# Patient Record
Sex: Female | Born: 1935
Health system: Southern US, Community
[De-identification: ages and names within clinical notes are randomized; demographics above are authoritative.]

## PROBLEM LIST (undated history)

## (undated) DIAGNOSIS — F419 Anxiety disorder, unspecified: Secondary | ICD-10-CM

## (undated) DIAGNOSIS — R112 Nausea with vomiting, unspecified: Secondary | ICD-10-CM

## (undated) DIAGNOSIS — F32A Depression, unspecified: Secondary | ICD-10-CM

## (undated) DIAGNOSIS — C50412 Malignant neoplasm of upper-outer quadrant of left female breast: Secondary | ICD-10-CM

## (undated) DIAGNOSIS — R269 Unspecified abnormalities of gait and mobility: Secondary | ICD-10-CM

## (undated) DIAGNOSIS — E785 Hyperlipidemia, unspecified: Secondary | ICD-10-CM

## (undated) DIAGNOSIS — T4145XA Adverse effect of unspecified anesthetic, initial encounter: Secondary | ICD-10-CM

## (undated) DIAGNOSIS — T8859XA Other complications of anesthesia, initial encounter: Secondary | ICD-10-CM

## (undated) DIAGNOSIS — M1990 Unspecified osteoarthritis, unspecified site: Secondary | ICD-10-CM

## (undated) DIAGNOSIS — T7840XA Allergy, unspecified, initial encounter: Secondary | ICD-10-CM

## (undated) DIAGNOSIS — R911 Solitary pulmonary nodule: Secondary | ICD-10-CM

## (undated) DIAGNOSIS — Z9889 Other specified postprocedural states: Secondary | ICD-10-CM

## (undated) DIAGNOSIS — K219 Gastro-esophageal reflux disease without esophagitis: Secondary | ICD-10-CM

## (undated) DIAGNOSIS — I480 Paroxysmal atrial fibrillation: Secondary | ICD-10-CM

## (undated) DIAGNOSIS — H269 Unspecified cataract: Secondary | ICD-10-CM

## (undated) DIAGNOSIS — Z87442 Personal history of urinary calculi: Secondary | ICD-10-CM

## (undated) DIAGNOSIS — K5790 Diverticulosis of intestine, part unspecified, without perforation or abscess without bleeding: Secondary | ICD-10-CM

## (undated) DIAGNOSIS — F329 Major depressive disorder, single episode, unspecified: Secondary | ICD-10-CM

## (undated) HISTORY — PX: ABDOMINAL HYSTERECTOMY: SHX81

## (undated) HISTORY — DX: Hyperlipidemia, unspecified: E78.5

## (undated) HISTORY — PX: OTHER SURGICAL HISTORY: SHX169

## (undated) HISTORY — DX: Paroxysmal atrial fibrillation: I48.0

## (undated) HISTORY — DX: Unspecified cataract: H26.9

## (undated) HISTORY — DX: Major depressive disorder, single episode, unspecified: F32.9

## (undated) HISTORY — DX: Depression, unspecified: F32.A

## (undated) HISTORY — PX: HERNIA REPAIR: SHX51

## (undated) HISTORY — PX: APPENDECTOMY: SHX54

## (undated) HISTORY — DX: Malignant neoplasm of upper-outer quadrant of left female breast: C50.412

## (undated) HISTORY — DX: Anxiety disorder, unspecified: F41.9

## (undated) HISTORY — DX: Diverticulosis of intestine, part unspecified, without perforation or abscess without bleeding: K57.90

## (undated) HISTORY — DX: Gastro-esophageal reflux disease without esophagitis: K21.9

## (undated) HISTORY — DX: Solitary pulmonary nodule: R91.1

## (undated) HISTORY — DX: Allergy, unspecified, initial encounter: T78.40XA

## (undated) HISTORY — PX: ESOPHAGOGASTRODUODENOSCOPY: SHX1529

## (undated) HISTORY — PX: TONSILLECTOMY: SUR1361

## (undated) HISTORY — PX: COLONOSCOPY W/ POLYPECTOMY: SHX1380

## (undated) HISTORY — DX: Unspecified abnormalities of gait and mobility: R26.9

---

## 2001-06-01 ENCOUNTER — Encounter (INDEPENDENT_AMBULATORY_CARE_PROVIDER_SITE_OTHER): Payer: Self-pay | Admitting: Specialist

## 2001-06-01 ENCOUNTER — Other Ambulatory Visit: Admission: RE | Admit: 2001-06-01 | Discharge: 2001-06-01 | Payer: Self-pay | Admitting: Gastroenterology

## 2001-06-02 ENCOUNTER — Other Ambulatory Visit: Admission: RE | Admit: 2001-06-02 | Discharge: 2001-06-02 | Payer: Self-pay | Admitting: Obstetrics and Gynecology

## 2001-06-08 ENCOUNTER — Ambulatory Visit (HOSPITAL_COMMUNITY): Admission: RE | Admit: 2001-06-08 | Discharge: 2001-06-08 | Payer: Self-pay | Admitting: Pulmonary Disease

## 2001-07-09 ENCOUNTER — Inpatient Hospital Stay (HOSPITAL_COMMUNITY): Admission: AD | Admit: 2001-07-09 | Discharge: 2001-07-11 | Payer: Self-pay | Admitting: Obstetrics and Gynecology

## 2001-12-23 HISTORY — PX: ORIF ANKLE FRACTURE: SUR919

## 2001-12-23 HISTORY — PX: ANKLE SURGERY: SHX546

## 2002-03-09 ENCOUNTER — Ambulatory Visit (HOSPITAL_COMMUNITY): Admission: RE | Admit: 2002-03-09 | Discharge: 2002-03-09 | Payer: Self-pay | Admitting: Pulmonary Disease

## 2002-08-03 ENCOUNTER — Ambulatory Visit (HOSPITAL_COMMUNITY): Admission: RE | Admit: 2002-08-03 | Discharge: 2002-08-03 | Payer: Self-pay | Admitting: Cardiology

## 2002-08-03 ENCOUNTER — Encounter: Payer: Self-pay | Admitting: Cardiology

## 2002-09-20 ENCOUNTER — Emergency Department (HOSPITAL_COMMUNITY): Admission: EM | Admit: 2002-09-20 | Discharge: 2002-09-20 | Payer: Self-pay

## 2002-09-21 ENCOUNTER — Inpatient Hospital Stay (HOSPITAL_COMMUNITY): Admission: AD | Admit: 2002-09-21 | Discharge: 2002-10-01 | Payer: Self-pay | Admitting: Orthopedic Surgery

## 2002-09-25 ENCOUNTER — Encounter: Payer: Self-pay | Admitting: Orthopedic Surgery

## 2002-09-29 ENCOUNTER — Encounter: Payer: Self-pay | Admitting: Orthopedic Surgery

## 2002-11-08 ENCOUNTER — Encounter (HOSPITAL_COMMUNITY): Admission: RE | Admit: 2002-11-08 | Discharge: 2002-12-08 | Payer: Self-pay | Admitting: Orthopedic Surgery

## 2004-10-23 ENCOUNTER — Ambulatory Visit (HOSPITAL_COMMUNITY): Admission: RE | Admit: 2004-10-23 | Discharge: 2004-10-23 | Payer: Self-pay | Admitting: Pulmonary Disease

## 2005-04-03 ENCOUNTER — Ambulatory Visit: Payer: Self-pay | Admitting: Internal Medicine

## 2005-04-15 ENCOUNTER — Ambulatory Visit (HOSPITAL_COMMUNITY): Admission: RE | Admit: 2005-04-15 | Discharge: 2005-04-15 | Payer: Self-pay | Admitting: Pulmonary Disease

## 2005-05-02 ENCOUNTER — Ambulatory Visit (HOSPITAL_COMMUNITY): Admission: RE | Admit: 2005-05-02 | Discharge: 2005-05-02 | Payer: Self-pay | Admitting: Family Medicine

## 2005-05-16 ENCOUNTER — Ambulatory Visit: Payer: Self-pay | Admitting: Internal Medicine

## 2005-06-03 ENCOUNTER — Ambulatory Visit: Payer: Self-pay | Admitting: *Deleted

## 2005-06-05 ENCOUNTER — Ambulatory Visit: Payer: Self-pay | Admitting: *Deleted

## 2005-06-05 ENCOUNTER — Ambulatory Visit (HOSPITAL_COMMUNITY): Admission: RE | Admit: 2005-06-05 | Discharge: 2005-06-05 | Payer: Self-pay | Admitting: *Deleted

## 2005-06-07 ENCOUNTER — Ambulatory Visit: Payer: Self-pay | Admitting: *Deleted

## 2005-06-17 ENCOUNTER — Ambulatory Visit: Payer: Self-pay | Admitting: *Deleted

## 2005-06-17 ENCOUNTER — Observation Stay (HOSPITAL_COMMUNITY): Admission: AD | Admit: 2005-06-17 | Discharge: 2005-06-18 | Payer: Self-pay | Admitting: Pulmonary Disease

## 2005-06-17 ENCOUNTER — Encounter (HOSPITAL_COMMUNITY): Admission: RE | Admit: 2005-06-17 | Discharge: 2005-06-19 | Payer: Self-pay | Admitting: *Deleted

## 2005-06-21 ENCOUNTER — Ambulatory Visit: Payer: Self-pay | Admitting: *Deleted

## 2005-06-26 ENCOUNTER — Ambulatory Visit: Payer: Self-pay | Admitting: Cardiology

## 2005-07-03 ENCOUNTER — Ambulatory Visit: Payer: Self-pay | Admitting: *Deleted

## 2005-07-12 ENCOUNTER — Ambulatory Visit: Payer: Self-pay | Admitting: Cardiology

## 2005-07-24 ENCOUNTER — Ambulatory Visit: Payer: Self-pay | Admitting: *Deleted

## 2005-08-07 ENCOUNTER — Ambulatory Visit: Payer: Self-pay | Admitting: *Deleted

## 2005-08-23 ENCOUNTER — Ambulatory Visit: Payer: Self-pay | Admitting: *Deleted

## 2005-08-27 ENCOUNTER — Ambulatory Visit (HOSPITAL_COMMUNITY): Admission: RE | Admit: 2005-08-27 | Discharge: 2005-08-27 | Payer: Self-pay | Admitting: *Deleted

## 2005-08-29 ENCOUNTER — Ambulatory Visit (HOSPITAL_COMMUNITY): Admission: RE | Admit: 2005-08-29 | Discharge: 2005-08-29 | Payer: Self-pay | Admitting: Cardiovascular Disease

## 2005-08-30 ENCOUNTER — Ambulatory Visit: Payer: Self-pay | Admitting: Cardiology

## 2005-09-11 ENCOUNTER — Ambulatory Visit: Payer: Self-pay | Admitting: *Deleted

## 2005-10-04 ENCOUNTER — Ambulatory Visit: Payer: Self-pay | Admitting: Cardiovascular Disease

## 2005-11-01 ENCOUNTER — Ambulatory Visit: Payer: Self-pay | Admitting: Cardiology

## 2005-11-18 ENCOUNTER — Ambulatory Visit: Payer: Self-pay | Admitting: *Deleted

## 2005-11-29 ENCOUNTER — Ambulatory Visit: Payer: Self-pay | Admitting: Cardiology

## 2005-12-31 ENCOUNTER — Ambulatory Visit: Payer: Self-pay | Admitting: Cardiology

## 2006-02-04 ENCOUNTER — Ambulatory Visit: Payer: Self-pay | Admitting: *Deleted

## 2006-03-03 ENCOUNTER — Ambulatory Visit: Payer: Self-pay | Admitting: Cardiology

## 2006-03-10 ENCOUNTER — Ambulatory Visit: Payer: Self-pay | Admitting: Internal Medicine

## 2006-04-04 ENCOUNTER — Ambulatory Visit: Payer: Self-pay | Admitting: Cardiology

## 2006-04-17 ENCOUNTER — Ambulatory Visit: Payer: Self-pay | Admitting: Cardiology

## 2006-05-14 ENCOUNTER — Ambulatory Visit: Payer: Self-pay | Admitting: *Deleted

## 2006-06-16 ENCOUNTER — Ambulatory Visit: Payer: Self-pay | Admitting: *Deleted

## 2006-06-28 IMAGING — CR DG TIBIA/FIBULA 2V*R*
3 series · 3 of 3 positions shown · non-contrast
Comparison: none

CLINICAL DATA: 76-year-old with bilateral leg pain.
 TWO VIEW LEFT TIBIA/FIBULA:
 There is no evidence of fracture or dislocation. No other significant bone or soft tissue abnormalities are identified.

[view not recorded (1 of 3)]
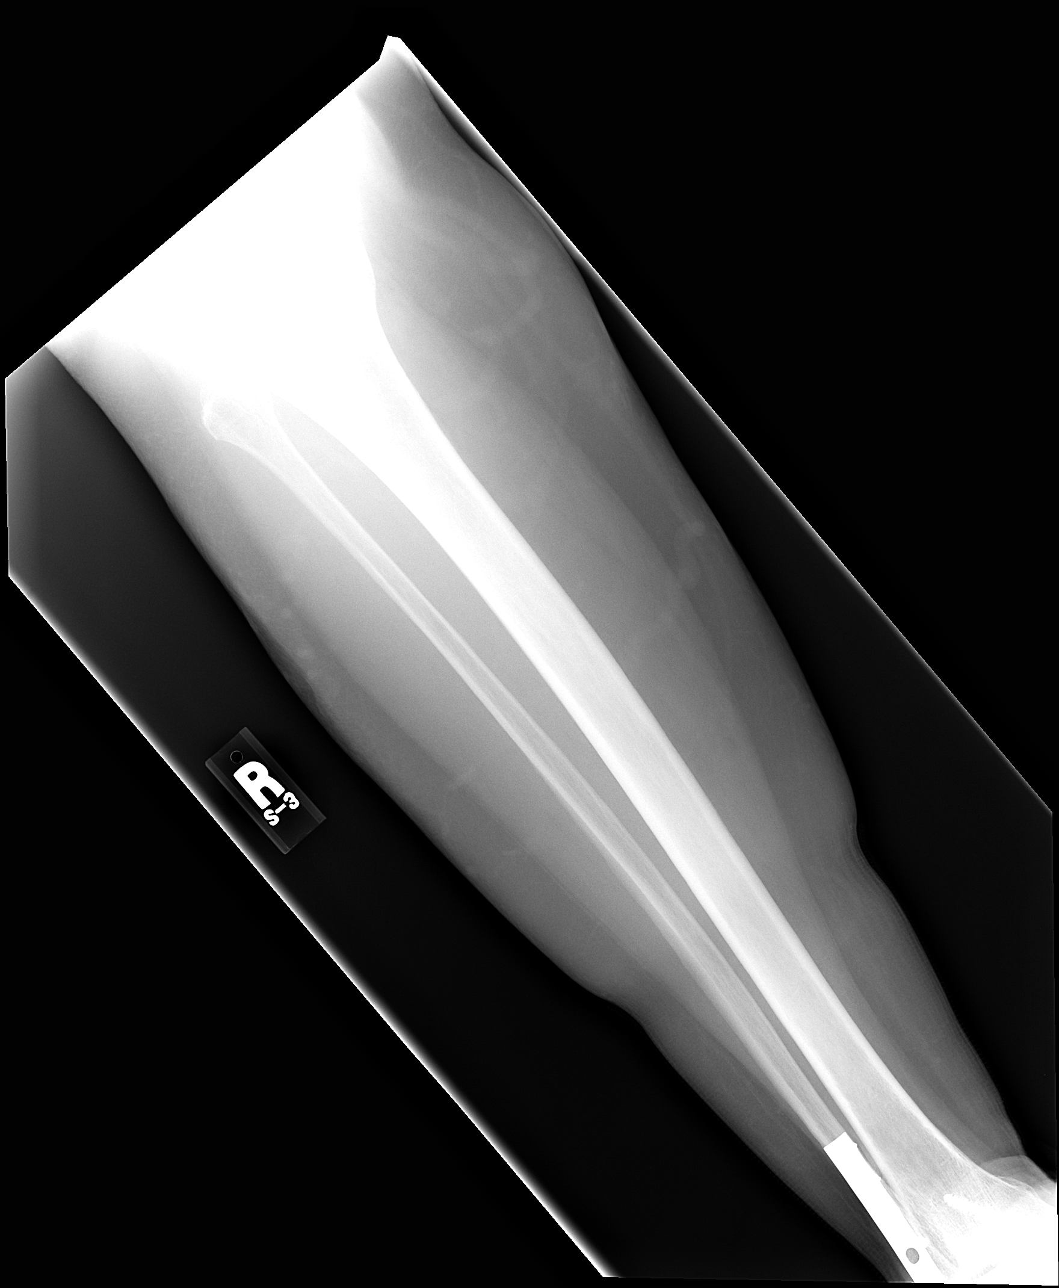

[view not recorded (2 of 3)]
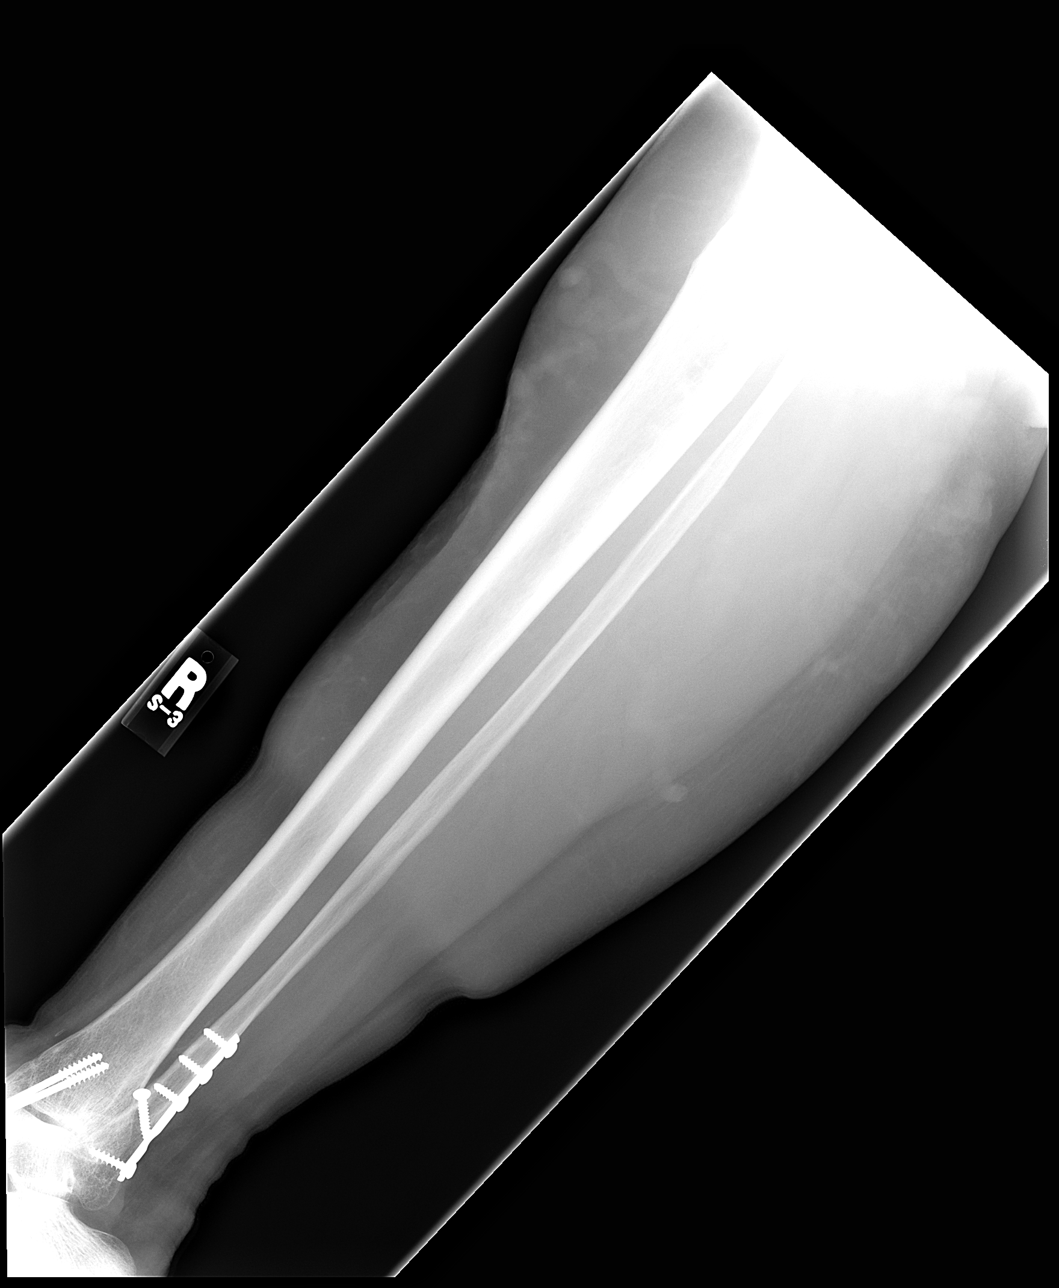

[view not recorded (3 of 3)]
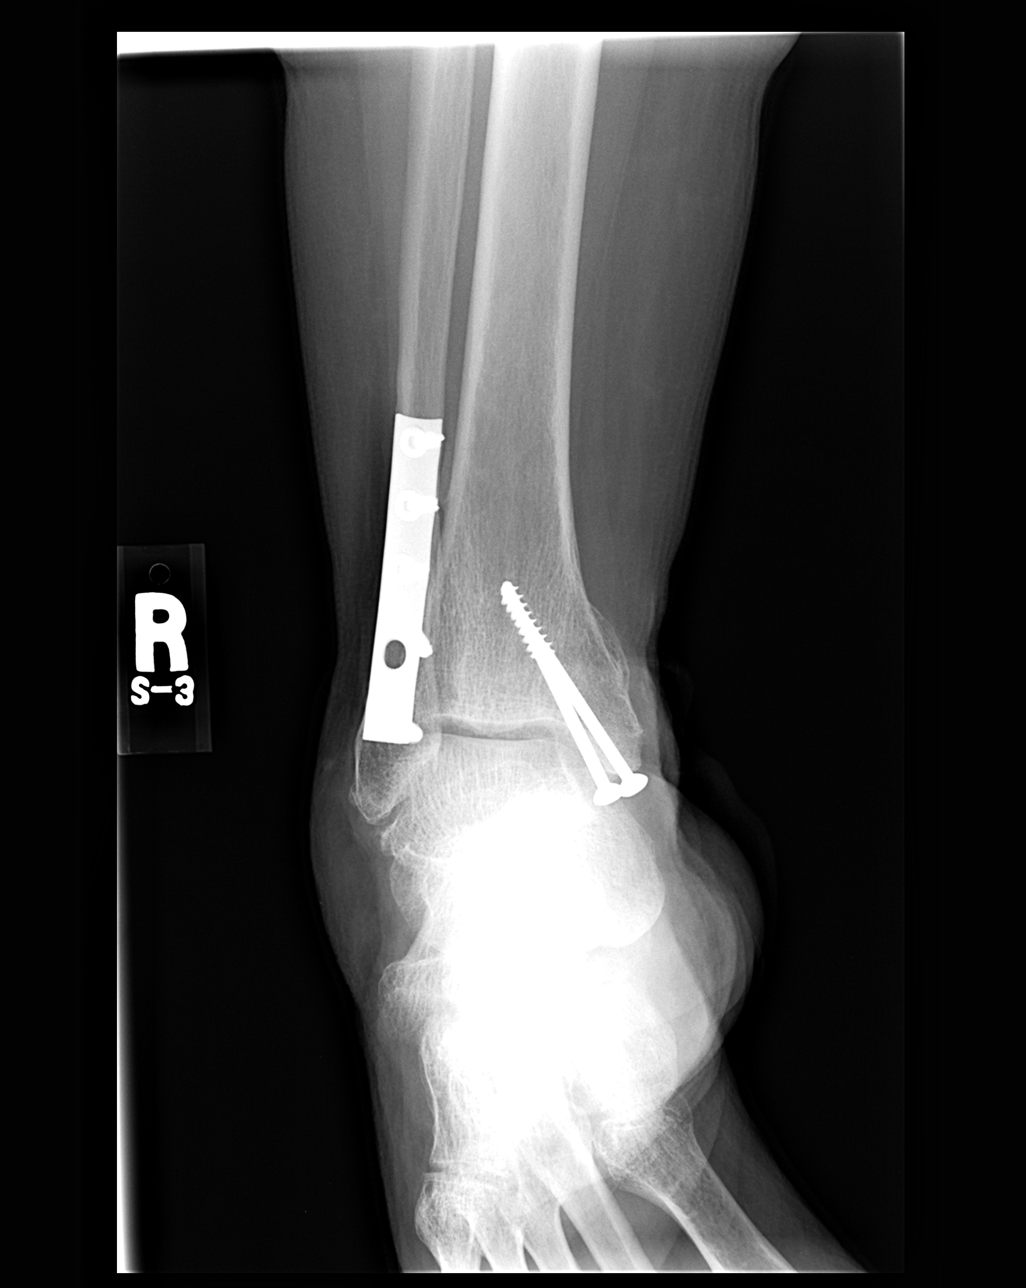

[3 of 3 positions shown; findings below may reference images not displayed]

IMPRESSION: Normal study.

 FOUR VIEW LEFT KNEE:
 Mild osteopenic changes.  There are also mild degenerative changes.  No acute bony findings or destructive bony lesions.  No joint effusion.
IMPRESSION: Mild osteopenia and mild degenerative changes but no acute bony findings.  
 TWO VIEW RIGHT TIBIA/FIBULA:
 No acute bony findings.  There is surgical hardware transfixing remote ankle fractures.
IMPRESSION: No acute bony findings.  
 FOUR VIEW RIGHT KNEE:
 Osteopenic changes.  Mild degenerative changes with medial joint space narrowing.  There is also patellofemoral joint degenerative changes.  No joint effusion and no acute bony findings.
IMPRESSION: Osteopenic changes and mild degenerative changes but no acute bony findings.

## 2006-07-21 ENCOUNTER — Ambulatory Visit: Payer: Self-pay | Admitting: *Deleted

## 2006-08-22 ENCOUNTER — Ambulatory Visit: Payer: Self-pay | Admitting: *Deleted

## 2006-09-05 ENCOUNTER — Ambulatory Visit: Payer: Self-pay | Admitting: Cardiology

## 2006-09-11 ENCOUNTER — Ambulatory Visit: Payer: Self-pay | Admitting: Cardiology

## 2006-09-11 ENCOUNTER — Ambulatory Visit (HOSPITAL_COMMUNITY): Admission: RE | Admit: 2006-09-11 | Discharge: 2006-09-11 | Payer: Self-pay | Admitting: Cardiology

## 2006-09-18 ENCOUNTER — Ambulatory Visit: Payer: Self-pay | Admitting: Cardiology

## 2006-10-20 ENCOUNTER — Ambulatory Visit: Payer: Self-pay | Admitting: Cardiology

## 2006-11-19 ENCOUNTER — Ambulatory Visit: Payer: Self-pay | Admitting: Cardiology

## 2006-12-18 ENCOUNTER — Ambulatory Visit: Payer: Self-pay | Admitting: Cardiology

## 2006-12-30 ENCOUNTER — Ambulatory Visit: Payer: Self-pay | Admitting: Internal Medicine

## 2007-01-01 ENCOUNTER — Ambulatory Visit (HOSPITAL_COMMUNITY): Admission: RE | Admit: 2007-01-01 | Discharge: 2007-01-01 | Payer: Self-pay | Admitting: Internal Medicine

## 2007-01-15 ENCOUNTER — Ambulatory Visit: Payer: Self-pay | Admitting: Cardiology

## 2007-02-20 ENCOUNTER — Emergency Department (HOSPITAL_COMMUNITY): Admission: EM | Admit: 2007-02-20 | Discharge: 2007-02-20 | Payer: Self-pay | Admitting: Emergency Medicine

## 2007-02-20 ENCOUNTER — Ambulatory Visit: Payer: Self-pay | Admitting: Cardiology

## 2007-03-11 ENCOUNTER — Ambulatory Visit: Payer: Self-pay | Admitting: Cardiology

## 2007-04-13 ENCOUNTER — Ambulatory Visit: Payer: Self-pay | Admitting: Cardiology

## 2007-04-17 ENCOUNTER — Ambulatory Visit (HOSPITAL_COMMUNITY): Admission: RE | Admit: 2007-04-17 | Discharge: 2007-04-17 | Payer: Self-pay | Admitting: Pulmonary Disease

## 2007-05-12 ENCOUNTER — Ambulatory Visit: Payer: Self-pay | Admitting: Cardiology

## 2007-06-12 ENCOUNTER — Ambulatory Visit: Payer: Self-pay | Admitting: Cardiovascular Disease

## 2007-07-10 ENCOUNTER — Ambulatory Visit: Payer: Self-pay | Admitting: Cardiology

## 2007-08-07 ENCOUNTER — Ambulatory Visit: Payer: Self-pay | Admitting: Internal Medicine

## 2007-08-21 ENCOUNTER — Ambulatory Visit: Payer: Self-pay | Admitting: Cardiology

## 2007-09-08 ENCOUNTER — Ambulatory Visit: Payer: Self-pay | Admitting: Cardiology

## 2007-10-12 ENCOUNTER — Ambulatory Visit: Payer: Self-pay | Admitting: Cardiology

## 2007-11-10 ENCOUNTER — Ambulatory Visit: Payer: Self-pay | Admitting: Cardiovascular Disease

## 2007-12-10 ENCOUNTER — Ambulatory Visit: Payer: Self-pay | Admitting: Cardiovascular Disease

## 2008-01-08 ENCOUNTER — Ambulatory Visit: Payer: Self-pay | Admitting: Internal Medicine

## 2008-02-11 ENCOUNTER — Ambulatory Visit: Payer: Self-pay | Admitting: Cardiovascular Disease

## 2008-03-11 ENCOUNTER — Ambulatory Visit: Payer: Self-pay | Admitting: Cardiology

## 2008-03-14 ENCOUNTER — Ambulatory Visit: Payer: Self-pay | Admitting: Internal Medicine

## 2008-03-14 ENCOUNTER — Ambulatory Visit (HOSPITAL_COMMUNITY): Admission: RE | Admit: 2008-03-14 | Discharge: 2008-03-14 | Payer: Self-pay | Admitting: Cardiology

## 2008-03-14 ENCOUNTER — Encounter: Payer: Self-pay | Admitting: Cardiology

## 2009-03-28 ENCOUNTER — Ambulatory Visit: Payer: Self-pay | Admitting: Vascular Surgery

## 2009-03-30 ENCOUNTER — Ambulatory Visit (HOSPITAL_COMMUNITY): Admission: RE | Admit: 2009-03-30 | Discharge: 2009-03-30 | Payer: Self-pay | Admitting: Urology

## 2009-04-10 ENCOUNTER — Ambulatory Visit (HOSPITAL_COMMUNITY): Admission: RE | Admit: 2009-04-10 | Discharge: 2009-04-10 | Payer: Self-pay | Admitting: Pulmonary Disease

## 2009-04-17 ENCOUNTER — Ambulatory Visit: Payer: Self-pay | Admitting: Vascular Surgery

## 2009-05-04 ENCOUNTER — Ambulatory Visit: Payer: Self-pay | Admitting: Cardiology

## 2009-10-26 ENCOUNTER — Ambulatory Visit (HOSPITAL_COMMUNITY): Admission: RE | Admit: 2009-10-26 | Discharge: 2009-10-26 | Payer: Self-pay | Admitting: Pulmonary Disease

## 2009-11-01 ENCOUNTER — Ambulatory Visit (HOSPITAL_COMMUNITY): Admission: RE | Admit: 2009-11-01 | Discharge: 2009-11-01 | Payer: Self-pay | Admitting: Pulmonary Disease

## 2010-03-16 ENCOUNTER — Encounter (INDEPENDENT_AMBULATORY_CARE_PROVIDER_SITE_OTHER): Payer: Self-pay

## 2010-05-26 ENCOUNTER — Encounter (INDEPENDENT_AMBULATORY_CARE_PROVIDER_SITE_OTHER): Payer: Self-pay | Admitting: *Deleted

## 2010-05-26 ENCOUNTER — Encounter: Payer: Self-pay | Admitting: Cardiology

## 2010-05-26 LAB — CONVERTED CEMR LAB
ALT: 18 units/L
ALT: 18 units/L (ref 0–35)
AST: 17 units/L
AST: 17 units/L (ref 0–37)
Albumin: 4.3 g/dL
Albumin: 4.3 g/dL (ref 3.5–5.2)
Alkaline Phosphatase: 71 units/L
Alkaline Phosphatase: 71 units/L (ref 39–117)
BUN: 22 mg/dL
BUN: 22 mg/dL (ref 6–23)
CO2: 26 meq/L
CO2: 26 meq/L (ref 19–32)
Calcium: 10.1 mg/dL
Calcium: 10.1 mg/dL (ref 8.4–10.5)
Chloride: 105 meq/L
Chloride: 105 meq/L (ref 96–112)
Cholesterol: 149 mg/dL
Cholesterol: 149 mg/dL (ref 0–200)
Creatinine, Ser: 0.78 mg/dL
Creatinine, Ser: 0.78 mg/dL (ref 0.40–1.20)
Glucose, Bld: 98 mg/dL
Glucose, Bld: 98 mg/dL (ref 70–99)
HDL: 48 mg/dL
HDL: 48 mg/dL (ref >39)
LDL Cholesterol: 17 mg/dL
LDL Cholesterol: 84 mg/dL (ref 0–99)
Potassium: 4 meq/L
Potassium: 4 meq/L (ref 3.5–5.3)
Sodium: 139 meq/L
Sodium: 139 meq/L (ref 135–145)
Total Bilirubin: 0.5 mg/dL (ref 0.3–1.2)
Total CHOL/HDL Ratio: 3.1
Total Protein: 6.9 g/dL
Total Protein: 6.9 g/dL (ref 6.0–8.3)
Triglycerides: 86 mg/dL
Triglycerides: 86 mg/dL (ref <150)
VLDL: 17 mg/dL (ref 0–40)

## 2010-06-08 ENCOUNTER — Encounter: Payer: Self-pay | Admitting: *Deleted

## 2010-06-15 ENCOUNTER — Ambulatory Visit: Payer: Self-pay | Admitting: Cardiology

## 2010-06-15 ENCOUNTER — Encounter: Payer: Self-pay | Admitting: Cardiology

## 2010-11-12 ENCOUNTER — Ambulatory Visit (HOSPITAL_COMMUNITY)
Admission: RE | Admit: 2010-11-12 | Discharge: 2010-11-12 | Payer: Self-pay | Source: Home / Self Care | Admitting: Pulmonary Disease

## 2010-12-06 ENCOUNTER — Ambulatory Visit: Payer: Self-pay | Admitting: Orthopedic Surgery

## 2010-12-10 ENCOUNTER — Encounter (HOSPITAL_COMMUNITY): Admission: RE | Admit: 2010-12-10 | Payer: Self-pay | Source: Home / Self Care | Admitting: Otolaryngology

## 2011-01-08 ENCOUNTER — Ambulatory Visit: Admit: 2011-01-08 | Payer: Self-pay | Admitting: Internal Medicine

## 2011-01-13 ENCOUNTER — Encounter: Payer: Self-pay | Admitting: *Deleted

## 2011-01-22 NOTE — Miscellaneous (Signed)
Summary: Lab orders; Lipids and CMP  Clinical Lists Changes  Problems: Added new problem of HYPERLIPIDEMIA (ICD-272.4) Added new problem of ATRIAL FIBRILLATION (ICD-427.31) Orders: Added new Test order of T-Lipid Profile [phone]) - Signed Added new Test order of T-Comprehensive Metabolic Panel [phone]) - Signed

## 2011-01-22 NOTE — Miscellaneous (Signed)
Summary: labs cmp,lipids,05/26/2010  Clinical Lists Changes  Observations: Added new observation of CALCIUM: 10.1 mg/dL (16/09/9603 5:40) Added new observation of ALBUMIN: 4.3 g/dL (98/10/9146 8:29) Added new observation of PROTEIN, TOT: 6.9 g/dL (56/21/3086 5:78) Added new observation of SGPT (ALT): 18 units/L (05/26/2010 8:42) Added new observation of SGOT (AST): 17 units/L (05/26/2010 8:42) Added new observation of ALK PHOS: 71 units/L (05/26/2010 8:42) Added new observation of CREATININE: 0.78 mg/dL (46/96/2952 8:41) Added new observation of BUN: 22 mg/dL (32/44/0102 7:25) Added new observation of BG RANDOM: 98 mg/dL (36/64/4034 7:42) Added new observation of CO2 PLSM/SER: 26 meq/L (05/26/2010 8:42) Added new observation of CL SERUM: 105 meq/L (05/26/2010 8:42) Added new observation of K SERUM: 4.0 meq/L (05/26/2010 8:42) Added new observation of NA: 139 meq/L (05/26/2010 8:42) Added new observation of LDL: 17 mg/dL (59/56/3875 6:43) Added new observation of HDL: 48 mg/dL (32/95/1884 1:66) Added new observation of TRIGLYC TOT: 86 mg/dL (06/21/1600 0:93) Added new observation of CHOLESTEROL: 149 mg/dL (23/55/7322 0:25)

## 2011-01-22 NOTE — Assessment & Plan Note (Signed)
Summary: 1 yr f/u per checkout on 05/04/09/tg  Medications Added ALPRAZOLAM 0.5 MG TABS (ALPRAZOLAM) as needed      Allergies Added: ! CODEINE ! MORPHINE  Visit Type:  Follow-up Primary Provider:  Dr. Kari Baars   History of Present Illness: [name] returns to the office as scheduled for continued assessment and treatment of a remote episode of atrial fibrillation, hyperlipidemia and generalized health concerns.  This nice woman has done extremely well since her last visit with no significant medical issues.  She is concerned about increased pedal edema.  Otherwise, she performs all of her housework and assists in the care of her husband without any cardiopulmonary symptoms.  She notes occasional brief palpitations.  She has not experienced any lightheadedness and certainly no syncope.   Current Medications (verified): 1)  Xalatan 0.005 % Soln (Latanoprost) .Marland Kitchen.. 1 Drop Each Eye At Bedtime 2)  Vytorin 10-20 Mg Tabs (Ezetimibe-Simvastatin) .... Take 1 Tab Qhs 3)  Aspirin 81 Mg Tbec (Aspirin) .... Take 1 Tab Daily 4)  Alprazolam 0.5 Mg Tabs (Alprazolam) .... As Needed  Allergies (verified): 1)  ! Codeine 2)  ! Morphine  Past History:  PMH, FH, and Social History reviewed and updated.  Past Medical History: Rate related right bundle branch block Paroxysmal atrial fibrillation-normal EF on echo in 2006; normal stress nuclear-2006; mild RV hypokinesis on MRI;      RA and RV enlargement on CT in 9/06 Anticoagulation->discontinued in 2009 Hyperlipidemia Diverticular disease Right lower lobe lung nodule-stable in 2010 Gastroesophageal reflux disease Glaucoma  Review of Systems       See history of present illness.  Vital Signs:  Patient profile:   77 year old female Height:      67 inches Weight:      167 pounds BMI:     26.25 Pulse rate:   85 / minute BP sitting:   125 / 78  (right arm)  Vitals Entered By: Dreama Saa, CNA (June 15, 2010 11:16  AM)  Physical Exam  General:  [place],well developed; no acute distress; proportionate weight and height   Neck-No JVD; minimal early systolic right carotid bruit Lungs-No tachypnea, no rales; no rhonchi; no wheezes; decreased breath sounds at the bases Cardiovascular-normal PMI; normal S1 and S2: Abdomen-BS normal; soft and non-tender without masses or organomegaly:  Musculoskeletal-No deformities, no cyanosis or clubbing: Neurologic-Normal cranial nerves; symmetric strength and tone:  Skin-Warm, no significant lesions: Extremities-1+ distal pulses; trace edema; marked surface varicosities.     Impression & Recommendations:  Problem # 1:  ATRIAL FIBRILLATION-PAROXYSMAL (ICD-427.31) Patient has had no documented recurrence in years.  Her palpitations are brief, mild and probably do not reflect a significant arrhythmia.  Continued antithrombotic therapy with aspirin alone is appropriate.  Problem # 2:  HYPERLIPIDEMIA (ICD-272.4) Recent laboratories obtained and reviewed.  Control of hyperlipidemia is excellent, and current therapy will be continued.  I will plan to see this nice woman again in one year.  Patient Instructions: 1)  Your physician recommends that you schedule a follow-up appointment in: 1 year 2)  Your physician recommends that you continue on your current medications as directed. Please refer to the Current Medication list given to you today.   Prevention & Chronic Care Immunizations   Influenza vaccine: Not documented    Tetanus booster: Not documented    Pneumococcal vaccine: Not documented    H. zoster vaccine: Not documented  Colorectal Screening   Hemoccult: Not documented    Colonoscopy: Not documented  Other Screening   Pap smear: Not documented    Mammogram: Not documented    DXA bone density scan: Not documented   Smoking status: never  (06/08/2010)  Lipids   Total Cholesterol: 149  (05/26/2010)   LDL: 84  (05/26/2010)   LDL Direct: Not  documented   HDL: 48  (05/26/2010)   Triglycerides: 86  (05/26/2010)    SGOT (AST): 17  (05/26/2010)   SGPT (ALT): 18  (05/26/2010)   Alkaline phosphatase: 71  (05/26/2010)   Total bilirubin: 0.5  (05/26/2010)  Self-Management Support :    Lipid self-management support: Not documented

## 2011-01-23 ENCOUNTER — Other Ambulatory Visit (HOSPITAL_COMMUNITY): Payer: Self-pay | Admitting: Pulmonary Disease

## 2011-01-23 DIAGNOSIS — R1031 Right lower quadrant pain: Secondary | ICD-10-CM

## 2011-01-25 ENCOUNTER — Ambulatory Visit (HOSPITAL_COMMUNITY)
Admission: RE | Admit: 2011-01-25 | Discharge: 2011-01-25 | Disposition: A | Discharge status: Home / Self Care | Payer: Medicare Other | Source: Ambulatory Visit | Attending: Pulmonary Disease | Admitting: Pulmonary Disease

## 2011-01-25 ENCOUNTER — Encounter (HOSPITAL_COMMUNITY): Payer: Self-pay

## 2011-01-25 DIAGNOSIS — N200 Calculus of kidney: Secondary | ICD-10-CM | POA: Insufficient documentation

## 2011-01-25 DIAGNOSIS — R933 Abnormal findings on diagnostic imaging of other parts of digestive tract: Secondary | ICD-10-CM | POA: Insufficient documentation

## 2011-01-25 DIAGNOSIS — R1031 Right lower quadrant pain: Secondary | ICD-10-CM | POA: Insufficient documentation

## 2011-01-25 DIAGNOSIS — K5730 Diverticulosis of large intestine without perforation or abscess without bleeding: Secondary | ICD-10-CM | POA: Insufficient documentation

## 2011-01-25 DIAGNOSIS — K7689 Other specified diseases of liver: Secondary | ICD-10-CM | POA: Insufficient documentation

## 2011-01-25 MED ORDER — IOHEXOL 300 MG/ML  SOLN
100.0000 mL | Freq: Once | INTRAMUSCULAR | Status: AC | PRN
  Administered 2011-01-25: 100 mL via INTRAVENOUS

## 2011-02-05 ENCOUNTER — Ambulatory Visit (INDEPENDENT_AMBULATORY_CARE_PROVIDER_SITE_OTHER): Payer: Medicare Other | Admitting: Internal Medicine

## 2011-02-05 DIAGNOSIS — K625 Hemorrhage of anus and rectum: Secondary | ICD-10-CM

## 2011-02-05 DIAGNOSIS — R933 Abnormal findings on diagnostic imaging of other parts of digestive tract: Secondary | ICD-10-CM

## 2011-02-05 DIAGNOSIS — Z860100 Personal history of colon polyps, unspecified: Secondary | ICD-10-CM

## 2011-02-18 NOTE — Consult Note (Addendum)
[name], Graham                ACCOUNT NO.:  [id number]  MEDICAL RECORD NO.:  [id number]           PATIENT TYPE:  LOCATION:                                FACILITY:  [facility]  PHYSICIAN:  Lionel December, M.D.    DATE OF BIRTH:  [birth year]  DATE OF CONSULTATION: DATE OF DISCHARGE:                                CONSULTATION   REASON FOR CONSULTATION:  Abdominal pain, abnormal CT.  HISTORY OF PRESENT ILLNESS:  Hailey Graham is an 77 year old female presenting today as a referral from Dr. Juanetta Gosling for abdominal pain and an abnormal CT.  She is actually has been a patient of Dr. Karilyn Cota in the past. Hailey Graham states that 3 weeks ago she had right groin pain which lasted approximately 1 day.  She states that Tylenol did ease the pain.  She continued to have right lower abdominal soreness and saw Dr. Juanetta Gosling the first of February for this.  She underwent a CT of the abdomen and pelvis with contrast on February 3 which revealed a small hepatic cyst, slightly increased in size, sigmoid diverticulosis without evidence of diverticulitis.  Questionable small gastric polyp versus focus of wall thickening or contraction, recommend endoscopy.  Tiny umbilical hernia containing fat. She tells me today that her right lower quadrant tenderness has resolved now.  She will have pain across her mid epigastric region which comes and goes.  It will last approximately 30 minutes and then resolve.  This pain is not related to bowel movements. Her appetite has remained good.  She has had no weight loss.  She has 3- 4 bowel movements a day, brown in color, normal caliber.  She does state that she saw blood on tissue twice about 2 weeks ago after having a bowel movement.  Her last colonoscopy was in 2001 by Dr. Jarold Motto.  She did have a polyp in, it was benign.  She also underwent an EGD at the same time and it was normal.  I will try to obtain those records from Dr. Jarold Motto.  ALLERGIES:  She is allergic to  CODEINE, MORPHINE and SULFA.  HOME MEDICATIONS: 1. Xalatan 0.005% 1 drop to both eyes every day. 2. Vytorin 10/20 one a day.   Her height is 5 feet 7 inches, her weight is 174, her blood pressure is 104/82, her pulse is 76.  Her oral mucosa is moist.  There are no lesions.  Her conjunctivae are pink.  Her sclerae anicteric.  Her thyroid is normal.  No cervical lymphadenopathy.  Her lungs are clear. Abdomen is soft.  Bowel sounds are positive.  She was guaiac negative today.  ASSESSMENT:  Hailey Graham is an 77 year old female presenting with an abnormal CT which revealed a 12 mm diameter questionable filling defect within the stomach which could represent a small gastric polyp, less likely focal wall thickening or contractions.  It was recommended that she have an endoscopy assessment.  She also complains of 2 episodes of blood on the toilet tissue which occurred about 2 weeks ago.  Her last colonoscopy was in 2001 by Dr. Jarold Motto with a history of a benign polyp.  RECOMMENDATIONS:  We will schedule an EGD and a colonoscopy in the near future with Dr. Karilyn Cota and the risk and benefits were reviewed with the patient and the patient is agreeable.    ______________________________ Dorene Ar, NP   ______________________________ Lionel December, M.D.    TS/MEDQ  D:  02/06/2011  T:  02/06/2011  Job:  [id number]  Electronically Signed by Dorene Ar PA on 03/08/2011 10:52:10 AM Electronically Signed by Lionel December M.D. on 04/07/2011 11:04:44 PM

## 2011-02-25 ENCOUNTER — Ambulatory Visit (INDEPENDENT_AMBULATORY_CARE_PROVIDER_SITE_OTHER): Payer: Self-pay | Admitting: Internal Medicine

## 2011-03-06 ENCOUNTER — Encounter (INDEPENDENT_AMBULATORY_CARE_PROVIDER_SITE_OTHER): Payer: Medicare Other | Admitting: Internal Medicine

## 2011-03-06 ENCOUNTER — Ambulatory Visit (HOSPITAL_COMMUNITY): Admission: RE | Admit: 2011-03-06 | Payer: Medicare Other | Source: Ambulatory Visit | Admitting: Internal Medicine

## 2011-04-11 ENCOUNTER — Ambulatory Visit (HOSPITAL_COMMUNITY): Admission: RE | Admit: 2011-04-11 | Payer: Medicare Other | Source: Ambulatory Visit | Admitting: Internal Medicine

## 2011-04-11 ENCOUNTER — Encounter (INDEPENDENT_AMBULATORY_CARE_PROVIDER_SITE_OTHER): Payer: Medicare Other | Admitting: Internal Medicine

## 2011-05-07 NOTE — Assessment & Plan Note (Signed)
OFFICE VISIT   Hailey Graham, Hailey Graham  DOB:  Apr 15, 1928                                       04/17/2009  EAVWU#:[id number]   Hailey Graham had treatment of her right small saphenous and right great  saphenous vein performed in 2007 for painful varicosities with laser  ablation of both sections.  She has done well since that time but  recently was seen at [facility] in [place] and was  evaluated by someone at that establishment and her that she had  terrible reflux and implies that she had a severe venous problem.  She  was given elastic stockings to wear and she returned our office  concerned about this.   On exam, she has no evidence of any significant recurrent varicosities.  There is no distal edema in the right leg noted.  She has intact  arterial pulses.  Blood pressure 136/86, heart rate 84, respirations 14.   Venous duplex exam today showed total closure of the right great  saphenous on the right small saphenous vein site of the previous  ablation with no evidence of deep venous obstruction.   She was reassured regarding these findings and will return to see [place] on  a p.r.n. basis   Quita Skye. Hart Rochester, M.D.  Electronically Signed   JDL/MEDQ  D:  04/17/2009  T:  04/18/2009  Job:  [id number]

## 2011-05-07 NOTE — Letter (Signed)
March 11, 2008    Edward L. Juanetta Gosling, M.D.  [address]  [place], [place] [address]   RE:  JESI, JURGENS  MRN:  [id number]  /  DOB:  1928/06/26   Dear Ed:   Ms. Kasparek returns to the office for continued assessment and treatment of  paroxysmal atrial fibrillation.  Her only episode occurred during a  pharmacologic stress test.  Even that spell only lasted a matter of  hours at which time her heart reverted back to sinus rhythm.  She had an  event recorder last year that showed frequent PACs and short runs of SVT  but nothing that even approached sustained atrial fibrillation.  She has  had no palpitations in the last year and has done fairly well.  She  continues to worry about her right heart enlargement, problem that Dr.  Dorethea Clan implied to her was of great seriousness.  She has class II  dyspnea on exertion which is chronic.   CURRENT MEDICATIONS:  1. Xalatan eye drops.  2. Vytorin 10/20 mg daily.  3. Warfarin as directed with stable and therapeutic anticoagulation.   Stool for hemoccult testing was negative last year.  Her CBC was normal  this year.   PHYSICAL EXAMINATION:  GENERAL:  A pleasant trim woman in no acute  distress.  VITAL SIGNS:  The weight is 164, 3 pounds more than in March last year.  Blood pressure  100/70, heart rate 67 with occasional prematures,  respirations 16.  NECK:  No jugular venous distention; no carotid bruits.  LUNGS:  Clear.  No pedal nor presacral edema.  ABDOMEN:  Soft and nontender; normal bowel sounds; no masses; no  organomegaly.  EXTREMITIES:  Normal distal pulses.   EKG:  Sinus rhythm with PACs; otherwise normal.  Compared with a prior  tracing of March 10, 2006, right bundle branch is no longer present.   Additional lab tests obtained recently include a good lipid profile,  normal TSH, and normal chemistry profile including normal LFTs.   IMPRESSION:  Ms. Craigie is doing beautifully.  She is willing to  discontinue Warfarin this year  and will do so.  We will repeat her  echocardiogram to determine if there is progression or regression of  right sided cardiac enlargement and if there is any indication of the  cause.  I will plan to see this nice woman again in 1 year.    Sincerely,      Gerrit Friends. Dietrich Pates, MD, [facility]  Electronically Signed    RMR/MedQ  DD: 03/11/2008  DT: 03/11/2008  Job #: [id number]

## 2011-05-07 NOTE — Procedures (Signed)
LOWER EXTREMITY VENOUS REFLUX EXAM   INDICATION:  Status post right greater saphenous vein and short  saphenous vein ablations, 2007.  Patient had limited  photoplethysmography study done at [facility] [place], which  tested positive for reflux and was told would get blood clot to lung if  did not start wearing compression stocking 24/7.   NOTE:  Patient is asymptomatic.   EXAM:  Using color-flow imaging and pulse Doppler spectral analysis, the  right common femoral, superficial femoral, popliteal, posterior tibial,  greater and lesser saphenous veins are evaluated.  There is evidence  suggesting deep venous insufficiency in the right lower extremity.   The right saphenofemoral junction is not competent at the lateral  branch, the main GSV.  Saphenofemoral junction is competent.  The right  GSV was not visualized completely, possibly due to previous ablation.   The right proximal short saphenous vein demonstrates competency.   GSV Diameter (used if found to be incompetent only)                                            Right    Left  Proximal Greater Saphenous Vein           cm       cm  Proximal-to-mid-thigh                     cm       cm  Mid thigh                                 cm       cm  Mid-distal thigh                          cm       cm  Distal thigh                              cm       cm  Knee                                      cm       cm   IMPRESSION:  1. Right greater saphenous vein appears to be ablated.  2. The deep venous system is not competent.  3. The right lesser saphenous vein is competent with evidence of      possible ablation mid calf.  4. No evidence of right lower extremity deep venous thrombosis.  5. Evidence of insufficiency in small lateral greater saphenous vein      branch and in a short dilated branch on the posterior proximal      calf.       ___________________________________________  Quita Skye. Hart Rochester,  M.D.   AS/MEDQ  D:  03/28/2009  T:  03/28/2009  Job:  [id number]

## 2011-05-07 NOTE — Letter (Signed)
May 04, 2009    Edward L. Juanetta Gosling, M.D.  [address]  [place], [place] [address]   RE:  Hailey Graham, Hailey Graham  MRN:  [id number]  /  DOB:  08-28-1928   Dear Ed:   Ms. Delapaz returns to the office for continued assessment and treatment of  dyslipidemia, right ventricular enlargement, rate-related right bundle  branch block and paroxysmal atrial fibrillation.  Since her last visit,  she has done well from a cardiac standpoint.  She has had a urinary  tract infection that cleared with antibiotics.  She also was found to  have a right lower lung nodule that requires only radiographic followup.  She was found to have diverticula on an imaging study of her abdomen.   CURRENT MEDICATIONS:  Include only Vytorin 10/20 mg daily and aspirin 81  mg daily.  She uses of the Xalatan eye drops as well.   PHYSICAL EXAMINATION:  GENERAL:  A pleasant woman, in no acute distress.  VITAL SIGNS:  The weight is 165, unchanged.  Blood pressure 110/75,  heart rate 83 and regular, respirations 14 and unlabored.  NECK:  No  jugular venous distention; no carotid bruits.  LUNGS:  Clear.  CARDIAC:  Normal first and second heart sounds; modest early systolic  ejection murmur.  ABDOMEN:  Soft and nontender; no organomegaly.  EXTREMITIES:  No edema; normal distal pulses.  NEUROLOGIC:  Normal strength and tone; normal cranial nerves.   EKG:  Normal sinus rhythm with PAC; right bundle-branch block that is  new since March 11, 2008, but unchanged when compared with March 10, 2006.   Chemistry profile is normal.  Lipid profile is excellent with a total  cholesterol of 143, triglycerides of 108, LDL of 78, and HDL of 44.   IMPRESSION:  Ms. Branden is doing beautifully.  She has had no palpitations  and no documented recurrent atrial fibrillation.  Her dyslipidemia is  extremely well-controlled.  She has a rate-related right bundle-branch  block that is probably of no consequence.  She has had mild right  ventricular  enlargement, which was unchanged on echocardiography a year  ago.  She will continue on her current medications and plan to return to  see me in 1 year.    Sincerely,      Gerrit Friends. Dietrich Pates, MD, [facility]  Electronically Signed    RMR/MedQ  DD: 05/04/2009  DT: 05/05/2009  Job #: [id number]

## 2011-05-10 NOTE — Group Therapy Note (Signed)
NAMEJENAY, Graham                ACCOUNT NO.:  [id number]   MEDICAL RECORD NO.:  [id number]          PATIENT TYPE:  INP   LOCATION:  A201                          FACILITY:  [facility]   PHYSICIAN:  Edward L. Juanetta Gosling, M.D.DATE OF BIRTH:  [birth year]   DATE OF PROCEDURE:  06/18/2005  DATE OF DISCHARGE:                                   PROGRESS NOTE   PROBLEM:  Atrial fibrillation following adenosine infusion.   SUBJECTIVE:  Hailey Graham says she is doing well. She converted back to sinus  rhythm overnight and feels very well. She has no new complaints.   OBJECTIVE:  GENERAL:  Her physical examination this point shows she is awake  and alert.  CHEST:  Her chest is clear.  HEART:  Heart is regular.  ABDOMEN:  Her abdomen is soft.  EXTREMITIES:  Showed no edema.  VITAL SIGNS:  Blood pressure 92/66, temperature 96.2, pulse 58, respirations  18.   ASSESSMENT/PLAN:  She is on Cardizem right now. I am not certain that she is  going to need that long-term or not. Will leave that decision to the  cardiology team. I expect that she will be able to go home later today. Will  discuss that with the cardiology team and discuss whether she needs any  medication or not with them as well.       ELH/MEDQ  D:  06/18/2005  T:  06/18/2005  Job:  [id number]

## 2011-05-10 NOTE — Procedures (Signed)
[name], [name]                ACCOUNT NO.:  [id number]   MEDICAL RECORD NO.:  [id number]          PATIENT TYPE:  OUT   LOCATION:  RAD                           FACILITY:  [facility]   PHYSICIAN:  Vida Roller, M.D.   DATE OF BIRTH:  Dec 26, 1927   DATE OF PROCEDURE:  06/05/2005  DATE OF DISCHARGE:                                  ECHOCARDIOGRAM   PRIMARY:  Edward L. Juanetta Gosling, M.D.   TAPE NUMBER:  [id number].   TAPE COUNT:  [id number].   This is a 77 year old woman with an abnormal electrocardiogram for LV  systolic function.   The technical quality of the study is adequate.   M-MODE TRACINGS:  The aorta is 26 mm.   The left atrium is 42 mm.   The septum is 10 mm.   The posterior wall is 10 mm.   The left ventricular diastolic dimension is 45 mm.   The left ventricular systolic dimension is 33 mm.   2-D DIMENSIONAL AND DOPPLER IMAGING:  The left ventricle is normal size with  normal systolic function.  The estimated ejection fraction is 60-65%.  There  are no wall motion abnormalities seen.   The right ventricle is enlarged with depressed RV systolic function.  Estimated right ventricular systolic function between 40-45 mmHg.   Both atria are enlarged, right greater than left.   The aortic valve is sclerotic with trace insufficiency.  No stenosis is  seen.   The mitral valve is mildly thickened with trace regurgitation.  No stenosis  is seen.   There is trace insufficiency of the pulmonary valve.   There is mild tricuspid regurgitation.   There is no pericardial effusion.   The aortic root appears to be top limits of normal above the sinuses of  Valsalva.   The inferior vena cava appears to be normal size.       JH/MEDQ  D:  06/06/2005  T:  06/06/2005  Job:  [id number]   cc:   Ramon Dredge L. Juanetta Gosling, M.D.  [address]  [place]  [place] [address]  Fax: [phone]

## 2011-05-10 NOTE — Discharge Summary (Signed)
NAMEQUIERA, DIFFEE                          ACCOUNT NO.:  [id number]   MEDICAL RECORD NO.:  [id number]                   PATIENT TYPE:  INP   LOCATION:  0456                                 FACILITY:  [facility]   PHYSICIAN:  Almedia Balls. Ranell Patrick, M.D.              DATE OF BIRTH:  [birth year]   DATE OF ADMISSION:  09/21/2002  DATE OF DISCHARGE:  10/01/2002                                 DISCHARGE SUMMARY   ADMITTING DIAGNOSES:  1. Right ankle displaced trimalleolar fracture.  2. Hypercholesterolemia.  3. Gastroesophageal reflux disease.  4. Glaucoma.   DISCHARGE DIAGNOSES:  1. Right ankle displaced trimalleolar fracture.  2. Hypercholesterolemia.  3. Gastroesophageal reflux disease.  4. Glaucoma.   CONSULTATIONS:  None.   OPERATION:  On September 29, 2002, the patient underwent open reduction  internal fixation right ankle trimalleolar fracture performed with a Synthes  small fragment set.  Dooley L. Idolina Primer, P.A.-C assisted.   BRIEF HISTORY:  This 77 year old lady had a twisting injury to the right  ankle.  This occurred outside of her home.  Immediate pain and deformity as  well as inability to stand prompted her to come to the Emergency Room of  [facility].  X-rays revealed a trimalleolar ankle fracture  dislocation.  She was placed in a well padded plaster splint and referred to  our office.  It was decided that open reduction internal fixation was  indicated.  Unfortunately, she had a considerable amount of soft tissue  trauma to this ankle at the same time she had the fracture.  Fracture  blisters were noted as well as very friable skin.  It was decided that to  incise through these blisters would compromise the healing and possibly  promote infection, so the blisters were drained under sterile conditions.  IV antibiotics were initiated.  On the morning of planned surgery, the skin  was checked and the soft tissue was in good enough shape to go ahead with  the  surgery.   COURSE IN THE HOSPITAL:  The patient tolerated the surgical procedure quite  well.  She worked with physical therapy continuing with nonweightbearing in  the right lower extremity using walker in ADLs.  She is a very friable lady  and would have difficulty in maintaining herself totally alone at home  despite her husband's presence, so it was decided that Home Health would be  indicated for the patient's safety.  This will be initiated and continue at  home.  The morning of discharge, Dr. Ranell Patrick and I removed the postoperative  splint, we examined the skin, no new lesions were seen, had good  circulation, and dorsalis pedis pulse was intact.  The toes were pink and  blanched and filled filling good capillary refill.  It was felt that she  could be maintained in her home environment after the above arrangements  were made, and she was discharged home.  LABORATORY DATA:  Laboratory values in the hospital hematologically showed a  CBC with differential completely within normal limits.  Follow up CBC was  also normal.  Blood chemistries remained normal as well.   CHEST X-RAY:  Showed no evidence for acute cardiopulmonary process.   ELECTROCARDIOGRAM:  Showed normal sinus rhythm, normal ECG.   CONDITION ON DISCHARGE:  Improved, stable.   PLAN:  The patient is discharged to her home in the care of her family as  well as home physical therapy.  She is to return to the center in ten days  where the cast will be removed and the ankle x-rayed.  She is to continue  with her home medication and diet.  Keep the right lower extremity elevated  above her heart and nonweightbearing to the right lower extremity.   MEDICATIONS AT DISCHARGE:  1. Robaxin 500 mg, #30 with a refill, one q.6h. p.r.n. muscle spasm.  2. Tylox, #40, one to two q.4-6h. p.r.n. pain.  3. One enteric-coated aspirin per day.   DISCHARGE INSTRUCTIONS:  I told her to please call should she have any  problems or  questions.     Dooley L. Shela Nevin, P.A.             Almedia Balls. Ranell Patrick, M.D.    DLU/MEDQ  D:  10/01/2002  T:  10/01/2002  Job:  [id number]   cc:   Ramon Dredge L. [place], M.D.

## 2011-05-10 NOTE — Procedures (Signed)
[place], [place]                ACCOUNT NO.:  [id number]   MEDICAL RECORD NO.:  [id number]          PATIENT TYPE:  OUT   LOCATION:  RESP                          FACILITY:  [facility]   PHYSICIAN:  Edward L. Juanetta Gosling, M.D.DATE OF BIRTH:  05-12-1928   DATE OF PROCEDURE:  DATE OF DISCHARGE:  05/02/2005                              PULMONARY FUNCTION TEST   1.  Spirometry is normal.  2.  Lung volumes are normal.  3.  Diffusion capacity of carbon monoxide is normal.      ELH/MEDQ  D:  05/08/2005  T:  05/08/2005  Job:  [id number]

## 2011-05-10 NOTE — H&P (Signed)
NAMERAE, PLOTNER                ACCOUNT NO.:  [id number]   MEDICAL RECORD NO.:  [id number]          PATIENT TYPE:  INP   LOCATION:  A201                          FACILITY:  [facility]   PHYSICIAN:  Edward L. Juanetta Gosling, M.D.DATE OF BIRTH:  1928/05/17   DATE OF ADMISSION:  06/17/2005  DATE OF DISCHARGE:  LH                                HISTORY & PHYSICAL   REASON FOR ADMISSION:  Atrial fibrillation.   HISTORY:  [place] is a 77 year old who was undergoing a stress test today  with adenosine and developed atrial fibrillation which despite use of  medications in the stress lab would not resolve.  She was therefore admitted  to start on Cardizem drip.  She was given IV Lopressor three times but  although her heart rate was reduced she did stay in atrial fibrillation.  She says she has not had any chest pain.  She feels okay.  She has no  complaints otherwise.  She had a history of shortness of breath which is why  she was undergoing the stress test but she had right bundle branch block as  well.  Before admission, she was on aspirin 81 mg daily, Vytorin 10/20  daily, Xalatan eye drops and estrogen daily.   PAST MEDICAL HISTORY:  Positive for gastroesophageal reflux disease,  hyperlipidemia, she had an ankle fracture in October 2003, glaucoma.  Echocardiogram done June 14 showed ejection fraction 65% but an enlarged  right ventricle, decreased right ventricular systolic function.   FAMILY HISTORY:  Her mother died in her 47s of unknown cause.  Father in his  34s of unknown cause.  She had a sister who died of an irregular heartbeat.   SOCIAL HISTORY:  She lives at home here in [place] with her husband.  She  does not smoke.  She does not drink any alcohol.   REVIEW OF SYSTEMS:  Review of systems except as mentioned is essentially  negative.  She did have an episode of shortness of breath that started when  she bent over and that is what started her workup.   PHYSICAL EXAMINATION:   Now, she has converted to sinus rhythm.  Her heart  rate is in the 50s, blood pressure 110/70.  Her pupils are reactive, nose  and throat are clear, mucous membranes are dry.  Her neck is supple without  masses.  Her chest is clear.  Her heart is regular without gallop.  Her  abdomen is soft; no masses felt.   ASSESSMENT AND PLAN:  Since she has converted, we will plan to go ahead with  dropping the IV Cardizem, put her on p.o. and await final cardiology  disposition.     ELH/MEDQ  D:  06/17/2005  T:  06/17/2005  Job:  [id number]

## 2011-05-10 NOTE — Consult Note (Signed)
NAMEAALIJAH, MIMS                ACCOUNT NO.:  [id number]   MEDICAL RECORD NO.:  [id number]          PATIENT TYPE:  INP   LOCATION:  A201                          FACILITY:  [facility]   PHYSICIAN:  Vida Roller, M.D.   DATE OF BIRTH:  03/25/1928   DATE OF CONSULTATION:  06/17/2005  DATE OF DISCHARGE:                                   CONSULTATION   HISTORY OF PRESENT ILLNESS:  [name] is a patient well known to me from  cardiology clinic who I saw in consultation for new onset right bundle  branch block.  Echocardiogram done at that time showed normal LV systolic  function with no significant valvular heart disease but right ventricular  enlargement with decreased right ventricular systolic pressure consistent  with mild cor pulmonale.  She was offered heart catheterization versus  adenosine Myoview, chose the adenosine Myoview and underwent infusion of  adenosine and, unfortunately, developed atrial fibrillation after the  infusion of the adenosine.  She was asymptomatic, feeling reasonably well.  She was given some IV Lopressor which decreased her heart rate but did not  convert her back to normal sinus rhythm, and she is admitted for appropriate  management.   PAST MEDICAL HISTORY:  1.  Gastroesophageal reflux disease.  2.  Hyperlipidemia.  3.  Glaucoma.  4.  She has had pulmonary function studies which are normal back in 05-21-2023 of      this year.  5.  History of ankle fracture status post repair back in May 20, 2002.   SOCIAL HISTORY:  She lives in [place] with her husband.  She is married.  She does not smoke, drink or use illicit drugs.   CURRENT MEDICATIONS:  1.  Aspirin 81 mg once a day.  2.  Vitorin 10/20 once a day.  3.  Multivitamins once a day.  4.  Estrogen.   REVIEW OF SYSTEMS:  She denies any fever or chills.  No headache, sinus  tenderness, or nasal discharge.  No changes in her voice.  No vertigo, no  photophobia.  No skin rashes.  She does have dyspnea on exertion  which is  relatively mild but no chest pain or shortness of breath.  No PND or  orthopnea.  No palpitations, presyncope, or syncope.  No cough or wheezing.  No urinary frequency or dysuria.  No weakness or numbness.  She does have  mild right ankle arthritis but no joint swelling or deformity.  She denies  any nausea, vomiting, diarrhea, bright red blood per rectum, melena,  hematemesis, dysphagia, odynophagia, GERD symptoms; however, she does have  mild constipation.  No polyuria or polydipsia.  The remainder of her Review  of Systems is negative.   PHYSICAL EXAMINATION:  VITAL SIGNS:  She is afebrile, pulse 101 and  irregular, respirations 18, blood pressure 113/63.  She weighs 160 pounds.  HEAD, EYES, EARS, NOSE, AND THROAT:  Unremarkable.  NECK:  Supple.  No jugular venous distention or carotid bruits.  Thyroid is  normal size and midline.  CARDIOVASCULAR;  Irregularly irregular with no obvious murmur.  She has no  lifts or thrills.  First and second heart sounds are normal.  LUNGS: Clear to auscultation bilaterally.  SKIN:  Exam is regular without any rashes or lesions.  BREASTS:  Complete exam was deferred.  ABDOMEN:  Soft, nontender, with normoactive bowel sounds.  EXTREMITIES:  Without clubbing, cyanosis, or edema.  Pulses are 2+  throughout.  NEUROLOGIC:  Normal.  MUSCULOSKELETAL:  Normal.   LABORATORY DATA AND OTHER STUDIES:  Chest x-ray is pending.   Electrocardiogram shows atrial fibrillation at rate of 103 with a right  bundle branch block, no ischemic ST-T wave changes, normal axes, normal  intervals.   Laboratories: White blood cell count 5.6, hemoglobin 14, hematocrit 42,  platelet count 234.  Sodium 145, potassium 3.5, chloride 112, bicarb 28, BUN  12, creatinine 0.7, and blood sugar 115.  Liver function studies were  normal.  Thyroid function studies from April are normal with lipids from  April showing total cholesterol 228, triglycerides 122, HDL 46, LDL 158.   INR 1.0, PT 12.9.   IMPRESSION:  So this is a woman with new onset atrial fibrillation with  rapid ventricular response in the setting of IV adenosine.   PLAN:  Anticoagulate her tonight and do a DC cardioversion in the morning.  I do not think she need transesophageal echocardiogram as her duration of  atrial fibrillation is relatively short.  Her dyslipidemia is being treated,  and her perfusion study is pending.  She will need long-term anticoagulation  for this problem.       JH/MEDQ  D:  06/17/2005  T:  06/17/2005  Job:  [id number]   cc:   Ramon Dredge L. [place]etta Gosling, M.D.  [address]  [place]  [place] [address]  Fax: [phone]

## 2011-05-10 NOTE — Procedures (Signed)
[place], [place]                ACCOUNT NO.:  [id number]   MEDICAL RECORD NO.:  [id number]          PATIENT TYPE:  INP   LOCATION:  A201                          FACILITY:  [facility]   PHYSICIAN:  Vida Roller, M.D.   DATE OF BIRTH:  03-Mar-1928   DATE OF PROCEDURE:  06/17/2005  DATE OF DISCHARGE:                                    STRESS TEST   [name] is a 76 year old female with no known coronary disease with  complaints of dyspnea with new right bundle branch block on  electrocardiogram. Echocardiogram done recently reveals normal LV function  with dilated right ventricle with depressed RV systolic function and  bilateral atrial enlargement.   BASELINE DATA:  Electrocardiogram reveals a sinus rhythm at 57 beats per  minute with right bundle branch block pattern. Blood pressure is 120/82.  Forty two milligrams of adenosine was infused over a 4-minute protocol with  Myoview injected at 3 minutes. The patient reported a funny feeling in her  left arm; resolved in recovery. Towards the end of the adenosine infusion,  [name] went into atrial fibrillation with rapid ventricular response. This  did not resolve within 5 minutes of stopping the adenosine. Therefore,  patient was given 5 mg IV Lopressor x3 doses. She remained in atrial  fibrillation. Therefore, she was admitted to [facility] for further  evaluation and treatment. She was asymptomatic with the atrial fibrillation.       AB/MEDQ  D:  06/17/2005  T:  06/17/2005  Job:  [id number]

## 2011-05-10 NOTE — Letter (Signed)
September 18, 2006     Kari Baars, MD  [address]  [place], [place]  [address]   RE:  ARMIYAH, CAPRON  MRN:  [id number]  /  DOB:  [birth year]   Dr. Renae Fickle:   Mrs. Jasmer returns to the office for continued assessment and treatment of a  right sided cardiac enlargement and paroxysmal atrial fibrillation.  Since  her last visit, she has continued to feel fine.  An echocardiogram was  performed.  This study did demonstrate some right sided dilatation, but no  RV dysfunction as was said to be present on her prior study.  She had no  valvular abnormalities.  A contrast study showed no intracardiac shunting.  The only aspect that could not be assessed was right ventricular systolic  pressure due to the presences of only minimal tricuspid regurgitation.   PHYSICAL EXAMINATION:  GENERAL:  Pleasant, trim woman in no acute distress.  NECK:  No jugular venous distention.  LUNGS:  Clear.  CARDIAC:  A fourth heart sound.  The pulmonic component of the second heart  sound is not increased.  There is no right ventricular lift.   IMPRESSION:  Mrs. Dupriest has minimal right sided cardiac enlargement.  There  is no evidence for significant pulmonary hypertension, no valvular disease,  and no ASD nor PFO.  I do not believe that further testing his warranted at  the present time.   She suffered one episode of atrial fibrillation.  Her risk of  thromboembolism is related only to her age.  The risks/benefits ratio of  continued anticoagulation is uncertain, but probably unfavorable.  I will  plan to perform event recording over a one month period in the near future.  If there is no atrial fibrillation, we will discontinue warfarin.   Mrs. Hewes is reminded to receive influenza vaccine in your office when  available.  I will see her again in six months at which time event recording  will be initiated.    Sincerely,      Gerrit Friends. Dietrich Pates, MD, [facility]     RMR/MedQ  DD:  09/18/2006  DT:   09/20/2006  Job #:  [id number]

## 2011-05-10 NOTE — Letter (Signed)
September 05, 2006     Edward L. Juanetta Gosling, M.D.  [address]  [place], [place] [address]   RE:  ADALIN, VANDERPLOEG  MRN:  [id number]  /  DOB:  26-Aug-1928   Dear Ed:   Ms. Kooyman was reevaluated in the office today at her request.  As you know,  this nice woman is the wife of one of my patients, Kandi Brusseau.  She  previously was followed by Dr. Dorethea Clan, who first saw her for evaluation of  dyspnea.  She was found to have right-sided cardiac enlargement, a negative  stress test, and a negative cardiac MRI.  A CT scan of the chest revealed  abnormalities of the right diaphragm, for which a follow-up study was  recommended but never obtained.  She developed atrial fibrillation during  adenosine administration, which is not a common adverse effect of that  agent.  She has been anticoagulated since but has had no documented  recurrent arrhythmia.   At the present time, she feels quite well.  She experiences no dyspnea.  Her  previous symptoms were more related to anxiety than exertion.  She has no  chest discomfort.  There is no history of a lung disease.   CURRENT MEDICATIONS:  1. Vytorin 10/20 mg daily.  2. Xalatan eye drops.  3. Warfarin as directed with stable and therapeutic anticoagulation.   PHYSICAL EXAMINATION:  GENERAL:  A trim, pleasant woman in no acute  distress.  VITAL SIGNS:  The weight is 158, 10 pounds less than 6 months ago.  Blood  pressure 100/70, heart rate 75 and regular, respirations 16.  HEENT:  Anicteric sclerae; normal lids and conjunctivae.  NECK:  No jugular venous distention; normal carotid upstrokes without  bruits.  ENDOCRINE:  No thyromegaly.  HEMATOPOIETIC:  No adenopathy.  SKIN:  No significant lesions.  PSYCHIATRIC:  Alert and oriented; normal affect.  LUNGS:  Decreased breath sounds on the left.  CARDIAC:  Normal first and second heart sounds.  Fourth heart sound present;  no lifts; modest systolic ejection murmur at the cardiac base.  ABDOMEN:  Soft  and nontender; no organomegaly; normal bowel sounds without  bruits; normal aortic pulsation.  EXTREMITIES:  No edema; normal distal pulses.  NEUROMUSCULAR:  Symmetric strength and tone.  Normal cranial nerves II-XII.  MUSCULOSKELETAL:  No joint deformities.   Prior EKG:  Normal sinus rhythm; right bundle branch block.   IMPRESSION:  Ms. Durfey has apparent right-sided cardiac enlargement with  unknown right-sided pressure and no symptoms at the present time.  She has  had a single documented episode of atrial fibrillation but does have a  notable risk factor for thromboembolism, her age and sex.  Accordingly, I  would continue treatment with warfarin until and unless she experiences  adverse effects.  We will repeat her echocardiogram.  If right-sided cardiac  enlargement is persistent or increased, consideration will be given to  additional diagnostic testing.  The CT scan that was never obtained to  follow up abnormalities at the right lung base will be performed.  I will  reassess this nice woman after the above studies have been completed.  Stool  for Hemoccult testing will also be obtained in light of chronic warfarin  therapy.  Vaccinations are up to date.    Sincerely,      Gerrit Friends. Dietrich Pates, MD, [facility]   RMR/MedQ  DD:  09/05/2006  DT:  09/06/2006  Job #:  [id number]

## 2011-05-10 NOTE — H&P (Signed)
NAMELORAL, CAMPI                          ACCOUNT NO.:  [id number]   MEDICAL RECORD NO.:  [id number]                   PATIENT TYPE:  INP   LOCATION:  0457                                 FACILITY:  [facility]   PHYSICIAN:  Almedia Balls. Ranell Patrick, M.D.              DATE OF BIRTH:  Dec 04, 1928   DATE OF ADMISSION:  09/21/2002  DATE OF DISCHARGE:                                HISTORY & PHYSICAL   CHIEF COMPLAINT:  Right ankle pain.   HISTORY OF PRESENT ILLNESS:  The patient is a pleasant 77 year old female  who is status post a fall yesterday September 20, 2002 around 6:30 in the  afternoon.  She subsequently went to the emergency room at [facility] in [place] where x-rays were taken and was found that she had a  right ankle fracture.  Her ankle fracture was reduced in the emergency  department and [facility] was called for follow-up of the  ankle fracture.  The patient subsequently saw Dr. Ranell Patrick in clinic and she  has been in turn admitted to [facility] for a planned ORIF of her  right ankle fracture later this week.   PAST MEDICAL HISTORY:  Hypercholesterolemia, gastroesophageal reflux  disease, glaucoma.   PAST SURGICAL HISTORY:  Appendectomy, hysterectomy, hernia repair,  hemorrhoid surgery, tonsils and adenoids.   MEDICATIONS:  1. Lipitor 10 mg one p.o. q.d.  2. Protonix 40 mg one p.o. q.d.  3. Xalatan eye drops per patient.  4. Baby aspirin q.d.   ALLERGIES:  CODEINE (nausea and vomiting), MORPHINE (nausea, vomiting).   SOCIAL HISTORY:  The patient denies tobacco or alcohol use.  She is married.  She lives in a [place] house with one step into the house.  Her husband  will be her care giver.   FAMILY HISTORY:  Father deceased, stroke.  Mother deceased.  No major  medical problems.   REVIEW OF SYMPTOMS:  GENERAL:  Positive night sweats.  Denies fevers,  chills, bleeding tendencies.  CNS:  Denies blurry or double vision,  seizures, headaches, paralysis.  RESPIRATORY:  Denies shortness of breath or  productive cough or hemoptysis.  CARDIOVASCULAR:  Denies chest pain, angina,  orthopnea.  GASTROINTESTINAL:  Denies nausea, vomiting, diarrhea,  constipation, melena, or bloody stools.  GENITOURINARY:  Positive for  __________.  Denies dysuria, hematuria, discharge.  MUSCULOSKELETAL:  Pertinent to HPI.   PHYSICAL EXAMINATION:  VITAL SIGNS:  Temperature 98.4, pulse 61,  respirations 20, blood pressure 126/71.  GENERAL:  Well-developed, well-nourished 77 year old female in no apparent  distress.  NECK:  Supple.  No carotid bruit noted.  CHEST:  Clear to auscultation bilaterally.  No wheezes or crackles.  HEART:  Regular rate and rhythm.  No murmurs, rubs, or gallops.  ABDOMEN:  Soft, nontender, nondistended.  Positive bowel sounds x4.  EXTREMITIES:  Right ankle tender to palpation.  Currently in splint.  Neurovascular is  intact distally including good capillary refill.  SKIN:  No rashes or lesions.  NEUROLOGIC:  Alert and oriented x3.   LABORATORIES:  Laboratories and x-rays pending.   IMPRESSION:  1. Right ankle fracture.  2. Gastroesophageal reflux disease.  3. Hypercholesterolemia.  4. Glaucoma.   PLAN:  The patient will be admitted to [facility] on September 21, 2002 for planned ORIF later in the week of her right ankle.     Clarene Reamer, P.A.-C.                   [place] Balls. Ranell Patrick, M.D.    SW/MEDQ  D:  09/21/2002  T:  09/21/2002  Job:  [id number]

## 2011-05-10 NOTE — Op Note (Signed)
   NAMEDENISA, ENTERLINE                          ACCOUNT NO.:  [id number]   MEDICAL RECORD NO.:  [id number]                   PATIENT TYPE:  INP   LOCATION:  0456                                 FACILITY:  [facility]   PHYSICIAN:  Almedia Balls. Ranell Patrick, M.D.              DATE OF BIRTH:  1928-11-21   DATE OF PROCEDURE:  09/25/2002  DATE OF DISCHARGE:                                 OPERATIVE REPORT   PREOPERATIVE DIAGNOSIS:  Right ankle fracture, trimalleolar.   POSTOPERATIVE DIAGNOSIS:  Right ankle fracture, trimalleolar.  Presence of  fracture blisters.   PROCEDURE PERFORMED:  Drainage of fracture blisters with application of  sterile dressing and closed reduction of unstable ankle fracture dislocation  with application of short leg splint.   ATTENDING SURGEON:  Almedia Balls. Ranell Patrick, M.D.   ASSISTANT:  Roma Schanz, P.A.-C.   ANESTHESIA:  Conscious sedation was utilized.   ANTIBIOTICS:  Perioperative antibiotics were given.   FLUID PLACEMENT:  200 crystalloid.   INDICATIONS:  The patient is a 77 year old female, who fell on September 20, 2002, sustaining an ankle fracture dislocation.  The patient was closed  reduced up in [place] and referred down to [facility] for evaluation.  The  patient was admitted for pain control and elevation of her leg as well as  observation for swelling due to the severe nature of her ankle injury.  The  patient presented to the operating room today for attempted ORIF, depending  on the swelling and skin conditions.   DESCRIPTION OF PROCEDURE:  After her short leg splint was removed, the  patient was noted to have extensive fracture blisters over both medial and  lateral malleoli.  There was also noted to be excessive swelling.  At this  point, a decision was made to cancel her surgery for today and under sterile  condition, her fracture blisters were unroofed gently using a sterile needle  and were drained.  At this point, a Xeroform dressing was placed  over her  fracture blisters over the medial and lateral malleoli, and her ankle was  close-reduced, and a short leg splint was applied with her foot in the  inverted, internally rotated position.  Postprocedure x-rays demonstrated  appropriate fracture alignment.  The patient was given 1 g of Ancef as well,  and her foot was elevated.  She was sent to the postanesthesia care unit in  stable condition.                                               Almedia Balls. Ranell Patrick, M.D.    SRN/MEDQ  D:  09/25/2002  T:  09/25/2002  Job:  [id number]

## 2011-05-10 NOTE — Letter (Signed)
March 11, 2007    Edward L. Juanetta Gosling, M.D.  [address]  [place], [address]   RE:  Hailey Graham, Hailey Graham  MRN:  [id number]  /  DOB:  05/26/1928   Dear Ed:   Hailey Graham returns to the office for continued assessment and treatment of  paroxysmal atrial fibrillation. Since her last visit one year ago, she  has been fine. She notes no palpitations, but she experienced no  palpitations when she was known to be in atrial fibrillation. She has  had no dyspnea. She has momentary episodes of sharp left lateral chest  discomfort. She has occasional numbness of the lateral aspect of her  right lower leg that comes and resolves spontaneously over a matter of  minutes.   Medications are unchanged from her last visit except for the addition of  Nexium 40 mg daily. Anticoagulation has been stable and therapeutic.   PHYSICAL EXAMINATION:  GENERAL:  Trim pleasant woman in no acute  distress.  VITAL SIGNS:  Weight 161, stable. Blood pressure 105/55, heart rate 68  and regular, respirations 16.  NECK:  No jugular venous distension; normal carotid upstrokes without  bruits.  LUNGS:  Clear.  CARDIAC:  Normal first and second heart sounds; fourth heart sound  present.  ABDOMEN:  Soft and nontender; no organomegaly.  EXTREMITIES:  No edema.   IMPRESSION:  Hailey Graham is doing generally well. She is reluctant to  consider discontinuation of Warfarin. We will continue to monitor INRs  and plan a return office visit in one year. A CBC and stool for  hemoccult testing are pending since she is chronically anticoagulated. I  will leave monitoring of her hyperlipidemia to your discretion.   Hailey Graham fell a few months ago when she was leaving her house resulting  in a laceration to her upper lip. She is quite clear that she tripped  over the threshold. She has not had chronic instability of gait.    Sincerely,      Gerrit Friends. Dietrich Pates, MD, [facility]  Electronically Signed    RMR/MedQ  DD: 03/11/2007  DT:  03/12/2007  Job #: [id number]

## 2011-05-10 NOTE — Op Note (Signed)
Hailey Graham, Graham                          ACCOUNT NO.:  [id number]   MEDICAL RECORD NO.:  [id number]                   PATIENT TYPE:  INP   LOCATION:  0456                                 FACILITY:  [facility]   PHYSICIAN:  Almedia Balls. Ranell Patrick, M.D.              DATE OF BIRTH:  July 06, 1928   DATE OF PROCEDURE:  09/29/2002  DATE OF DISCHARGE:                                 OPERATIVE REPORT   PREOPERATIVE DIAGNOSIS:  Right ankle trimalleolar fracture.   POSTOPERATIVE DIAGNOSIS:  Right ankle trimalleolar fracture.   PROCEDURE:  Open reduction and internal fixation of right ankle trimalleolar  fracture performed with a Synthes small fragment set.   SURGEON:  Almedia Balls. Ranell Patrick, M.D.   ASSISTANT:  Druscilla Brownie. Cherlynn June.   ANESTHESIA:  General anesthesia.   ESTIMATED BLOOD LOSS:  Zero.   TOURNIQUET TIME:  1 hour 15 minutes.   COUNTS:  Instrument counts correct.   COMPLICATIONS:  There were no complications.   ANTIBIOTICS:  IV antibiotics were given.   FLUIDS REPLACED:  1000 cc crystalloid.   INDICATIONS:  The patient is a 77 year old female who sustained a twisting  injury to her right ankle when she fell outside her house.  The patient  complained of immediate severe deformity and presented to the emergency room  with an obviously dislocated ankle.  Radiograph demonstrated a trimalleolar  ankle fracture-dislocation.  The patient was placed in a well-padded  __________ splint after reduction and was referred to orthopedics.  The  patient was attempted to have surgery approximately four days ago, at which  time it was noted that the patient had fracture blisters medially and  laterally precluding surgery.  These were drained under sterile conditions  and treated with IV antibiotics.  The patient has remained in the hospital  with her leg elevated and this morning had a wound check demonstrating  adequate soft tissue healing to allow surgery.  Risks of infection and wound  complications were discussed, as was the need to stabilize this ankle, which  was an extremely unstable fracture.  Informed consent was obtained.   DESCRIPTION OF PROCEDURE:  After an adequate level of anesthesia was  achieved, the patient was positioned supine on the operating room table.  A  nonsterile tourniquet was placed on the right proximal thigh.  The right leg  was then prepped, and draped in its entirety in the usual sterile fashion.  After elevating the leg for five minutes, the tourniquet was elevated to 300  mmHg.  A longitudinal skin incision was created over the lateral malleolus  using a 10 blade scalpel.  Dissection was carried sharply down using  atraumatic technique to the fibula, which was noted to be fractured in a  typical supination and external rotation manner.  The soft tissue was  cleaned from the fracture site, the fracture site irrigated, and the  fracture reduced manually using a  crab claw reduction clamp.  After an  adequate reduction was confirmed on both visual inspection and C-arm  fluoroscopy in multiple planes, a single anterior  to posterior lag screw  was placed in the standard AO fashion, and excellent fixation was achieved  of the fracture.  Next a posterior anti-glide plate was applied to the  fracture, as the patient had a fairly distal fibula fracture.  This was done  also in the AO fashion using three bicortical 3.5 screws proximal to the  fracture site.  There was a single fully-threaded cancellous screw placed  into the very distal screw hole of the five-hole plate to secure the distal  fragment and provide some enhanced stability.  The wound was thoroughly  irrigated and closed using 2-0 Vicryl, followed by 3-0 nylon.  Attention was  directed medially.  A small curvilinear incision was created over the  anterior medial malleolus.  Utilizing a 10 blade scalpel, subcutaneous  dissection was performed using the Metzenbaum scissors.  The saphenous  vein  was identified and protected.  The operative site was easily identified.  Periosteum and soft tissue were debrided from the fracture site, the  fracture manually reduced and held in place with a K-wire while two 4.0 45  mm malleolar screws were introduced, then using C-arm fluoroscopy to confirm  adequate reduction and hardware placement.  An excellent fixation of this  fracture was achieved.  The third malleolus, posterior malleolus, was  reduced on the lateral radiograph and not in need of fixation, as it was  less than 25% of the joint surface.  The medial wound was closed using 2-0  Vicryl and 3-0 nylon.  A sterile dressing was applied, followed by a short-  leg splint with the patient's foot in neutral.  The patient was taken in  stable condition extubated to the recovery room.                                                Almedia Balls. Ranell Patrick, M.D.    SRN/MEDQ  D:  09/29/2002  T:  09/30/2002  Job:  [id number]

## 2011-05-10 NOTE — Discharge Summary (Signed)
Hailey Graham, Hailey Graham                ACCOUNT NO.:  [id number]   MEDICAL RECORD NO.:  [id number]          PATIENT TYPE:  INP   LOCATION:  A201                          FACILITY:  [facility]   PHYSICIAN:  Hailey Graham, M.D.DATE OF BIRTH:  Feb 05, 1928   DATE OF ADMISSION:  06/17/2005  DATE OF DISCHARGE:  LH                                 DISCHARGE SUMMARY   FINAL DISCHARGE DIAGNOSES:  1.  Atrial fibrillation.  2.  Gastroesophageal reflux disease.  3.  Hyperlipidemia.  4.  Glaucoma.   This is a 77 year old who was undergoing an adenosine stress today, who  developed some atrial fibrillation which, despite receiving 5 mg of  Lopressor x3, would not convert.  She was undergoing a graded exercise test  because she has right bundle branch block and an enlarged right ventricle  with decreased right ventricular systolic function by echocardiogram.  Left  ventricular systolic function is normal.   PHYSICAL EXAMINATION:  VITAL SIGNS:  Heart rate initially was about 100 in  atrial fibrillation, blood pressure in the 90s.  CHEST:  Fairly clear.  HEART:  Irregular.  ABDOMEN:  Soft.  EXTREMITIES:  No edema.   HOSPITAL COURSE:  She was placed on a Cardizem drip, converted to sinus  rhythm within about five hours of admission, and the Cardizem drip was  discontinued.  She was switched to oral Cardizem.  Plans had been made for  her to have cardioversion if necessary, but with the change to sinus rhythm  it was felt that that was, of course not necessary.  Potassium was 3.5.  That is going to be replaced to make certain that we get her up over 4.   We will discuss the home medications with the cardiology team.  She will  continue on her regular medications at home.  The question is whether she  needs something for her burst of atrial fibrillation.  She is going to be  on:   1.  Vytorin 10/20 mg.  2.  Aspirin 81 mg daily.  3.  Xalatan eye drops.  4.  Estrogen tablets daily.   She will follow  up in my office and with the cardiologist.       ELH/MEDQ  D:  06/18/2005  T:  06/18/2005  Job:  [id number]

## 2011-05-10 NOTE — Procedures (Signed)
NAMECATHYANN, Hailey Graham                ACCOUNT NO.:  [id number]   MEDICAL RECORD NO.:  [id number]          PATIENT TYPE:  OUT   LOCATION:  RAD                           FACILITY:  [facility]   PHYSICIAN:  Gerrit Friends. Dietrich Pates, MD, FACCDATE OF BIRTH:  1928-12-21   DATE OF PROCEDURE:  09/11/2006  DATE OF DISCHARGE:                                  ECHOCARDIOGRAM   REFERRING PHYSICIANS:  Gerrit Friends. Dietrich Pates, MD and Oneal Deputy. Juanetta Gosling, M.D.   CLINICAL DATA:  A 77 year old woman with atrial fibrillation.  M-mode aorta  2.8, left atrium 4.5, septum 1.1, posterior wall 0.9, LV diastole 4.5, LV  systole 3.4.   FINDINGS:  1. Technically adequate echocardiographic study.  2. Left atrial size at the upper limit of normal; mild right atrial      enlargement.  3. Borderline right ventricular dilatation without hypertrophy and with      normal systolic function.  4. Trileaflet aortic valve; mild-to-moderate leaflet sclerosis; trivial      insufficiency.  5. Normal proximal ascending aorta.  6. Mild mitral valve thickening; trace regurgitation.  7. Normal tricuspid valve; physiologic regurgitation.  8. Normal pulmonic valve and proximal pulmonary artery.  9. Normal internal dimension, wall thickness, regional and global function      of the left ventricle.  10.Normal IVC.  11.A good-quality contrast study without evidence for intracardiac      shunting.  12.Comparison to prior study of 06/05/2005:  RV systolic function is now      normal.      Gerrit Friends. Dietrich Pates, MD, [facility]  Electronically Signed     RMR/MEDQ  D:  09/11/2006  T:  09/12/2006  Job:  [id number]

## 2011-06-19 ENCOUNTER — Other Ambulatory Visit (INDEPENDENT_AMBULATORY_CARE_PROVIDER_SITE_OTHER): Payer: Self-pay | Admitting: Internal Medicine

## 2011-06-19 ENCOUNTER — Encounter (HOSPITAL_BASED_OUTPATIENT_CLINIC_OR_DEPARTMENT_OTHER): Payer: Medicare Other | Admitting: Internal Medicine

## 2011-06-19 ENCOUNTER — Ambulatory Visit (HOSPITAL_COMMUNITY)
Admission: RE | Admit: 2011-06-19 | Discharge: 2011-06-19 | Disposition: A | Discharge status: Home / Self Care | Payer: Medicare Other | Source: Ambulatory Visit | Attending: Internal Medicine | Admitting: Internal Medicine

## 2011-06-19 DIAGNOSIS — R933 Abnormal findings on diagnostic imaging of other parts of digestive tract: Secondary | ICD-10-CM

## 2011-06-19 DIAGNOSIS — R1031 Right lower quadrant pain: Secondary | ICD-10-CM | POA: Insufficient documentation

## 2011-06-19 DIAGNOSIS — D131 Benign neoplasm of stomach: Secondary | ICD-10-CM | POA: Insufficient documentation

## 2011-06-19 DIAGNOSIS — K2940 Chronic atrophic gastritis without bleeding: Secondary | ICD-10-CM | POA: Insufficient documentation

## 2011-06-19 DIAGNOSIS — D175 Benign lipomatous neoplasm of intra-abdominal organs: Secondary | ICD-10-CM | POA: Insufficient documentation

## 2011-06-19 DIAGNOSIS — E785 Hyperlipidemia, unspecified: Secondary | ICD-10-CM | POA: Insufficient documentation

## 2011-07-02 NOTE — Op Note (Signed)
NAMEQUANIKA, SOLEM                ACCOUNT NO.:  [id number]  MEDICAL RECORD NO.:  [id number]  LOCATION:  DAYP                          FACILITY:  [facility]  PHYSICIAN:  Lionel December, M.D.    DATE OF BIRTH:  August 31, 1928  DATE OF PROCEDURE:  06/19/2011 DATE OF DISCHARGE:                              OPERATIVE REPORT   PROCEDURE:  Esophagogastroduodenoscopy.  INDICATIONS:  Lucciana is an 77 year old Caucasian female who was seen in emergency room back in February of this year for a right lower quadrant abdominal pain.  She had abdominopelvic CT while it did not show any source of pain.  She was noted to have 12-mm filling defect in the proximal stomach and a followup was advised.  She has seen Dr. Juanetta Gosling since then. He also felt that this area which should be looked at.  She denies epigastric pain, melena or rectal bleeding, chronic heartburn, nausea, vomiting, anorexia and weight loss.  Procedure risks were reviewed with the patient and informed consent was obtained.  MEDICATIONS FOR CONSCIOUS SEDATION: 1. Cetacaine spray for pharyngeal topical anesthesia. 2. Demerol 50 mg IV. 3. Versed 4 mg IV.  FINDINGS:  Procedure performed in endoscopy suite.  The patient's vital signs and O2 sats were monitored during the procedure and remained stable.  The patient was placed in left lateral recumbent position and Pentax videoscope was passed through oropharynx without any difficulty into esophagus.  Esophagus.  Mucosa of the esophagus normal.  GE junction was located at 38-cm from the incisors.  There was edema and erythema to the gastric site along the Z-line.  Stomach other than small amount of bile it was empty.  Stomach distended very well with insufflation.  Folds of the proximal stomach are normal. There were few hyperplastic-appearing polyps at gastric body.  These are 4-5 mm, two of these were biopsied for histology.  There were 2 submucosal lesions along the lesser curvature at  body.  The proximal one was about 10 x 8 mm and corresponded to CT finding.  The distal lesion was much smaller.  Proximal lesion was palpated with biopsy forceps and was somewhat firm.  There were few antral erosions.  No ulcer crater was noted.  Pyloric channel was patent.  Angularis, fundus and cardia were examined by retroflexing scope and were normal.  Duodenum.  In the bulb, there was some nodularity and mucosa felt to be secondary to prune overgrown gland hypertrophy.  Scope was passed into second part of the duodenum where mucosa and folds were normal. However, there was a single 6-7 mm submucosal lesion with yellowish discoloration felt to be a small lipoma.  Pictures were taken for the record.  Endoscope was withdrawn.  The patient tolerated the procedure well.  FINAL DIAGNOSES: 1. Two small submucosal lesions at gastric body along the lesser     curvature.  Proximal of these 2  lesions is about 10-mm in maximal     diameter and corresponds to CT finding.  I suspect this is a     leiomyoma.  There is a smaller one just distal to it not seen on     CT. 2. Few hyperplastic-appearing gastric polyps at  body two of which were     biopsied for histology. 3. Erosive antral gastritis. 4. Small lipoma involving second part of the duodenum.  RECOMMENDATIONS: 1. Antireflux measures. 2. Pantoprazole 40 mg p.o. daily for 8 weeks and thereafter on p.r.n.     basis. 3. I will be contacting the patient with results of biopsy and further     recommendations if any.          ______________________________ Lionel December, M.D.     NR/MEDQ  D:  06/19/2011  T:  06/20/2011  Job:  [id number]  cc:   Ramon Dredge L. Juanetta Gosling, M.D. Fax: [phone]  Electronically Signed by Lionel December M.D. on 07/02/2011 09:50:33 PM

## 2011-07-17 ENCOUNTER — Encounter: Payer: Self-pay | Admitting: Adult Health

## 2011-07-17 ENCOUNTER — Ambulatory Visit (INDEPENDENT_AMBULATORY_CARE_PROVIDER_SITE_OTHER): Payer: Medicare Other | Admitting: Adult Health

## 2011-07-17 VITALS — BP 108/72 | HR 79 | Ht 67.0 in | Wt 159.0 lb

## 2011-07-17 DIAGNOSIS — I4891 Unspecified atrial fibrillation: Secondary | ICD-10-CM

## 2011-07-17 DIAGNOSIS — I480 Paroxysmal atrial fibrillation: Secondary | ICD-10-CM

## 2011-07-17 NOTE — Assessment & Plan Note (Signed)
She is stable at this time without complaint. She remains in NSR. She is no longer on coumadin, as a cardionet monitor was placed and she had no further episodes of afib.  She has not had a stress myoview in 6 years. I have suggested that we repeat this. She is a little anxious about persuing this as she had afib the last time.  She would like to postpone this until her husband is completely well, in case she needs further testing or cath. Since she is asymptomatic, I will wait on her to tell us when she is ready. However, she is to call us if she experiences chest pain or any other symptoms. She verbalizes understanding. No medication changes at this time.

## 2011-07-17 NOTE — Patient Instructions (Signed)
You will follow up in 1 year with Dr. Dietrich Pates. You will get a letter in the mail about 1-2 months in advance. If you don't receive this letter,contact our office. Continue your current medications.

## 2011-07-17 NOTE — Progress Notes (Signed)
HPI:  Hailey Graham is a very pleasant 60- y/o patient of Dr. Dietrich Pates we are seeing for ongoing assessment and treatment of PAF.  She is without complaint of palpitations, dyspnea, dizziness. She has occasional aches and pains, but nothing severe. She has had no hospitalizations, ER visits or new diagnosis. She is caring for her husband who has had recent hip surgery which is a little tiring to her, but she is doing well otherwise. She says that she cannot tell when her heart rate is irregular. She could not tell in the past when it occurred.  She states it was found during a stress test with Dr. Dorethea Clan.  Allergies  Allergen Reactions  . Codeine   . Morphine   . Sulfa Drugs Cross Reactors     Current Outpatient Prescriptions  Medication Sig Dispense Refill  . acetic acid-hydrocortisone (VOSOL-HC) otic solution as directed.       Marland Kitchen ALPRAZolam (XANAX) 0.5 MG tablet Take 0.5 mg by mouth as needed. Uses 1-2 per week sometimes every 2-3 weeks       . ezetimibe-simvastatin (VYTORIN) 10-20 MG per tablet Take 1 tablet by mouth at bedtime.        Marland Kitchen latanoprost (XALATAN) 0.005 % ophthalmic solution Place 1 drop into both eyes at bedtime.        . pantoprazole (PROTONIX) 40 MG tablet Take 40 mg by mouth daily.          Past Medical History  Diagnosis Date  . Atrial fibrillation   . GERD (gastroesophageal reflux disease)   . Hyperlipidemia   . Glaucoma     Past Surgical History  Procedure Date  . Esophagogastroduodenoscopy     ZOX:WRUEAV of systems complete and found to be negative unless listed above PHYSICAL EXAM BP 108/72  Pulse 79  Ht 5\' 7"  (1.702 m)  Wt 159 lb (72.122 kg)  BMI 24.90 kg/m2 General: Well developed, well nourished, in no acute distress Head: Eyes PERRLA, No xanthomas.   Normal cephalic and atramatic  Lungs: Clear bilaterally to auscultation and percussion. Heart: HRRR S1 S2,.  Pulses are 2+ & equal.            No carotid bruit. No JVD.  No abdominal bruits. No femoral  bruits. Abdomen: Bowel sounds are positive, abdomen soft and non-tender without masses or                  Hernia's noted. Msk:  Back normal, normal gait. Normal strength and tone for age. Extremities: No clubbing, cyanosis or edema.  DP +1 Neuro: Alert and oriented X 3. Psych:  Good affect, responds appropriately  EKG: NSR with RBBB. Anterior infarct, rate of 73 bpm. ASSESSMENT AND PLAN

## 2011-08-30 ENCOUNTER — Telehealth: Payer: Self-pay | Admitting: Adult Health

## 2011-08-30 MED ORDER — EZETIMIBE-SIMVASTATIN 10-20 MG PO TABS: 1.0000 | ORAL_TABLET | Freq: Every day | ORAL | Status: DC

## 2011-08-30 NOTE — Telephone Encounter (Signed)
Patient needs vytorin 10/20mg  called in to [facility]

## 2011-11-29 ENCOUNTER — Other Ambulatory Visit (HOSPITAL_COMMUNITY): Payer: Self-pay | Admitting: Orthopedic Surgery

## 2011-11-29 DIAGNOSIS — M25561 Pain in right knee: Secondary | ICD-10-CM

## 2011-12-03 ENCOUNTER — Ambulatory Visit (HOSPITAL_COMMUNITY)
Admission: RE | Admit: 2011-12-03 | Discharge: 2011-12-03 | Disposition: A | Discharge status: Home / Self Care | Payer: Medicare Other | Source: Ambulatory Visit | Attending: Orthopedic Surgery | Admitting: Orthopedic Surgery

## 2011-12-03 DIAGNOSIS — M25561 Pain in right knee: Secondary | ICD-10-CM

## 2011-12-03 DIAGNOSIS — M25469 Effusion, unspecified knee: Secondary | ICD-10-CM | POA: Insufficient documentation

## 2011-12-03 DIAGNOSIS — M659 Unspecified synovitis and tenosynovitis, unspecified site: Secondary | ICD-10-CM | POA: Insufficient documentation

## 2011-12-03 DIAGNOSIS — M23359 Other meniscus derangements, posterior horn of lateral meniscus, unspecified knee: Secondary | ICD-10-CM | POA: Insufficient documentation

## 2011-12-03 DIAGNOSIS — M25569 Pain in unspecified knee: Secondary | ICD-10-CM | POA: Insufficient documentation

## 2012-02-11 ENCOUNTER — Other Ambulatory Visit: Payer: Self-pay | Admitting: *Deleted

## 2012-02-11 DIAGNOSIS — I83893 Varicose veins of bilateral lower extremities with other complications: Secondary | ICD-10-CM

## 2012-02-17 ENCOUNTER — Encounter: Payer: Self-pay | Admitting: Vascular Surgery

## 2012-02-18 ENCOUNTER — Encounter (INDEPENDENT_AMBULATORY_CARE_PROVIDER_SITE_OTHER): Payer: Medicare Other | Admitting: *Deleted

## 2012-02-18 ENCOUNTER — Ambulatory Visit (INDEPENDENT_AMBULATORY_CARE_PROVIDER_SITE_OTHER): Payer: Medicare Other | Admitting: Vascular Surgery

## 2012-02-18 ENCOUNTER — Encounter: Payer: Self-pay | Admitting: Vascular Surgery

## 2012-02-18 VITALS — BP 139/86 | HR 74 | Resp 20 | Ht 67.0 in | Wt 156.0 lb

## 2012-02-18 DIAGNOSIS — I8310 Varicose veins of unspecified lower extremity with inflammation: Secondary | ICD-10-CM

## 2012-02-18 DIAGNOSIS — I83893 Varicose veins of bilateral lower extremities with other complications: Secondary | ICD-10-CM

## 2012-02-18 NOTE — Progress Notes (Signed)
Subjective:     Patient ID: Hailey Graham, female   DOB: 1928-05-23, 78 y.o.   MRN: [id number]  HPI this 78 year old female returns with recurrent varicose veins in the right lower extremity. She has previously had laser ablation of the right great saphenous vein and small saphenous vein in 2007 with a good result. Over the past year or so she has developed recurrent varicosities in the right anterior thigh extending down into the pretibial region. She has also developed worsening edema in the right leg with increasing discomfort despite wearing long lag clastic compression stockings 20-30 mm gradient. She is elevating her legs frequently and taking ibuprofen with no result. Her left leg has no symptoms. She has had no stasis ulcers or DVT or thrombophlebitis.  Past Medical History  Diagnosis Date  . Atrial fibrillation   . GERD (gastroesophageal reflux disease)   . Hyperlipidemia   . Glaucoma     History  Substance Use Topics  . Smoking status: Never Smoker   . Smokeless tobacco: Never Used  . Alcohol Use: No    No family history on file.  Allergies  Allergen Reactions  . Codeine   . Morphine   . Sulfa Drugs Cross Reactors     Current outpatient prescriptions:ALPRAZolam (XANAX) 0.5 MG tablet, Take 0.5 mg by mouth as needed. Uses 1-2 per week sometimes every 2-3 weeks , Disp: , Rfl: ;  ezetimibe-simvastatin (VYTORIN) 10-20 MG per tablet, Take 1 tablet by mouth at bedtime., Disp: 30 tablet, Rfl: 6;  latanoprost (XALATAN) 0.005 % ophthalmic solution, Place 1 drop into both eyes at bedtime.  , Disp: , Rfl:  pantoprazole (PROTONIX) 40 MG tablet, Take 40 mg by mouth daily.  , Disp: , Rfl: ;  acetic acid-hydrocortisone (VOSOL-HC) otic solution, as directed. , Disp: , Rfl:   BP 139/86  Pulse 74  Resp 20  Ht 5\' 7"  (1.702 m)  Wt 156 lb (70.761 kg)  BMI 24.43 kg/m2  Body mass index is 24.43 kg/(m^2).         Review of Systems denies chest pain, dyspnea on exertion, PND,  orthopnea, claudication, or any neurologic symptoms.     Objective:   Physical Exam pressure 139/86 heart rate 74 respirations 20 Gen. well-developed well-nourished female in no apparent distress alert and oriented x3 Lungs no rhonchi or wheezing Cardiovascular regular rhythm no murmurs carotid pulses 3+ no audible bruits HEENT normal for age Abdomen soft nontender with no masses Lower extremities 3+ femoral dorsalis pedis pulses palpable bilaterally. She has a prominent varix beginning in the mid thigh the right leg extending down the anterior thigh in to the pretibial region. She has 1-2+ edema distally. No evidence of active ulceration or hyperpigmentation.  Today I ordered a venous duplex exam the right leg which are reviewed and interpreted. The deep system has mild reflux. The great saphenous vein on the right has been ablated. There is an antral lateral branch which is widely patent and is supplying these bulging varicosities in the anterior thigh.     Assessment:     Reflux right great saphenous vein-anterolateral branch supplying painful varicosities right leg    Plan:     Believe we should proceed with #1 laser ablation right great saphenous vein #2 return in 3 months later to see if stab phlebectomy necessary for secondary varicosities The patient has failed conservative management wearing stockings-long-leg 20-30 mm gradient and trying elevation and pain medication for past several months with no success

## 2012-02-24 NOTE — Procedures (Unsigned)
LOWER EXTREMITY VENOUS REFLUX EXAM  INDICATION:  Right lower extremity varicose veins.  EXAM:  Using color-flow imaging and pulse Doppler spectral analysis, the right common femoral, femoral, popliteal, posterior tibial, greater and lesser saphenous veins are evaluated.  There is evidence suggesting deep venous insufficiency in the right lower extremity.  The right saphenofemoral junction is not competent with Reflux of >531milliseconds. The right GSV is not competent with Reflux of >552milliseconds with the caliber as described below.   The right proximal small saphenous vein demonstrates competency.  GSV Diameter (used if found to be incompetent only)                                           Right    Left Proximal Greater Saphenous Vein           0.56 cm  cm Proximal-to-mid-thigh                     0.38 cm  cm Mid thigh                                 0.34 cm  cm Mid-distal thigh                          cm       cm Distal thigh                              0.32 cm  cm Knee                                      0.33 cm  cm  IMPRESSION: 1. The right greater saphenous vein is not competent with Reflux of     >582milliseconds identified with the caliber ranging from ______ cm     to _______ cm knee to groin. 2. The right greater saphenous vein is tortuous. 3. The deep venous system is not competent with Reflux of     >550milliseconds. 4. The right small saphenous vein is competent with Reflux of     >537milliseconds.)  ___________________________________________ Quita Skye. Hart Rochester, M.D.  EM/MEDQ  D:  02/18/2012  T:  02/18/2012  Job:  [id number]

## 2012-03-03 ENCOUNTER — Other Ambulatory Visit: Payer: Self-pay | Admitting: *Deleted

## 2012-03-03 ENCOUNTER — Ambulatory Visit: Payer: Medicare Other | Admitting: Vascular Surgery

## 2012-03-03 DIAGNOSIS — I83893 Varicose veins of bilateral lower extremities with other complications: Secondary | ICD-10-CM

## 2012-03-12 ENCOUNTER — Telehealth: Payer: Self-pay | Admitting: *Deleted

## 2012-03-12 NOTE — Telephone Encounter (Signed)
Responded back to Mrs. Basnett from a voice message she left about rescheduling her vein procedure (laser ablation)which is scheduled for Monday, 03-16-2012. Mrs. Seward was wanting to know availability for Monday, April 1.  I checked Dr. Candie Chroman schedule and there was no availability as there are 4 cases scheduled that day.  Mrs. Amedee voiced understanding and states she will keep her scheduled appointment for 03-16-2012 at 11 AM.  Dalisha Shively, Neena Rhymes

## 2012-03-13 ENCOUNTER — Encounter: Payer: Self-pay | Admitting: Vascular Surgery

## 2012-03-16 ENCOUNTER — Ambulatory Visit (INDEPENDENT_AMBULATORY_CARE_PROVIDER_SITE_OTHER): Payer: Medicare Other | Admitting: Vascular Surgery

## 2012-03-16 ENCOUNTER — Encounter: Payer: Self-pay | Admitting: Vascular Surgery

## 2012-03-16 VITALS — BP 134/85 | HR 75 | Resp 18 | Ht 67.0 in | Wt 155.0 lb

## 2012-03-16 DIAGNOSIS — I83893 Varicose veins of bilateral lower extremities with other complications: Secondary | ICD-10-CM

## 2012-03-16 NOTE — Progress Notes (Signed)
Subjective:     Patient ID: Hailey Graham, female   DOB: [birth year], 78 y.o.   MRN: [id number]  HPI this 78 year old female had laser ablation of the anterior accessory branch of the right great saphenous vein performed under local tumescent anesthesia today. She tolerated the procedure well. A total of the thousand and 7 J of energy was utilized.   Review of Systems     Objective:   Physical ExamBP 134/85  Pulse 75  Resp 18  Ht 5\' 7"  (1.702 m)  Wt 155 lb (70.308 kg)  BMI 24.28 kg/m2       Assessment:    well-tolerated laser ablation anterior chest through branch right great saphenous vein performed under local tumescent anesthesia for painful varicosities    Plan:     Return in one week for venous duplex exam to confirm closure of the anterior accessory branch right great saphenous vein

## 2012-03-16 NOTE — Progress Notes (Signed)
Laser Ablation Procedure      Date: 03/16/2012    Hailey Graham DOB:[birth year]  Consent signed: Yes  Surgeon:J.D. Hart Rochester  Procedure: Laser Ablation: right Greater Saphenous Vein  BP 134/85  Pulse 75  Resp 18  Ht 5\' 7"  (1.702 m)  Wt 155 lb (70.308 kg)  BMI 24.28 kg/m2  Start time: 11   End time: 11:30  Tumescent Anesthesia: 250 cc 0.9% NaCl with 50 cc Lidocaine HCL with 1% Epi and 15 cc 8.4% NaHCO3  Local Anesthesia: 8 cc Lidocaine HCL and NaHCO3 (ratio 2:1)  Pulsed mode:15 Watts 1 Seconds 1 Pulses: Total Pulses:68 Total Energy: 1007 Total Time: 1:07   Patient tolerated procedure well: Yes  Description of Procedure:  After marking the course of the saphenous vein and the secondary varicosities in the standing position, the patient was placed on the operating table in the supine position, and the right leg was prepped and draped in sterile fashion. Local anesthetic was administered, and under ultrasound guidance the saphenous vein was accessed with a micro needle and guide wire; then the micro puncture sheath was placed. A guide wire was inserted to the saphenofemoral junction, followed by a 5 french sheath.  The position of the sheath and then the laser fiber below the junction was confirmed using the ultrasound and visualization of the aiming beam.  Tumescent anesthesia was administered along the course of the saphenous vein using ultrasound guidance. Protective laser glasses were placed on the patient, and the laser was fired at 15 watt pulsed mode advancing 1-2 mm per sec.  For a total of 1007 joules.  A steri strip was applied to the puncture site.   ABD pads and thigh high compression stockings were applied.  Ace wrap bandages were applied over the phlebectomy sites and at the top of the saphenofemoral junction.  Blood loss was less than 15 cc.  The patient ambulated out of the operating room having tolerated the procedure well.

## 2012-03-17 ENCOUNTER — Encounter: Payer: Self-pay | Admitting: Vascular Surgery

## 2012-03-17 ENCOUNTER — Telehealth: Payer: Self-pay | Admitting: *Deleted

## 2012-03-17 NOTE — Telephone Encounter (Signed)
Reached patient at home. Having no pain or problems. Follwing all instructions. Reminded her of her fu appts. Next week.

## 2012-03-23 ENCOUNTER — Encounter: Payer: Self-pay | Admitting: Vascular Surgery

## 2012-03-24 ENCOUNTER — Ambulatory Visit (INDEPENDENT_AMBULATORY_CARE_PROVIDER_SITE_OTHER): Payer: Medicare Other | Admitting: Vascular Surgery

## 2012-03-24 ENCOUNTER — Encounter: Payer: Self-pay | Admitting: Vascular Surgery

## 2012-03-24 VITALS — BP 145/85 | HR 72 | Resp 18 | Ht 67.0 in | Wt 155.0 lb

## 2012-03-24 DIAGNOSIS — I83893 Varicose veins of bilateral lower extremities with other complications: Secondary | ICD-10-CM

## 2012-03-24 DIAGNOSIS — Z48812 Encounter for surgical aftercare following surgery on the circulatory system: Secondary | ICD-10-CM

## 2012-03-24 NOTE — Progress Notes (Signed)
Subjective:     Patient ID: Hailey Graham, female   DOB: 1928-10-01, 78 y.o.   MRN: [id number]  HPI this 78 year old female returns for followup regarding her laser ablation of the right great saphenous vein anterior accessory branch which was performed one week ago. She had painful varicosities and distal edema in the right lower extremity. She tolerated the procedure she has had no pain in the proximal thigh and groin area. She states that the edema in the right leg is improved and her stocking goes on easier than before. She states that the bulge is in her right anterior thigh are much less prominent. She is having less discomfort.  Past Medical History  Diagnosis Date  . Atrial fibrillation   . GERD (gastroesophageal reflux disease)   . Hyperlipidemia   . Glaucoma     History  Substance Use Topics  . Smoking status: Never Smoker   . Smokeless tobacco: Never Used  . Alcohol Use: No    No family history on file.  Allergies  Allergen Reactions  . Codeine   . Morphine   . Sulfa Drugs Cross Reactors     Current outpatient prescriptions:acetic acid-hydrocortisone (VOSOL-HC) otic solution, as directed. , Disp: , Rfl: ;  ALPRAZolam (XANAX) 0.5 MG tablet, Take 0.5 mg by mouth as needed. Uses 1-2 per week sometimes every 2-3 weeks , Disp: , Rfl: ;  ezetimibe-simvastatin (VYTORIN) 10-20 MG per tablet, Take 1 tablet by mouth at bedtime., Disp: 30 tablet, Rfl: 6 latanoprost (XALATAN) 0.005 % ophthalmic solution, Place 1 drop into both eyes at bedtime.  , Disp: , Rfl: ;  pantoprazole (PROTONIX) 40 MG tablet, Take 40 mg by mouth daily.  , Disp: , Rfl:   BP 145/85  Pulse 72  Resp 18  Ht 5\' 7"  (1.702 m)  Wt 155 lb (70.308 kg)  BMI 24.28 kg/m2  Body mass index is 24.28 kg/(m^2).        Review of Systems denies chest pain, dyspnea on exertion, PND, orthopnea    Objective:   Physical Exam blood pressure 140/85 heart rate 60 respirations 18 Chest clear no rhonchi Cardiovascular  regular rhythm no murmurs carotid pulses 3+ no audible bruits Lower extremity exam reveals minimal discomfort along the course of the anterior accessory branch of the great saphenous vein. There is minimal edema distally. Bulging varicosities are less prominent than before and the distal thigh and pretibial region. Left leg is unremarkable.  Today I ordered a venous duplex exam of the right leg which are reviewed and interpreted. There is no DVT. The total anterior accessory branch of the right GSP has been ablated.     Assessment:     Successful laser ablation right GSV anterior accessory branch with decompression of distal varicosities    Plan:     Patient will return to see [place] on a when necessary basis

## 2012-04-01 NOTE — Procedures (Unsigned)
DUPLEX DEEP VENOUS EXAM - LOWER EXTREMITY  INDICATION:  Right great saphenous venous ablation, varicose veins.  HISTORY:  Edema:  Yes. Trauma/Surgery:  Right endovenous laser ablation right GSV proximal thigh segment on 03/16/2012. Pain:  No. PE:  No. Previous DVT:  No. Anticoagulants:  No. Other:  DUPLEX EXAM:               CFV   SFV   PopV  PTV    GSV               R  L  R  L  R  L  R   L  R  L Thrombosis    0  0  0     0     0      + Spontaneous   +  +  +     +     +      0 Phasic        +  +  +     +     +      0 Augmentation  +  +  +     +     +      0 Compressible  +  +  +     +     +      0 Competent     +  +  +     0     +  Legend:  + - yes  o - no  p - partial  D - decreased  IMPRESSION: 1. No evidence of deep vein thrombosis identified involving the right     lower extremity. 2. Good post-ablation results the length of the right great saphenous     vein ablated. 3. Patent and compressible contralateral common femoral vein present.   _____________________________ Larina Earthly, M.D.  SH/MEDQ  D:  03/25/2012  T:  03/25/2012  Job:  [id number]

## 2012-06-29 ENCOUNTER — Encounter: Payer: Self-pay | Admitting: Vascular Surgery

## 2012-06-30 ENCOUNTER — Encounter: Payer: Self-pay | Admitting: Vascular Surgery

## 2012-06-30 ENCOUNTER — Ambulatory Visit (INDEPENDENT_AMBULATORY_CARE_PROVIDER_SITE_OTHER): Payer: Medicare Other | Admitting: Vascular Surgery

## 2012-06-30 VITALS — BP 127/71 | HR 80 | Resp 16 | Ht 67.0 in | Wt 155.0 lb

## 2012-06-30 DIAGNOSIS — I83893 Varicose veins of bilateral lower extremities with other complications: Secondary | ICD-10-CM

## 2012-06-30 NOTE — Progress Notes (Signed)
Subjective:     Patient ID: Hailey Graham, female   DOB: 1928/08/15, 79 y.o.   MRN: [id number]  HPI this 78 year old female returns 3 months post laser ablation of the anterior accessory branch of the right great saphenous vein for painful varicosities in the right lower extremity. She has worn elastic compression stockings-20-30 mm gradient-long leg as well as trying elevation and ibuprofen. She is continuing to have discomfort and tenderness in the bulging varicosities which are in the distal thigh and proximal calf anteriorly and posteriorly. She also has some mild discomfort in the left leg with bulging varicosities. This has not been previously evaluated. She has had no bleeding or stasis ulcers in either lower extremity.  Past Medical History  Diagnosis Date  . Atrial fibrillation   . GERD (gastroesophageal reflux disease)   . Hyperlipidemia   . Glaucoma     History  Substance Use Topics  . Smoking status: Never Smoker   . Smokeless tobacco: Never Used  . Alcohol Use: No    No family history on file.  Allergies  Allergen Reactions  . Codeine   . Morphine   . Sulfa Drugs Cross Reactors     Current outpatient prescriptions:acetic acid-hydrocortisone (VOSOL-HC) otic solution, as directed. , Disp: , Rfl: ;  ALPRAZolam (XANAX) 0.5 MG tablet, Take 0.5 mg by mouth as needed. Uses 1-2 per week sometimes every 2-3 weeks , Disp: , Rfl: ;  ezetimibe-simvastatin (VYTORIN) 10-20 MG per tablet, Take 1 tablet by mouth at bedtime., Disp: 30 tablet, Rfl: 6 latanoprost (XALATAN) 0.005 % ophthalmic solution, Place 1 drop into both eyes at bedtime.  , Disp: , Rfl: ;  pantoprazole (PROTONIX) 40 MG tablet, Take 40 mg by mouth daily.  , Disp: , Rfl:   BP 127/71  Pulse 80  Resp 16  Ht 5\' 7"  (1.702 m)  Wt 155 lb (70.308 kg)  BMI 24.28 kg/m2  Body mass index is 24.28 kg/(m^2).           Review of Systems denies chest pain, dyspnea on exertion, PND, orthopnea, hemoptysis,  claudication.     Objective:   Physical Exam blood pressure 127/71 heart rate 80 respirations 16 General well-developed well-nourished female in no apparent stress alert and oriented x3 Lungs no rhonchi or wheezing Cardiovascular regular and no murmurs carotid pulses 3+ no bruits Lower extremities right leg with 3+ femoral and dorsalis pedis pulse palpable. There bulging varicosities in the distal thigh and proximal calf over the great saphenous system. There also some diffuse reticular and spider veins in the pretibial region on the right. Left leg has 3+ dorsalis pedis pulse with bulging varicosities 11 knee of the great saphenous system.      Assessment:     Residual bulging varicosities post laser ablation right anterior accessory branch great saphenous system    Plan:     Patient needs #1 stab phlebectomy of residual secondary varicosities following laser ablation anterior chest rib branch right great saphenous vein #2  1  course of sclerotherapy for prominent painful reticular and spider veins in the right pretibial region Will proceed with precertification to perform this in near future

## 2012-07-15 ENCOUNTER — Ambulatory Visit (INDEPENDENT_AMBULATORY_CARE_PROVIDER_SITE_OTHER): Payer: Medicare Other | Admitting: Cardiology

## 2012-07-15 ENCOUNTER — Encounter: Payer: Self-pay | Admitting: Cardiology

## 2012-07-15 VITALS — BP 120/88 | HR 78 | Ht 67.0 in | Wt 158.0 lb

## 2012-07-15 DIAGNOSIS — E785 Hyperlipidemia, unspecified: Secondary | ICD-10-CM

## 2012-07-15 DIAGNOSIS — I10 Essential (primary) hypertension: Secondary | ICD-10-CM

## 2012-07-15 DIAGNOSIS — I4891 Unspecified atrial fibrillation: Secondary | ICD-10-CM

## 2012-07-15 DIAGNOSIS — I480 Paroxysmal atrial fibrillation: Secondary | ICD-10-CM | POA: Insufficient documentation

## 2012-07-15 DIAGNOSIS — K219 Gastro-esophageal reflux disease without esophagitis: Secondary | ICD-10-CM | POA: Insufficient documentation

## 2012-07-15 DIAGNOSIS — E782 Mixed hyperlipidemia: Secondary | ICD-10-CM

## 2012-07-15 NOTE — Patient Instructions (Addendum)
Your physician recommends that you schedule a follow-up appointment in: 1 year  Your physician recommends that you return for lab work in: This week  Stool cards x 3 and return to office asap

## 2012-07-15 NOTE — Assessment & Plan Note (Addendum)
Single isolated episode to date.  No specific therapy is being administered.

## 2012-07-15 NOTE — Progress Notes (Signed)
Patient ID: Hailey Graham, female   DOB: 1928/03/01, 78 y.o.   MRN: [id number]  HPI: Scheduled return visit for this delightful woman who sees me for management of cardiovascular risk factors and for a history of atrial fibrillation.  Since her last visit one year ago, she has done generally well.  She reports a number of symptoms including what she characterizes as dizziness but what sounds like a visual disturbance.  She also experiences pain in the eyes briefly when she deviates them upwards.  An appointment with her ophthalmologist as planned for the near future.  She remains active, providing substantial care to her husband who is also a patient of mine and has significant health issues.  Prior to Admission medications   Medication Sig Start Date End Date Taking? Authorizing Provider  ALPRAZolam Prudy Feeler) 0.5 MG tablet Take 0.5 mg by mouth as needed. Uses 1-2 per week sometimes every 2-3 weeks    Yes Historical Provider, MD  dorzolamide (TRUSOPT) 2 % ophthalmic solution Place 1 drop into both eyes 2 (two) times daily.   Yes Historical Provider, MD  ezetimibe-simvastatin (VYTORIN) 10-20 MG per tablet Take 1 tablet by mouth at bedtime. 08/30/11  Yes Kathlen Brunswick, MD  latanoprost (XALATAN) 0.005 % ophthalmic solution Place 1 drop into both eyes at bedtime.     Yes Historical Provider, MD  pantoprazole (PROTONIX) 40 MG tablet Take 40 mg by mouth as needed.    Yes Historical Provider, MD   Allergies  Allergen Reactions  . Codeine   . Morphine   . Sulfa Drugs Cross Reactors      Past medical history, social history, and family history reviewed and updated.  ROS: Denies chest discomfort, dyspnea, orthopnea, PND, palpitations, lightheadedness or syncope.  All other systems reviewed and are negative.  PHYSICAL EXAM: BP 120/88  Pulse 78  Ht 5\' 7"  (1.702 m)  Wt 71.668 kg (158 lb)  BMI 24.75 kg/m2  SpO2 99%  General-Well developed; no acute distress Body habitus-proportionate weight and  height Neck-No JVD; no carotid bruits Lungs-clear lung fields; resonant to percussion Cardiovascular-normal PMI; normal S1 and S2; minimal systolic ejection murmur Abdomen-normal bowel sounds; soft and non-tender without masses or organomegaly Musculoskeletal-No deformities, no cyanosis or clubbing Neurologic-Normal cranial nerves; symmetric strength and tone Skin-Warm, no significant lesions Extremities-distal pulses intact; excessive soft tissue around the ankles without edema  ASSESSMENT AND PLAN:  [place] Bing, MD 07/15/2012 7:49 PM

## 2012-07-15 NOTE — Assessment & Plan Note (Signed)
Adequately managed with occasional as needed PPI.

## 2012-07-15 NOTE — Progress Notes (Deleted)
Name: Hailey Graham    DOB: [birth year]  Age: 78 y.o.  MR#: [id number]       PCP:  Fredirick Maudlin, MD      Insurance: @PAYORNAME @   CC:    Chief Complaint  Patient presents with  . dyslipidemia    No complaints - Bottles - TC  . paroxysmal fib    VS BP 120/88  Pulse 78  Ht 5\' 7"  (1.702 m)  Wt 158 lb (71.668 kg)  BMI 24.75 kg/m2  SpO2 99%  Weights Current Weight  07/15/12 158 lb (71.668 kg)  06/30/12 155 lb (70.308 kg)  03/24/12 155 lb (70.308 kg)    Blood Pressure  BP Readings from Last 3 Encounters:  07/15/12 120/88  06/30/12 127/71  03/24/12 145/85     Admit date:  (Not on file) Last encounter with RMR:  Visit date not found   Allergy Allergies  Allergen Reactions  . Codeine   . Morphine   . Sulfa Drugs Cross Reactors     Current Outpatient Prescriptions  Medication Sig Dispense Refill  . ALPRAZolam (XANAX) 0.5 MG tablet Take 0.5 mg by mouth as needed. Uses 1-2 per week sometimes every 2-3 weeks       . dorzolamide (TRUSOPT) 2 % ophthalmic solution Place 1 drop into both eyes 2 (two) times daily.      Marland Kitchen ezetimibe-simvastatin (VYTORIN) 10-20 MG per tablet Take 1 tablet by mouth at bedtime.  30 tablet  6  . latanoprost (XALATAN) 0.005 % ophthalmic solution Place 1 drop into both eyes at bedtime.        . pantoprazole (PROTONIX) 40 MG tablet Take 40 mg by mouth daily.          Discontinued Meds:    Medications Discontinued During This Encounter  Medication Reason  . acetic acid-hydrocortisone (VOSOL-HC) otic solution Discontinued by provider    Patient Active Problem List  Diagnosis  . Varicose veins of lower extremities with other complications  . Paroxysmal atrial fibrillation  . Gastroesophageal reflux disease  . Hyperlipidemia    LABS No visits with results within 3 Month(s) from this visit. Latest known visit with results is:  [facility] Conversion Encounter on 05/26/2010  Component Date Value  . Cholesterol 05/26/2010 149   . Triglycerides  05/26/2010 86   . HDL 05/26/2010 48   . LDL Cholesterol 05/26/2010 17   . Sodium 05/26/2010 139   . Potassium 05/26/2010 4.0   . Chloride 05/26/2010 105   . CO2 05/26/2010 26   . Glucose, Bld 05/26/2010 98   . BUN 05/26/2010 22   . Creatinine, Ser 05/26/2010 0.78   . Alkaline Phosphatase 05/26/2010 71   . AST 05/26/2010 17   . ALT 05/26/2010 18   . Total Protein 05/26/2010 6.9   . Albumin 05/26/2010 4.3   . Calcium 05/26/2010 10.1      Results for this Opt Visit:     Results for orders placed in visit on 05/26/10  CONVERTED [facility] LAB      Component Value Range   Cholesterol 149     Triglycerides 86     HDL 48     LDL Cholesterol 17     Sodium 139     Potassium 4.0     Chloride 105     CO2 26     Glucose, Bld 98     BUN 22     Creatinine, Ser 0.78     Alkaline Phosphatase 71  AST 17     ALT 18     Total Protein 6.9     Albumin 4.3     Calcium 10.1      EKG Orders placed in visit on 07/17/11  . EKG 12-LEAD     Prior Assessment and Plan Problem List as of 07/15/2012            Cardiology Problems   Varicose veins of lower extremities with other complications   Paroxysmal atrial fibrillation   Hyperlipidemia     Other   Gastroesophageal reflux disease       Imaging: No results found.   FRS Calculation: Score not calculated

## 2012-07-15 NOTE — Assessment & Plan Note (Signed)
Lipid values 2 years ago were excellent, but no more recent measurements are available.  A repeat lipid profile will be obtained.

## 2012-07-17 ENCOUNTER — Encounter: Payer: Self-pay | Admitting: Vascular Surgery

## 2012-07-17 LAB — LIPID PANEL
Cholesterol: 145 mg/dL (ref 0–200)
LDL Cholesterol: 90 mg/dL (ref 0–99)
VLDL: 12 mg/dL (ref 0–40)

## 2012-07-17 LAB — COMPREHENSIVE METABOLIC PANEL
ALT: 16 U/L (ref 0–35)
AST: 17 U/L (ref 0–37)
Albumin: 4 g/dL (ref 3.5–5.2)
Alkaline Phosphatase: 68 U/L (ref 39–117)
BUN: 23 mg/dL (ref 6–23)
Potassium: 3.9 mEq/L (ref 3.5–5.3)
Sodium: 142 mEq/L (ref 135–145)

## 2012-07-17 LAB — CBC
Hemoglobin: 13.5 g/dL (ref 12.0–15.0)
MCH: 28.9 pg (ref 26.0–34.0)
MCHC: 33.5 g/dL (ref 30.0–36.0)
RDW: 14.9 % (ref 11.5–15.5)

## 2012-07-20 ENCOUNTER — Encounter: Payer: Self-pay | Admitting: Vascular Surgery

## 2012-07-20 ENCOUNTER — Ambulatory Visit (INDEPENDENT_AMBULATORY_CARE_PROVIDER_SITE_OTHER): Payer: Medicare Other | Admitting: Vascular Surgery

## 2012-07-20 VITALS — BP 130/82 | HR 71 | Resp 16 | Ht 67.0 in | Wt 155.0 lb

## 2012-07-20 DIAGNOSIS — I83893 Varicose veins of bilateral lower extremities with other complications: Secondary | ICD-10-CM

## 2012-07-20 NOTE — Progress Notes (Signed)
Subjective:     Patient ID: Hailey Graham, female   DOB: 04-Mar-1928, 78 y.o.   MRN: [id number]  HPI this 78 year old female had greater than 20 stab phlebectomy is performed in the thigh calf and ankle area for secondary painful varicosities. This was performed under local tumescent anesthesia. She tolerated the procedure well the  Review of Systems     Objective:   Physical ExamBP 130/82  Pulse 71  Resp 16  Ht 5\' 7"  (1.702 m)  Wt 155 lb (70.308 kg)  BMI 24.28 kg/m2      Assessment:     Well-tolerated stab phlebectomy-greater than 20 right leg performed under local tumescent anesthesia     Plan:     Return in 6 weeks for followup and we'll then schedule one core sclerotherapy to complete her treatment

## 2012-07-20 NOTE — Progress Notes (Signed)
Laser Ablation Procedure      Date: 07/20/2012    Hailey Graham DOB:Jan 15, 1928  Consent signed: Yes  Surgeon:J.D. Hart Rochester  Procedure: Stab Phlebectomies: right  Leg  BP 130/82  Pulse 71  Resp 16  Ht 5\' 7"  (1.702 m)  Wt 155 lb (70.308 kg)  BMI 24.28 kg/m2  Start time: 11:15   End time: 12:00  Tumescent Anesthesia: 300 cc 0.9% NaCl with 50 cc Lidocaine HCL with 1% Epi and 15 cc 8.4% NaHCO3  Local Anesthesia: 9 cc Lidocaine HCL and NaHCO3 (ratio 2:1)  Stab Phlebectomy: >20 Sites: Thigh and Calf  Patient tolerated procedure well: Yes  Notes:   Description of Procedure:  .  The patient was then put into Trendelenburg position.  Local anesthetic was utilized overlying the marked varicosities.  Greater than 20 stab wounds were made using the tip of an 11 blade; and using the vein hook,  The phlebectomies were performed using a hemostat to avulse these varicosities.  Adequate hemostasis was achieved, and steri strips were applied to the stab wound.      ABD pads and thigh high compression stockings were applied.  Ace wrap bandages were applied over the phlebectomy sites. Blood loss was less than 15 cc.  The patient ambulated out of the operating room having tolerated the procedure well.

## 2012-07-21 ENCOUNTER — Encounter: Payer: Self-pay | Admitting: Vascular Surgery

## 2012-07-29 ENCOUNTER — Telehealth: Payer: Self-pay | Admitting: *Deleted

## 2012-07-29 NOTE — Telephone Encounter (Signed)
Mrs. Waltrip states she is post stab phlebectomy (right leg) 07-20-2012 by Arnoldo Lenis.  She states she has a "small knot 1 inch oval shaped"  lateral to an incision about 6 inches below her right knee.  She states she has no pain ,redness, or discharge from this site.  Encouraged her to wear her compression stockings and elevate her right leg when sitting, and use ice compress as needed.  Encouraged her to call [facility] if symptoms worsen or if she experiences increased pain or swelling.  Dorthy Hustead, Neena Rhymes

## 2012-08-03 ENCOUNTER — Encounter: Payer: Medicare Other | Admitting: *Deleted

## 2012-08-03 ENCOUNTER — Other Ambulatory Visit: Payer: Self-pay

## 2012-08-03 DIAGNOSIS — I4891 Unspecified atrial fibrillation: Secondary | ICD-10-CM

## 2012-08-11 ENCOUNTER — Encounter: Payer: Self-pay | Admitting: *Deleted

## 2012-08-12 ENCOUNTER — Ambulatory Visit (INDEPENDENT_AMBULATORY_CARE_PROVIDER_SITE_OTHER): Payer: Medicare Other | Admitting: *Deleted

## 2012-08-12 ENCOUNTER — Encounter: Payer: Self-pay | Admitting: *Deleted

## 2012-08-12 DIAGNOSIS — I83893 Varicose veins of bilateral lower extremities with other complications: Secondary | ICD-10-CM

## 2012-08-12 DIAGNOSIS — I781 Nevus, non-neoplastic: Secondary | ICD-10-CM

## 2012-08-12 NOTE — Progress Notes (Signed)
X=.3% Sotradecol administered with a 27g butterfly.  Patient received a total of 11cc.  Treated all areas of concern that looked like they could hemorrhage. Patient tolerated well except for itching. Will follow prn.  Photos: yes  Compression stockings applied: yes  Also assessed her stab phlebectomy sites. They are resolving as expected. Some hard tender areas. Bruising diminished. Suggested heat for the harder areas to help them soften up.

## 2012-08-17 ENCOUNTER — Encounter: Payer: Self-pay | Admitting: Vascular Surgery

## 2012-08-18 ENCOUNTER — Ambulatory Visit (INDEPENDENT_AMBULATORY_CARE_PROVIDER_SITE_OTHER): Payer: Medicare Other | Admitting: Urology

## 2012-08-18 DIAGNOSIS — R82998 Other abnormal findings in urine: Secondary | ICD-10-CM

## 2012-08-18 DIAGNOSIS — N200 Calculus of kidney: Secondary | ICD-10-CM

## 2012-09-01 ENCOUNTER — Ambulatory Visit: Payer: Medicare Other | Admitting: Vascular Surgery

## 2012-11-21 ENCOUNTER — Other Ambulatory Visit (INDEPENDENT_AMBULATORY_CARE_PROVIDER_SITE_OTHER): Payer: Self-pay | Admitting: Internal Medicine

## 2012-11-21 DIAGNOSIS — K219 Gastro-esophageal reflux disease without esophagitis: Secondary | ICD-10-CM

## 2012-12-23 HISTORY — PX: CATARACT EXTRACTION W/ INTRAOCULAR LENS IMPLANT: SHX1309

## 2012-12-23 HISTORY — PX: GLAUCOMA SURGERY: SHX656

## 2013-01-11 ENCOUNTER — Telehealth: Payer: Self-pay

## 2013-01-11 NOTE — Telephone Encounter (Signed)
Patient walked in office 01/11/13.Stated she sent Dr.Rothbart a letter and a copy of a recent EKG that revealed atrial fib.Stated she wanted to know what Dr.Rothbart thought.Patient was told Dr.Rothbart and his nurse are out of the office today,will send message to her.Patient wants to be called back [phone].

## 2013-01-12 NOTE — Telephone Encounter (Signed)
Patient states that she has had no symptoms.  This was an incidental finding while having eye surgery.  Dr Dietrich Pates has EKG in his possession for review.  Appointment made for 1/27 with him for assessment of this issue.

## 2013-01-15 ENCOUNTER — Encounter: Payer: Self-pay | Admitting: Cardiology

## 2013-01-18 ENCOUNTER — Ambulatory Visit (INDEPENDENT_AMBULATORY_CARE_PROVIDER_SITE_OTHER): Payer: Medicare Other | Admitting: Cardiology

## 2013-01-18 ENCOUNTER — Encounter: Payer: Self-pay | Admitting: Cardiology

## 2013-01-18 VITALS — BP 118/80 | HR 78 | Ht 67.0 in | Wt 163.1 lb

## 2013-01-18 DIAGNOSIS — I4891 Unspecified atrial fibrillation: Secondary | ICD-10-CM

## 2013-01-18 DIAGNOSIS — I480 Paroxysmal atrial fibrillation: Secondary | ICD-10-CM

## 2013-01-18 NOTE — Patient Instructions (Addendum)
Your physician recommends that you schedule a follow-up appointment in: 1 month  Your physician has recommended that you wear an event monitor. Event monitors are medical devices that record the heart's electrical activity. Doctors most often [place] these monitors to diagnose arrhythmias. Arrhythmias are problems with the speed or rhythm of the heartbeat. The monitor is a small, portable device. You can wear one while you do your normal daily activities. This is usually used to diagnose what is causing palpitations/syncope (passing out).  Your physician recommends that you return for lab work in: Within the week (thyroid)

## 2013-01-18 NOTE — Progress Notes (Deleted)
Name: Hailey Graham    DOB: [birth year]  Age: 79 y.o.  MR#: [id number]       PCP:  Fredirick Maudlin, MD      Insurance: @PAYORNAME @   CC:    Chief Complaint  Patient presents with  . Follow-up    Atrial Fib.   STOOLS NEGATIVE 08/03/12  MEDICATION LIST A FIB ON OPERATIVE EKG  VS BP 118/80  Pulse 78  Ht 5\' 7"  (1.702 m)  Wt 163 lb 1.3 oz (73.973 kg)  BMI 25.54 kg/m2  SpO2 96%  Weights Current Weight  01/18/13 163 lb 1.3 oz (73.973 kg)  07/20/12 155 lb (70.308 kg)  07/15/12 158 lb (71.668 kg)    Blood Pressure  BP Readings from Last 3 Encounters:  01/18/13 118/80  07/20/12 130/82  07/15/12 120/88     Admit date:  (Not on file) Last encounter with RMR:  01/15/2013   Allergy Allergies  Allergen Reactions  . Codeine   . Morphine   . Sulfa Drugs Cross Reactors     Current Outpatient Prescriptions  Medication Sig Dispense Refill  . ALPRAZolam (XANAX) 0.5 MG tablet Take 0.5 mg by mouth as needed. Uses 1-2 per week sometimes every 2-3 weeks       . bimatoprost (LUMIGAN) 0.01 % SOLN Place 1 drop into both eyes at bedtime.      . dorzolamide (TRUSOPT) 2 % ophthalmic solution Place 1 drop into both eyes 2 (two) times daily.      Marland Kitchen ezetimibe-simvastatin (VYTORIN) 10-20 MG per tablet Take 1 tablet by mouth at bedtime.  30 tablet  6  . pantoprazole (PROTONIX) 40 MG tablet       . prednisoLONE acetate (PRED FORTE) 1 % ophthalmic suspension Place 1 drop into the left eye 4 (four) times daily.        Discontinued Meds:    Medications Discontinued During This Encounter  Medication Reason  . latanoprost (XALATAN) 0.005 % ophthalmic solution Error  . PROTONIX 40 MG tablet     Patient Active Problem List  Diagnosis  . Varicose veins of lower extremities with other complications  . Paroxysmal atrial fibrillation  . Gastroesophageal reflux disease  . Hyperlipidemia    LABS No visits with results within 3 Month(s) from this visit. Latest known visit with results  is:  Office Visit on 07/15/2012  Component Date Value  . Sodium 07/15/2012 142   . Potassium 07/15/2012 3.9   . Chloride 07/15/2012 109   . CO2 07/15/2012 27   . Glucose, Bld 07/15/2012 80   . BUN 07/15/2012 23   . Creat 07/15/2012 0.72   . Total Bilirubin 07/15/2012 0.5   . Alkaline Phosphatase 07/15/2012 68   . AST 07/15/2012 17   . ALT 07/15/2012 16   . Total Protein 07/15/2012 6.3   . Albumin 07/15/2012 4.0   . Calcium 07/15/2012 9.6   . WBC 07/15/2012 4.7   . RBC 07/15/2012 4.67   . Hemoglobin 07/15/2012 13.5   . HCT 07/15/2012 40.3   . MCV 07/15/2012 86.3   . [facility] 07/15/2012 28.9   . MCHC 07/15/2012 33.5   . RDW 07/15/2012 14.9   . Platelets 07/15/2012 231   . Cholesterol 07/15/2012 145   . Triglycerides 07/15/2012 61   . HDL 07/15/2012 43   . Total CHOL/HDL Ratio 07/15/2012 3.4   . VLDL 07/15/2012 12   . LDL Cholesterol 07/15/2012 90      Results for this Opt Visit:  Results for orders placed in visit on 07/15/12  COMPREHENSIVE METABOLIC PANEL      Component Value Range   Sodium 142  135 - 145 mEq/L   Potassium 3.9  3.5 - 5.3 mEq/L   Chloride 109  96 - 112 mEq/L   CO2 27  19 - 32 mEq/L   Glucose, Bld 80  70 - 99 mg/dL   BUN 23  6 - 23 mg/dL   Creat 9.62  9.52 - 8.41 mg/dL   Total Bilirubin 0.5  0.3 - 1.2 mg/dL   Alkaline Phosphatase 68  39 - 117 U/L   AST 17  0 - 37 U/L   ALT 16  0 - 35 U/L   Total Protein 6.3  6.0 - 8.3 g/dL   Albumin 4.0  3.5 - 5.2 g/dL   Calcium 9.6  8.4 - 32.4 mg/dL  CBC      Component Value Range   WBC 4.7  4.0 - 10.5 K/uL   RBC 4.67  3.87 - 5.11 MIL/uL   Hemoglobin 13.5  12.0 - 15.0 g/dL   HCT 40.1  02.7 - 25.3 %   MCV 86.3  78.0 - 100.0 fL   MCH 28.9  26.0 - 34.0 pg   MCHC 33.5  30.0 - 36.0 g/dL   RDW 66.4  40.3 - 47.4 %   Platelets 231  150 - 400 K/uL  LIPID PANEL      Component Value Range   Cholesterol 145  0 - 200 mg/dL   Triglycerides 61  <259 mg/dL   HDL 43  >56 mg/dL   Total CHOL/HDL Ratio 3.4     VLDL 12   0 - 40 mg/dL   LDL Cholesterol 90  0 - 99 mg/dL    EKG Orders placed in visit on 01/18/13  . EKG 12-LEAD     Prior Assessment and Plan Problem List as of 01/18/2013            Cardiology Problems   Varicose veins of lower extremities with other complications   Paroxysmal atrial fibrillation   Last Assessment & Plan Note   07/15/2012 Office Visit Addendum 07/19/2012  4:58 PM by Kathlen Brunswick, MD    Single isolated episode to date.  No specific therapy is being administered.    Hyperlipidemia   Last Assessment & Plan Note   07/15/2012 Office Visit Signed 07/15/2012  7:52 PM by Kathlen Brunswick, MD    Lipid values 2 years ago were excellent, but no more recent measurements are available.  A repeat lipid profile will be obtained.      Other   Gastroesophageal reflux disease   Last Assessment & Plan Note   07/15/2012 Office Visit Signed 07/15/2012  7:52 PM by Kathlen Brunswick, MD    Adequately managed with occasional as needed PPI.        Imaging: No results found.   FRS Calculation: Score not calculated

## 2013-01-19 ENCOUNTER — Encounter: Payer: Self-pay | Admitting: Cardiology

## 2013-01-19 NOTE — Progress Notes (Signed)
Patient ID: Hailey Graham, female   DOB: 12-11-1928, 79 y.o.   MRN: [id number]  HPI: Hailey Graham is seen at the request of her ophthalmologist for evaluation of recurrent atrial arrhythmias.  During administration of a retrobulbar block prior to cataract extraction and corrective surgery for glaucoma, onset of a supraventricular tachycardia noted.  Surgery was completed without incident, and atrial arrhythmia apparently persisted at the time of discharge.  She was asymptomatic perioperatively and has noted no cardiopulmonary symptoms since her surgery.  She did develop vague neurologic symptoms including paresthesias and numbness in the extremities that were attributed to a topical preparation she was advised to use intraocularly following surgery.  With discontinuation of that medication, symptoms resolved.  Atrial fibrillation was last documented nearly 7 years ago.  Prior to Admission medications   Medication Sig Start Date End Date Taking? Authorizing Provider  ALPRAZolam Prudy Feeler) 0.5 MG tablet Take 0.5 mg by mouth as needed. Uses 1-2 per week sometimes every 2-3 weeks    Yes Historical Provider, MD  bimatoprost (LUMIGAN) 0.01 % SOLN Place 1 drop into both eyes at bedtime.   Yes Historical Provider, MD  dorzolamide (TRUSOPT) 2 % ophthalmic solution Place 1 drop into both eyes 2 (two) times daily.   Yes Historical Provider, MD  ezetimibe-simvastatin (VYTORIN) 10-20 MG per tablet Take 1 tablet by mouth at bedtime. 08/30/11  Yes Kathlen Brunswick, MD  pantoprazole (PROTONIX) 40 MG tablet  11/21/12  Yes Len Blalock, NP  prednisoLONE acetate (PRED FORTE) 1 % ophthalmic suspension Place 1 drop into the left eye 4 (four) times daily.   Yes Historical Provider, MD   Allergies  Allergen Reactions  . Codeine   . Morphine   . Sulfa Drugs Cross Reactors   Past medical history, social history, and family history reviewed and updated.  ROS: Denies chest pain, dyspnea, exercise intolerance, orthopnea, PND,  palpitations, lightheadedness or syncope.  All other systems reviewed and are negative.  PHYSICAL EXAM: BP 118/80  Pulse 78  Ht 5\' 7"  (1.702 m)  Wt 73.973 kg (163 lb 1.3 oz)  BMI 25.54 kg/m2  SpO2 96%   General-Well developed; no acute distress Body habitus-proportionate weight and height Neck-No JVD; no carotid bruits Lungs-clear lung fields; resonant to percussion Cardiovascular-normal PMI; normal S1 and S2; regular rhythm; minimal systolic ejection murmur Abdomen-normal bowel sounds; soft and non-tender without masses or organomegaly Musculoskeletal-No deformities, no cyanosis or clubbing Neurologic-Normal cranial nerves; symmetric strength and tone Skin-Warm, no significant lesions Extremities-distal pulses intact; no edema  EKG: Tracing performed at [facility] 01/05/2013 obtained and reviewed.  Interpretation was atrial fibrillation, but rhythm actually appears to be sinus with frequent supraventricular ectopics.  Tracing today shows sinus rhythm with sinus arrhythmia, a single PAC, and is otherwise unremarkable.  ASSESSMENT AND PLAN:  [place] Bing, MD 01/19/2013 8:33 AM

## 2013-01-19 NOTE — Assessment & Plan Note (Addendum)
Atrial arrhythmias present in the setting of perioperative stress.  Patient noted no pain during retrobulbar block, but sympathetic and parasympathetic stimulation may have been present for and contributed to arrhythmia..  For now, medication regime will not be modified.  We will proceed with event recording for 3 weeks.  If no additional arrhythmias are identified, I am not inclined to recommend long-term anticoagulation.

## 2013-01-29 ENCOUNTER — Other Ambulatory Visit: Payer: Self-pay | Admitting: Cardiology

## 2013-02-17 ENCOUNTER — Other Ambulatory Visit: Payer: Self-pay | Admitting: Cardiology

## 2013-02-17 DIAGNOSIS — I4891 Unspecified atrial fibrillation: Secondary | ICD-10-CM

## 2013-02-19 ENCOUNTER — Ambulatory Visit (INDEPENDENT_AMBULATORY_CARE_PROVIDER_SITE_OTHER): Payer: Medicare Other | Admitting: Cardiology

## 2013-02-19 ENCOUNTER — Telehealth: Payer: Self-pay | Admitting: Cardiology

## 2013-02-19 ENCOUNTER — Encounter: Payer: Self-pay | Admitting: *Deleted

## 2013-02-19 ENCOUNTER — Encounter: Payer: Self-pay | Admitting: Cardiology

## 2013-02-19 VITALS — BP 126/78 | HR 74 | Ht 67.0 in | Wt 167.0 lb

## 2013-02-19 DIAGNOSIS — I4891 Unspecified atrial fibrillation: Secondary | ICD-10-CM

## 2013-02-19 DIAGNOSIS — E785 Hyperlipidemia, unspecified: Secondary | ICD-10-CM

## 2013-02-19 DIAGNOSIS — Z7901 Long term (current) use of anticoagulants: Secondary | ICD-10-CM | POA: Insufficient documentation

## 2013-02-19 MED ORDER — METOPROLOL SUCCINATE ER 50 MG PO TB24: 50.0000 mg | ORAL_TABLET | Freq: Every day | ORAL | Status: DC

## 2013-02-19 NOTE — Progress Notes (Deleted)
Name: Hailey Graham    DOB: [birth year]  Age: 79 y.o.  MR#: [id number]       PCP:  Fredirick Maudlin, MD      Insurance: Payor: [employer] [place] [employer]  Plan: BLUE MEDICARE  Product Type: *No Product type*    CC:   No chief complaint on file.  MEDICATION BOTTLES   VS Filed Vitals:   02/19/13 1130  BP: 126/78  Pulse: 74  Height: 5\' 7"  (1.702 m)  Weight: 167 lb (75.751 kg)    Weights Current Weight  02/19/13 167 lb (75.751 kg)  01/18/13 163 lb 1.3 oz (73.973 kg)  07/20/12 155 lb (70.308 kg)    Blood Pressure  BP Readings from Last 3 Encounters:  02/19/13 126/78  01/18/13 118/80  07/20/12 130/82     Admit date:  (Not on file) Last encounter with RMR:  01/18/2013   Allergy Codeine; Morphine; and Sulfa drugs cross reactors  Current Outpatient Prescriptions  Medication Sig Dispense Refill  . ALPRAZolam (XANAX) 0.5 MG tablet Take 0.5 mg by mouth as needed. Uses 1-2 per week sometimes every 2-3 weeks       . bimatoprost (LUMIGAN) 0.01 % SOLN Place 1 drop into both eyes at bedtime.      . brimonidine (ALPHAGAN) 0.2 % ophthalmic solution       . dorzolamide (TRUSOPT) 2 % ophthalmic solution Place 1 drop into both eyes 2 (two) times daily.      Marland Kitchen latanoprost (XALATAN) 0.005 % ophthalmic solution       . pantoprazole (PROTONIX) 40 MG tablet Take 40 mg by mouth daily.       . prednisoLONE acetate (PRED FORTE) 1 % ophthalmic suspension Place 1 drop into the left eye 4 (four) times daily.      Marland Kitchen ezetimibe-simvastatin (VYTORIN) 10-20 MG per tablet Take 1 tablet by mouth at bedtime.  30 tablet  6   No current facility-administered medications for this visit.    Discontinued Meds:   There are no discontinued medications.  Patient Active Problem List  Diagnosis  . Varicose veins of lower extremities with other complications  . Paroxysmal atrial fibrillation  . Gastroesophageal reflux disease  . Hyperlipidemia    LABS    Component Value Date/Time   NA 142  07/15/2012 1405   NA 139 05/26/2010 1822   NA 139 05/26/2010   K 3.9 07/15/2012 1405   K 4.0 05/26/2010 1822   K 4.0 05/26/2010   CL 109 07/15/2012 1405   CL 105 05/26/2010 1822   CL 105 05/26/2010   CO2 27 07/15/2012 1405   CO2 26 05/26/2010 1822   CO2 26 05/26/2010   GLUCOSE 80 07/15/2012 1405   GLUCOSE 98 05/26/2010 1822   GLUCOSE 98 05/26/2010   BUN 23 07/15/2012 1405   BUN 22 05/26/2010 1822   BUN 22 05/26/2010   CREATININE 0.72 07/15/2012 1405   CREATININE 0.78 05/26/2010 1822   CREATININE 0.78 05/26/2010   CALCIUM 9.6 07/15/2012 1405   CALCIUM 10.1 05/26/2010 1822   CALCIUM 10.1 05/26/2010   CMP     Component Value Date/Time   NA 142 07/15/2012 1405   K 3.9 07/15/2012 1405   CL 109 07/15/2012 1405   CO2 27 07/15/2012 1405   GLUCOSE 80 07/15/2012 1405   BUN 23 07/15/2012 1405   CREATININE 0.72 07/15/2012 1405   CREATININE 0.78 05/26/2010 1822   CALCIUM 9.6 07/15/2012 1405   PROT 6.3 07/15/2012 1405  ALBUMIN 4.0 07/15/2012 1405   AST 17 07/15/2012 1405   ALT 16 07/15/2012 1405   ALKPHOS 68 07/15/2012 1405   BILITOT 0.5 07/15/2012 1405       Component Value Date/Time   WBC 4.7 07/15/2012 1405   HGB 13.5 07/15/2012 1405   HCT 40.3 07/15/2012 1405   MCV 86.3 07/15/2012 1405    Lipid Panel     Component Value Date/Time   CHOL 145 07/15/2012 1405   TRIG 61 07/15/2012 1405   HDL 43 07/15/2012 1405   CHOLHDL 3.4 07/15/2012 1405   VLDL 12 07/15/2012 1405   LDLCALC 90 07/15/2012 1405    ABG No results found for this basename: phart, pco2, pco2art, po2, po2art, hco3, tco2, acidbasedef, o2sat     Lab Results  Component Value Date   TSH 2.207 01/29/2013   BNP (last 3 results) No results found for this basename: PROBNP,  in the last 8760 hours Cardiac Panel (last 3 results) No results found for this basename: CKTOTAL, CKMB, TROPONINI, RELINDX,  in the last 72 hours  Iron/TIBC/Ferritin No results found for this basename: iron, tibc, ferritin     EKG Orders placed in visit on 02/17/13  . CARDIAC EVENT MONITOR      Prior Assessment and Plan Problem List as of 02/19/2013     ICD-9-CM     Cardiology Problems   Varicose veins of lower extremities with other complications   Paroxysmal atrial fibrillation   Last Assessment & Plan   01/18/2013 Office Visit Edited 01/22/2013 10:26 AM by Kathlen Brunswick, MD     Atrial arrhythmias present in the setting of perioperative stress.  Patient noted no pain during retrobulbar block, but sympathetic and parasympathetic stimulation may have been present for and contributed to arrhythmia..  For now, medication regime will not be modified.  We will proceed with event recording for 3 weeks.  If no additional arrhythmias are identified, I am not inclined to recommend long-term anticoagulation.    Hyperlipidemia   Last Assessment & Plan   07/15/2012 Office Visit Written 07/15/2012  7:52 PM by Kathlen Brunswick, MD     Lipid values 2 years ago were excellent, but no more recent measurements are available.  A repeat lipid profile will be obtained.      Other   Gastroesophageal reflux disease   Last Assessment & Plan   07/15/2012 Office Visit Written 07/15/2012  7:52 PM by Kathlen Brunswick, MD     Adequately managed with occasional as needed PPI.        Imaging: No results found.

## 2013-02-19 NOTE — Progress Notes (Signed)
Patient ID: TANITH DAGOSTINO, female   DOB: December 20, 1928, 79 y.o.   MRN: [id number]  HPI: Schedule return visit for this very nice woman with paroxysmal atrial fibrillation. Since her last visit, she has worn an event recorder for 3 weeks. During this interval, she noted no symptoms; however, 2 episodes of atrial fibrillation occurred lasting a total of 12 hours with heart rates as high as 190 bpm.  Current Outpatient Prescriptions  Medication Sig Dispense Refill  . ALPRAZolam (XANAX) 0.5 MG tablet Take 0.5 mg by mouth as needed. Uses 1-2 per week sometimes every 2-3 weeks       . bimatoprost (LUMIGAN) 0.01 % SOLN Place 1 drop into both eyes at bedtime.      . brimonidine (ALPHAGAN) 0.2 % ophthalmic solution       . dorzolamide (TRUSOPT) 2 % ophthalmic solution Place 1 drop into both eyes 2 (two) times daily.      Marland Kitchen latanoprost (XALATAN) 0.005 % ophthalmic solution       . pantoprazole (PROTONIX) 40 MG tablet Take 40 mg by mouth daily.       . prednisoLONE acetate (PRED FORTE) 1 % ophthalmic suspension Place 1 drop into the left eye 4 (four) times daily.      . Rivaroxaban (XARELTO) 20 MG TABS Take 20 mg by mouth daily.      Marland Kitchen ezetimibe-simvastatin (VYTORIN) 10-20 MG per tablet Take 1 tablet by mouth at bedtime.  30 tablet  6  . metoprolol succinate (TOPROL-XL) 50 MG 24 hr tablet Take 1 tablet (50 mg total) by mouth daily. Take with or immediately following a meal.  90 tablet  3   No current facility-administered medications for this visit.   Allergies  Allergen Reactions  . Codeine   . Morphine   . Sulfa Drugs Cross Reactors      Past medical history, social history, and family history reviewed and updated.  ROS: Denies dyspnea, chest discomfort, palpitations, lightheadedness or syncope. All other systems reviewed and are negative.  PHYSICAL EXAM: BP 126/78  Pulse 74  Ht 5\' 7"  (1.702 m)  Wt 75.751 kg (167 lb)  BMI 26.15 kg/m2;  Body mass index is 26.15 kg/(m^2). General-Well developed;  no acute distress Body habitus-proportionate weight and height Neck-No JVD; no carotid bruits Lungs-clear lung fields; resonant to percussion Cardiovascular-normal PMI; normal S1 and S2; somewhat irregular rhythm without tachycardia; modest decrescendo early diastolic murmur Abdomen-normal bowel sounds; soft and non-tender without masses or organomegaly Musculoskeletal-No deformities, no cyanosis or clubbing Neurologic-Normal cranial nerves; symmetric strength and tone Skin-Warm, no significant lesions Extremities-distal pulses intact; fleshy ankles, but no edema  [place] Bing, MD 02/19/2013  1:10 PM  ASSESSMENT AND PLAN

## 2013-02-19 NOTE — Assessment & Plan Note (Addendum)
Control of hyperlipidemia is superb.  An agent as potent as Vytorin probably is not necessary, and atorvastatin at moderate dose would likely be adequate. Dr. Juanetta Gosling manages hyperlipidemia and will adjust therapy as he deems appropriate.

## 2013-02-19 NOTE — Assessment & Plan Note (Addendum)
Patient has intermittent and asymptomatic atrial fibrillation. Although no episode lasted as long as one day, multiple spells persisting for a number of hours indicate significant risk for thromboembolism. Patient's risk factors for same include age and gender. Anticoagulation is warranted. Options were discussed with the patient and rivaroxaban recommended.  Metoprolol will be started in an attempt to better control the ventricular rate in atrial fibrillation.

## 2013-02-19 NOTE — Patient Instructions (Addendum)
Your physician recommends that you schedule a follow-up appointment in: 4 months  Your physician recommends that you return for lab work in: 1 month  Your physician has recommended you make the following change in your medication:  1 - START Xarelto 20 mg daily 2 - START Toprol 50 mg daily  Stools x 3

## 2013-02-19 NOTE — Telephone Encounter (Signed)
PT WALKED IN ASKING IF SHE NEEDED TO TAKE BABY ASPRIN WITH COUMADIN. PER DR ROTHBART NO SHE DID NOT NEED TO.  DR ROTHBART REQUESTED THIS TO BE PUT IN ON A PHONE NOTE/TMJ

## 2013-02-20 NOTE — Telephone Encounter (Signed)
Patient's medication list does not include warfarin-please clarify and determine why patient inquired as to whether she needs aspirin.

## 2013-02-22 NOTE — Telephone Encounter (Signed)
Per patient, she was advised not to take baby aspirin and is on Xarelto, not coumadin.

## 2013-03-01 NOTE — Telephone Encounter (Signed)
Please advise 

## 2013-03-02 ENCOUNTER — Other Ambulatory Visit: Payer: Self-pay | Admitting: *Deleted

## 2013-03-02 DIAGNOSIS — I83893 Varicose veins of bilateral lower extremities with other complications: Secondary | ICD-10-CM

## 2013-03-02 NOTE — Telephone Encounter (Signed)
She is not use aspirin. She is to continue rivaroxaban.

## 2013-03-03 NOTE — Telephone Encounter (Signed)
Spoke to pt to advise results/instructions. Pt understood. Pt advised someone else had contacted her 2 weeks ago to confirm as well

## 2013-03-12 ENCOUNTER — Telehealth: Payer: Self-pay | Admitting: Cardiology

## 2013-03-12 NOTE — Telephone Encounter (Signed)
FYI

## 2013-03-12 NOTE — Telephone Encounter (Signed)
PT STATES SHE HAD A PRE-SYNCOPE EPISODE YESTERDAY.

## 2013-03-15 ENCOUNTER — Telehealth: Payer: Self-pay | Admitting: *Deleted

## 2013-03-15 ENCOUNTER — Ambulatory Visit (INDEPENDENT_AMBULATORY_CARE_PROVIDER_SITE_OTHER): Payer: Medicare Other | Admitting: *Deleted

## 2013-03-15 VITALS — BP 121/80 | HR 52 | Wt 165.0 lb

## 2013-03-15 DIAGNOSIS — I4891 Unspecified atrial fibrillation: Secondary | ICD-10-CM

## 2013-03-15 NOTE — Telephone Encounter (Signed)
Patient requested a call back when you can.  She did not give detail, but said she just left.

## 2013-03-15 NOTE — Progress Notes (Signed)
Presents for blood pressure check and ekg, following a pre-syncopal episode last week.  Was started on Toprol 50 mg daily at last OV.  Baseline HR is high 70's to low 80's.  Today, per EKG is 59 (EPIC).  Sitting and standing BP revealed 121/80 HR 52 to 109/77 HR 73.  States that this was an isolated incident, however she is just feeling week.  Advised her to cut her Toprol in half until further notice and to let us know if she has any further episodes. Verbalized understanding.

## 2013-03-15 NOTE — Telephone Encounter (Signed)
Patient has nurse visit today.

## 2013-03-16 ENCOUNTER — Other Ambulatory Visit: Payer: Self-pay | Admitting: *Deleted

## 2013-03-16 DIAGNOSIS — I4891 Unspecified atrial fibrillation: Secondary | ICD-10-CM

## 2013-03-16 DIAGNOSIS — Z7901 Long term (current) use of anticoagulants: Secondary | ICD-10-CM

## 2013-03-19 LAB — CBC
MCH: 28.4 pg (ref 26.0–34.0)
MCHC: 32.7 g/dL (ref 30.0–36.0)
Platelets: 222 10*3/uL (ref 150–400)

## 2013-03-20 LAB — APTT: aPTT: 30 seconds (ref 24–37)

## 2013-03-20 LAB — COMPREHENSIVE METABOLIC PANEL
AST: 17 U/L (ref 0–37)
Albumin: 3.9 g/dL (ref 3.5–5.2)
Alkaline Phosphatase: 70 U/L (ref 39–117)
Glucose, Bld: 82 mg/dL (ref 70–99)
Potassium: 4 mEq/L (ref 3.5–5.3)
Sodium: 140 mEq/L (ref 135–145)
Total Protein: 6.6 g/dL (ref 6.0–8.3)

## 2013-03-20 LAB — PROTIME-INR: Prothrombin Time: 13.6 seconds (ref 11.6–15.2)

## 2013-03-22 ENCOUNTER — Encounter: Payer: Self-pay | Admitting: Cardiology

## 2013-03-22 DIAGNOSIS — Z9289 Personal history of other medical treatment: Secondary | ICD-10-CM | POA: Insufficient documentation

## 2013-03-23 ENCOUNTER — Ambulatory Visit (INDEPENDENT_AMBULATORY_CARE_PROVIDER_SITE_OTHER): Payer: Medicare Other | Admitting: *Deleted

## 2013-03-23 DIAGNOSIS — Z7901 Long term (current) use of anticoagulants: Secondary | ICD-10-CM

## 2013-03-23 DIAGNOSIS — Z1211 Encounter for screening for malignant neoplasm of colon: Secondary | ICD-10-CM

## 2013-03-23 LAB — POC HEMOCCULT BLD/STL (HOME/3-CARD/SCREEN)
Card #3 Fecal Occult Blood, POC: NEGATIVE
Fecal Occult Blood, POC: NEGATIVE

## 2013-03-29 ENCOUNTER — Other Ambulatory Visit: Payer: Self-pay | Admitting: *Deleted

## 2013-03-29 MED ORDER — RIVAROXABAN 20 MG PO TABS: 20.0000 mg | ORAL_TABLET | Freq: Every day | ORAL | Status: DC

## 2013-03-30 NOTE — Patient Instructions (Signed)
Will repeat Friday April 11 th, as patient states she just doesn't feel right since starting toprol.

## 2013-03-30 NOTE — Progress Notes (Signed)
Patient ID: Hailey Graham, female   DOB: Jul 16, 1928, 79 y.o.   MRN: [id number]  Continue Toprol 25 mg per day. Blood pressure check in 2 weeks.

## 2013-04-02 ENCOUNTER — Ambulatory Visit (INDEPENDENT_AMBULATORY_CARE_PROVIDER_SITE_OTHER): Payer: Medicare Other | Admitting: *Deleted

## 2013-04-02 VITALS — BP 100/78 | HR 61 | Wt 166.0 lb

## 2013-04-02 DIAGNOSIS — I4891 Unspecified atrial fibrillation: Secondary | ICD-10-CM

## 2013-04-02 NOTE — Progress Notes (Signed)
States that she is feeling better since cutting Metoprolol in half to 25 mg.  No complaints at this time.

## 2013-04-05 ENCOUNTER — Encounter: Payer: Self-pay | Admitting: Vascular Surgery

## 2013-04-06 ENCOUNTER — Ambulatory Visit (INDEPENDENT_AMBULATORY_CARE_PROVIDER_SITE_OTHER): Payer: Medicare Other | Admitting: Vascular Surgery

## 2013-04-06 ENCOUNTER — Encounter: Payer: Self-pay | Admitting: Vascular Surgery

## 2013-04-06 ENCOUNTER — Ambulatory Visit: Payer: Medicare Other | Admitting: Vascular Surgery

## 2013-04-06 ENCOUNTER — Encounter (INDEPENDENT_AMBULATORY_CARE_PROVIDER_SITE_OTHER): Payer: Medicare Other | Admitting: Vascular Surgery

## 2013-04-06 VITALS — BP 98/63 | HR 60 | Resp 16 | Ht 67.0 in | Wt 160.0 lb

## 2013-04-06 DIAGNOSIS — I83893 Varicose veins of bilateral lower extremities with other complications: Secondary | ICD-10-CM

## 2013-04-06 DIAGNOSIS — M79609 Pain in unspecified limb: Secondary | ICD-10-CM

## 2013-04-06 DIAGNOSIS — M7989 Other specified soft tissue disorders: Secondary | ICD-10-CM

## 2013-04-06 DIAGNOSIS — R609 Edema, unspecified: Secondary | ICD-10-CM

## 2013-04-06 DIAGNOSIS — R600 Localized edema: Secondary | ICD-10-CM | POA: Insufficient documentation

## 2013-04-06 NOTE — Progress Notes (Signed)
Subjective:     Patient ID: Hailey Graham, female   DOB: [birth year], 79 y.o.   MRN: [id number]  HPI this 79 year old female returns for evaluation of edema and right lower extremity below the knee. She is previously undergone laser ablation of the right great saphenous vein by me in the past 12 months. She also had multiple stab phlebectomy performed. She states that she is continuing to have some chronic swelling particularly in the calf and ankle area. She does not wear elastic compression stockings nor elevate her legs on a regular basis. She has no specific complaints in the contralateral left leg.  Past Medical History  Diagnosis Date  . Paroxysmal atrial fibrillation     Rate related right bundle branch block; normal EF on echo in 2006; normal stress nuclear-2006; mild RV hypokinesis on MRI;  RA and RV enlargement on CT in 9/06; Anticoagulation->discontinued in 2009  . Gastroesophageal reflux disease   . Hyperlipidemia     Lipid profile in 05/2010:149, 86, 48, 84  . Glaucoma(365)   . Diverticulosis   . Lung nodule     Right lower lobe; stable in 2010    History  Substance Use Topics  . Smoking status: Never Smoker   . Smokeless tobacco: Never Used  . Alcohol Use: No    No family history on file.  Allergies  Allergen Reactions  . Codeine   . Morphine   . Sulfa Drugs Cross Reactors     Current outpatient prescriptions:ALPRAZolam (XANAX) 0.5 MG tablet, Take 0.5 mg by mouth as needed. Uses 1-2 per week sometimes every 2-3 weeks , Disp: , Rfl: ;  bimatoprost (LUMIGAN) 0.01 % SOLN, Place 1 drop into both eyes at bedtime., Disp: , Rfl: ;  dorzolamide (TRUSOPT) 2 % ophthalmic solution, Place 1 drop into both eyes 2 (two) times daily., Disp: , Rfl:  ezetimibe-simvastatin (VYTORIN) 10-20 MG per tablet, Take 1 tablet by mouth at bedtime., Disp: 30 tablet, Rfl: 6;  latanoprost (XALATAN) 0.005 % ophthalmic solution, , Disp: , Rfl: ;  metoprolol succinate (TOPROL-XL) 50 MG 24 hr tablet,  Take 25 mg by mouth daily. Take with or immediately following a meal., Disp: , Rfl: ;  pantoprazole (PROTONIX) 40 MG tablet, Take 40 mg by mouth daily. , Disp: , Rfl:  prednisoLONE acetate (PRED FORTE) 1 % ophthalmic suspension, Place 1 drop into the left eye 4 (four) times daily., Disp: , Rfl: ;  Rivaroxaban (XARELTO) 20 MG TABS, Take 1 tablet (20 mg total) by mouth daily., Disp: 30 tablet, Rfl: 30;  brimonidine (ALPHAGAN) 0.2 % ophthalmic solution, , Disp: , Rfl:   BP 98/63  Pulse 60  Resp 16  Ht 5\' 7"  (1.702 m)  Wt 160 lb (72.576 kg)  BMI 25.05 kg/m2  Body mass index is 25.05 kg/(m^2).           Review of Systems denies chest pain, dyspnea on exertion, PND orthopnea. Has been recently diagnosed with atrial fibrillation and takes Xeralto    Objective:   Physical Exam blood pressure 90/63 heart rate 60 respirations 16 General well-developed well-nourished elderly female in no apparent distress alert and oriented x3 Right lower extremity with 3+ femoral and dorsalis pedis pulse palpable. 1+ edema below the knee. Scattered reticular and spider veins particularly in the lower third of the leg and ankle. No active ulcers noted.  Today I ordered a venous duplex exam of the right leg which I reviewed and interpreted. The right great saphenous vein remains closed  up to near the saphenofemoral junction and there is no DVT. There is  reflux in the deep venous     Assessment:     Persistent edema right leg following laser ablation right great saphenous vein do 2 deep venous reflux    Plan:     Have recommended elevation of legs at night and placement of short leg elastic compression stockings in a.m.

## 2013-04-07 ENCOUNTER — Telehealth: Payer: Self-pay | Admitting: *Deleted

## 2013-04-07 NOTE — Telephone Encounter (Signed)
Received phone call from [facility] at Dr Juanetta Gosling office stating that, despite the decrease to 25 mg on her Metoprolol, she is still feeling weak with a 90/50 blood pressure.  Was advised that they will probably take her off of Metoprolol for now.  Blood pressure check scheduled for next Friday to reassess pressure and symptoms.

## 2013-04-08 ENCOUNTER — Telehealth: Payer: Self-pay | Admitting: *Deleted

## 2013-04-08 NOTE — Progress Notes (Signed)
Patient ID: Hailey Graham, female   DOB: 12/16/1928, 79 y.o.   MRN: [id number]  Agree with current plan.

## 2013-04-08 NOTE — Telephone Encounter (Signed)
Patient called stating that Dr Juanetta Gosling changed her Toprol 25 mg to Lopressor 12.5 mg in the evening.  EPIC updated to reflect this change.

## 2013-04-15 ENCOUNTER — Telehealth: Payer: Self-pay | Admitting: Cardiology

## 2013-04-15 NOTE — Telephone Encounter (Signed)
Patient brought in BP reading and cancelled appointment for BP check on 04/16/13.  BP was 106/72 and HR was 72 on 04/15/13.  Patient started Toprol 12.5mg  on Wednesday 04/07/13. / tgs

## 2013-04-15 NOTE — Telephone Encounter (Signed)
FYI: the following pt decision

## 2013-04-16 NOTE — Telephone Encounter (Signed)
Tried to call pt times 3 noted number busy, will attempt again

## 2013-04-16 NOTE — Telephone Encounter (Signed)
Continue current Rx.

## 2013-04-19 NOTE — Telephone Encounter (Signed)
Spoke to pt to advise results/instructions. Pt understood.  

## 2013-05-24 ENCOUNTER — Ambulatory Visit (INDEPENDENT_AMBULATORY_CARE_PROVIDER_SITE_OTHER): Payer: Medicare Other | Admitting: *Deleted

## 2013-05-24 ENCOUNTER — Encounter: Payer: Self-pay | Admitting: Cardiology

## 2013-05-24 ENCOUNTER — Other Ambulatory Visit: Payer: Self-pay | Admitting: Cardiology

## 2013-05-24 DIAGNOSIS — Z7901 Long term (current) use of anticoagulants: Secondary | ICD-10-CM

## 2013-05-24 DIAGNOSIS — I480 Paroxysmal atrial fibrillation: Secondary | ICD-10-CM

## 2013-05-24 DIAGNOSIS — I4891 Unspecified atrial fibrillation: Secondary | ICD-10-CM

## 2013-05-24 LAB — CBC
Hemoglobin: 13.3 g/dL (ref 12.0–15.0)
MCHC: 32.9 g/dL (ref 30.0–36.0)
RDW: 15 % (ref 11.5–15.5)
WBC: 5 10*3/uL (ref 4.0–10.5)

## 2013-05-24 LAB — BASIC METABOLIC PANEL
BUN: 18 mg/dL (ref 6–23)
Calcium: 9.9 mg/dL (ref 8.4–10.5)
Chloride: 107 mEq/L (ref 96–112)
Creat: 0.82 mg/dL (ref 0.50–1.10)

## 2013-05-25 ENCOUNTER — Encounter: Payer: Self-pay | Admitting: *Deleted

## 2013-05-25 ENCOUNTER — Encounter: Payer: Self-pay | Admitting: Cardiology

## 2013-05-26 NOTE — Patient Instructions (Addendum)
Pt was started on Xarelto for Atrial fib on 02/19/13 by Dr Dietrich Pates .    Reviewed patients medication list.  Pt is not currently on any combined P-gp and strong CYP3A4 inhibitors/inducers (ketoconazole, traconazole, ritonavir, carbamazepine, phenytoin, rifampin, St. John's wort).  Reviewed labs from 05/24/13.  SCr 0.82, Weight 170, CrCl 50.22.  Dose appropriate based on CrCl.   Hgb and HCT WNL:  Hgb 13.3  Hct 40.4  A full discussion of the nature of anticoagulants has been carried out.  A benefit/risk analysis has been presented to the patient, so that they understand the justification for choosing anticoagulation with Xarelto at this time.  The need for compliance is stressed.  Pt is aware to take the medication once daily with the largest meal of the day.  Side effects of potential bleeding are discussed, including unusual colored urine or stools, coughing up blood or coffee ground emesis, nose bleeds or serious fall or head trauma.  Discussed signs and symptoms of stroke. The patient should avoid any OTC items containing aspirin or ibuprofen.  Avoid alcohol consumption.   Call if any signs of abnormal bleeding.  Discussed financial obligations and resolved any difficulty in obtaining medication.  Next lab test test in 6 months.

## 2013-05-27 ENCOUNTER — Encounter: Payer: Self-pay | Admitting: *Deleted

## 2013-06-17 ENCOUNTER — Ambulatory Visit (INDEPENDENT_AMBULATORY_CARE_PROVIDER_SITE_OTHER): Payer: Medicare Other | Admitting: Cardiology

## 2013-06-17 ENCOUNTER — Encounter: Payer: Self-pay | Admitting: Cardiology

## 2013-06-17 VITALS — BP 95/66 | HR 69 | Ht 67.0 in | Wt 170.5 lb

## 2013-06-17 DIAGNOSIS — Z7901 Long term (current) use of anticoagulants: Secondary | ICD-10-CM

## 2013-06-17 DIAGNOSIS — I4891 Unspecified atrial fibrillation: Secondary | ICD-10-CM

## 2013-06-17 DIAGNOSIS — I480 Paroxysmal atrial fibrillation: Secondary | ICD-10-CM

## 2013-06-17 NOTE — Assessment & Plan Note (Signed)
Patient is generally doing well with no apparent bleeding. Monitoring for occult blood loss has been negative. The significance of normal coagulation studies is uncertain, but there is no experience in adjusting rivaroxaban dosage based upon testing. We will assume that trial data indicating good protection for thromboembolism is accurate in her case and continue to supervise her anticoagulation therapy.

## 2013-06-17 NOTE — Assessment & Plan Note (Signed)
Event recording revealed episodes of atrial fibrillation that were not associated with symptoms. Patient's report of occasional dyspnea and weakness could be related to recurrent arrhythmia. In the absence of more profound symptoms, no further testing or change in therapy will be undertaken. She's been treated with a minimal dose of beta blocker, but is concerned about low normal blood pressure, and I am not inclined to provide additional AV node blockade either with a higher dose of metoprolol or with an additional medication.

## 2013-06-17 NOTE — Progress Notes (Deleted)
Name: Hailey Graham    DOB: 25-Nov-1928  Age: 79 y.o.  MR#: [id number]       PCP:  Fredirick Maudlin, MD      Insurance: Payor: [employer] [place] MEDICARE / Plan: BLUE MEDICARE / Product Type: *No Product type* /   CC:   No chief complaint on file.  BOTTLES VS Filed Vitals:   06/17/13 1135  BP: 95/66  Pulse: 69  Height: 5\' 7"  (1.702 m)  Weight: 170 lb 8 oz (77.338 kg)    Weights Current Weight  06/17/13 170 lb 8 oz (77.338 kg)  04/06/13 160 lb (72.576 kg)  04/02/13 166 lb (75.297 kg)    Blood Pressure  BP Readings from Last 3 Encounters:  06/17/13 95/66  04/06/13 98/63  04/02/13 100/78     Admit date:  (Not on file) Last encounter with RMR:  04/15/2013   Allergy Codeine; Morphine; and Sulfa drugs cross reactors  Current Outpatient Prescriptions  Medication Sig Dispense Refill  . ALPRAZolam (XANAX) 0.5 MG tablet Take 0.5 mg by mouth as needed. Uses 1-2 per week sometimes every 2-3 weeks       . bimatoprost (LUMIGAN) 0.01 % SOLN Place 1 drop into the right eye at bedtime.       . dorzolamide (TRUSOPT) 2 % ophthalmic solution Place 1 drop into the right eye 2 (two) times daily.       Marland Kitchen ezetimibe-simvastatin (VYTORIN) 10-20 MG per tablet Take 1 tablet by mouth at bedtime.  30 tablet  6  . metoprolol tartrate (LOPRESSOR) 25 MG tablet Take 12.5 mg by mouth every evening.      . pantoprazole (PROTONIX) 40 MG tablet Take 40 mg by mouth daily.       . Rivaroxaban (XARELTO) 20 MG TABS Take 1 tablet (20 mg total) by mouth daily.  30 tablet  30   No current facility-administered medications for this visit.    Discontinued Meds:    Medications Discontinued During This Encounter  Medication Reason  . brimonidine (ALPHAGAN) 0.2 % ophthalmic solution Error  . latanoprost (XALATAN) 0.005 % ophthalmic solution Error  . prednisoLONE acetate (PRED FORTE) 1 % ophthalmic suspension Error    Patient Active Problem List   Diagnosis Date Noted  . Leg edema, right 04/06/2013   . Hx of diagnostic tests 03/22/2013  . Chronic anticoagulation 02/19/2013  . Paroxysmal atrial fibrillation   . Gastroesophageal reflux disease   . Hyperlipidemia   . Varicose veins of lower extremities with other complications 02/18/2012    LABS    Component Value Date/Time   NA 142 05/24/2013 1223   NA 140 03/19/2013 1439   NA 142 07/15/2012 1405   K 4.1 05/24/2013 1223   K 4.0 03/19/2013 1439   K 3.9 07/15/2012 1405   CL 107 05/24/2013 1223   CL 106 03/19/2013 1439   CL 109 07/15/2012 1405   CO2 26 05/24/2013 1223   CO2 27 03/19/2013 1439   CO2 27 07/15/2012 1405   GLUCOSE 87 05/24/2013 1223   GLUCOSE 82 03/19/2013 1439   GLUCOSE 80 07/15/2012 1405   BUN 18 05/24/2013 1223   BUN 23 03/19/2013 1439   BUN 23 07/15/2012 1405   CREATININE 0.82 05/24/2013 1223   CREATININE 0.80 03/19/2013 1439   CREATININE 0.72 07/15/2012 1405   CREATININE 0.78 05/26/2010 1822   CREATININE 0.78 05/26/2010   CALCIUM 9.9 05/24/2013 1223   CALCIUM 9.9 03/19/2013 1439   CALCIUM 9.6 07/15/2012 1405  CMP     Component Value Date/Time   NA 142 05/24/2013 1223   K 4.1 05/24/2013 1223   CL 107 05/24/2013 1223   CO2 26 05/24/2013 1223   GLUCOSE 87 05/24/2013 1223   BUN 18 05/24/2013 1223   CREATININE 0.82 05/24/2013 1223   CREATININE 0.78 05/26/2010 1822   CALCIUM 9.9 05/24/2013 1223   PROT 6.6 03/19/2013 1439   ALBUMIN 3.9 03/19/2013 1439   AST 17 03/19/2013 1439   ALT 20 03/19/2013 1439   ALKPHOS 70 03/19/2013 1439   BILITOT 0.3 03/19/2013 1439       Component Value Date/Time   WBC 5.0 05/24/2013 1223   WBC 5.8 03/19/2013 1439   WBC 4.7 07/15/2012 1405   HGB 13.3 05/24/2013 1223   HGB 13.3 03/19/2013 1439   HGB 13.5 07/15/2012 1405   HCT 40.4 05/24/2013 1223   HCT 40.7 03/19/2013 1439   HCT 40.3 07/15/2012 1405   MCV 86.0 05/24/2013 1223   MCV 86.8 03/19/2013 1439   MCV 86.3 07/15/2012 1405    Lipid Panel     Component Value Date/Time   CHOL 145 07/15/2012 1405   TRIG 61 07/15/2012 1405   HDL 43 07/15/2012 1405   CHOLHDL 3.4 07/15/2012 1405    VLDL 12 07/15/2012 1405   LDLCALC 90 07/15/2012 1405    ABG No results found for this basename: phart, pco2, pco2art, po2, po2art, hco3, tco2, acidbasedef, o2sat     Lab Results  Component Value Date   TSH 2.207 01/29/2013   BNP (last 3 results) No results found for this basename: PROBNP,  in the last 8760 hours Cardiac Panel (last 3 results) No results found for this basename: CKTOTAL, CKMB, TROPONINI, RELINDX,  in the last 72 hours  Iron/TIBC/Ferritin No results found for this basename: iron, tibc, ferritin     EKG Orders placed in visit on 03/15/13  . EKG 12-LEAD     Prior Assessment and Plan Problem List as of 06/17/2013   Varicose veins of lower extremities with other complications   Paroxysmal atrial fibrillation   Last Assessment & Plan   02/19/2013 Office Visit Edited 02/20/2013 10:48 PM by Kathlen Brunswick, MD     Patient has intermittent and asymptomatic atrial fibrillation. Although no episode lasted as long as one day, multiple spells persisting for a number of hours indicate significant risk for thromboembolism. Patient's risk factors for same include age and gender. Anticoagulation is warranted. Options were discussed with the patient and rivaroxaban recommended.  Metoprolol will be started in an attempt to better control the ventricular rate in atrial fibrillation.    Gastroesophageal reflux disease   Last Assessment & Plan   07/15/2012 Office Visit Written 07/15/2012  7:52 PM by Kathlen Brunswick, MD     Adequately managed with occasional as needed PPI.    Hyperlipidemia   Last Assessment & Plan   02/19/2013 Office Visit Edited 02/20/2013 10:46 PM by Kathlen Brunswick, MD     Control of hyperlipidemia is superb.  An agent as potent as Vytorin probably is not necessary, and atorvastatin at moderate dose would likely be adequate. Dr. Juanetta Gosling manages hyperlipidemia and will adjust therapy as he deems appropriate.    Chronic anticoagulation   Hx of diagnostic tests    Leg edema, right       Imaging: No results found.

## 2013-06-17 NOTE — Patient Instructions (Addendum)
Your physician recommends that you schedule a follow-up appointment in: 8 MONTHS DR K

## 2013-06-17 NOTE — Progress Notes (Signed)
Patient ID: AUSTINA CONSTANTIN, female   DOB: 14-Apr-1928, 79 y.o.   MRN: [id number]  HPI: Scheduled return visit for this delightful woman with paroxysmal atrial fibrillation requiring anticoagulation. Rivaroxaban was started a few months ago and has resulted in no apparent adverse effects. PT and PTT remain normal following the institution of therapy. Ms. Nawaz experiences brief episodes of dyspnea, typically at rest, that lasts for a matter of seconds and resolve spontaneously.  Patient expresses concern about low blood pressure with a systolic of 95 measured today, but has no symptoms suggestive of cerebral hypoperfusion.  Current Outpatient Prescriptions  Medication Sig Dispense Refill  . ALPRAZolam (XANAX) 0.5 MG tablet Take 0.5 mg by mouth as needed. Uses 1-2 per week sometimes every 2-3 weeks       . bimatoprost (LUMIGAN) 0.01 % SOLN Place 1 drop into the right eye at bedtime.       . dorzolamide (TRUSOPT) 2 % ophthalmic solution Place 1 drop into the right eye 2 (two) times daily.       Marland Kitchen ezetimibe-simvastatin (VYTORIN) 10-20 MG per tablet Take 1 tablet by mouth at bedtime.  30 tablet  6  . metoprolol tartrate (LOPRESSOR) 25 MG tablet Take 12.5 mg by mouth every evening.      . pantoprazole (PROTONIX) 40 MG tablet Take 40 mg by mouth daily.       . Rivaroxaban (XARELTO) 20 MG TABS Take 1 tablet (20 mg total) by mouth daily.  30 tablet  30   No current facility-administered medications for this visit.   Allergies  Allergen Reactions  . Codeine   . Morphine   . Sulfa Drugs Cross Reactors      Past medical history, social history, and family history reviewed and updated.  ROS: Denies chest discomfort, exercise intolerance, palpitations, lightheadedness or syncope. She does note episodes of weakness associated with gait instability, but has not fallen. All other systems reviewed and are negative.  PHYSICAL EXAM: BP 95/66  Pulse 69  Ht 5\' 7"  (1.702 m)  Wt 77.338 kg (170 lb 8 oz)  BMI 26.7  kg/m2;  Body mass index is 26.7 kg/(m^2). General-Well developed; no acute distress Body habitus-proportionate weight and height Neck-No JVD; no carotid bruits Lungs-clear lung fields; resonant to percussion Cardiovascular-normal PMI; normal S1 and S2; 1-2/6 systolic ejection murmur Abdomen-normal bowel sounds; soft and non-tender without masses or organomegaly Musculoskeletal-No deformities, no cyanosis or clubbing Neurologic-Normal cranial nerves; symmetric strength and tone Skin-Warm, no significant lesions Extremities-distal pulses intact; no edema  [place], MD 06/17/2013  1:01 PM  ASSESSMENT AND PLAN

## 2013-09-23 ENCOUNTER — Telehealth: Payer: Self-pay | Admitting: Adult Health

## 2013-09-23 NOTE — Telephone Encounter (Signed)
Patient has concerns regarding taking Xarelto / tgs

## 2013-09-23 NOTE — Telephone Encounter (Signed)
Pt concerned with drug ads on tv, advised per doctors, [facility] coumadin nurse, the big name companies tend to have attacks once a new product is produced, however our MD's do not have any concerns with this medication at this time and do not have any studies to back up the concerns the ads are advising, pt declined apt to discuss further in OV, pt will continue to take medication and call back to office with any further questions if noted

## 2013-11-04 ENCOUNTER — Other Ambulatory Visit (HOSPITAL_COMMUNITY): Payer: Self-pay | Admitting: Pulmonary Disease

## 2013-11-04 DIAGNOSIS — R2689 Other abnormalities of gait and mobility: Secondary | ICD-10-CM

## 2013-11-09 ENCOUNTER — Ambulatory Visit (HOSPITAL_COMMUNITY)
Admission: RE | Admit: 2013-11-09 | Discharge: 2013-11-09 | Disposition: A | Discharge status: Home / Self Care | Payer: Medicare Other | Source: Ambulatory Visit | Attending: Pulmonary Disease | Admitting: Pulmonary Disease

## 2013-11-09 DIAGNOSIS — R279 Unspecified lack of coordination: Secondary | ICD-10-CM | POA: Insufficient documentation

## 2013-11-09 DIAGNOSIS — R51 Headache: Secondary | ICD-10-CM | POA: Insufficient documentation

## 2013-11-09 DIAGNOSIS — I672 Cerebral atherosclerosis: Secondary | ICD-10-CM | POA: Insufficient documentation

## 2013-11-09 DIAGNOSIS — R2689 Other abnormalities of gait and mobility: Secondary | ICD-10-CM

## 2013-12-14 ENCOUNTER — Encounter: Payer: Self-pay | Admitting: Cardiovascular Disease

## 2013-12-14 ENCOUNTER — Ambulatory Visit (INDEPENDENT_AMBULATORY_CARE_PROVIDER_SITE_OTHER): Payer: Medicare Other | Admitting: Cardiovascular Disease

## 2013-12-14 VITALS — BP 125/62 | HR 64 | Ht 67.0 in | Wt 171.0 lb

## 2013-12-14 DIAGNOSIS — E785 Hyperlipidemia, unspecified: Secondary | ICD-10-CM

## 2013-12-14 DIAGNOSIS — I4891 Unspecified atrial fibrillation: Secondary | ICD-10-CM

## 2013-12-14 DIAGNOSIS — I480 Paroxysmal atrial fibrillation: Secondary | ICD-10-CM

## 2013-12-14 DIAGNOSIS — Z7901 Long term (current) use of anticoagulants: Secondary | ICD-10-CM

## 2013-12-14 NOTE — Patient Instructions (Signed)
Your physician wants you to follow-up in: 6 MONTHS You will receive a reminder letter in the mail two months in advance. If you don't receive a letter, please call our office to schedule the follow-up appointment. 

## 2013-12-14 NOTE — Progress Notes (Signed)
Patient ID: Hailey Graham, female   DOB: 1928/03/25, 79 y.o.   MRN: [id number]      SUBJECTIVE: The patient is an 79 year old woman who has a cardiovascular history significant for paroxysmal atrial fibrillation for which she is on chronic anticoagulation therapy with rivaroxaban. Her husband Reita Cliche) is my patient as well. She has been undergoing a lot of stress due to her husband being in constant back pain after his surgery. She is tearful when talking about it. She very seldom has palpitations and also very seldom has shortness of breath. She denies chest pain and leg swelling. She did not tolerate metoprolol 25 mg daily due to low blood pressures, so her PCP (Dr. Juanetta Gosling) reduced at 12.5 mg daily. Her blood pressure today is 125/62 and her heart rate is 64. She relies on her strong faith.    Allergies  Allergen Reactions  . Codeine   . Morphine   . Sulfa Drugs Cross Reactors     Current Outpatient Prescriptions  Medication Sig Dispense Refill  . ALPRAZolam (XANAX) 0.5 MG tablet Take 0.5 mg by mouth as needed. Uses 1-2 per week sometimes every 2-3 weeks       . bimatoprost (LUMIGAN) 0.01 % SOLN Place 1 drop into the right eye at bedtime.       . dorzolamide (TRUSOPT) 2 % ophthalmic solution Place 1 drop into the right eye 2 (two) times daily.       Marland Kitchen ezetimibe-simvastatin (VYTORIN) 10-20 MG per tablet Take 1 tablet by mouth at bedtime.  30 tablet  6  . metoprolol tartrate (LOPRESSOR) 25 MG tablet Take 12.5 mg by mouth every evening.      . pantoprazole (PROTONIX) 40 MG tablet Take 40 mg by mouth daily.       . Rivaroxaban (XARELTO) 20 MG TABS Take 1 tablet (20 mg total) by mouth daily.  30 tablet  30   No current facility-administered medications for this visit.    Past Medical History  Diagnosis Date  . Paroxysmal atrial fibrillation     Rate related right bundle branch block; normal EF on echo in 2006; normal stress nuclear-2006; mild RV hypokinesis on MRI;  RA and RV  enlargement on CT in 9/06; Anticoagulation->discontinued in 2009  . Gastroesophageal reflux disease   . Hyperlipidemia     Lipid profile in 05/2010:149, 86, 48, 84  . Glaucoma   . Diverticulosis   . Lung nodule     Right lower lobe; stable in 2010    Past Surgical History  Procedure Laterality Date  . Esophagogastroduodenoscopy    . Appendectomy    . Abdominal hysterectomy    . Hernia repair    . Orif ankle fracture  2003    Right    History   Social History  . Marital Status: Married    Spouse Name: N/A    Number of Children: N/A  . Years of Education: N/A   Occupational History  . Retired    Social History Main Topics  . Smoking status: Never Smoker   . Smokeless tobacco: Never Used  . Alcohol Use: No  . Drug Use: No  . Sexual Activity: Not on file   Other Topics Concern  . Not on file   Social History Narrative  . No narrative on file     Filed Vitals:   12/14/13 0826  BP: 125/62  Pulse: 64  Height: 5\' 7"  (1.702 m)  Weight: 171 lb (77.565 kg)  PHYSICAL EXAM General: NAD Neck: No JVD, no thyromegaly or thyroid nodule.  Lungs: Clear to auscultation bilaterally with normal respiratory effort. CV: Nondisplaced PMI.  Heart regular S1/S2, no S3/S4, no murmur.  No peripheral edema.  No carotid bruit.  Normal pedal pulses.  Abdomen: Soft, nontender, no hepatosplenomegaly, no distention.  Neurologic: Alert and oriented x 3.  Psych: Normal affect. Extremities: No clubbing or cyanosis.   ECG: reviewed and available in electronic records.      ASSESSMENT AND PLAN: 1. Paroxysmal atrial fibrillation: she is doing well from this standpoint, and is in a regular rhythm today. No changes in therapy. She is tolerating rivaroxaban, albeit it is very expensive. We will provide her with samples. 2. Hyperlipidemia: she is on Vytorin, and her lipids from 02/16/13 show TG 66, HDL 80, LDL 71. AST/ALT were 16/15 respectively.  Dispo: f/u 6 months.   Prentice Docker, M.D., F.A.C.C.

## 2013-12-27 ENCOUNTER — Ambulatory Visit: Payer: Medicare Other | Admitting: Cardiovascular Disease

## 2014-01-14 ENCOUNTER — Encounter: Payer: Self-pay | Admitting: Obstetrics and Gynecology

## 2014-03-09 ENCOUNTER — Telehealth: Payer: Self-pay | Admitting: Cardiovascular Disease

## 2014-03-09 NOTE — Telephone Encounter (Signed)
Agree with assessment. No further changes.

## 2014-03-09 NOTE — Telephone Encounter (Signed)
Spoke with pt, she will make sure to contact us if she has any changes.

## 2014-03-09 NOTE — Telephone Encounter (Signed)
Spoke with pt, she lost her balance an fell last week. She had a little knot on her forehead but it was gone the next day. She has bruising on her face. She denies any kind of symptoms. Explained gravity will cause the bruising to move down the face. She will cont on the xarelto, explained the concern would be any bleeding from the hit. She again denies any symptoms at all. Will forward for dr Bronson Ing review

## 2014-03-09 NOTE — Telephone Encounter (Signed)
Patient fell against wall on Thursday of last week.  Bumped her head on the wall.  Has bruise that has spread down to her eye.  Patient states that she has had no headache, no bleeding, no dizziness.  She is on Xarelto and just wants to know if there is anything she needs to do.  Please return call. / tgs

## 2014-03-15 ENCOUNTER — Ambulatory Visit (INDEPENDENT_AMBULATORY_CARE_PROVIDER_SITE_OTHER): Payer: Medicare Other | Admitting: Urology

## 2014-03-15 DIAGNOSIS — R82998 Other abnormal findings in urine: Secondary | ICD-10-CM

## 2014-04-27 ENCOUNTER — Encounter: Payer: Self-pay | Admitting: Obstetrics and Gynecology

## 2014-04-28 ENCOUNTER — Encounter: Payer: Self-pay | Admitting: *Deleted

## 2014-04-28 ENCOUNTER — Telehealth: Payer: Self-pay | Admitting: Cardiovascular Disease

## 2014-04-28 ENCOUNTER — Other Ambulatory Visit: Payer: Self-pay | Admitting: *Deleted

## 2014-04-28 DIAGNOSIS — R710 Precipitous drop in hematocrit: Secondary | ICD-10-CM

## 2014-04-28 LAB — CBC
HEMATOCRIT: 40.4 % (ref 36.0–46.0)
Hemoglobin: 13.2 g/dL (ref 12.0–15.0)
MCH: 29.4 pg (ref 26.0–34.0)
MCHC: 32.7 g/dL (ref 30.0–36.0)
MCV: 90 fL (ref 78.0–100.0)
Platelets: 228 10*3/uL (ref 150–400)
RBC: 4.49 MIL/uL (ref 3.87–5.11)
RDW: 15.1 % (ref 11.5–15.5)
WBC: 5.8 10*3/uL (ref 4.0–10.5)

## 2014-04-28 NOTE — Telephone Encounter (Signed)
FYI

## 2014-04-28 NOTE — Telephone Encounter (Signed)
Ordered CBC stat on pt. Per Dr Bronson Ing. CBC came back normal. Pt is to continue Xarelto.  Made pt aware

## 2014-04-28 NOTE — Telephone Encounter (Signed)
Patient states that she went to PCP on 04/27/14 due to having "a lot of blood in her urine".  States that she was advised to let Cardiologist know due to her being on Xarelto. / tgs

## 2014-05-03 ENCOUNTER — Ambulatory Visit (HOSPITAL_COMMUNITY)
Admission: RE | Admit: 2014-05-03 | Discharge: 2014-05-03 | Disposition: A | Discharge status: Home / Self Care | Payer: Medicare Other | Source: Ambulatory Visit | Attending: Urology | Admitting: Urology

## 2014-05-03 ENCOUNTER — Ambulatory Visit (INDEPENDENT_AMBULATORY_CARE_PROVIDER_SITE_OTHER): Payer: Medicare Other | Admitting: Urology

## 2014-05-03 ENCOUNTER — Other Ambulatory Visit: Payer: Self-pay | Admitting: Urology

## 2014-05-03 DIAGNOSIS — K449 Diaphragmatic hernia without obstruction or gangrene: Secondary | ICD-10-CM | POA: Insufficient documentation

## 2014-05-03 DIAGNOSIS — N200 Calculus of kidney: Secondary | ICD-10-CM

## 2014-05-03 DIAGNOSIS — I998 Other disorder of circulatory system: Secondary | ICD-10-CM | POA: Insufficient documentation

## 2014-05-03 DIAGNOSIS — I700 Atherosclerosis of aorta: Secondary | ICD-10-CM | POA: Insufficient documentation

## 2014-05-03 DIAGNOSIS — K429 Umbilical hernia without obstruction or gangrene: Secondary | ICD-10-CM | POA: Insufficient documentation

## 2014-05-03 DIAGNOSIS — K7689 Other specified diseases of liver: Secondary | ICD-10-CM | POA: Insufficient documentation

## 2014-05-03 DIAGNOSIS — K5730 Diverticulosis of large intestine without perforation or abscess without bleeding: Secondary | ICD-10-CM | POA: Insufficient documentation

## 2014-05-03 DIAGNOSIS — M51379 Other intervertebral disc degeneration, lumbosacral region without mention of lumbar back pain or lower extremity pain: Secondary | ICD-10-CM | POA: Insufficient documentation

## 2014-05-03 DIAGNOSIS — N201 Calculus of ureter: Secondary | ICD-10-CM | POA: Insufficient documentation

## 2014-05-03 DIAGNOSIS — R310 Gross hematuria: Secondary | ICD-10-CM

## 2014-05-03 DIAGNOSIS — M5137 Other intervertebral disc degeneration, lumbosacral region: Secondary | ICD-10-CM | POA: Insufficient documentation

## 2014-06-01 ENCOUNTER — Other Ambulatory Visit (INDEPENDENT_AMBULATORY_CARE_PROVIDER_SITE_OTHER): Payer: Self-pay | Admitting: Internal Medicine

## 2014-06-01 DIAGNOSIS — K219 Gastro-esophageal reflux disease without esophagitis: Secondary | ICD-10-CM

## 2014-06-13 ENCOUNTER — Ambulatory Visit: Payer: Medicare Other | Admitting: Cardiovascular Disease

## 2014-06-20 ENCOUNTER — Encounter (INDEPENDENT_AMBULATORY_CARE_PROVIDER_SITE_OTHER): Payer: Self-pay | Admitting: Internal Medicine

## 2014-06-20 ENCOUNTER — Ambulatory Visit (INDEPENDENT_AMBULATORY_CARE_PROVIDER_SITE_OTHER): Payer: Medicare Other | Admitting: Internal Medicine

## 2014-06-20 VITALS — BP 108/74 | HR 64 | Temp 97.6°F | Ht 67.0 in | Wt 174.5 lb

## 2014-06-20 DIAGNOSIS — K219 Gastro-esophageal reflux disease without esophagitis: Secondary | ICD-10-CM

## 2014-06-20 MED ORDER — PANTOPRAZOLE SODIUM 40 MG PO TBEC: 40.0000 mg | DELAYED_RELEASE_TABLET | Freq: Every day | ORAL | Status: DC

## 2014-06-20 NOTE — Progress Notes (Signed)
Subjective:     Patient ID: Hailey Graham, female   DOB: 28-Dec-1927, 80 y.o.   MRN: [id number]  HPIHere today for f/u of her GERD. She tells me she is doing good. She tells me she rarely has acid reflux. She takes her Protonix may three times a week. She eats anything she wants without any problem. Appetite is good. No weight loss. She usually has a BM about 2-3 days. Sometimes she does get constipated. She occasionally she will take Doculax and this helps.  She eats lots of fruit and fiber.  06/19/2011 EGD: Abnormal CT: FINAL DIAGNOSES:  1. Two small submucosal lesions at gastric body along the lesser  curvature. Proximal of these 2 lesions is about 10-mm in maximal  diameter and corresponds to CT finding. I suspect this is a  leiomyoma. There is a smaller one just distal to it not seen on  CT.  2. Few hyperplastic-appearing gastric polyps at body two of which were  biopsied for histology.  3. Erosive antral gastritis.  4. Small lipoma involving second part of the duodenum.  Biopsy: Benign gastric mucosa.    Review of Systems Past Medical History  Diagnosis Date  . Paroxysmal atrial fibrillation     Rate related right bundle branch block; normal EF on echo in 2006; normal stress nuclear-2006; mild RV hypokinesis on MRI;  RA and RV enlargement on CT in 9/06; Anticoagulation->discontinued in 2009  . Gastroesophageal reflux disease   . Hyperlipidemia     Lipid profile in 05/2010:149, 86, 48, 84  . Glaucoma   . Diverticulosis   . Lung nodule     Right lower lobe; stable in 2010    Past Surgical History  Procedure Laterality Date  . Esophagogastroduodenoscopy    . Appendectomy    . Abdominal hysterectomy    . Hernia repair    . Orif ankle fracture  2003    Right    Allergies  Allergen Reactions  . Codeine   . Morphine   . Sulfa Drugs Cross Reactors     Current Outpatient Prescriptions on File Prior to Visit  Medication Sig Dispense Refill  . ALPRAZolam (XANAX) 0.5 MG  tablet Take 0.5 mg by mouth as needed. Uses 1-2 per week sometimes every 2-3 weeks       . bimatoprost (LUMIGAN) 0.01 % SOLN Place 1 drop into the right eye at bedtime.       . dorzolamide (TRUSOPT) 2 % ophthalmic solution Place 1 drop into the right eye 2 (two) times daily.       Marland Kitchen ezetimibe-simvastatin (VYTORIN) 10-20 MG per tablet Take 1 tablet by mouth at bedtime.  30 tablet  6  . metoprolol tartrate (LOPRESSOR) 25 MG tablet Take 12.5 mg by mouth every evening.      . pantoprazole (PROTONIX) 40 MG tablet TAKE (1) TABLET BY MOUTH EACH MORNING.  30 tablet  0  . Rivaroxaban (XARELTO) 20 MG TABS Take 1 tablet (20 mg total) by mouth daily.  30 tablet  30   No current facility-administered medications on file prior to visit.        Objective:   Physical Exam  Filed Vitals:   06/20/14 1413  BP: 108/74  Pulse: 64  Temp: 97.6 F (36.4 C)  Height: 5\' 7"  (1.702 m)  Weight: 174 lb 8 oz (79.153 kg)  Alert and oriented. Skin warm and dry. Oral mucosa is moist.   . Sclera anicteric, conjunctivae is pink. Thyroid not enlarged. No  cervical lymphadenopathy. Lungs clear. Heart regular rate and rhythm.  Abdomen is soft. Bowel sounds are positive. No hepatomegaly. No abdominal masses felt. No tenderness.  No edema to lower extremities.        Assessment:    GERD controlled at this time. She seems to be doing well.    Plan:     OV in 1 yr. Rx Protonix 40mg  daily.

## 2014-06-20 NOTE — Patient Instructions (Signed)
OV in one year. Rx Protonix 40mg  sent to pharmacy.

## 2014-07-07 ENCOUNTER — Encounter: Payer: Self-pay | Admitting: Cardiovascular Disease

## 2014-07-25 ENCOUNTER — Ambulatory Visit: Payer: Medicare Other | Admitting: Cardiovascular Disease

## 2014-07-27 ENCOUNTER — Encounter: Payer: Self-pay | Admitting: Cardiovascular Disease

## 2014-07-27 ENCOUNTER — Ambulatory Visit (INDEPENDENT_AMBULATORY_CARE_PROVIDER_SITE_OTHER): Payer: Medicare Other | Admitting: Cardiovascular Disease

## 2014-07-27 VITALS — BP 108/72 | HR 65 | Ht 67.0 in | Wt 172.0 lb

## 2014-07-27 DIAGNOSIS — Z7901 Long term (current) use of anticoagulants: Secondary | ICD-10-CM

## 2014-07-27 DIAGNOSIS — E785 Hyperlipidemia, unspecified: Secondary | ICD-10-CM

## 2014-07-27 DIAGNOSIS — I4891 Unspecified atrial fibrillation: Secondary | ICD-10-CM

## 2014-07-27 NOTE — Patient Instructions (Signed)
Your physician recommends that you schedule a follow-up appointment in: 1 year with Dr Koneswaran You will receive a reminder letter two months in advance reminding you to call and schedule your appointment. If you don't receive this letter, please contact our office.  Your physician recommends that you continue on your current medications as directed. Please refer to the Current Medication list given to you today.     

## 2014-07-27 NOTE — Progress Notes (Signed)
Patient ID: Hailey Graham, female   DOB: 07-24-1928, 80 y.o.   MRN: [id number]      SUBJECTIVE: The patient is an 80 year old woman who has a cardiovascular history significant for paroxysmal atrial fibrillation for which she is on chronic anticoagulation therapy with rivaroxaban. Her husband Mortimer Fries) is my patient as well. She has not been able to tolerate doses higher than 12.5 mg of metoprolol daily in the past. ECG performed in the office today demonstrates atrial fibrillation with an underlying right bundle branch block, heart rate 71 beats per minute.  While she has felt more palpitations in the past few days and realize she might be in atrial fibrillation, she denies any significant problems associated with this. She denies chest pain. She occasionally has some intermittent shortness of breath but never with exertion. She has a lot of difficulty taking care of her husband. She denies dizziness and syncope. She has had issues with her blood sugar dropping but this stabilizes after she eats a meal. She has several questions with respect to her husband's recent hospitalization for diastolic heart failure.   Review of Systems: As per "subjective", otherwise negative.  Allergies  Allergen Reactions  . Codeine   . Morphine   . Sulfa Drugs Cross Reactors     Current Outpatient Prescriptions  Medication Sig Dispense Refill  . ALPRAZolam (XANAX) 0.5 MG tablet Take 0.5 mg by mouth as needed. Uses 1-2 per week sometimes every 2-3 weeks       . bimatoprost (LUMIGAN) 0.01 % SOLN Place 1 drop into the right eye at bedtime.       . dorzolamide (TRUSOPT) 2 % ophthalmic solution Place 1 drop into the right eye 2 (two) times daily.       Marland Kitchen ezetimibe-simvastatin (VYTORIN) 10-20 MG per tablet Take 1 tablet by mouth at bedtime.  30 tablet  6  . metoprolol tartrate (LOPRESSOR) 25 MG tablet Take 12.5 mg by mouth every evening.      . pantoprazole (PROTONIX) 40 MG tablet Take 1 tablet (40 mg total) by  mouth daily.  90 tablet  3  . pantoprazole (PROTONIX) 40 MG tablet Take 1 tablet (40 mg total) by mouth daily.  30 tablet  11  . Rivaroxaban (XARELTO) 20 MG TABS Take 1 tablet (20 mg total) by mouth daily.  30 tablet  30   No current facility-administered medications for this visit.    Past Medical History  Diagnosis Date  . Paroxysmal atrial fibrillation     Rate related right bundle branch block; normal EF on echo in 2006; normal stress nuclear-2006; mild RV hypokinesis on MRI;  RA and RV enlargement on CT in 9/06; Anticoagulation->discontinued in 2009  . Gastroesophageal reflux disease   . Hyperlipidemia     Lipid profile in 05/2010:149, 86, 48, 84  . Glaucoma   . Diverticulosis   . Lung nodule     Right lower lobe; stable in 2010    Past Surgical History  Procedure Laterality Date  . Esophagogastroduodenoscopy    . Appendectomy    . Abdominal hysterectomy    . Hernia repair    . Orif ankle fracture  2003    Right    History   Social History  . Marital Status: Married    Spouse Name: N/A    Number of Children: N/A  . Years of Education: N/A   Occupational History  . Retired    Social History Main Topics  . Smoking status: Never Smoker   .  Smokeless tobacco: Never Used  . Alcohol Use: No  . Drug Use: No  . Sexual Activity: Not on file   Other Topics Concern  . Not on file   Social History Narrative  . No narrative on file     Filed Vitals:   07/27/14 0845  BP: 108/72  Pulse: 65  Height: 5\' 7"  (1.702 m)  Weight: 172 lb (78.019 kg)  SpO2: 95%    PHYSICAL EXAM General: NAD Neck: No JVD, no thyromegaly. Lungs: Clear to auscultation bilaterally with normal respiratory effort. CV: Nondisplaced PMI.  Irregular rhythm, normal S1/S2, no S3, no murmur. No pretibial or periankle edema.  No carotid bruit.  Normal pedal pulses.  Abdomen: Soft, nontender, no hepatosplenomegaly, no distention.  Neurologic: Alert and oriented x 3.  Psych: Normal  affect. Extremities: No clubbing or cyanosis.   ECG: reviewed and available in electronic records.      ASSESSMENT AND PLAN: 1. Paroxysmal atrial fibrillation: Symptomatically stable on low-dose metoprolol and Xarelto.  2. Hyperlipidemia: Will see if lipids were checked by PCP. On Vytorin.  Dispo: f/u 1 year.   Kate Sable, M.D., F.A.C.C.

## 2014-09-21 ENCOUNTER — Telehealth: Payer: Self-pay | Admitting: *Deleted

## 2014-09-21 NOTE — Telephone Encounter (Signed)
GAVE PT 4 BOTTLES OF XARELTO 20 MG

## 2014-09-21 NOTE — Telephone Encounter (Signed)
Pt needs 20 mg xarelto samples. She would like to pick them up at lunch/tmj

## 2014-09-28 ENCOUNTER — Telehealth: Payer: Self-pay | Admitting: Cardiovascular Disease

## 2014-09-28 NOTE — Telephone Encounter (Signed)
Will forward to Dr. Koneswaran  

## 2014-09-28 NOTE — Telephone Encounter (Signed)
Patient went to Dr.Hawkins for dizziness.  He told her to not take Toprol tonight and tomorrow night and return on Friday AM.  BP at time of visit was 89/54.  Just an FYI. / tgs

## 2014-09-30 ENCOUNTER — Telehealth: Payer: Self-pay | Admitting: Cardiovascular Disease

## 2014-09-30 NOTE — Telephone Encounter (Signed)
FYI: Patient went back to Dr.Hawkins' today for f/u.  BP was 108/78 HR was 71.  Was told to not take Toprol Tonight Saturday or Sunday and come back on Monday for BP check / tgs

## 2014-09-30 NOTE — Telephone Encounter (Signed)
Will forward to Dr. Koneswaran as FYI 

## 2014-10-03 ENCOUNTER — Telehealth: Payer: Self-pay | Admitting: Cardiovascular Disease

## 2014-10-03 ENCOUNTER — Other Ambulatory Visit: Payer: Self-pay | Admitting: *Deleted

## 2014-10-03 DIAGNOSIS — I482 Chronic atrial fibrillation, unspecified: Secondary | ICD-10-CM

## 2014-10-03 NOTE — Telephone Encounter (Signed)
Will FYI Dr. Bronson Ing. Gave 5 bottles of Xarelto samples. Pt assistance program still has not sent pt medication. Pt has completed all forms for income and [place] and I have called multiple times. Xarelto rep to fix this in the morning.

## 2014-10-03 NOTE — Telephone Encounter (Signed)
FYI: Patient went back to Dr.Hawkins' for f/u her BP was 112/68 HR was 68.  Still not to take Toprol until next f/u which is Monday 10/19. / tgs

## 2014-10-07 NOTE — Telephone Encounter (Signed)
Addressed in another phone note by staci barker/tmj

## 2014-10-12 ENCOUNTER — Other Ambulatory Visit: Payer: Self-pay | Admitting: Cardiovascular Disease

## 2014-10-12 LAB — BASIC METABOLIC PANEL
BUN: 25 mg/dL — ABNORMAL HIGH (ref 6–23)
CALCIUM: 9.5 mg/dL (ref 8.4–10.5)
CO2: 27 mEq/L (ref 19–32)
Chloride: 108 mEq/L (ref 96–112)
Creat: 0.76 mg/dL (ref 0.50–1.10)
Glucose, Bld: 84 mg/dL (ref 70–99)
POTASSIUM: 4.1 meq/L (ref 3.5–5.3)
SODIUM: 142 meq/L (ref 135–145)

## 2014-10-12 LAB — CBC
HCT: 39.6 % (ref 36.0–46.0)
Hemoglobin: 13 g/dL (ref 12.0–15.0)
MCH: 28.6 pg (ref 26.0–34.0)
MCHC: 32.8 g/dL (ref 30.0–36.0)
MCV: 87 fL (ref 78.0–100.0)
Platelets: 295 10*3/uL (ref 150–400)
RBC: 4.55 MIL/uL (ref 3.87–5.11)
RDW: 15 % (ref 11.5–15.5)
WBC: 5.9 10*3/uL (ref 4.0–10.5)

## 2014-10-13 ENCOUNTER — Telehealth: Payer: Self-pay | Admitting: *Deleted

## 2014-10-13 NOTE — Telephone Encounter (Signed)
Pt aware, wanted to FYI Dr. Bronson Ing she is no longer taking metoprolol per Dr. Luan Pulling BP was running 80s/50s now is 110s/70s since stopping metoprolol. Forwarded lab results to Dr. Luan Pulling. Updated medication list. Will forward to Dr. Daphane Shepherd

## 2014-10-13 NOTE — Telephone Encounter (Signed)
Message copied by Desma Mcgregor on Thu Oct 13, 2014 11:00 AM ------      Message from: Kate Sable A      Created: Thu Oct 13, 2014 10:30 AM       Good results. ------

## 2014-10-17 ENCOUNTER — Ambulatory Visit (INDEPENDENT_AMBULATORY_CARE_PROVIDER_SITE_OTHER): Payer: Medicare Other | Admitting: *Deleted

## 2014-10-17 DIAGNOSIS — I4891 Unspecified atrial fibrillation: Secondary | ICD-10-CM

## 2014-10-17 NOTE — Patient Instructions (Signed)
Pt was started on Xarelto for Atrial fib on 02/19/13 by Dr Lattie Haw .   Reviewed patients medication list. Pt is not currently on any combined P-gp and strong CYP3A4 inhibitors/inducers (ketoconazole, traconazole, ritonavir, carbamazepine, phenytoin, rifampin, St. John's wort). Reviewed labs from 10/12/14. SCr 0.76, Weight 173.8, CrCl 68.58. Dose appropriate based on CrCl. Hgb and HCT WNL: Hgb 13.0 Hct 39.6   A full discussion of the nature of anticoagulants has been carried out. A benefit/risk analysis has been presented to the patient, so that they understand the justification for choosing anticoagulation with Xarelto at this time. The need for compliance is stressed. Pt is aware to take the medication once daily with the largest meal of the day. Side effects of potential bleeding are discussed, including unusual colored urine or stools, coughing up blood or coffee ground emesis, nose bleeds or serious fall or head trauma. Discussed signs and symptoms of stroke. The patient should avoid any OTC items containing aspirin or ibuprofen. Avoid alcohol consumption. Call if any signs of abnormal bleeding. Discussed financial obligations and resolved any difficulty in obtaining medication. Next lab test test in 6 months.

## 2014-10-19 ENCOUNTER — Telehealth: Payer: Self-pay | Admitting: *Deleted

## 2014-10-19 NOTE — Telephone Encounter (Signed)
Pt is concerned about a fib. Says she can feel it from time to time. Wants to know if she needs to be seen before she is due to come back in 07/2015. Please advise.

## 2014-10-20 NOTE — Telephone Encounter (Signed)
Pt made aware. Is following up with Dr. Luan Pulling in 3 weeks for BP since being off of metoprolol. Pt understood

## 2014-10-20 NOTE — Telephone Encounter (Signed)
Perfectly normal to feel palpitations from time to time with her condition. If she starts becoming very short of breath, lightheaded, or dizzy with this, come to see me sooner. Otherwise August would be fine.

## 2014-10-25 LAB — HM DEXA SCAN

## 2014-11-08 ENCOUNTER — Telehealth: Payer: Self-pay | Admitting: *Deleted

## 2014-11-08 NOTE — Telephone Encounter (Signed)
Pt needs samples or xarelto 20 mg

## 2014-11-08 NOTE — Telephone Encounter (Signed)
Pt aware samples are available for pickup xarelto 20 mg 4 bottles.

## 2014-11-09 ENCOUNTER — Encounter: Payer: Self-pay | Admitting: Obstetrics and Gynecology

## 2014-11-15 ENCOUNTER — Encounter: Payer: Self-pay | Admitting: Pulmonary Disease

## 2014-12-23 HISTORY — PX: BREAST LUMPECTOMY: SHX2

## 2014-12-29 ENCOUNTER — Ambulatory Visit (INDEPENDENT_AMBULATORY_CARE_PROVIDER_SITE_OTHER): Payer: Medicare Other | Admitting: Otolaryngology

## 2014-12-29 DIAGNOSIS — K219 Gastro-esophageal reflux disease without esophagitis: Secondary | ICD-10-CM

## 2014-12-29 DIAGNOSIS — R490 Dysphonia: Secondary | ICD-10-CM

## 2015-02-01 ENCOUNTER — Other Ambulatory Visit: Payer: Self-pay | Admitting: *Deleted

## 2015-02-01 NOTE — Telephone Encounter (Signed)
4 boxes Xarelto 20 mg samples given to pt,see sign out book

## 2015-02-01 NOTE — Telephone Encounter (Signed)
Pt needs xarelto samples

## 2015-02-23 ENCOUNTER — Ambulatory Visit (INDEPENDENT_AMBULATORY_CARE_PROVIDER_SITE_OTHER): Payer: Medicare Other | Admitting: Otolaryngology

## 2015-02-23 ENCOUNTER — Other Ambulatory Visit (HOSPITAL_COMMUNITY): Payer: Self-pay | Admitting: Pulmonary Disease

## 2015-02-23 DIAGNOSIS — R2689 Other abnormalities of gait and mobility: Secondary | ICD-10-CM

## 2015-02-28 ENCOUNTER — Ambulatory Visit (HOSPITAL_COMMUNITY)
Admission: RE | Admit: 2015-02-28 | Discharge: 2015-02-28 | Disposition: A | Discharge status: Home / Self Care | Payer: Medicare Other | Source: Ambulatory Visit | Attending: Pulmonary Disease | Admitting: Pulmonary Disease

## 2015-02-28 DIAGNOSIS — R42 Dizziness and giddiness: Secondary | ICD-10-CM | POA: Diagnosis present

## 2015-02-28 DIAGNOSIS — R2689 Other abnormalities of gait and mobility: Secondary | ICD-10-CM

## 2015-02-28 MED ORDER — IOHEXOL 300 MG/ML  SOLN
75.0000 mL | Freq: Once | INTRAMUSCULAR | Status: AC | PRN
  Administered 2015-02-28: 75 mL via INTRAVENOUS

## 2015-03-29 ENCOUNTER — Ambulatory Visit (HOSPITAL_COMMUNITY): Payer: Medicare Other | Attending: Pulmonary Disease | Admitting: Physical Therapy

## 2015-03-29 DIAGNOSIS — R262 Difficulty in walking, not elsewhere classified: Secondary | ICD-10-CM | POA: Diagnosis present

## 2015-03-29 DIAGNOSIS — R29898 Other symptoms and signs involving the musculoskeletal system: Secondary | ICD-10-CM

## 2015-03-29 DIAGNOSIS — R2689 Other abnormalities of gait and mobility: Secondary | ICD-10-CM

## 2015-03-29 DIAGNOSIS — Z9181 History of falling: Secondary | ICD-10-CM | POA: Insufficient documentation

## 2015-03-29 NOTE — Patient Instructions (Signed)
Abduction: Clam (Eccentric) - Side-Lying   Lie on side with knees bent. With green Tband between knees Lift top knee, keeping feet together. Keep trunk steady. Slowly lower for 3-5 seconds. 10 to 20 reps per set, 3 sets per day, 7 days per week.  Copyright  VHI. All rights reserved.   Bridge   Lie back, legs bent. Inhale, pressing hips up. Keeping ribs in, lengthen lower back. Exhale, rolling down along spine from top. Repeat 10 to 20 times. Do 3 sessions per day.  Copyright  VHI. All rights reserved.

## 2015-03-29 NOTE — Therapy (Signed)
[place], [place], [address] Phone: [phone]   Fax:  [phone]  Physical Therapy Evaluation  Patient Details  Name: Hailey Graham MRN: [id number] Date of Birth: [birth year] Referring Provider:  Sinda Du, MD  Encounter Date: 03/29/2015      PT End of Session - 03/29/15 1643    Visit Number 1   Number of Visits 16   Date for PT Re-Evaluation 04/28/15   Authorization Type [employer] Medicare   Authorization - Visit Number 1   Authorization - Number of Visits 10   PT Start Time 1600   PT Stop Time 5638   PT Time Calculation (min) 45 min   Activity Tolerance Patient tolerated treatment well   Behavior During Therapy [facility] for tasks assessed/performed      Past Medical History  Diagnosis Date  . Paroxysmal atrial fibrillation     Rate related right bundle branch block; normal EF on echo in 2006; normal stress nuclear-2006; mild RV hypokinesis on MRI;  RA and RV enlargement on CT in 9/06; Anticoagulation->discontinued in 2009  . Gastroesophageal reflux disease   . Hyperlipidemia     Lipid profile in 05/2010:149, 86, 48, 84  . Glaucoma   . Diverticulosis   . Lung nodule     Right lower lobe; stable in 2010    Past Surgical History  Procedure Laterality Date  . Esophagogastroduodenoscopy    . Appendectomy    . Abdominal hysterectomy    . Hernia repair    . Orif ankle fracture  2003    Right    There were no vitals filed for this visit.  Visit Diagnosis:  Balance disorder - Plan: PT plan of care cert/re-cert  At high risk for falls - Plan: PT plan of care cert/re-cert  Weakness of both hips - Plan: PT plan of care cert/re-cert  Difficulty walking - Plan: PT plan of care cert/re-cert      Subjective Assessment - 03/29/15 1608    Subjective no pain except in Rt kneenotes fear of falling.    Pertinent History Patient tore a ligament in her Rt knee over 2 years ago and patient was hopeful of  healinmg but it still hurt sin front of her knee. Patient notes balance seems to be n related kne knee. Patient states inncreased steadiness on flat floor but noraes unsteadiness on uneven floors enve when the floor only has a small transition from the flat to carpet surface, No dizzines. Only notices balance difficulties during bwalking.    Patient Stated Goals to feel more confident in her balance while walking and to be sure of her footing.    Currently in Pain? Yes   Pain Score 0-No pain   Pain Location Knee   Pain Orientation Right   Pain Descriptors / Indicators Aching            [facility] PT Assessment - 03/29/15 0001    Assessment   Medical Diagnosis Increased risk of falls, balance disorder.    Onset Date 11/27/14   Next MD Visit Luan Pulling   Prior Therapy no   Balance Screen   Has the patient fallen in the past 6 months No   Has the patient had a decrease in activity level because of a fear of falling?  No   Is the patient reluctant to leave their home because of a fear of falling?  No   Prior Function   Level of Independence Independent with  basic ADLs   Observation/Other Assessments   Focus on Therapeutic Outcomes (FOTO)  40% limited   Other:   Other/ Comments 5x sit to stand <15seconds. noted knee pain Rt laterally.    Strength   Strength Assessment Site Knee;Ankle;Hip   Right/Left Hip Right;Left   Right Hip Flexion 4-/5   Right Hip ABduction 2+/5   Left Hip Flexion 4-/5   Left Hip ABduction 2+/5   Right/Left Knee Right;Left   Right Knee Flexion 4-/5   Right Knee Extension 4/5   Left Knee Flexion 4-/5   Left Knee Extension 4/5   Right/Left Ankle Right;Left   Right Ankle Dorsiflexion 4/5   Left Ankle Dorsiflexion 4/5   Berg Balance Test   Sit to Stand Able to stand without using hands and stabilize independently   Standing Unsupported Able to stand safely 2 minutes   Sitting with Back Unsupported but Feet Supported on Floor or Stool Able to sit safely and securely  2 minutes   Stand to Sit Sits safely with minimal use of hands   Transfers Able to transfer safely, minor use of hands   Standing Unsupported with Eyes Closed Able to stand 10 seconds safely   Standing Ubsupported with Feet Together Able to place feet together independently and stand 1 minute safely   From Standing, Reach Forward with Outstretched Arm Can reach forward >12 cm safely (5")   From Standing Position, Pick up Object from Floor Able to pick up shoe, needs supervision   From Standing Position, Turn to Look Behind Over each Shoulder Turn sideways only but maintains balance   Turn 360 Degrees Able to turn 360 degrees safely but slowly   Standing Unsupported, Alternately Place Feet on Step/Stool Able to stand independently and complete 8 steps >20 seconds   Standing Unsupported, One Foot in Front Able to take small step independently and hold 30 seconds   Standing on One Leg Tries to lift leg/unable to hold 3 seconds but remains standing independently   Total Score 44   Dynamic Gait Index   Level Surface Normal   Change in Gait Speed Normal   Gait with Horizontal Head Turns Mild Impairment   Gait with Vertical Head Turns Mild Impairment   Gait and Pivot Turn Normal   Step Over Obstacle Moderate Impairment   Step Around Obstacles Mild Impairment   Steps Severe Impairment   Total Score 16                   [facility] Adult PT Treatment/Exercise - 03/29/15 0001    Knee/Hip Exercises: Supine   Bridges 10 reps;AROM   Knee/Hip Exercises: Sidelying   Clams 10x with green Tband                   PT Short Term Goals - 03/29/15 1936    PT SHORT TERM GOAL #1   Title Patint will be able to demosntrate increased hip abduction strength of 3+/5 MMT to decrease frequency and severity of trendelenberg sign during gait.    Baseline 2+/5   Time 4   Period Weeks   Status New   PT SHORT TERM GOAL #2   Title Patient will dmeonstrate increased single leg balance to >3 seconds  bilaterally   Baseline <3 seconds   Time 4   Period Weeks   Status New   PT SHORT TERM GOAL #3   Title Patient will dmeonstrate increased ability to ambualte up and down steps with only 1  HHA to ambualte up and down stairs while carrying groceries.    Baseline bilateral HHA   Time 4   Period Weeks   Status New   PT SHORT TERM GOAL #4   Title I with HEP   Time 4   Period Weeks   Status New           PT Long Term Goals - 2015-04-13 1938    PT LONG TERM GOAL #1   Title Patint will be able to demosntrate increased hip abduction strength of 4//5 MMT to decrease frequency and severity of trendelenberg sign during gait.    Time 8   Period Weeks   Status New   PT LONG TERM GOAL #2   Title Patient will demonstrate increased single leg balance to >10 seconds bilaterally   Time 8   Period Weeks   Status New   PT LONG TERM GOAL #3   Title Patient will dmeosntrate improved DGI score to >19 indicating patient not at high fall risk while walking   Time 8   Period Weeks   Status New   PT LONG TERM GOAL #4   Title Patient to demosntrate improved BERG balance score > 52 indicatign patient not at high falls risk.    Time 8   Period Weeks   Status New   PT LONG TERM GOAL #5   Title patient wil be able to ambualte up and down stairs withtou UE support.    Time 8   Period Weeks   Status New               Plan - 04/13/15 1928    Clinical Impression Statement Patient displays decreased balance and increased risk of falls secondary to severe hip abduction bilateral weakness resulting in elevated risk of falls as indicated by poor BERG and DGI scores. Patient will benefit from skileld physical therapy with a focus on increasing bilateral hip abduction strength so patient can ambualte without bilateral trendelenberg gait and decrease risk of falls.    Pt will benefit from skilled therapeutic intervention in order to improve on the following deficits Abnormal gait;Decreased  endurance;Improper body mechanics;Decreased strength;Decreased activity tolerance;Difficulty walking;Decreased balance   Rehab Potential Good   PT Frequency 2x / week   PT Duration 8 weeks   PT Treatment/Interventions Gait training;Neuromuscular re-education;Stair training;Functional mobility training;Patient/family education;Manual techniques;Therapeutic exercise;Balance training   PT Next Visit Plan introduce piriformis stretch, and gastroc stretch, 3D hip excursions single leg standing with hip abduction, anterior lunges and sumo walking   PT Home Exercise Plan bridges and sidelaying clams with green T-band.    Consulted and Agree with Plan of Care Patient          G-Codes - 2015/04/13 1643    Functional Assessment Tool Used FOTO 40% limited   Functional Limitation Mobility: Walking and moving around   Mobility: Walking and Moving Around Current Status [id number]) At least 40 percent but less than 60 percent impaired, limited or restricted   Mobility: Walking and Moving Around Goal Status [id number]) At least 20 percent but less than 40 percent impaired, limited or restricted       Problem List Patient Active Problem List   Diagnosis Date Noted  . Hx of diagnostic tests 03/22/2013  . Chronic anticoagulation 02/19/2013  . Paroxysmal atrial fibrillation   . Gastroesophageal reflux disease   . Hyperlipidemia   . Varicose veins of lower extremities with other complications 58/30/9407   Devona Konig PT DPT [phone]  [place], [place], [address] Phone: [phone]   Fax:  [phone]

## 2015-03-30 ENCOUNTER — Ambulatory Visit (HOSPITAL_COMMUNITY): Payer: Medicare Other | Admitting: Physical Therapy

## 2015-03-30 DIAGNOSIS — Z9181 History of falling: Secondary | ICD-10-CM

## 2015-03-30 DIAGNOSIS — R2689 Other abnormalities of gait and mobility: Secondary | ICD-10-CM

## 2015-03-30 DIAGNOSIS — R262 Difficulty in walking, not elsewhere classified: Secondary | ICD-10-CM | POA: Diagnosis not present

## 2015-03-30 DIAGNOSIS — R29898 Other symptoms and signs involving the musculoskeletal system: Secondary | ICD-10-CM

## 2015-03-30 NOTE — Patient Instructions (Signed)
Heel Raise: Bilateral (Standing)   Rise on balls of feet. Repeat __10__ times per set. Do _1___ sets per session. Do _2___ sessions per day.  http://orth.exer.us/38   Copyright  VHI. All rights reserved.  Toe Raise (Standing)   Rock back on heels. Repeat _10___ times per set. Do ___1_ sets per session. Do __2__ sessions per day.  http://orth.exer.us/42   Copyright  VHI. All rights reserved.

## 2015-03-30 NOTE — Therapy (Signed)
[place], [place], [address] Phone: [phone]   Fax:  [phone]  Physical Therapy Treatment  Patient Details  Name: Hailey Graham MRN: [id number] Date of Birth: 1928/11/04 Referring Provider:  Sinda Du, MD  Encounter Date: 03/30/2015      PT End of Session - 03/30/15 1344    Visit Number 2   Number of Visits 16   Date for PT Re-Evaluation 04/28/15   Authorization Type BCBS Medicare   Authorization - Visit Number 2   Authorization - Number of Visits 10   PT Start Time 1300   PT Stop Time 9449   PT Time Calculation (min) 47 min      Past Medical History  Diagnosis Date  . Paroxysmal atrial fibrillation     Rate related right bundle branch block; normal EF on echo in 2006; normal stress nuclear-2006; mild RV hypokinesis on MRI;  RA and RV enlargement on CT in 9/06; Anticoagulation->discontinued in 2009  . Gastroesophageal reflux disease   . Hyperlipidemia     Lipid profile in 05/2010:149, 86, 48, 84  . Glaucoma   . Diverticulosis   . Lung nodule     Right lower lobe; stable in 2010    Past Surgical History  Procedure Laterality Date  . Esophagogastroduodenoscopy    . Appendectomy    . Abdominal hysterectomy    . Hernia repair    . Orif ankle fracture  2003    Right    There were no vitals filed for this visit.  Visit Diagnosis:  Balance disorder  At high risk for falls  Weakness of both hips  Difficulty walking      Subjective Assessment - 03/30/15 1259    Subjective Pt states she had no difficulty with the HEP that was given to her.    Currently in Pain? Yes   Pain Score 5    Pain Location Knee   Pain Orientation Right                            Balance Exercises - 03/30/15 1314    Balance Exercises: Standing    --  lunging onto 8" step x 10 B   SLS Eyes open;5 reps x2    --     Rockerboard Lateral;Other time (comment)  two minute    Heel  Raises Limitations x10   Toe Raise Limitations x10   Sit to Stand Time x10   Other Standing Exercises 3 D hip excursion x 5; sumo walking x 2 Rt ; slant board stretch x 30 [place] x 2    Balance Exercises: Seated   Other Seated Exercises sit to stand Lt back x 10 ; Rt back x 3 due to pain; Piriformis stretch     Balance Exercises: Supine   Other Supine Exercises prone knee flex 4# x 10 ; hip extension x 10 both LE            PT Education - 03/30/15 1342    Education provided Yes   Education Details heel raise/toe raise    Person(s) Educated Patient   Methods Explanation;Handout   Comprehension Returned demonstration;Verbalized understanding          PT Short Term Goals - 03/29/15 1936    PT SHORT TERM GOAL #1   Title Patint will be able to demosntrate increased hip abduction strength of 3+/5 MMT to decrease frequency and  severity of trendelenberg sign during gait.    Baseline 2+/5   Time 4   Period Weeks   Status New   PT SHORT TERM GOAL #2   Title Patient will dmeonstrate increased single leg balance to >3 seconds bilaterally   Baseline <3 seconds   Time 4   Period Weeks   Status New   PT SHORT TERM GOAL #3   Title Patient will dmeonstrate increased ability to ambualte up and down steps with only 1 HHA to ambualte up and down stairs while carrying groceries.    Baseline bilateral HHA   Time 4   Period Weeks   Status New   PT SHORT TERM GOAL #4   Title I with HEP   Time 4   Period Weeks   Status New           PT Long Term Goals - Apr 24, 2015 1938    PT LONG TERM GOAL #1   Title Patint will be able to demosntrate increased hip abduction strength of 4//5 MMT to decrease frequency and severity of trendelenberg sign during gait.    Time 8   Period Weeks   Status New   PT LONG TERM GOAL #2   Title Patient will demonstrate increased single leg balance to >10 seconds bilaterally   Time 8   Period Weeks   Status New   PT LONG TERM GOAL #3   Title Patient will  dmeosntrate improved DGI score to >19 indicating patient not at high fall risk while walking   Time 8   Period Weeks   Status New   PT LONG TERM GOAL #4   Title Patient to demosntrate improved BERG balance score > 52 indicatign patient not at high falls risk.    Time 8   Period Weeks   Status New   PT LONG TERM GOAL #5   Title patient wil be able to ambualte up and down stairs withtou UE support.    Time 8   Period Weeks   Status New               Plan - 03/30/15 1345    Clinical Impression Statement Added balance and strengthening exercises to pt regieme.  Pt able to compltete all tasks except sit to stand with Rt LE back with minimal complaint.  Sit to stand with Rt leg back caused increaed pain along lateral hamstring tendon.  Needed to modify SLS with hip abduction to SLS only .      PT Next Visit Plan Begin step ups and tandem and side step gt on balance beam           G-Codes - 24-Apr-2015 1643    Functional Assessment Tool Used FOTO 40% limited   Functional Limitation Mobility: Walking and moving around   Mobility: Walking and Moving Around Current Status [phone]) At least 40 percent but less than 60 percent impaired, limited or restricted   Mobility: Walking and Moving Around Goal Status [phone]) At least 20 percent but less than 40 percent impaired, limited or restricted      Problem List Patient Active Problem List   Diagnosis Date Noted  . Hx of diagnostic tests 03/22/2013  . Chronic anticoagulation 02/19/2013  . Paroxysmal atrial fibrillation   . Gastroesophageal reflux disease   . Hyperlipidemia   . Varicose veins of lower extremities with other complications 11/94/1740   Rayetta Humphrey, PT CLT [phone] 03/30/2015, 1:49 PM  [place] [place]  [address] [place], [place], [address] Phone: [phone]   Fax:  [phone]

## 2015-04-05 ENCOUNTER — Ambulatory Visit (HOSPITAL_COMMUNITY): Payer: Medicare Other | Admitting: Physical Therapy

## 2015-04-05 DIAGNOSIS — R262 Difficulty in walking, not elsewhere classified: Secondary | ICD-10-CM

## 2015-04-05 DIAGNOSIS — R2689 Other abnormalities of gait and mobility: Secondary | ICD-10-CM

## 2015-04-05 DIAGNOSIS — R29898 Other symptoms and signs involving the musculoskeletal system: Secondary | ICD-10-CM

## 2015-04-05 DIAGNOSIS — Z9181 History of falling: Secondary | ICD-10-CM

## 2015-04-05 NOTE — Therapy (Signed)
[place], [place], [address] Phone: [phone]   Fax:  [phone]  Physical Therapy Treatment  Patient Details  Name: Hailey Graham MRN: [id number] Date of Birth: 07/31/1928 Referring Provider:  Sinda Du, MD  Encounter Date: 04/05/2015      PT End of Session - 04/05/15 1449    Visit Number 3   Number of Visits 26   Authorization - Visit Number 3   Authorization - Number of Visits 11   PT Start Time 1350   PT Stop Time 8250   PT Time Calculation (min) 52 min      Past Medical History  Diagnosis Date  . Paroxysmal atrial fibrillation     Rate related right bundle branch block; normal EF on echo in 2006; normal stress nuclear-2006; mild RV hypokinesis on MRI;  RA and RV enlargement on CT in 9/06; Anticoagulation->discontinued in 2009  . Gastroesophageal reflux disease   . Hyperlipidemia     Lipid profile in 05/2010:149, 86, 48, 84  . Glaucoma   . Diverticulosis   . Lung nodule     Right lower lobe; stable in 2010    Past Surgical History  Procedure Laterality Date  . Esophagogastroduodenoscopy    . Appendectomy    . Abdominal hysterectomy    . Hernia repair    . Orif ankle fracture  2003    Right    There were no vitals filed for this visit.  Visit Diagnosis:  Balance disorder  At high risk for falls  Weakness of both hips  Difficulty walking      Subjective Assessment - 04/05/15 1437    Subjective Pt states that her Rt knee has been bothering her.  She states today her knee almost gave way with her.  Rt knee has a ligament that has been torn for years. Pt states pain will come and go but for some reason her knee has been really bothering her today.                        [facility] Adult PT Treatment/Exercise - 04/05/15 0001    Exercises   Exercises Knee/Hip   Knee/Hip Exercises: Supine   Short Arc Quad Sets Both;15 reps   Short Arc Quad Sets Limitations 5#    Knee/Hip Exercises: Sidelying   Hip ABduction Strengthening;Both;10 reps   Knee/Hip Exercises: Prone   Hamstring Curl 10 reps   Hamstring Curl Limitations both 3# then 5#   Other Prone Exercises IR/ER of hip Both using 3/5 # x 10    Manual Therapy   Manual Therapy Massage   Massage posterior knee with lavender oil                 PT Education - 04/05/15 1448    Education provided Yes   Education Details new HEP for hip abduction, SAQ; ham curls and hip IR/ER while prone   Person(s) Educated Patient   Methods Explanation;Handout   Comprehension Verbalized understanding;Returned demonstration          PT Short Term Goals - 04/05/15 1458    PT SHORT TERM GOAL #1   Title Patint will be able to demosntrate increased hip abduction strength of 3+/5 MMT to decrease frequency and severity of trendelenberg sign during gait.    Baseline 2+/5   Time 4   Status On-going   PT SHORT TERM GOAL #2   Title Patient will dmeonstrate  increased single leg balance to >3 seconds bilaterally   Time 4   Period Weeks   Status On-going   PT SHORT TERM GOAL #3   Title Patient will dmeonstrate increased ability to ambualte up and down steps with only 1 HHA to ambualte up and down stairs while carrying groceries.    Baseline bilateral HHA   Time 4   Period Weeks   Status On-going   PT SHORT TERM GOAL #4   Title I with HEP   Time 4   Status On-going           PT Long Term Goals - 04/05/15 1459    PT LONG TERM GOAL #1   Title Patint will be able to demosntrate increased hip abduction strength of 4//5 MMT to decrease frequency and severity of trendelenberg sign during gait.    Time 8   Period Weeks   Status On-going   PT LONG TERM GOAL #2   Title Patient will demonstrate increased single leg balance to >10 seconds bilaterally   Time 8   Period Weeks   Status On-going   PT LONG TERM GOAL #3   Title Patient will dmeosntrate improved DGI score to >19 indicating patient not at high  fall risk while walking   Time 8   Period Weeks   Status On-going   PT LONG TERM GOAL #4   Title Patient to demosntrate improved BERG balance score > 52 indicatign patient not at high falls risk.    Time 8   Period Weeks   Status On-going   PT LONG TERM GOAL #5   Title patient wil be able to ambualte up and down stairs withtou UE support.    Time 8   Period Weeks   Status On-going               Plan - 04/05/15 1450    Clinical Impression Statement Pt had increased knee pain today (8/10) therefore pt limited weightbearing activity added manual into treatment regieme.  Pt was pain free when she left clinic   PT Next Visit Plan  Continue with weight bearing activity if pt pain level allows.  Begin step ups and tandem and side step gt on balance beam         Problem List Patient Active Problem List   Diagnosis Date Noted  . Hx of diagnostic tests 03/22/2013  . Chronic anticoagulation 02/19/2013  . Paroxysmal atrial fibrillation   . Gastroesophageal reflux disease   . Hyperlipidemia   . Varicose veins of lower extremities with other complications 28/20/6015   Rayetta Humphrey, PT CLT [phone] 04/05/2015, 2:59 PM  [place], [place], [address] Phone: [phone]   Fax:  [phone]

## 2015-04-05 NOTE — Patient Instructions (Signed)
Strengthening: Terminal Knee Extension (Supine)   With right knee over bolster, straighten knee by tightening muscles on top of thigh. Keep bottom of knee on bolster. Use 5# weight Repeat 10-15 ___ times per set. Do __1__ sets per session. Do ____2 sessions per day.  http://orth.exer.us/626   Copyright  VHI. All rights reserved.  Self-Mobilization: Knee Flexion (Prone)   Bring left heel toward buttocks as close as possible. Hold _3___ seconds. Relax. Repeat _10-15___ times per set. Do __1__ sets per session. Do ___3_ sessions per day.  http://orth.exer.us/596   Copyright  VHI. All rights reserved.  Strengthening: Hip Abduction (Side-Lying)   Tighten muscles on front of left thigh, then lift leg __18__ inches from surface, keeping knee locked.  Repeat __15__ times per set. Do _1___ sets per session. Do __2__ sessions per day.  http://orth.exer.us/622   Copyright  VHI. All rights reserved.  Internal Hip Rotation (Prone)   Abdomen supported, bend left knee and slowly lower leg out until other hip starts to rise. Keep stomach tight. Now go in until your hip starts to rise.  Repeat __10__ times per set. Do 1____ sets per session. Do __2__ sessions per day.  http://orth.exer.us/1082   Copyright  VHI. All rights reserved.

## 2015-04-06 ENCOUNTER — Ambulatory Visit (HOSPITAL_COMMUNITY): Payer: Medicare Other | Admitting: Physical Therapy

## 2015-04-06 ENCOUNTER — Other Ambulatory Visit (HOSPITAL_COMMUNITY): Payer: Self-pay | Admitting: Pulmonary Disease

## 2015-04-06 DIAGNOSIS — M25561 Pain in right knee: Secondary | ICD-10-CM

## 2015-04-07 ENCOUNTER — Ambulatory Visit (HOSPITAL_COMMUNITY)
Admission: RE | Admit: 2015-04-07 | Discharge: 2015-04-07 | Disposition: A | Discharge status: Home / Self Care | Payer: Medicare Other | Source: Ambulatory Visit | Attending: Pulmonary Disease | Admitting: Pulmonary Disease

## 2015-04-07 DIAGNOSIS — M25561 Pain in right knee: Secondary | ICD-10-CM | POA: Diagnosis present

## 2015-04-12 ENCOUNTER — Encounter (INDEPENDENT_AMBULATORY_CARE_PROVIDER_SITE_OTHER): Payer: Self-pay | Admitting: *Deleted

## 2015-04-12 ENCOUNTER — Ambulatory Visit (HOSPITAL_COMMUNITY): Payer: Medicare Other | Admitting: Physical Therapy

## 2015-04-12 DIAGNOSIS — R29898 Other symptoms and signs involving the musculoskeletal system: Secondary | ICD-10-CM

## 2015-04-12 DIAGNOSIS — R262 Difficulty in walking, not elsewhere classified: Secondary | ICD-10-CM | POA: Diagnosis not present

## 2015-04-12 DIAGNOSIS — R2689 Other abnormalities of gait and mobility: Secondary | ICD-10-CM

## 2015-04-12 DIAGNOSIS — Z9181 History of falling: Secondary | ICD-10-CM

## 2015-04-12 NOTE — Therapy (Signed)
[place], [place], [address] Phone: [phone]   Fax:  [phone]  Physical Therapy Treatment  Patient Details  Name: Hailey Graham MRN: [id number] Date of Birth: 19-Dec-1928 Referring Provider:  Sinda Du, MD  Encounter Date: 04/12/2015      PT End of Session - 04/12/15 1342    Visit Number 4   Number of Visits 26   Date for PT Re-Evaluation 04/28/15   Authorization Type BCBS Medicare   Authorization - Visit Number 4   Authorization - Number of Visits 10   PT Start Time 1751   PT Stop Time 1348   PT Time Calculation (min) 46 min      Past Medical History  Diagnosis Date  . Paroxysmal atrial fibrillation     Rate related right bundle branch block; normal EF on echo in 2006; normal stress nuclear-2006; mild RV hypokinesis on MRI;  RA and RV enlargement on CT in 9/06; Anticoagulation->discontinued in 2009  . Gastroesophageal reflux disease   . Hyperlipidemia     Lipid profile in 05/2010:149, 86, 48, 84  . Glaucoma   . Diverticulosis   . Lung nodule     Right lower lobe; stable in 2010    Past Surgical History  Procedure Laterality Date  . Esophagogastroduodenoscopy    . Appendectomy    . Abdominal hysterectomy    . Hernia repair    . Orif ankle fracture  2003    Right    There were no vitals filed for this visit.  Visit Diagnosis:  Balance disorder  At high risk for falls  Weakness of both hips  Difficulty walking      Subjective Assessment - 04/12/15 1304    Currently in Pain? Yes   Pain Score 5    Pain Location Knee   Pain Orientation Right             [facility] Adult PT Treatment/Exercise - 04/12/15 0001    Knee/Hip Exercises: Stretches   Piriformis Stretch 3 reps;30 seconds   Piriformis Stretch Limitations chair   Knee/Hip Exercises: Aerobic   Stationary Bike nustep hills 3; Level 3 x 10:00   Knee/Hip Exercises: Standing   Heel Raises 10 reps   Forward Step Up  Both;10 reps   Forward Step Up Limitations Rt 2" step; Lt 4" step    Functional Squat 10 reps   SLS x3  B   Other Standing Knee Exercises retro gt; tandem and sidestep on foam all x 2  RT   Knee/Hip Exercises: Sidelying   Hip ABduction Strengthening;10 reps   Hip ABduction Limitations 3#   Knee/Hip Exercises: Prone   Hamstring Curl 10 reps   Hamstring Curl Limitations both 5#   Hip Extension Strengthening;Both;10 reps                  PT Short Term Goals - 04/05/15 1458    PT SHORT TERM GOAL #1   Title Patint will be able to demosntrate increased hip abduction strength of 3+/5 MMT to decrease frequency and severity of trendelenberg sign during gait.    Baseline 2+/5   Time 4   Status On-going   PT SHORT TERM GOAL #2   Title Patient will dmeonstrate increased single leg balance to >3 seconds bilaterally   Time 4   Period Weeks   Status On-going   PT SHORT TERM GOAL #3   Title Patient will dmeonstrate increased ability to [employer]  up and down steps with only 1 HHA to ambualte up and down stairs while carrying groceries.    Baseline bilateral HHA   Time 4   Period Weeks   Status On-going   PT SHORT TERM GOAL #4   Title I with HEP   Time 4   Status On-going           PT Long Term Goals - 04/05/15 1459    PT LONG TERM GOAL #1   Title Patint will be able to demosntrate increased hip abduction strength of 4//5 MMT to decrease frequency and severity of trendelenberg sign during gait.    Time 8   Period Weeks   Status On-going   PT LONG TERM GOAL #2   Title Patient will demonstrate increased single leg balance to >10 seconds bilaterally   Time 8   Period Weeks   Status On-going   PT LONG TERM GOAL #3   Title Patient will dmeosntrate improved DGI score to >19 indicating patient not at high fall risk while walking   Time 8   Period Weeks   Status On-going   PT LONG TERM GOAL #4   Title Patient to demosntrate improved BERG balance score > 52 indicatign patient  not at high falls risk.    Time 8   Period Weeks   Status On-going   PT LONG TERM GOAL #5   Title patient wil be able to ambualte up and down stairs withtou UE support.    Time 8   Period Weeks   Status On-going               Plan - 04/12/15 1342    Clinical Impression Statement Added balance activity.  Pt has difficuty with SLS but improves with repetition.  Pt continues to have chronic Rt knee pain states she has an appointment with the orthopedic MD.  Able to add weight to open chained exercises with noted weakness in B gluteal mm.   PT Next Visit Plan begin rockerboard as well as sit to stand next session.        Problem List Patient Active Problem List   Diagnosis Date Noted  . Hx of diagnostic tests 03/22/2013  . Chronic anticoagulation 02/19/2013  . Paroxysmal atrial fibrillation   . Gastroesophageal reflux disease   . Hyperlipidemia   . Varicose veins of lower extremities with other complications 74/16/3845   Rayetta Humphrey, PT CLT [phone] 04/12/2015, 1:45 PM  [place], [place], [address] Phone: [phone]   Fax:  [phone]

## 2015-04-18 ENCOUNTER — Ambulatory Visit (HOSPITAL_COMMUNITY): Payer: Medicare Other

## 2015-04-18 DIAGNOSIS — Z9181 History of falling: Secondary | ICD-10-CM

## 2015-04-18 DIAGNOSIS — R2689 Other abnormalities of gait and mobility: Secondary | ICD-10-CM

## 2015-04-18 DIAGNOSIS — R262 Difficulty in walking, not elsewhere classified: Secondary | ICD-10-CM | POA: Diagnosis not present

## 2015-04-18 DIAGNOSIS — R29898 Other symptoms and signs involving the musculoskeletal system: Secondary | ICD-10-CM

## 2015-04-18 NOTE — Therapy (Signed)
[place], [place], [address] Phone: [phone]   Fax:  [phone]  Physical Therapy Treatment  Patient Details  Name: Hailey Graham MRN: [id number] Date of Birth: 1928-08-13 Referring Provider:  Sinda Du, MD  Encounter Date: 04/18/2015      PT End of Session - 04/18/15 1617    Visit Number 5   Number of Visits 26   Date for PT Re-Evaluation 04/28/15   Authorization Type BCBS Medicare   Authorization - Visit Number 4   Authorization - Number of Visits 10   PT Start Time 1600   PT Stop Time 2725   PT Time Calculation (min) 54 min   Equipment Utilized During Treatment Gait belt   Activity Tolerance Patient tolerated treatment well   Behavior During Therapy [facility] for tasks assessed/performed      Past Medical History  Diagnosis Date  . Paroxysmal atrial fibrillation     Rate related right bundle branch block; normal EF on echo in 2006; normal stress nuclear-2006; mild RV hypokinesis on MRI;  RA and RV enlargement on CT in 9/06; Anticoagulation->discontinued in 2009  . Gastroesophageal reflux disease   . Hyperlipidemia     Lipid profile in 05/2010:149, 86, 48, 84  . Glaucoma   . Diverticulosis   . Lung nodule     Right lower lobe; stable in 2010    Past Surgical History  Procedure Laterality Date  . Esophagogastroduodenoscopy    . Appendectomy    . Abdominal hysterectomy    . Hernia repair    . Orif ankle fracture  2003    Right    There were no vitals filed for this visit.  Visit Diagnosis:  Balance disorder  At high risk for falls  Weakness of both hips  Difficulty walking      Subjective Assessment - 04/18/15 1603    Subjective Pt stated she is limited by pain in torn ligament in Rt knee today pain scale 8/10   Currently in Pain? Yes   Pain Location Knee   Pain Orientation Right            [facility] PT Assessment - 04/18/15 0001    Assessment   Medical Diagnosis Increased  risk of falls, balance disorder.    Onset Date 11/27/14   Next MD Visit Luan Pulling unscheduled: MD [facility] 05/03/2015   Prior Therapy no             [facility] Adult PT Treatment/Exercise - 04/18/15 0001    Exercises   Exercises Knee/Hip   Knee/Hip Exercises: Standing   Functional Squat 10 reps   Rocker Board 2 minutes   Rocker Board Limitations R/L   Knee/Hip Exercises: Seated   Other Seated Knee Exercises 5 Sit to stands no HHA   Knee/Hip Exercises: Supine   Short Arc Quad Sets Both;15 reps   Short Arc Quad Sets Limitations 5#   Bridges AROM;15 reps   Knee/Hip Exercises: Sidelying   Hip ABduction Both;Strengthening;15 reps   Hip ABduction Limitations 0# per unable to lift full range   Knee/Hip Exercises: Prone   Hamstring Curl 10 reps   Hamstring Curl Limitations both 5#   Hip Extension AAROM;Strengthening;10 reps   Other Prone Exercises IR/ER of hip Both using 5 # x 10             PT Short Term Goals - 04/18/15 1700    PT SHORT TERM GOAL #1   Title  Patint will be able to demosntrate increased hip abduction strength of 3+/5 MMT to decrease frequency and severity of trendelenberg sign during gait.    Status On-going   PT SHORT TERM GOAL #2   Title Patient will dmeonstrate increased single leg balance to >3 seconds bilaterally   PT SHORT TERM GOAL #3   Title Patient will dmeonstrate increased ability to ambualte up and down steps with only 1 HHA to ambualte up and down stairs while carrying groceries.    PT SHORT TERM GOAL #4   Title I with HEP           PT Long Term Goals - 04/18/15 1700    PT LONG TERM GOAL #1   Title Patint will be able to demosntrate increased hip abduction strength of 4//5 MMT to decrease frequency and severity of trendelenberg sign during gait.    Status On-going   PT LONG TERM GOAL #2   Title Patient will demonstrate increased single leg balance to >10 seconds bilaterally   PT LONG TERM GOAL #3   Title Patient will  dmeosntrate improved DGI score to >19 indicating patient not at high fall risk while walking   PT LONG TERM GOAL #4   Title Patient to demosntrate improved BERG balance score > 52 indicatign patient not at high falls risk.    PT LONG TERM GOAL #5   Title patient wil be able to ambualte up and down stairs withtou UE support.                Plan - 04/18/15 1618    Clinical Impression Statement Added rockerboard to improve weight loading with gait.  Pt limited by Rt knee pain, requested to reduce weight bearing activities this session. Session focus on functional LE strengthening with weight added/removed as able for full range.  Noted weak gluteal max and medius musculature, AAROM required with prone SLR for full extension.  No reports of increased knee pain through session.  Pt encouraged to increase frequency with HEP for ultimate strengthening benefits, reviwed exercises to assure correct form.   PT Next Visit Plan Resume standing exercises as tolerable per Rt knee.  Focus on gluteal strengtening and balance activities.        Problem List Patient Active Problem List   Diagnosis Date Noted  . Hx of diagnostic tests 03/22/2013  . Chronic anticoagulation 02/19/2013  . Paroxysmal atrial fibrillation   . Gastroesophageal reflux disease   . Hyperlipidemia   . Varicose veins of lower extremities with other complications 18/84/1660   Ihor Austin, [place]; [place] #[id number] [phone]  Aldona Lento 04/18/2015, 5:08 PM  [place], [place], [address] Phone: [phone]   Fax:  [phone]

## 2015-04-20 ENCOUNTER — Telehealth (HOSPITAL_COMMUNITY): Payer: Self-pay | Admitting: Specialist

## 2015-04-20 NOTE — Telephone Encounter (Signed)
Mrs. Holstine stopped in the clinic today to inquire about just riding the bike.  She feels the bike is making her knee feel better, and that all other therapy interventions are intensifying her pain.  While patient is not able to ride bike during non therapy times, I did encourage her to attend her 04/25/15 visit with Leanord Asal DeWitt and to discuss her pain intensification at that time so that adjustments to her treatment plan can be made.  I also offered her a free 2 week membership to the [facility] so that she could use the bike at that location.  She stated that she would think about it and decide after discussing with Cash on 04/25/15. Hazeline Junker, OTR/L

## 2015-04-21 ENCOUNTER — Encounter (HOSPITAL_COMMUNITY): Payer: Medicare Other

## 2015-04-25 ENCOUNTER — Encounter (HOSPITAL_COMMUNITY): Payer: Medicare Other | Admitting: Physical Therapy

## 2015-04-28 ENCOUNTER — Telehealth (HOSPITAL_COMMUNITY): Payer: Self-pay | Admitting: Physical Therapy

## 2015-04-28 ENCOUNTER — Ambulatory Visit (HOSPITAL_COMMUNITY): Payer: Medicare Other | Admitting: Physical Therapy

## 2015-04-28 NOTE — Telephone Encounter (Signed)
She can not come today she wll come next week 05/02/15. Requested Cash b/c she wants to talk to him about her progress/and going to the  [facility]

## 2015-05-02 ENCOUNTER — Ambulatory Visit (HOSPITAL_COMMUNITY): Payer: Medicare Other | Attending: Pulmonary Disease | Admitting: Physical Therapy

## 2015-05-02 DIAGNOSIS — Z9181 History of falling: Secondary | ICD-10-CM | POA: Insufficient documentation

## 2015-05-02 DIAGNOSIS — R29898 Other symptoms and signs involving the musculoskeletal system: Secondary | ICD-10-CM

## 2015-05-02 DIAGNOSIS — R2689 Other abnormalities of gait and mobility: Secondary | ICD-10-CM

## 2015-05-02 DIAGNOSIS — R262 Difficulty in walking, not elsewhere classified: Secondary | ICD-10-CM

## 2015-05-02 NOTE — Therapy (Signed)
[place], [place], [address] Phone: [phone]   Fax:  [phone]  Physical Therapy Treatment and Reassessment  Patient Details  Name: Hailey Graham MRN: [id number] Date of Birth: 09-19-1928 Referring Provider:  Sinda Du, MD  Encounter Date: 05/02/2015      PT End of Session - 05/02/15 1635    Visit Number 6   Number of Visits 26   Date for PT Re-Evaluation 06/01/15   Authorization Type [employer] Medicare   Authorization - Visit Number 5   Authorization - Number of Visits 10   PT Start Time 1600   PT Stop Time 1645   PT Time Calculation (min) 45 min   Equipment Utilized During Treatment Gait belt   Activity Tolerance Patient tolerated treatment well   Behavior During Therapy [name] for tasks assessed/performed      Past Medical History  Diagnosis Date  . Paroxysmal atrial fibrillation     Rate related right bundle branch block; normal EF on echo in 2006; normal stress nuclear-2006; mild RV hypokinesis on MRI;  RA and RV enlargement on CT in 9/06; Anticoagulation->discontinued in 2009  . Gastroesophageal reflux disease   . Hyperlipidemia     Lipid profile in 05/2010:149, 86, 48, 84  . Glaucoma   . Diverticulosis   . Lung nodule     Right lower lobe; stable in 2010    Past Surgical History  Procedure Laterality Date  . Esophagogastroduodenoscopy    . Appendectomy    . Abdominal hysterectomy    . Hernia repair    . Orif ankle fracture  2003    Right    There were no vitals filed for this visit.  Visit Diagnosis:  Balance disorder  At high risk for falls  Weakness of both hips  Difficulty walking      Subjective Assessment - 05/02/15 1607    Subjective Patient states that she feels her decreased balance is attributed to her medication. Patient notes limited improvement in balance. oting her balance fluctuates throughout the day.             [facility] PT Assessment - 05/02/15 0001    Assessment   Medical Diagnosis Increased risk of falls, balance disorder.    Onset Date 11/27/14   Next MD Visit Luan Pulling unscheduled: MD [facility] 05/03/2015   Prior Therapy no   Other:   Other/ Comments 5x sit to stand <15seconds. noted knee pain Rt laterally.    Strength   Right Hip Flexion 4/5   Right Hip Extension 2+/5   Right Hip ABduction 3-/5   Left Hip Flexion 4/5   Left Hip Extension 3+/5   Left Hip ABduction 3-/5   Right Knee Flexion 4-/5   Right Knee Extension 4+/5   Left Knee Flexion 4/5   Left Knee Extension 4+/5   Right Ankle Dorsiflexion 4+/5   Left Ankle Dorsiflexion 4+/5   Berg Balance Test   Sit to Stand Able to stand without using hands and stabilize independently   Standing Unsupported Able to stand safely 2 minutes   Sitting with Back Unsupported but Feet Supported on Floor or Stool Able to sit safely and securely 2 minutes   Stand to Sit Sits safely with minimal use of hands   Transfers Able to transfer safely, minor use of hands   Standing Unsupported with Eyes Closed Able to stand 10 seconds safely   Standing Ubsupported with Feet Together Able to place  feet together independently and stand 1 minute safely   From Standing, Reach Forward with Outstretched Arm Can reach confidently >25 cm (10")   From Standing Position, Pick up Object from Floor Able to pick up shoe safely and easily   From Standing Position, Turn to Look Behind Over each Shoulder Looks behind from both sides and weight shifts well   Turn 360 Degrees Able to turn 360 degrees safely in 4 seconds or less   Standing Unsupported, Alternately Place Feet on Step/Stool Able to stand independently and safely and complete 8 steps in 20 seconds   Standing Unsupported, One Foot in Front Able to plae foot ahead of the other independently and hold 30 seconds   Standing on One Leg Able to lift leg independently and hold equal to or more than 3 seconds   Total Score 53   Dynamic Gait Index    Level Surface Normal   Change in Gait Speed Normal   Gait with Horizontal Head Turns Normal   Gait with Vertical Head Turns Normal   Gait and Pivot Turn Normal   Step Over Obstacle Mild Impairment   Step Around Obstacles Mild Impairment   Steps Severe Impairment   Total Score 19             [facility] Adult PT Treatment/Exercise - 05/02/15 0001    Knee/Hip Exercises: Stretches   Piriformis Stretch 30 seconds   Knee/Hip Exercises: Aerobic   Stationary Bike nustep hills 3; Level 3 x 10:00   Knee/Hip Exercises: Standing   Heel Raises 10 reps   Heel Raises Limitations heel- toe raises   Forward Lunges 5 reps;2 sets   Side Lunges 2 sets;5 reps   Functional Squat 5 reps;3 sets   Functional Squat Limitations no weight, 5lb, 10lb weight each set   Rocker Board 2 minutes   Rocker Board Limitations R/L, A/P   Other Standing Knee Exercises sumo walk  knees straight 2 RT            PT Education - 05/02/15 1617    Education provided Yes   Education Details New HEP cancelling out old HEP replaced with sit to stand s and lunges   Person(s) Educated Patient   Methods Explanation;Handout;Demonstration   Comprehension Verbalized understanding;Returned demonstration;Need further instruction          PT Short Term Goals - 05/02/15 1807    PT SHORT TERM GOAL #1   Title Patint will be able to demosntrate increased hip abduction strength of 3+/5 MMT to decrease frequency and severity of trendelenberg sign during gait.    Status On-going   PT SHORT TERM GOAL #2   Title Patient will dmeonstrate increased single leg balance to >3 seconds bilaterally   Status Achieved   PT SHORT TERM GOAL #3   Title Patient will dmeonstrate increased ability to ambualte up and down steps with only 1 HHA to ambualte up and down stairs while carrying groceries.    Status On-going   PT SHORT TERM GOAL #4   Title I with HEP   Status On-going           PT Long Term Goals - 05/02/15 1807    PT LONG  TERM GOAL #1   Title Patint will be able to demosntrate increased hip abduction strength of 4//5 MMT to decrease frequency and severity of trendelenberg sign during gait.    Status On-going   PT LONG TERM GOAL #2   Title Patient will demonstrate increased  single leg balance to >10 seconds bilaterally   Status On-going   PT LONG TERM GOAL #3   Title Patient will dmeosntrate improved DGI score to >19 indicating patient not at high fall risk while walking   Status On-going   PT LONG TERM GOAL #4   Title Patient to demosntrate improved BERG balance score > 52 indicatign patient not at high falls risk.    Status On-going   PT LONG TERM GOAL #5   Title patient wil be able to ambualte up and down stairs withtou UE support.    Status On-going               Plan - 05-30-15 1733    Clinical Impression Statement Patient has made good progress with improvement in BERG and DGI balance scores though still limited perforance onedynamic tasks especially on Rt LE including lunges and and Rt singleleg activities as seen by inability to flex Rt knee >30 degrees on forward and lateral lunges compared to Lt which can flex >50 degrees during exercises. Focus of physical therapy to shift from supine, sitting and prone exercises to standing and functional strengthening based exercises with physical cuing given to prevent Rt knee excessive valgus moment and increased hip abduction activation.    Pt will benefit from skilled therapeutic intervention in order to improve on the following deficits Abnormal gait;Decreased endurance;Improper body mechanics;Decreased strength;Decreased activity tolerance;Difficulty walking;Decreased balance   PT Treatment/Interventions Gait training;Neuromuscular re-education;Stair training;Functional mobility training;Patient/family education;Manual techniques;Therapeutic exercise;Balance training   PT Next Visit Plan Continue standing exercises with tactile and manual cuing to  decrease excessive Rt valgus moment. Introduce dead lifts and/or 4 way pick up and reaches.           G-Codes - 2015-05-30 1731    Functional Assessment Tool Used FOTO Berg alance score and DGI:    Functional Limitation Mobility: Walking and moving around   Mobility: Walking and Moving Around Current Status [phone]) At least 20 percent but less than 40 percent impaired, limited or restricted   Mobility: Walking and Moving Around Goal Status [phone]) At least 1 percent but less than 20 percent impaired, limited or restricted      Problem List Patient Active Problem List   Diagnosis Date Noted  . Hx of diagnostic tests 03/22/2013  . Chronic anticoagulation 02/19/2013  . Paroxysmal atrial fibrillation   . Gastroesophageal reflux disease   . Hyperlipidemia   . Varicose veins of lower extremities with other complications 55/37/4827   Devona Konig PT DPT [place], [place], [address] Phone: [phone]   Fax:  [phone]

## 2015-05-02 NOTE — Patient Instructions (Signed)
Functional Quadriceps: Sit to Stand   Sit on edge of chair, feet flat on floor. Stand upright, extending knees fully. Repeat 5 times per set. Do 3 sets per Day.  http://orth.exer.us/735   Copyright  VHI. All rights reserved.   Lunge Matrix   Combine forward, lateral and posterior lateral lunges: to front (1). to side (2). to back and side (3). Repeat sequence 5 times. No weights     Copyright  VHI. All rights reserved.

## 2015-05-05 ENCOUNTER — Ambulatory Visit (HOSPITAL_COMMUNITY): Payer: Medicare Other

## 2015-05-09 ENCOUNTER — Ambulatory Visit (HOSPITAL_COMMUNITY): Payer: Medicare Other

## 2015-05-12 ENCOUNTER — Encounter (HOSPITAL_COMMUNITY): Payer: Medicare Other | Admitting: Physical Therapy

## 2015-06-12 NOTE — Therapy (Addendum)
[place], [place], [address] Phone: [phone]   Fax:  [phone]  Patient Details  Name: Hailey Graham MRN: [id number] Date of Birth: 09-04-1928 Referring Provider:  Sinda Du, MD  Encounter Date: 06/12/2015  PHYSICAL THERAPY DISCHARGE SUMMARY  Visits from Start of Care:6  Current functional level related to goals / functional outcomes: Patient has not returned since re-assessment on 05/02/15.    Remaining deficits: Abnormal gait;Decreased endurance;Improper body mechanics;Decreased strength;Decreased activity tolerance;Difficulty walking;Decreased balance   Education / Equipment:  HEP, plan of care moving forward  Plan: Patient agrees to discharge.  Patient goals were not met. Patient is being discharged due to not returning since the last visit.  ?????        Deniece Ree PT, DPT [place] [address] [place], [place], [address] Phone: [phone]   Fax:  [phone]

## 2015-06-21 ENCOUNTER — Ambulatory Visit (INDEPENDENT_AMBULATORY_CARE_PROVIDER_SITE_OTHER): Payer: Medicare Other | Admitting: Internal Medicine

## 2015-07-06 ENCOUNTER — Encounter: Payer: Self-pay | Admitting: Cardiovascular Disease

## 2015-07-06 ENCOUNTER — Ambulatory Visit (INDEPENDENT_AMBULATORY_CARE_PROVIDER_SITE_OTHER): Payer: Medicare Other | Admitting: Cardiovascular Disease

## 2015-07-06 VITALS — BP 100/60 | HR 68 | Ht 67.0 in | Wt 177.0 lb

## 2015-07-06 DIAGNOSIS — Z7901 Long term (current) use of anticoagulants: Secondary | ICD-10-CM

## 2015-07-06 DIAGNOSIS — E785 Hyperlipidemia, unspecified: Secondary | ICD-10-CM

## 2015-07-06 DIAGNOSIS — I480 Paroxysmal atrial fibrillation: Secondary | ICD-10-CM

## 2015-07-06 DIAGNOSIS — R2689 Other abnormalities of gait and mobility: Secondary | ICD-10-CM | POA: Diagnosis not present

## 2015-07-06 NOTE — Patient Instructions (Signed)
Continue all current medications. Your physician wants you to follow up in:  1 year.  You will receive a reminder letter in the mail one-two months in advance.  If you don't receive a letter, please call our office to schedule the follow up appointment   

## 2015-07-06 NOTE — Progress Notes (Signed)
Patient ID: Hailey Graham, female   DOB: Feb 20, 1928, 81 y.o.   MRN: [id number]      SUBJECTIVE: The patient presents for annual follow up. She has a cardiovascular history significant for paroxysmal atrial fibrillation for which she is on chronic anticoagulation therapy with rivaroxaban. Her husband Mortimer Fries) is my patient as well. She has not been able to tolerate doses higher than 12.5 mg of metoprolol daily in the past.  She has been having issues with balance. She had a stable negative head CT on 02/28/15 with mild atrophy and white matter changes which were within normal limits for age.  She then went for outpatient physical therapy and attended 6 sessions but did not feel it helped. She blames some of this on herself. She denies dizziness, palpitations, nausea, and vomiting. She denies a history of falls. She is scheduled to see her ophthalmologist in a few weeks, and has a history of glaucoma.   Review of Systems: As per "subjective", otherwise negative.  Allergies  Allergen Reactions  . Codeine   . Morphine   . Sulfa Drugs Cross Reactors     Current Outpatient Prescriptions  Medication Sig Dispense Refill  . ALPRAZolam (XANAX) 0.5 MG tablet Take 0.5 mg by mouth as needed. Uses 1-2 per week sometimes every 2-3 weeks     . bimatoprost (LUMIGAN) 0.01 % SOLN Place 1 drop into the right eye at bedtime.     . cholecalciferol (VITAMIN D) 1000 UNITS tablet Take 2,000 Units by mouth daily.    . dorzolamide (TRUSOPT) 2 % ophthalmic solution Place 1 drop into the right eye 2 (two) times daily.     Marland Kitchen ezetimibe-simvastatin (VYTORIN) 10-20 MG per tablet Take 1 tablet by mouth at bedtime. 30 tablet 6  . metoprolol succinate (TOPROL-XL) 25 MG 24 hr tablet Take 12.5 mg by mouth daily.   11  . pantoprazole (PROTONIX) 40 MG tablet Take 1 tablet (40 mg total) by mouth daily. 90 tablet 3  . Rivaroxaban (XARELTO) 20 MG TABS Take 1 tablet (20 mg total) by mouth daily. 30 tablet 30   No current  facility-administered medications for this visit.    Past Medical History  Diagnosis Date  . Paroxysmal atrial fibrillation     Rate related right bundle branch block; normal EF on echo in 2006; normal stress nuclear-2006; mild RV hypokinesis on MRI;  RA and RV enlargement on CT in 9/06; Anticoagulation->discontinued in 2009  . Gastroesophageal reflux disease   . Hyperlipidemia     Lipid profile in 05/2010:149, 86, 48, 84  . Glaucoma   . Diverticulosis   . Lung nodule     Right lower lobe; stable in 2010    Past Surgical History  Procedure Laterality Date  . Esophagogastroduodenoscopy    . Appendectomy    . Abdominal hysterectomy    . Hernia repair    . Orif ankle fracture  2003    Right    History   Social History  . Marital Status: Married    Spouse Name: N/A  . Number of Children: N/A  . Years of Education: N/A   Occupational History  . Retired    Social History Main Topics  . Smoking status: Never Smoker   . Smokeless tobacco: Never Used  . Alcohol Use: No  . Drug Use: No  . Sexual Activity: Not on file   Other Topics Concern  . Not on file   Social History Narrative     Filed Vitals:  07/06/15 1117  BP: 100/60  Pulse: 68  Height: 5\' 7"  (1.702 m)  Weight: 177 lb (80.287 kg)  SpO2: 99%    PHYSICAL EXAM General: NAD Neck: No JVD, no thyromegaly. Lungs: Clear to auscultation bilaterally with normal respiratory effort. CV: Nondisplaced PMI.Regular rate and rhythm, normal S1/S2, no S3/S4, no murmur. No pretibial or periankle edema. No carotid bruit. Abdomen: Soft, nontender, no distention.  Neurologic: Alert and oriented x 3.  Psych: Normal affect. Extremities: No clubbing or cyanosis.   ECG: Most recent ECG reviewed.      ASSESSMENT AND PLAN: 1. Paroxysmal atrial fibrillation: Symptomatically stable on low-dose metoprolol and Xarelto. Currently in a regular rhythm. No changes.  2. Hyperlipidemia: On Vytorin.  3. Imbalance: Takes  Toprol-XL at night, and on a low dose. I do not feel meds are contributing. Recommended she consider speaking with her PCP about seeing a neurologist, and also encouraged her to consider attending rehab more regularly.  Dispo: f/u 1 year.   Kate Sable, M.D., F.A.C.C.

## 2015-07-31 ENCOUNTER — Encounter: Payer: Self-pay | Admitting: Neurology

## 2015-07-31 ENCOUNTER — Ambulatory Visit (INDEPENDENT_AMBULATORY_CARE_PROVIDER_SITE_OTHER): Payer: Medicare Other | Admitting: Neurology

## 2015-07-31 VITALS — BP 116/78 | HR 68 | Ht 67.0 in | Wt 176.0 lb

## 2015-07-31 DIAGNOSIS — M545 Low back pain, unspecified: Secondary | ICD-10-CM

## 2015-07-31 DIAGNOSIS — R269 Unspecified abnormalities of gait and mobility: Secondary | ICD-10-CM

## 2015-07-31 NOTE — Progress Notes (Signed)
PATIENT: Hailey Graham DOB: November 03, 1928  Chief Complaint  Patient presents with  . Gait Problem    She has been noticing gait changes since January.  Says she feels unsteady when walking but has never fallen.  She had a normal CT head in March 2016.  She went to PT but did not feel it was beneficial.     HISTORICAL  Hailey Graham is 81 years old right-handed female, seen in refer by her primary care physician Dr. Aurelio Jew for evaluation of gait change  I have reviewed and summarized her most recent office visit in July 12 2015  She had a history of atrial fibrillation, is taking Evalina Field,  hypertension, hyperlipidemia I personally reviewed CAT scan in February 28 2015, moderate generalized atrophy, midline cerebellum atrophy, mild small vessel disease, no acute changes  She had a history of intermittent chronic low back pain, radiating pain to bilateral lower extremity, has been managed by chiropractor, she also reported a history of right ankle fracture require surgery in 2008, chronic right knee pain, receiving injection periodically, still has intermittent moderate right lateral knee pain   She noticed gradual onset mild gait difficulty since January 2016, she was carefully in looking at terrain,   especially when she first get up from seated position, she needs to push on chair arm, mild unsteadiness, her gait seems to improve after walking for a while  She denies bilateral upper or lower extremity paresthesia, did not notice any significant weakness, occasionally UTI, but no bowel and bladder incontinence.  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 Codeine   . Morphine   . Sulfa Drugs Cross Reactors     HOME MEDICATIONS: Current Outpatient Prescriptions  Medication Sig Dispense Refill  . ALPRAZolam (XANAX) 0.5 MG tablet Take 0.5 mg by mouth as needed. Uses 1-2 per week sometimes every 2-3 weeks     . bimatoprost (LUMIGAN) 0.01 % SOLN Place 1 drop into the right eye at bedtime.     . cholecalciferol (VITAMIN D) 1000 UNITS tablet Take 2,000 Units by mouth daily.    . dorzolamide (TRUSOPT) 2 % ophthalmic solution Place 1 drop into the right eye 2 (two) times daily.     Marland Kitchen ezetimibe-simvastatin (VYTORIN) 10-20 MG per tablet Take 1 tablet by mouth at bedtime. 30 tablet 6  . metoprolol succinate (TOPROL-XL) 25 MG 24 hr tablet Take 12.5 mg by mouth daily.   11  . pantoprazole (PROTONIX) 40 MG tablet Take 1 tablet (40 mg total) by mouth daily. 90 tablet 3  . Rivaroxaban (XARELTO) 20 MG TABS Take 1 tablet (20 mg total) by mouth daily. 30 tablet 30   No current facility-administered medications for this visit.    PAST MEDICAL HISTORY: Past Medical History  Diagnosis Date  . Paroxysmal atrial fibrillation     Rate related right bundle branch block; normal EF on echo in 2006; normal stress nuclear-2006; mild RV hypokinesis on MRI;  RA and RV enlargement on CT in 9/06; Anticoagulation->discontinued in 2009  . Gastroesophageal reflux disease   . Hyperlipidemia     Lipid profile in 05/2010:149, 86,  48, 84  . Glaucoma   . Diverticulosis   . Lung nodule     Right lower lobe; stable in 2010  . Anxiety   . Depression   . Gait abnormality     PAST SURGICAL HISTORY: Past Surgical History  Procedure Laterality Date  . Esophagogastroduodenoscopy    . Appendectomy    . Abdominal hysterectomy    . Hernia repair    . Orif ankle fracture  2003    Right  . Tonsillectomy      FAMILY HISTORY: Family History  Problem Relation Age of Onset  . Cerebral aneurysm Father   . Healthy Mother   . Lung cancer Sister      SOCIAL HISTORY:  History   Social History  . Marital Status: Married    Spouse Name: N/A  . Number of Children: 0  . Years of Education: 15   Occupational History  . Retired    Social History Main Topics  . Smoking status: Never Smoker   . Smokeless tobacco: Never Used  . Alcohol Use: No  . Drug Use: No  . Sexual Activity: Not on file   Other Topics Concern  . Not on file   Social History Narrative   Lives at home with husband.   Right-handed.   No caffeine use.     PHYSICAL EXAM   Filed Vitals:   07/31/15 1148  BP: 116/78  Pulse: 68  Height: 5\' 7"  (1.702 m)  Weight: 176 lb (79.833 kg)    Not recorded      Body mass index is 27.56 kg/(m^2).  PHYSICAL EXAMNIATION:  Gen: NAD, conversant, well nourised, obese, well groomed                     Cardiovascular: Regular rate rhythm, no peripheral edema, warm, nontender. Eyes: Conjunctivae clear without exudates or hemorrhage Neck: Supple, no carotid bruise. Pulmonary: Clear to auscultation bilaterally   NEUROLOGICAL EXAM:  MENTAL STATUS: Speech:    Speech is normal; fluent and spontaneous with normal comprehension.  Cognition:     Orientation to time, place and person     Normal recent and remote memory     Normal Attention span and concentration     Normal Language, naming, repeating,spontaneous speech     Fund of knowledge   CRANIAL NERVES: CN II: Visual fields are full to confrontation. Fundoscopic exam is normal with sharp discs and no vascular changes. Pupils are round equal and briskly reactive to light. CN III, IV, VI: extraocular movement are normal. No ptosis. CN V: Facial sensation is intact to pinprick in all 3 divisions bilaterally. Corneal responses are intact.  CN VII: Face is symmetric with normal eye closure and smile. CN VIII: Hearing is normal to rubbing fingers CN IX, X: Palate elevates symmetrically. Phonation is normal. CN XI: Head turning and shoulder shrug are intact CN  XII: Tongue is midline with normal movements and no atrophy.  MOTOR: There is no pronator drift of out-stretched arms. Muscle bulk and tone are normal. Muscle strength is normal, with exception of mild bilateral toe flexion, and extension weakness  REFLEXES: Reflexes are 2+ and symmetric at the biceps, triceps, knees, and ankles. Plantar responses are flexor.  SENSORY: Length dependent decreased light touch, pinprick at toes, preserved toe proprioception.   COORDINATION: Rapid alternating movements and fine finger movements are intact. There is no dysmetria on finger-to-nose and heel-knee-shin.    GAIT/STANCE: She needs to push up to get  up from seated position, wide-based, mildly unsteady, bilateral valgrus knee, she could not stand on tiptoe,or heels, could not perform tandem walking   Romberg is absent.   DIAGNOSTIC DATA (LABS, IMAGING, TESTING) - I reviewed patient records, labs, notes, testing and imaging myself where available.   ASSESSMENT AND PLAN  Hailey Graham is a 81 y.o. female    Gait difficulty  Multifactorial, includes right knee pain, distal toes weakness, likely a component of lumbar radiculopathy, brain atrophy, periventricular small vessel disease,deconditioning  CAT scan of the brain showed significant atrophy, cerebellum atrophy, periventricular small vessel disease  Proceed with MRI of the brain, MRI of lumbar,  Continue moderate exercise  Return to clinic in 2 months   Marcial Pacas, M.D. Ph.D.  [facility] [address], [place], [place] [address] Ph: [phone] Fax: [phone]  CC: To Dr. Sinda Du

## 2015-08-07 ENCOUNTER — Encounter (INDEPENDENT_AMBULATORY_CARE_PROVIDER_SITE_OTHER): Payer: Self-pay | Admitting: Internal Medicine

## 2015-08-07 ENCOUNTER — Ambulatory Visit (INDEPENDENT_AMBULATORY_CARE_PROVIDER_SITE_OTHER): Payer: Medicare Other | Admitting: Internal Medicine

## 2015-08-07 VITALS — BP 110/70 | HR 66 | Temp 97.6°F | Resp 18 | Ht 67.0 in | Wt 177.7 lb

## 2015-08-07 DIAGNOSIS — K219 Gastro-esophageal reflux disease without esophagitis: Secondary | ICD-10-CM | POA: Diagnosis not present

## 2015-08-07 MED ORDER — PANTOPRAZOLE SODIUM 40 MG PO TBEC: 40.0000 mg | DELAYED_RELEASE_TABLET | Freq: Every day | ORAL | Status: DC

## 2015-08-07 NOTE — Patient Instructions (Signed)
Can try reducing pantoprazole dose to every other day if tolerated.

## 2015-08-07 NOTE — Progress Notes (Signed)
Presenting complaint;  Follow-up for GERD.  Subjective:  Patient is 81 year old Caucasian female who is here for yearly visit. She remains on pantoprazole. She says every now and then she skips a dose or forgets to take it and does not experience breakthrough symptoms. She has hoarseness on some days but she denies sore throat chronic cough or dysphagia. She is not having any side effects with this medication. She has good appetite and her weight has been stable. She is taking 1 stool softener pill a day and bowels move without difficulty. Lately she has had problems with her balance although she's not had falling episode. She does not feel lightheaded or dizzy. She was evaluated by Dr. Krista Blue of neurology and is scheduled to undergo MR of brain and lumbar spine. She also wants to know if she should undergo screening colonoscopy. Last exam was several years ago by Dr. Verl Blalock. She has been on Xarelto for 3 years. She takes it for atrial fibrillation and has not experienced any problems   Current Medications: Outpatient Encounter Prescriptions as of 08/07/2015  Medication Sig  . ALPRAZolam (XANAX) 0.5 MG tablet Take 0.5 mg by mouth as needed. Uses 1-2 per week sometimes every 2-3 weeks   . bimatoprost (LUMIGAN) 0.01 % SOLN Place 1 drop into the right eye at bedtime.   . cholecalciferol (VITAMIN D) 1000 UNITS tablet Take 2,000 Units by mouth daily.  . dorzolamide (TRUSOPT) 2 % ophthalmic solution Place 1 drop into the right eye 2 (two) times daily.   Marland Kitchen ezetimibe-simvastatin (VYTORIN) 10-20 MG per tablet Take 1 tablet by mouth at bedtime.  . metoprolol succinate (TOPROL-XL) 25 MG 24 hr tablet Take 12.5 mg by mouth daily.   . pantoprazole (PROTONIX) 40 MG tablet Take 1 tablet (40 mg total) by mouth daily.  . Rivaroxaban (XARELTO) 20 MG TABS Take 1 tablet (20 mg total) by mouth daily.   No facility-administered encounter medications on file as of 08/07/2015.     Objective: Blood pressure  110/70, pulse 66, temperature 97.6 F (36.4 C), temperature source Oral, resp. rate 18, height 5\' 7"  (1.702 m), weight 177 lb 11.2 oz (80.604 kg). Patient is alert and in no acute distress. She appears younger than stated age. Conjunctiva is pink. Sclera is nonicteric Oropharyngeal mucosa is normal. No neck masses or thyromegaly noted. Cardiac exam with regular rhythm normal S1 and S2. No murmur or gallop noted. Lungs are clear to auscultation. Abdomen is symmetrical soft and nontender without organomegaly or masses. No LE edema or clubbing noted.    Assessment:  #1. Chronic GERD. She is doing well with therapy. She may try taking PPI every other day.  #2. Patient is average risk for CRC and screening colonoscopy would not be recommended. However if she has changed her bowel habits or rectal bleeding diagnostic evaluation would be considered.  Plan:  She will continue pantoprazole at 40 mg by mouth every morning or every other day. Unless symptoms relapse or she develops dysphagia she will return for office visit in one year.Marland Kitchen

## 2015-08-15 ENCOUNTER — Ambulatory Visit (HOSPITAL_COMMUNITY)
Admission: RE | Admit: 2015-08-15 | Discharge: 2015-08-15 | Disposition: A | Discharge status: Home / Self Care | Payer: Medicare Other | Source: Ambulatory Visit | Attending: Neurology | Admitting: Neurology

## 2015-08-15 ENCOUNTER — Telehealth: Payer: Self-pay | Admitting: Neurology

## 2015-08-15 DIAGNOSIS — G3189 Other specified degenerative diseases of nervous system: Secondary | ICD-10-CM | POA: Insufficient documentation

## 2015-08-15 DIAGNOSIS — M2578 Osteophyte, vertebrae: Secondary | ICD-10-CM | POA: Insufficient documentation

## 2015-08-15 DIAGNOSIS — R531 Weakness: Secondary | ICD-10-CM | POA: Insufficient documentation

## 2015-08-15 DIAGNOSIS — M545 Low back pain, unspecified: Secondary | ICD-10-CM

## 2015-08-15 DIAGNOSIS — R269 Unspecified abnormalities of gait and mobility: Secondary | ICD-10-CM | POA: Insufficient documentation

## 2015-08-15 DIAGNOSIS — M4807 Spinal stenosis, lumbosacral region: Secondary | ICD-10-CM | POA: Diagnosis not present

## 2015-08-15 DIAGNOSIS — R42 Dizziness and giddiness: Secondary | ICD-10-CM | POA: Diagnosis not present

## 2015-08-15 NOTE — Telephone Encounter (Addendum)
MRI lumbar: Showed multilevel degenerative disc disease, with no evidence of canal stenosis MRI of brain: Age-related changes  Keep her previous treatment plan, will go over imaging findings in detail at her next follow-up visit in October    MRI of brain: Mild atrophy and mild chronic microvascular ischemic change, typical for age. No acute abnormality.   MRI of lumbar: Compared with an abdominal CT performed 15 months ago, no acute findings demonstrated. 2. Stable multilevel spondylosis associated with a convex left scoliosis. At both L3-4 and L4-5, there is a grade 1 anterolisthesis secondary to advanced facet disease with mild resulting lateral recess and foraminal narrowing. 3. Chronic moderate left foraminal narrowing at L5-S1 due to osteophytes.

## 2015-08-16 NOTE — Telephone Encounter (Signed)
Informed of results and she will keep her appt in October.

## 2015-08-23 ENCOUNTER — Telehealth: Payer: Self-pay | Admitting: *Deleted

## 2015-08-23 NOTE — Telephone Encounter (Signed)
Had given her 4 bottles of Xarelto 20 mg  (samples) when she was present with her husband for his INR check today Called her to be sure she is having her cbc and bmet done every 6 months on Xarelto and she states that the last blood work was done by Dr Luan Pulling and was in February . Just asked her to check with Edrick Oh in the coumadin clinic next week to be sure she is having blood work followed as indicated on Xarelto and she states understanding

## 2015-09-01 ENCOUNTER — Telehealth: Payer: Self-pay | Admitting: Cardiovascular Disease

## 2015-09-01 DIAGNOSIS — Z5181 Encounter for therapeutic drug level monitoring: Secondary | ICD-10-CM

## 2015-09-01 DIAGNOSIS — I4891 Unspecified atrial fibrillation: Secondary | ICD-10-CM

## 2015-09-01 NOTE — Telephone Encounter (Signed)
Patient would like return phone call regarding "her health". / tg

## 2015-09-01 NOTE — Telephone Encounter (Signed)
Pt is not having any problems.  Is due for Xarelto f/u appt and labs.  Sent labs orders for CBC and BMP to [facility].  Pt will get labs next week and f/u with me on 09/20/15.

## 2015-09-06 ENCOUNTER — Other Ambulatory Visit: Payer: Self-pay | Admitting: Cardiovascular Disease

## 2015-09-06 LAB — CBC
HEMATOCRIT: 41.1 % (ref 36.0–46.0)
Hemoglobin: 13.6 g/dL (ref 12.0–15.0)
MCH: 29.1 pg (ref 26.0–34.0)
MCHC: 33.1 g/dL (ref 30.0–36.0)
MCV: 88 fL (ref 78.0–100.0)
MPV: 10.1 fL (ref 8.6–12.4)
PLATELETS: 237 10*3/uL (ref 150–400)
RBC: 4.67 MIL/uL (ref 3.87–5.11)
RDW: 14.9 % (ref 11.5–15.5)
WBC: 5.5 10*3/uL (ref 4.0–10.5)

## 2015-09-06 LAB — BASIC METABOLIC PANEL
BUN: 16 mg/dL (ref 7–25)
CO2: 24 mmol/L (ref 20–31)
CREATININE: 0.78 mg/dL (ref 0.60–0.88)
Calcium: 10 mg/dL (ref 8.6–10.4)
Chloride: 107 mmol/L (ref 98–110)
Glucose, Bld: 83 mg/dL (ref 65–99)
Potassium: 4.3 mmol/L (ref 3.5–5.3)
Sodium: 141 mmol/L (ref 135–146)

## 2015-09-07 ENCOUNTER — Telehealth: Payer: Self-pay | Admitting: *Deleted

## 2015-09-07 NOTE — Telephone Encounter (Signed)
Notes Recorded by Laurine Blazer, LPN on 09/18/6393 at 3:20 PM Patient notified. Copy fwd to pmd.

## 2015-09-07 NOTE — Telephone Encounter (Signed)
-----   Message from Arnoldo Lenis, MD sent at 09/07/2015  9:49 AM EDT ----- Labs look good  Zandra Abts MD

## 2015-10-02 ENCOUNTER — Encounter: Payer: Self-pay | Admitting: Neurology

## 2015-10-02 ENCOUNTER — Ambulatory Visit (INDEPENDENT_AMBULATORY_CARE_PROVIDER_SITE_OTHER): Payer: Medicare Other | Admitting: Neurology

## 2015-10-02 VITALS — BP 148/85 | HR 60 | Ht 67.0 in | Wt 178.0 lb

## 2015-10-02 DIAGNOSIS — I480 Paroxysmal atrial fibrillation: Secondary | ICD-10-CM

## 2015-10-02 DIAGNOSIS — M4806 Spinal stenosis, lumbar region: Secondary | ICD-10-CM | POA: Diagnosis not present

## 2015-10-02 DIAGNOSIS — M48061 Spinal stenosis, lumbar region without neurogenic claudication: Secondary | ICD-10-CM

## 2015-10-02 DIAGNOSIS — R269 Unspecified abnormalities of gait and mobility: Secondary | ICD-10-CM | POA: Diagnosis not present

## 2015-10-02 NOTE — Progress Notes (Signed)
PATIENT: Hailey Graham DOB: July 02, 1928  Chief Complaint  Patient presents with  . Gait Abnormality    She is here to discuss her MRI results.     HISTORICAL  Hailey Graham is 81 years old right-handed female, seen in refer by her primary care physician Dr. Aurelio Jew for evaluation of gait change  I have reviewed and summarized her most recent office visit in July 12 2015  She had a history of atrial fibrillation, is taking Evalina Field,  hypertension, hyperlipidemia I personally reviewed CAT scan in February 28 2015, moderate generalized atrophy, midline cerebellum atrophy, mild small vessel disease, no acute changes  She had a history of intermittent chronic low back pain, radiating pain to bilateral lower extremity, has been managed by chiropractor, she also reported a history of right ankle fracture require surgery in 2008, chronic right knee pain, receiving injection periodically, still has intermittent moderate right lateral knee pain   She noticed gradual onset mild gait difficulty since January 2016, she was carefully in looking at terrain,   especially when she first get up from seated position, she needs to push on chair arm, mild unsteadiness, her gait seems to improve after walking for a while  She denies bilateral upper or lower extremity paresthesia, did not notice any significant weakness, occasionally UTI, but no bowel and bladder incontinence.  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 At both  L3-4 and L4-5, there is a grade 1 anterolisthesis secondary to advanced facet disease with mild resulting lateral recess and foraminal narrowing. chronic moderate left foraminal narrowing at L5-S1 due to osteophytes.  REVIEW OF SYSTEMS: Full 14 system review of systems performed and notable only for anxiety, joint pain, sleepiness, swelling in legs  ALLERGIES: Allergies  Allergen Reactions  . Codeine   . Morphine   . Sulfa Drugs Cross Reactors     HOME MEDICATIONS: Current Outpatient Prescriptions  Medication Sig Dispense Refill  . ALPRAZolam (XANAX) 0.5 MG tablet Take 0.5 mg by mouth as needed. Uses 1-2 per week sometimes every 2-3 weeks     . bimatoprost (LUMIGAN) 0.01 % SOLN Place 1 drop into the right eye at bedtime.     . cholecalciferol (VITAMIN D) 1000 UNITS tablet Take 2,000 Units by mouth daily.    . dorzolamide (TRUSOPT) 2 % ophthalmic solution Place 1 drop into the right eye 2 (two) times daily.     Marland Kitchen ezetimibe-simvastatin (VYTORIN) 10-20 MG per tablet Take 1 tablet by mouth at bedtime. 30 tablet 6  . metoprolol succinate (TOPROL-XL) 25 MG 24 hr tablet Take 12.5 mg by mouth daily.   11  . pantoprazole (PROTONIX) 40 MG tablet Take 1 tablet (40 mg total) by mouth daily. 90 tablet 3  . Rivaroxaban (XARELTO) 20 MG TABS Take 1 tablet (20 mg total) by mouth daily. 30 tablet 30   No current facility-administered medications for this visit.    PAST MEDICAL HISTORY: Past Medical History  Diagnosis Date  . Paroxysmal atrial fibrillation (HCC)     Rate related  right bundle branch block; normal EF on echo in 2006; normal stress nuclear-2006; mild RV hypokinesis on MRI;  RA and RV enlargement on CT in 9/06; Anticoagulation->discontinued in 2009  . Gastroesophageal reflux disease   . Hyperlipidemia     Lipid profile in 05/2010:149, 86, 48, 84  . Glaucoma   . Diverticulosis   . Lung nodule     Right lower lobe; stable in 2010  . Anxiety   . Depression   . Gait abnormality      PAST SURGICAL HISTORY: Past Surgical History  Procedure Laterality Date  . Esophagogastroduodenoscopy    . Appendectomy    . Abdominal hysterectomy    . Hernia repair    . Orif ankle fracture  2003    Right  . Tonsillectomy      FAMILY HISTORY: Family History  Problem Relation Age of Onset  . Cerebral aneurysm Father   . Healthy Mother   . Lung cancer Sister     SOCIAL HISTORY:  Social History   Social History  . Marital Status: Married    Spouse Name: N/A  . Number of Children: 0  . Years of Education: 15   Occupational History  . Retired    Social History Main Topics  . Smoking status: Never Smoker   . Smokeless tobacco: Never Used  . Alcohol Use: No  . Drug Use: No  . Sexual Activity: Not on file   Other Topics Concern  . Not on file   Social History Narrative   Lives at home with husband.   Right-handed.   No caffeine use.     PHYSICAL EXAM   Filed Vitals:   10/02/15 1115  BP: 148/85  Pulse: 60  Height: 5\' 7"  (1.702 m)  Weight: 178 lb (80.74 kg)    Not recorded      Body mass index is 27.87 kg/(m^2).  PHYSICAL EXAMNIATION:  Gen: NAD, conversant, well nourised, obese, well groomed                     Cardiovascular: Regular rate rhythm, no peripheral edema, warm, nontender. Eyes: Conjunctivae clear without exudates or hemorrhage Neck: Supple, no carotid bruise. Pulmonary: Clear to auscultation bilaterally   NEUROLOGICAL EXAM:  MENTAL STATUS: Speech:    Speech is normal; fluent and spontaneous with normal comprehension.  Cognition:     Orientation to time, place and person     Normal recent and remote memory     Normal Attention span and concentration     Normal Language, naming, repeating,spontaneous speech     Fund of knowledge   CRANIAL NERVES: CN II: Visual fields are full to confrontation. Fundoscopic exam is normal with sharp discs and no vascular changes. Pupils are round equal and briskly reactive to light. CN III,  IV, VI: extraocular movement are normal. No ptosis. CN V: Facial sensation is intact to pinprick in all 3 divisions bilaterally. Corneal responses are intact.  CN VII: Face is symmetric with normal eye closure and smile. CN VIII: Hearing is normal to rubbing fingers CN IX, X: Palate elevates symmetrically. Phonation is normal. CN XI: Head turning and shoulder shrug are intact CN XII: Tongue is midline with normal movements and no atrophy.  MOTOR: There is no pronator drift of out-stretched arms. Muscle bulk and tone are normal. Muscle strength is normal, with exception of mild bilateral ankle dorsiflexion and extension weakness  REFLEXES: Reflexes are 2+ and symmetric at the biceps, triceps, knees, and  ankles. Plantar responses are flexor.  SENSORY: Length dependent decreased light touch, pinprick at toes, preserved toe proprioception.   COORDINATION: Rapid alternating movements and fine finger movements are intact. There is no dysmetria on finger-to-nose and heel-knee-shin.    GAIT/STANCE: She needs to push up to get up from seated position, wide-based, mildly unsteady, bilateral valgrus knee, she could not stand on tiptoe,or heels, could not perform tandem walking   Romberg is absent.   DIAGNOSTIC DATA (LABS, IMAGING, TESTING) - I reviewed patient records, labs, notes, testing and imaging myself where available.   ASSESSMENT AND PLAN  Hailey Graham is a 81 y.o. female    Gait difficulty  Multifactorial, includes right knee pain, distal toes weakness, likely a component of lumbar radiculopathy, brain atrophy, periventricular small vessel disease,deconditioning  Evidence of significant lumbar disease on MRI lumbar:  L3-4 and L4-5, there is a grade 1 anterolisthesis secondary to advanced facet disease with mild resulting lateral r Recess and foraminal narrowing. chronic moderate left foraminal narrowing at L5-S1 due to osteophytes.  Continue moderate exercise  Marcial Pacas, M.D.  Ph.D.  [facility] [address], [place], [place] [address] Ph: [phone] Fax: [phone]  CC: To Dr. Sinda Du

## 2015-10-05 ENCOUNTER — Telehealth: Payer: Self-pay | Admitting: Neurology

## 2015-10-05 NOTE — Telephone Encounter (Signed)
Hinton Dyer please call Tiffany with [facility] regarding the patient. Thank you.

## 2015-10-05 NOTE — Telephone Encounter (Signed)
[facility] is [facility] of network [facility] will see the patient next week.

## 2015-10-26 ENCOUNTER — Ambulatory Visit (INDEPENDENT_AMBULATORY_CARE_PROVIDER_SITE_OTHER): Payer: Medicare Other | Admitting: Cardiovascular Disease

## 2015-10-26 ENCOUNTER — Encounter: Payer: Self-pay | Admitting: Cardiovascular Disease

## 2015-10-26 VITALS — BP 110/66 | HR 67 | Ht 67.0 in | Wt 179.0 lb

## 2015-10-26 DIAGNOSIS — E785 Hyperlipidemia, unspecified: Secondary | ICD-10-CM

## 2015-10-26 DIAGNOSIS — R2689 Other abnormalities of gait and mobility: Secondary | ICD-10-CM | POA: Diagnosis not present

## 2015-10-26 DIAGNOSIS — I480 Paroxysmal atrial fibrillation: Secondary | ICD-10-CM | POA: Diagnosis not present

## 2015-10-26 DIAGNOSIS — Z23 Encounter for immunization: Secondary | ICD-10-CM

## 2015-10-26 NOTE — Patient Instructions (Signed)
Your physician wants you to follow-up in: 1 year from today. You will receive a reminder letter in the mail two months in advance. If you don't receive a letter, please call our office to schedule the follow-up appointment.   Your physician recommends that you continue on your current medications as directed. Please refer to the Current Medication list given to you today.    If you need a refill on your cardiac medications before your next appointment, please call your pharmacy.     Thank you for choosing [facility] !

## 2015-10-26 NOTE — Progress Notes (Signed)
Patient ID: Hailey Graham, female   DOB: 12/19/1928, 81 y.o.   MRN: [id number]      SUBJECTIVE: The patient presents for balance problems, which I addressed at her last visit, and encouraged her to speak with her PCP about seeing a neurologist. An MRI showed mild atrophy and mild chronic microvascular ischemic changes typical for age with no acute abnormalities. She saw neurology (Dr. Krista Blue) who said that her difficulties with gait are multifactorial in etiology. A physical therapist is soon to come to her house. She is also scheduled to see ENT in February.   Review of Systems: As per "subjective", otherwise negative.  Allergies  Allergen Reactions  . Codeine   . Morphine   . Sulfa Drugs Cross Reactors     Current Outpatient Prescriptions  Medication Sig Dispense Refill  . ALPRAZolam (XANAX) 0.5 MG tablet Take 0.5 mg by mouth as needed. Uses 1-2 per week sometimes every 2-3 weeks     . bimatoprost (LUMIGAN) 0.01 % SOLN Place 1 drop into the right eye at bedtime.     . cholecalciferol (VITAMIN D) 1000 UNITS tablet Take 2,000 Units by mouth daily.    . dorzolamide (TRUSOPT) 2 % ophthalmic solution Place 1 drop into the right eye 2 (two) times daily.     Marland Kitchen ezetimibe-simvastatin (VYTORIN) 10-20 MG per tablet Take 1 tablet by mouth at bedtime. 30 tablet 6  . metoprolol succinate (TOPROL-XL) 25 MG 24 hr tablet Take 12.5 mg by mouth daily.   11  . pantoprazole (PROTONIX) 40 MG tablet Take 1 tablet (40 mg total) by mouth daily. 90 tablet 3  . Rivaroxaban (XARELTO) 20 MG TABS Take 1 tablet (20 mg total) by mouth daily. 30 tablet 30   No current facility-administered medications for this visit.    Past Medical History  Diagnosis Date  . Paroxysmal atrial fibrillation (HCC)     Rate related right bundle branch block; normal EF on echo in 2006; normal stress nuclear-2006; mild RV hypokinesis on MRI;  RA and RV enlargement on CT in 9/06; Anticoagulation->discontinued in 2009  .  Gastroesophageal reflux disease   . Hyperlipidemia     Lipid profile in 05/2010:149, 86, 48, 84  . Glaucoma   . Diverticulosis   . Lung nodule     Right lower lobe; stable in 2010  . Anxiety   . Depression   . Gait abnormality     Past Surgical History  Procedure Laterality Date  . Esophagogastroduodenoscopy    . Appendectomy    . Abdominal hysterectomy    . Hernia repair    . Orif ankle fracture  2003    Right  . Tonsillectomy      Social History   Social History  . Marital Status: Married    Spouse Name: N/A  . Number of Children: 0  . Years of Education: 15   Occupational History  . Retired    Social History Main Topics  . Smoking status: Never Smoker   . Smokeless tobacco: Never Used  . Alcohol Use: No  . Drug Use: No  . Sexual Activity: Not on file   Other Topics Concern  . Not on file   Social History Narrative   Lives at home with husband.   Right-handed.   No caffeine use.     Filed Vitals:   10/26/15 1141  BP: 110/66  Pulse: 67  Height: 5\' 7"  (1.702 m)  Weight: 179 lb (81.194 kg)  SpO2: 96%  PHYSICAL EXAM General: NAD HEENT: Normal. Neck: No JVD, no thyromegaly. Lungs: Clear to auscultation bilaterally with normal respiratory effort. CV: Nondisplaced PMI.  Regular rate and rhythm, normal S1/S2, no S3/S4, no murmur. No pretibial or periankle edema.  No carotid bruit.  Normal pedal pulses.  Abdomen: Soft, nontender, no hepatosplenomegaly, no distention.  Neurologic: Alert and oriented x 3.  Psych: Normal affect. Skin: Normal. Musculoskeletal: Normal range of motion, no gross deformities. Extremities: No clubbing or cyanosis.   ECG: Most recent ECG reviewed.      ASSESSMENT AND PLAN: 1. Paroxysmal atrial fibrillation: Symptomatically stable on low-dose metoprolol and Xarelto. Currently in a regular rhythm. No changes. This is not the etiology of her imbalance issues.  2. Hyperlipidemia: On Vytorin.  3. Imbalance: Takes  Toprol-XL at night, and on a low dose. I do not feel meds are contributing. She is awaiting PT and an ENT evaluation.  Dispo: f/u 1 year with [facility].   Kate Sable, M.D., F.A.C.C.

## 2015-11-09 ENCOUNTER — Telehealth: Payer: Self-pay | Admitting: Cardiovascular Disease

## 2015-11-09 NOTE — Telephone Encounter (Signed)
Pt is scheduled for a breat biopsy and needs to know when to go off her blood thinner

## 2015-11-09 NOTE — Telephone Encounter (Signed)
Will forward to Dr. Koneswaran  

## 2015-11-13 ENCOUNTER — Telehealth: Payer: Self-pay | Admitting: Cardiovascular Disease

## 2015-11-13 NOTE — Telephone Encounter (Signed)
Instructions for Xarelto prior to Biopsy  tg

## 2015-11-13 NOTE — Telephone Encounter (Signed)
Patient called to ask when she should stop Xarelto. Patient states that she was told by Janett Billow at [facility] to hold her Xarelto until the day after her biopsy. I spoke with Janett Billow and explained that Dr. Bronson Ing would like the pt to hold her Xarelto for 48 hrs prior to the biopsy.

## 2015-11-14 ENCOUNTER — Ambulatory Visit (INDEPENDENT_AMBULATORY_CARE_PROVIDER_SITE_OTHER): Payer: Medicare Other | Admitting: Urology

## 2015-11-14 DIAGNOSIS — R1032 Left lower quadrant pain: Secondary | ICD-10-CM

## 2015-11-14 DIAGNOSIS — R310 Gross hematuria: Secondary | ICD-10-CM

## 2015-11-15 ENCOUNTER — Encounter: Payer: Self-pay | Admitting: Obstetrics and Gynecology

## 2015-11-21 ENCOUNTER — Telehealth: Payer: Self-pay | Admitting: *Deleted

## 2015-11-21 ENCOUNTER — Other Ambulatory Visit: Payer: Self-pay | Admitting: Radiology

## 2015-11-21 NOTE — Telephone Encounter (Signed)
Late documentation, pt called yesterday and wanted to know if we could get the results of her breast US and other testing that had recently been done. The pt stated that she just wasn't sure what was going on, she knew that she had been scheduled for a left breast biopsy. Pt seemed very anxious about the situation. There were no results in her chart at that time, I advised the pt that I would call [name] and get the results of both tests that were done. The results were faxed to me and I spoke with Dr. Glo Herring about this pt and explained the situation. Dr. Glo Herring personally called the pt and explained in great detail the results and what she could expect from the biopsy she would be having.

## 2015-11-23 ENCOUNTER — Telehealth: Payer: Self-pay | Admitting: Adult Health

## 2015-11-23 NOTE — Telephone Encounter (Signed)
Pt had breast biopsy and it was cancer, has appt next week at [facility], she just had some questions and I tried to answer some for her

## 2015-11-24 ENCOUNTER — Telehealth: Payer: Self-pay

## 2015-11-24 ENCOUNTER — Encounter: Payer: Self-pay | Admitting: *Deleted

## 2015-11-24 ENCOUNTER — Telehealth: Payer: Self-pay | Admitting: *Deleted

## 2015-11-24 DIAGNOSIS — C50412 Malignant neoplasm of upper-outer quadrant of left female breast: Secondary | ICD-10-CM | POA: Insufficient documentation

## 2015-11-24 HISTORY — DX: Malignant neoplasm of upper-outer quadrant of left female breast: C50.412

## 2015-11-24 NOTE — Telephone Encounter (Signed)
Pt called stating she has an appt on Wednesday and cannot get arrangements for transportation. She is seeing the breast conference. This is being set up by [facility]. Early afternoon is best for the patient. Forwarded to [place].

## 2015-11-24 NOTE — Telephone Encounter (Signed)
Confirmed [facility] for 11/29/15 at 1230.  Instructions and contact information given. 

## 2015-11-29 ENCOUNTER — Other Ambulatory Visit: Payer: Self-pay | Admitting: General Surgery

## 2015-11-29 ENCOUNTER — Other Ambulatory Visit (HOSPITAL_BASED_OUTPATIENT_CLINIC_OR_DEPARTMENT_OTHER): Payer: Medicare Other

## 2015-11-29 ENCOUNTER — Encounter: Payer: Self-pay | Admitting: Hematology and Oncology

## 2015-11-29 ENCOUNTER — Ambulatory Visit (HOSPITAL_BASED_OUTPATIENT_CLINIC_OR_DEPARTMENT_OTHER): Payer: Medicare Other | Admitting: Hematology and Oncology

## 2015-11-29 ENCOUNTER — Ambulatory Visit: Payer: Medicare Other | Admitting: Physical Therapy

## 2015-11-29 ENCOUNTER — Ambulatory Visit
Admission: RE | Admit: 2015-11-29 | Discharge: 2015-11-29 | Disposition: A | Discharge status: Home / Self Care | Payer: Medicare Other | Source: Ambulatory Visit | Attending: Radiation Oncology | Admitting: Radiation Oncology

## 2015-11-29 ENCOUNTER — Other Ambulatory Visit: Payer: Medicare Other

## 2015-11-29 ENCOUNTER — Encounter: Payer: Self-pay | Admitting: Nurse Practitioner

## 2015-11-29 VITALS — BP 145/78 | HR 65 | Temp 97.9°F | Resp 20 | Ht 67.0 in | Wt 178.9 lb

## 2015-11-29 DIAGNOSIS — C50412 Malignant neoplasm of upper-outer quadrant of left female breast: Secondary | ICD-10-CM

## 2015-11-29 DIAGNOSIS — I480 Paroxysmal atrial fibrillation: Secondary | ICD-10-CM

## 2015-11-29 DIAGNOSIS — D0512 Intraductal carcinoma in situ of left breast: Secondary | ICD-10-CM | POA: Diagnosis not present

## 2015-11-29 LAB — CBC WITH DIFFERENTIAL/PLATELET
BASO%: 0.5 % (ref 0.0–2.0)
Basophils Absolute: 0 10*3/uL (ref 0.0–0.1)
EOS%: 1.4 % (ref 0.0–7.0)
Eosinophils Absolute: 0.1 10*3/uL (ref 0.0–0.5)
HCT: 41.1 % (ref 34.8–46.6)
HEMOGLOBIN: 13.2 g/dL (ref 11.6–15.9)
LYMPH%: 29.8 % (ref 14.0–49.7)
MCH: 28.9 pg (ref 25.1–34.0)
MCHC: 32.1 g/dL (ref 31.5–36.0)
MCV: 90.1 fL (ref 79.5–101.0)
MONO#: 0.5 10*3/uL (ref 0.1–0.9)
MONO%: 9.7 % (ref 0.0–14.0)
NEUT#: 3.3 10*3/uL (ref 1.5–6.5)
NEUT%: 58.6 % (ref 38.4–76.8)
Platelets: 222 10*3/uL (ref 145–400)
RBC: 4.56 10*6/uL (ref 3.70–5.45)
RDW: 15.1 % — AB (ref 11.2–14.5)
WBC: 5.6 10*3/uL (ref 3.9–10.3)
lymph#: 1.7 10*3/uL (ref 0.9–3.3)

## 2015-11-29 LAB — COMPREHENSIVE METABOLIC PANEL
ALBUMIN: 3.6 g/dL (ref 3.5–5.0)
ALT: 12 U/L (ref 0–55)
AST: 15 U/L (ref 5–34)
Alkaline Phosphatase: 79 U/L (ref 40–150)
Anion Gap: 9 mEq/L (ref 3–11)
BUN: 21.9 mg/dL (ref 7.0–26.0)
CHLORIDE: 110 meq/L — AB (ref 98–109)
CO2: 23 mEq/L (ref 22–29)
Calcium: 9.9 mg/dL (ref 8.4–10.4)
Creatinine: 0.8 mg/dL (ref 0.6–1.1)
EGFR: 69 mL/min/{1.73_m2} — ABNORMAL LOW (ref >90)
GLUCOSE: 87 mg/dL (ref 70–140)
POTASSIUM: 3.9 meq/L (ref 3.5–5.1)
SODIUM: 142 meq/L (ref 136–145)
Total Bilirubin: 0.43 mg/dL (ref 0.20–1.20)
Total Protein: 7 g/dL (ref 6.4–8.3)

## 2015-11-29 NOTE — Progress Notes (Signed)
Hailey Graham is a very pleasant 81 y.o. female from [place], [place] with newly diagnosed invasive ductal carcinoma of the left breast.  Tumor markers are still pending at the time of this visit for ER/PR status, but negative for HER2/neu.  She presents today to the [facility]) for treatment consideration and recommendations from the breast surgeon, radiation oncologist, and medical oncologist.     I briefly met with Hailey Graham during her [facility] visit today. We discussed the purpose of the Survivorship Clinic, which will include monitoring for recurrence, coordinating completion of age and gender-appropriate cancer screenings, promotion of overall wellness, as well as managing potential late/long-term side effects of anti-cancer treatments.    The treatment plan for Hailey Graham will likely include surgery and potentially anti-estrogen therapy, depending on the status of her hormonal markers.  As of today, the intent of treatment for Hailey Graham is cure, therefore she will be eligible for the Survivorship Clinic upon her completion of treatment.  Her survivorship care plan (SCP) document will be drafted and updated throughout the course of her treatment trajectory. She will receive the SCP in an office visit with myself in the Survivorship Clinic once she has completed treatment.   I also provided Hailey Graham with a folder of information related to the Patient and Family Arts development officer and programs offered here at the [employer], along with contact information for [place], [facility].  We discussed some of the various programs and services and I encouraged her to consider taking part in them, based on her interest and ability.  Hailey Graham was encouraged to ask questions and all questions were answered to her satisfaction.  She was given my business card and encouraged to contact me with any concerns regarding survivorship.  I look forward to participating in her care.   Kenn File, [place] [phone]

## 2015-11-29 NOTE — Progress Notes (Signed)
[facility] NEW PATIENT EVALUATION  Name: Hailey Graham MRN: [id number]  Date:   11/29/2015           DOB: [birth year]  Status: outpatient   CC: Alonza Bogus, MD  Excell Seltzer, MD    REFERRING PHYSICIAN: Excell Seltzer, MD   DIAGNOSIS: Clinical stage I A (T1 N0 M0) invasive ductal/DCIS of the left breast   HISTORY OF PRESENT ILLNESS:  Hailey Graham is a 81 y.o. female who is seen today at the breast multidisciplinary clinic through the courtesy of Dr. Excell Seltzer for evaluation of her T1 N0 invasive ductal/DCIS of the left breast.  At the time of mammography at [facility] on 11/02/2015 she was found to have an asymmetric density within the upper-outer quadrant of the left breast.  Ultrasound showed a 0.6 cm mass at 2:00, posterior depth.  Ultrasound of the axilla was benign.  Ultrasound-guided biopsy on 11/21/2015 was diagnostic for invasive ductal/DCIS.  There was much more DCIS than invasive disease which appear to be focally invasive.  Hormone receptors will be available tomorrow.  HER-2/neu is negative.  She is without complaints today.  She is seen today with Dr. Excell Seltzer and Dr. Lindi Adie.  PREVIOUS RADIATION THERAPY: No   PAST MEDICAL HISTORY:  has a past medical history of Paroxysmal atrial fibrillation ([place]); Gastroesophageal reflux disease; Hyperlipidemia; Glaucoma; Diverticulosis; Lung nodule; Anxiety; Depression; Gait abnormality; Breast cancer of upper-outer quadrant of left female breast ([place]) (11/24/2015); and Breast cancer of upper-outer quadrant of left female breast ([place]) (11/24/2015).     PAST SURGICAL HISTORY:  Past Surgical History  Procedure Laterality Date  . Esophagogastroduodenoscopy    . Appendectomy    . Abdominal hysterectomy    . Hernia repair    . Orif ankle fracture  2003    Right  . Tonsillectomy       FAMILY HISTORY: family history includes Cerebral aneurysm in her father; Healthy in her mother; Lung cancer in her  sister.  Her father died from a cerebral aneurysm at 56.  Her mother died from old age.  No family history of breast or ovarian cancer.   SOCIAL HISTORY:  reports that she has never smoked. She has never used smokeless tobacco. She reports that she does not drink alcohol or use illicit drugs.  Married, no children.  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  Her husband has medical issues that need her daily attention.   ALLERGIES: Codeine; Morphine; and Sulfa drugs cross reactors   MEDICATIONS:  Current Outpatient Prescriptions  Medication Sig Dispense Refill  . apraclonidine (IOPIDINE) 0.5 % ophthalmic solution 1 drop.    . bimatoprost (LUMIGAN) 0.01 % SOLN Place 1 drop into the right eye at bedtime.     . cholecalciferol (VITAMIN D) 1000 UNITS tablet Take 2,000 Units by mouth daily.    . dorzolamide (TRUSOPT) 2 % ophthalmic solution Place 1 drop into the right eye 2 (two) times daily.     Marland Kitchen ezetimibe-simvastatin (VYTORIN) 10-20 MG per tablet Take 1 tablet by mouth at bedtime. 30 tablet 6  . metoprolol succinate (TOPROL-XL) 25 MG 24 hr tablet Take 12.5 mg by mouth daily.   11  . Rivaroxaban (XARELTO) 20 MG TABS Take 1 tablet (20 mg total) by mouth daily. 30 tablet 30   No current facility-administered medications for this encounter.     REVIEW OF SYSTEMS:  Pertinent items are noted in HPI.  PHYSICAL EXAM:  Alert and oriented 81 year old white female appearing younger than her stated age. Wt Readings from Last 3 Encounters:  11/29/15 178 lb 14.4 oz (81.149 kg)  10/26/15 179 lb (81.194 kg)  10/02/15 178 lb (80.74 kg)   Temp Readings from Last 3 Encounters:  11/29/15 97.9 F (36.6 C) Oral  08/07/15 97.6 F (36.4 C) Oral  06/20/14 97.6 F (36.4 C)    BP Readings from Last 3 Encounters:  11/29/15 145/78  10/26/15 110/66  10/02/15 148/85   Pulse Readings from Last 3 Encounters:  11/29/15 65  10/26/15 67  10/02/15 60   Head  and neck examination: Grossly unremarkable.  Nodes: Without palpable cervical, supraclavicular, or axillary lymphadenopathy.  Breasts: There is a punctate biopsy wound at approximately 3:00 along the lateral aspect of left breast.  There is a small area of ecchymosis adjacent to the biopsy wound.  No masses are appreciated.  Right breast without masses or lesions.  Extremities: Without edema.    LABORATORY DATA:  Lab Results  Component Value Date   WBC 5.6 11/29/2015   HGB 13.2 11/29/2015   HCT 41.1 11/29/2015   MCV 90.1 11/29/2015   PLT 222 11/29/2015   Lab Results  Component Value Date   NA 142 11/29/2015   K 3.9 11/29/2015   CL 107 09/06/2015   CO2 23 11/29/2015   Lab Results  Component Value Date   ALT 12 11/29/2015   AST 15 11/29/2015   ALKPHOS 79 11/29/2015   BILITOT 0.43 11/29/2015      IMPRESSION: Clinical stage I A (T1 N0 M0) invasive ductal/DCIS of the left breast.  We discussed breast conservation surgery versus mastectomy.  I feel that she would be a candidate for a left partial mastectomy.  In view of her age and what appears to be minimal invasive disease I do not feel there is any clinical benefit to breast irradiation, particularly if she is hormone receptor positive.  A final decision can be made following review of her partial mastectomy pathology.  I do not feel that she needs a sentinel lymph node biopsy.  Dr. Lindi Adie will determine whether not she would benefit from antiestrogen therapy.   PLAN: As discussed above.  Dr. Lisbeth Renshaw can see her for a follow-up visit if clinically indicated.  I spent 30  minutes face to face with the patient and more than 50% of that time was spent in counseling and/or coordination of care.

## 2015-11-29 NOTE — Assessment & Plan Note (Signed)
left breast biopsy 11/21/2015: Invasive ductal carcinoma with associated low-grade DCIS with microcalcifications focally involving a papilloma, ER/PR pending, HER-2 negative ratio 1.22, 6 mm asymmetric left breast density at 2:00 position, axilla negative, 2 internal mammary lymph nodes, T1 BN 0 stage I a clinical stage  Pathology counseling:Discussed with the patient, the details of pathology including the type of breast cancer,the clinical staging, the significance of ER, PR and HER-2/neu receptors and the implications for treatment. After reviewing the pathology in detail, we proceeded to discuss the different treatment options between surgery, radiation, chemotherapy, antiestrogen therapies.  Recommendation: 1. Breast conserving surgery alone 2. We will await the results of final pathology before discussing the pros and cons of antiestrogen therapy. This could be based on ER/PR receptor status as well as her performance status as well as the size of the final tumor as opposed to DCIS.  Return to clinic after surgery to discuss adjuvant treatment plan.

## 2015-11-29 NOTE — Progress Notes (Signed)
[facility] CONSULT NOTE  Patient Care Team: Sinda Du, MD as PCP - General (Internal Medicine) Rogene Houston, MD (Gastroenterology) Excell Seltzer, MD as Consulting Physician (General Surgery) Nicholas Lose, MD as Consulting Physician (Hematology and Oncology) Arloa Koh, MD as Consulting Physician (Radiation Oncology)  CHIEF COMPLAINTS/PURPOSE OF CONSULTATION:  Newly diagnosed breast cancer  HISTORY OF PRESENTING ILLNESS:  Hailey Graham 81 y.o. female is here because of recent diagnosis of left breast cancer. Patient had a routine screening mammogram that revealed asymmetric density in the left breast along with 2 intramammary lymph nodes ear ultrasound of the breast revealed a 6 mm lesion at 2:00 position. Axilla was negative. Biopsy was performed by ultrasound guidance. This was done on 11/21/2015 that showed low-grade DCIS with microcalcifications with a small focus of invasive ductal carcinoma ER/PR pending and HER-2 was negative. She was present this morning in the multidisciplinary tumor board and she is here today to discuss a treatment plan. She is accompanied by 2 of her friends.  I reviewed her records extensively and collaborated the history with the patient.  SUMMARY OF ONCOLOGIC HISTORY:   Breast cancer of upper-outer quadrant of left female breast ([place])   11/02/2015 Mammogram screening mammogram category B breast density, asymmetry with indistinct margin in the left breast upper outer quadrant posterior depth, 2 internal mammary lymph nodes, 6 mm at 2:00 position axillary negative   11/21/2015 Initial Diagnosis left breast biopsy: Invasive ductal carcinoma with associated low-grade DCIS with microcalcifications focally involving a papilloma, ER/PR pending, HER-2 negative ratio 1.22    In terms of breast cancer risk profile:  She menarched at early age of 82 and went to menopause at age 45  She had no children  She has not received birth control  pills.  She was never exposed to fertility medications or hormone replacement therapy.  She has no family history of Breast/GYN/GI cancer  MEDICAL HISTORY:  Past Medical History  Diagnosis Date  . Paroxysmal atrial fibrillation ([facility])     Rate related right bundle branch block; normal EF on echo in 2006; normal stress nuclear-2006; mild RV hypokinesis on MRI;  RA and RV enlargement on CT in 9/06; Anticoagulation->discontinued in 2009  . Gastroesophageal reflux disease   . Hyperlipidemia     Lipid profile in 05/2010:149, 86, 48, 84  . Glaucoma   . Diverticulosis   . Lung nodule     Right lower lobe; stable in 2010  . Anxiety   . Depression   . Gait abnormality   . Breast cancer of upper-outer quadrant of left female breast ([place]) 11/24/2015  . Breast cancer of upper-outer quadrant of left female breast ([place]) 11/24/2015    SURGICAL HISTORY: Past Surgical History  Procedure Laterality Date  . Esophagogastroduodenoscopy    . Appendectomy    . Abdominal hysterectomy    . Hernia repair    . Orif ankle fracture  2003    Right  . Tonsillectomy      SOCIAL HISTORY: Social History   Social History  . Marital Status: Married    Spouse Name: N/A  . Number of Children: 0  . Years of Education: 15   Occupational History  . Retired    Social History Main Topics  . Smoking status: Never Smoker   . Smokeless tobacco: Never Used  . Alcohol Use: No  . Drug Use: No  . Sexual Activity: Not on file   Other Topics Concern  . Not on file  Social History Narrative   Lives at home with husband.   Right-handed.   No caffeine use.    FAMILY HISTORY: Family History  Problem Relation Age of Onset  . Cerebral aneurysm Father   . Healthy Mother   . Lung cancer Sister     ALLERGIES:  is allergic to codeine; morphine; and sulfa drugs cross reactors.  MEDICATIONS:  Current Outpatient Prescriptions  Medication Sig Dispense Refill  . apraclonidine (IOPIDINE) 0.5 % ophthalmic  solution 1 drop.    . bimatoprost (LUMIGAN) 0.01 % SOLN Place 1 drop into the right eye at bedtime.     . cholecalciferol (VITAMIN D) 1000 UNITS tablet Take 2,000 Units by mouth daily.    . dorzolamide (TRUSOPT) 2 % ophthalmic solution Place 1 drop into the right eye 2 (two) times daily.     Marland Kitchen ezetimibe-simvastatin (VYTORIN) 10-20 MG per tablet Take 1 tablet by mouth at bedtime. 30 tablet 6  . metoprolol succinate (TOPROL-XL) 25 MG 24 hr tablet Take 12.5 mg by mouth daily.   11  . Rivaroxaban (XARELTO) 20 MG TABS Take 1 tablet (20 mg total) by mouth daily. 30 tablet 30   No current facility-administered medications for this visit.    REVIEW OF SYSTEMS:   Constitutional: Denies fevers, chills or abnormal night sweats Eyes: Denies blurriness of vision, double vision or watery eyes Ears, nose, mouth, throat, and face: Denies mucositis or sore throat Respiratory: Denies cough, dyspnea or wheezes Cardiovascular: atrial fibrillation Gastrointestinal:  Denies nausea, heartburn or change in bowel habits Skin: Denies abnormal skin rashes Lymphatics: Denies new lymphadenopathy or easy bruising Neurological:Denies numbness, tingling or new weaknesses Behavioral/Psych: Mood is stable, no new changes  Breast:  Denies any palpable lumps or discharge All other systems were reviewed with the patient and are negative.  PHYSICAL EXAMINATION: ECOG PERFORMANCE STATUS: 0 - Asymptomatic  Filed Vitals:   11/29/15 1300  BP: 145/78  Pulse: 65  Temp: 97.9 F (36.6 C)  Resp: 20   Filed Weights   11/29/15 1300  Weight: 178 lb 14.4 oz (81.149 kg)    GENERAL:alert, no distress and comfortable SKIN: skin color, texture, turgor are normal, no rashes or significant lesions EYES: normal, conjunctiva are pink and non-injected, sclera clear OROPHARYNX:no exudate, no erythema and lips, buccal mucosa, and tongue normal  NECK: supple, thyroid normal size, non-tender, without nodularity LYMPH:  no palpable  lymphadenopathy in the cervical, axillary or inguinal LUNGS: clear to auscultation and percussion with normal breathing effort HEART: occasional skipped beats ABDOMEN:abdomen soft, non-tender and normal bowel sounds Musculoskeletal:no cyanosis of digits and no clubbing  PSYCH: alert & oriented x 3 with fluent speech NEURO: no focal motor/sensory deficits BREAST: No palpable nodules in breast. No palpable axillary or supraclavicular lymphadenopathy (exam performed in the presence of a chaperone)   LABORATORY DATA:  I have reviewed the data as listed Lab Results  Component Value Date   WBC 5.6 11/29/2015   HGB 13.2 11/29/2015   HCT 41.1 11/29/2015   MCV 90.1 11/29/2015   PLT 222 11/29/2015   Lab Results  Component Value Date   NA 142 11/29/2015   K 3.9 11/29/2015   CL 107 09/06/2015   CO2 23 11/29/2015   ASSESSMENT AND PLAN:  Breast cancer of upper-outer quadrant of left female breast ([facility]) left breast biopsy 11/21/2015: Invasive ductal carcinoma with associated low-grade DCIS with microcalcifications focally involving a papilloma, ER/PR pending, HER-2 negative ratio 1.22, 6 mm asymmetric left breast density at 2:00 position,  axilla negative, 2 internal mammary lymph nodes, T1 BN 0 stage I a clinical stage  Pathology counseling:Discussed with the patient, the details of pathology including the type of breast cancer,the clinical staging, the significance of ER, PR and HER-2/neu receptors and the implications for treatment. After reviewing the pathology in detail, we proceeded to discuss the different treatment options between surgery, radiation, chemotherapy, antiestrogen therapies.  Recommendation: 1. Breast conserving surgery alone 2. We will await the results of final pathology before discussing the pros and cons of antiestrogen therapy. This could be based on ER/PR receptor status as well as her performance status as well as the size of the final tumor as opposed to DCIS.  Return  to clinic after surgery to discuss adjuvant treatment plan.    All questions were answered. The patient knows to call the clinic with any problems, questions or concerns.    Rulon Eisenmenger, MD 2:07 PM

## 2015-11-29 NOTE — Progress Notes (Signed)
Note created by Dr. Gudena during office visit. Copy to patient, original to scan. 

## 2015-12-01 ENCOUNTER — Encounter: Payer: Self-pay | Admitting: General Practice

## 2015-12-01 NOTE — Progress Notes (Signed)
[facility] Psychosocial Distress Screening Spiritual Care  Spiritual Care was referred by distress screening protocol.  The patient scored a 8 on the Psychosocial Distress Thermometer which indicates severe distress. Followed up by phone to assess for distress and other psychosocial needs.   ONCBCN DISTRESS SCREENING 12/01/2015  Screening Type Initial Screening  Distress experienced in past week (1-10) 8  Emotional problem type Nervousness/Anxiety;Adjusting to illness  Physical Problem type Getting around;Changes in urination  Referral to support programs Yes  Other Spiritual Care   Per pt via phone, her distress level after details/plan provided in [facility] is 2:  "I think I've accepted [my dx] very well."  Ms Verdine states that she has two close friends who are supportive, and a niece lives in [place].  Per pt, her husband has had unsuccessful back surgeries in the past three years and only leaves home for MD appointments; pt does not report significant distress from caregiving.  She notes relief that plan is surgery with no further tx.  Follow up needed: No.  Pt aware of ongoing Support Team availability if needs change.  [place], [place], [facility] Pager [phone] Voicemail  [phone]

## 2015-12-04 ENCOUNTER — Telehealth: Payer: Self-pay | Admitting: *Deleted

## 2015-12-04 NOTE — Telephone Encounter (Signed)
"  Has Dr. Lindi Adie has received pathology report on the estrogen, progesterone receptors.  I know no chemotherapy or radiation therapy will be done but would like to know what I will be taking after surgery by Dr. Excell Seltzer.  Please call early morning or after three o'clock as I am the caregiver for my husband who is ill.  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

## 2015-12-05 ENCOUNTER — Encounter: Payer: Self-pay | Admitting: *Deleted

## 2015-12-05 ENCOUNTER — Telehealth: Payer: Self-pay | Admitting: *Deleted

## 2015-12-05 NOTE — Telephone Encounter (Signed)
Spoke with patient from [facility] 12/7.  She is doing well.  She is anxious to have surgery before the end of the year due to her insurance is changing.  She will need cardiac clearance and she is contacting her cardiologist to get this moving.  Dr. Lear Ng office is also contacting her cardiologist as well.  Encourage her to call with any needs or concerns.

## 2015-12-08 ENCOUNTER — Encounter: Payer: Self-pay | Admitting: *Deleted

## 2015-12-08 ENCOUNTER — Telehealth: Payer: Self-pay | Admitting: Hematology and Oncology

## 2015-12-08 NOTE — Telephone Encounter (Signed)
sw pt and advised on DEC appt...pt ok and aware °

## 2015-12-11 ENCOUNTER — Telehealth: Payer: Self-pay | Admitting: *Deleted

## 2015-12-11 NOTE — Pre-Procedure Instructions (Addendum)
    Hailey Graham  12/11/2015     Your procedure is scheduled on Wednesday, December 21.  Report to [facility] Admitting after your 1:00PM [name] appoiment.               Your surgery is scheduled for 12:30 PM   Call this number if you have problems the morning of surgery:[phone]                  Remember:  Do not eat food or drink liquids after midnight.  Take these medicines the morning of surgery with A SIP OF WATER :metoprolol succinate (TOPROL-XL).                Take if needed:pantoprazole (PROTONIX), Xanax.                 May use Eye Drops.             Stop Xarelto as instructed by Dr.            Lazaro Arms not take any Aspirin or Aspirin Products, Herbal Medications, Ibuprofen (Advil), Naproxen (Aleve).   Do not wear jewelry, make-up or nail polish.  Do not wear lotions, powders, or perfumes.    Do not shave 48 hours prior to surgery.    Do not bring valuables to the hospital.  [facility] is not responsible for any belongings or valuables.  Contacts, dentures or bridgework may not be worn into surgery.  Leave your suitcase in the car.  After surgery it may be brought to your room.  For patients admitted to the hospital, discharge time will be determined by your treatment team.  Patients discharged the day of surgery will not be allowed to drive home.   Name and phone number of your driver:   -  Special instructions:  Review  [facility] - Preparing For Surgery.  Please read over the following fact sheets that you were given. Pain Booklet, Coughing and Deep Breathing and Surgical Site Infection Prevention

## 2015-12-11 NOTE — Pre-Procedure Instructions (Addendum)
    NAJAYAH KAMINER  12/11/2015     Your procedure is scheduled on Wednesday, December 21.  Report to [facility] Admitting after your 1:00PM [name] appoiment.               Your surgery is scheduled for 12:30 PM   Call this number if you have problems the morning of [phone]                  Remember:  Do not eat food or drink liquids after midnight.  Take these medicines the morning of surgery with A SIP OF WATER :metoprolol succinate (TOPROL-XL).                Take if needed:pantoprazole (PROTONIX).                 May use Eye Drops.    Do not wear jewelry, make-up or nail polish.  Do not wear lotions, powders, or perfumes.    Do not shave 48 hours prior to surgery.    Do not bring valuables to the hospital.  [facility] is not responsible for any belongings or valuables.  Contacts, dentures or bridgework may not be worn into surgery.  Leave your suitcase in the car.  After surgery it may be brought to your room.  For patients admitted to the hospital, discharge time will be determined by your treatment team.  Patients discharged the day of surgery will not be allowed to drive home.   Name and phone number of your driver:   -  Special instructions:  Review  [facility] - Preparing For Surgery.  Please read over the following fact sheets that you were given. Pain Booklet, Coughing and Deep Breathing and Surgical Site Infection Prevention

## 2015-12-11 NOTE — Telephone Encounter (Signed)
Pt needed to know what year her hysterectomy.

## 2015-12-12 ENCOUNTER — Encounter (HOSPITAL_COMMUNITY)
Admission: RE | Admit: 2015-12-12 | Discharge: 2015-12-12 | Disposition: A | Discharge status: Home / Self Care | Payer: Medicare Other | Source: Ambulatory Visit | Attending: General Surgery | Admitting: General Surgery

## 2015-12-12 ENCOUNTER — Telehealth: Payer: Self-pay | Admitting: *Deleted

## 2015-12-12 ENCOUNTER — Encounter (HOSPITAL_COMMUNITY): Payer: Self-pay

## 2015-12-12 DIAGNOSIS — Z86718 Personal history of other venous thrombosis and embolism: Secondary | ICD-10-CM | POA: Diagnosis not present

## 2015-12-12 DIAGNOSIS — M1990 Unspecified osteoarthritis, unspecified site: Secondary | ICD-10-CM | POA: Diagnosis not present

## 2015-12-12 DIAGNOSIS — K219 Gastro-esophageal reflux disease without esophagitis: Secondary | ICD-10-CM | POA: Diagnosis not present

## 2015-12-12 DIAGNOSIS — C50412 Malignant neoplasm of upper-outer quadrant of left female breast: Secondary | ICD-10-CM | POA: Diagnosis present

## 2015-12-12 DIAGNOSIS — Z170 Estrogen receptor positive status [ER+]: Secondary | ICD-10-CM | POA: Diagnosis not present

## 2015-12-12 DIAGNOSIS — Z7901 Long term (current) use of anticoagulants: Secondary | ICD-10-CM | POA: Diagnosis not present

## 2015-12-12 DIAGNOSIS — Z86711 Personal history of pulmonary embolism: Secondary | ICD-10-CM | POA: Diagnosis not present

## 2015-12-12 DIAGNOSIS — I4891 Unspecified atrial fibrillation: Secondary | ICD-10-CM | POA: Diagnosis not present

## 2015-12-12 DIAGNOSIS — Z79899 Other long term (current) drug therapy: Secondary | ICD-10-CM | POA: Diagnosis not present

## 2015-12-12 HISTORY — DX: Unspecified osteoarthritis, unspecified site: M19.90

## 2015-12-12 HISTORY — DX: Adverse effect of unspecified anesthetic, initial encounter: T41.45XA

## 2015-12-12 HISTORY — DX: Nausea with vomiting, unspecified: R11.2

## 2015-12-12 HISTORY — DX: Personal history of urinary calculi: Z87.442

## 2015-12-12 HISTORY — DX: Other specified postprocedural states: Z98.890

## 2015-12-12 HISTORY — DX: Other complications of anesthesia, initial encounter: T88.59XA

## 2015-12-12 LAB — CBC
HEMATOCRIT: 44.1 % (ref 36.0–46.0)
Hemoglobin: 14.2 g/dL (ref 12.0–15.0)
MCH: 29.3 pg (ref 26.0–34.0)
MCHC: 32.2 g/dL (ref 30.0–36.0)
MCV: 90.9 fL (ref 78.0–100.0)
PLATELETS: 215 10*3/uL (ref 150–400)
RBC: 4.85 MIL/uL (ref 3.87–5.11)
RDW: 14.9 % (ref 11.5–15.5)
WBC: 6.4 10*3/uL (ref 4.0–10.5)

## 2015-12-12 LAB — BASIC METABOLIC PANEL
Anion gap: 5 (ref 5–15)
BUN: 24 mg/dL — AB (ref 6–20)
CALCIUM: 9.9 mg/dL (ref 8.9–10.3)
CO2: 28 mmol/L (ref 22–32)
CREATININE: 0.81 mg/dL (ref 0.44–1.00)
Chloride: 109 mmol/L (ref 101–111)
GFR calc Af Amer: >60 mL/min (ref >60)
GLUCOSE: 108 mg/dL — AB (ref 65–99)
Potassium: 4.3 mmol/L (ref 3.5–5.1)
Sodium: 142 mmol/L (ref 135–145)

## 2015-12-12 MED ORDER — CHLORHEXIDINE GLUCONATE 4 % EX LIQD: 1.0000 "application " | Freq: Once | CUTANEOUS | Status: DC

## 2015-12-12 MED ORDER — CEFAZOLIN SODIUM-DEXTROSE 2-3 GM-% IV SOLR
2.0000 g | INTRAVENOUS | Status: AC
  Administered 2015-12-13: 2 g via INTRAVENOUS
  Filled 2015-12-12: qty 50

## 2015-12-12 NOTE — Progress Notes (Signed)
Hailey Graham denies chest pain or shortness of breath or palpations.   Hailey Graham reports that her right lower leg swells at times during the day, but swelling goes down at night.  This started after vein surgery.

## 2015-12-12 NOTE — Telephone Encounter (Signed)
Called patient after voicemail received in reference to appointment with Dr. Lindi Adie.  Reports on 12-21-2015 she has an appointment later in the day on [facility]. so this appointment needs to be changed.  Call transferred to scheduler at  1615.

## 2015-12-13 ENCOUNTER — Ambulatory Visit (HOSPITAL_COMMUNITY)
Admission: RE | Admit: 2015-12-13 | Discharge: 2015-12-13 | Disposition: A | Discharge status: Home / Self Care | Payer: Medicare Other | Source: Ambulatory Visit | Attending: General Surgery | Admitting: General Surgery

## 2015-12-13 ENCOUNTER — Ambulatory Visit (HOSPITAL_COMMUNITY): Payer: Medicare Other | Admitting: Certified Registered Nurse Anesthetist

## 2015-12-13 ENCOUNTER — Encounter (HOSPITAL_COMMUNITY)
Admission: RE | Disposition: A | Discharge status: Home / Self Care | Payer: Self-pay | Source: Ambulatory Visit | Attending: General Surgery

## 2015-12-13 ENCOUNTER — Encounter (HOSPITAL_COMMUNITY): Payer: Self-pay | Admitting: Certified Registered Nurse Anesthetist

## 2015-12-13 DIAGNOSIS — Z79899 Other long term (current) drug therapy: Secondary | ICD-10-CM | POA: Insufficient documentation

## 2015-12-13 DIAGNOSIS — Z7901 Long term (current) use of anticoagulants: Secondary | ICD-10-CM | POA: Insufficient documentation

## 2015-12-13 DIAGNOSIS — C50412 Malignant neoplasm of upper-outer quadrant of left female breast: Secondary | ICD-10-CM | POA: Diagnosis not present

## 2015-12-13 DIAGNOSIS — Z170 Estrogen receptor positive status [ER+]: Secondary | ICD-10-CM | POA: Insufficient documentation

## 2015-12-13 DIAGNOSIS — Z86718 Personal history of other venous thrombosis and embolism: Secondary | ICD-10-CM | POA: Insufficient documentation

## 2015-12-13 DIAGNOSIS — I4891 Unspecified atrial fibrillation: Secondary | ICD-10-CM | POA: Insufficient documentation

## 2015-12-13 DIAGNOSIS — K219 Gastro-esophageal reflux disease without esophagitis: Secondary | ICD-10-CM | POA: Insufficient documentation

## 2015-12-13 DIAGNOSIS — Z86711 Personal history of pulmonary embolism: Secondary | ICD-10-CM | POA: Insufficient documentation

## 2015-12-13 DIAGNOSIS — M1990 Unspecified osteoarthritis, unspecified site: Secondary | ICD-10-CM | POA: Diagnosis not present

## 2015-12-13 HISTORY — PX: BREAST LUMPECTOMY WITH RADIOACTIVE SEED LOCALIZATION: SHX6424

## 2015-12-13 SURGERY — BREAST LUMPECTOMY WITH RADIOACTIVE SEED LOCALIZATION
Anesthesia: General | Site: Breast | Laterality: Left

## 2015-12-13 MED ORDER — 0.9 % SODIUM CHLORIDE (POUR BTL) OPTIME
TOPICAL | Status: DC | PRN
  Administered 2015-12-13: 1000 mL

## 2015-12-13 MED ORDER — MIDAZOLAM HCL 2 MG/2ML IJ SOLN: 0.5000 mg | Freq: Once | INTRAMUSCULAR | Status: DC | PRN

## 2015-12-13 MED ORDER — PROPOFOL 10 MG/ML IV BOLUS
INTRAVENOUS | Status: DC | PRN
  Administered 2015-12-13: 100 mg via INTRAVENOUS

## 2015-12-13 MED ORDER — TRAMADOL HCL 50 MG PO TABS: 50.0000 mg | ORAL_TABLET | Freq: Four times a day (QID) | ORAL | Status: DC | PRN

## 2015-12-13 MED ORDER — PROMETHAZINE HCL 25 MG/ML IJ SOLN
6.2500 mg | INTRAMUSCULAR | Status: DC | PRN
  Administered 2015-12-13: 6.25 mg via INTRAVENOUS

## 2015-12-13 MED ORDER — PROPOFOL 10 MG/ML IV BOLUS
INTRAVENOUS | Status: AC
Start: 2015-12-13 — End: 2015-12-13
  Filled 2015-12-13: qty 20

## 2015-12-13 MED ORDER — MEPERIDINE HCL 25 MG/ML IJ SOLN: 6.2500 mg | INTRAMUSCULAR | Status: DC | PRN

## 2015-12-13 MED ORDER — FENTANYL CITRATE (PF) 250 MCG/5ML IJ SOLN
INTRAMUSCULAR | Status: AC
  Filled 2015-12-13: qty 5

## 2015-12-13 MED ORDER — DEXAMETHASONE SODIUM PHOSPHATE 4 MG/ML IJ SOLN
INTRAMUSCULAR | Status: DC | PRN
  Administered 2015-12-13: 8 mg via INTRAVENOUS

## 2015-12-13 MED ORDER — EPHEDRINE SULFATE 50 MG/ML IJ SOLN
INTRAMUSCULAR | Status: DC | PRN
  Administered 2015-12-13 (×4): 5 mg via INTRAVENOUS
  Administered 2015-12-13: 10 mg via INTRAVENOUS

## 2015-12-13 MED ORDER — DEXAMETHASONE SODIUM PHOSPHATE 4 MG/ML IJ SOLN
INTRAMUSCULAR | Status: AC
  Filled 2015-12-13: qty 2

## 2015-12-13 MED ORDER — FENTANYL CITRATE (PF) 100 MCG/2ML IJ SOLN: 25.0000 ug | INTRAMUSCULAR | Status: DC | PRN

## 2015-12-13 MED ORDER — LIDOCAINE HCL (CARDIAC) 20 MG/ML IV SOLN
INTRAVENOUS | Status: AC
  Filled 2015-12-13: qty 5

## 2015-12-13 MED ORDER — ONDANSETRON HCL 4 MG/2ML IJ SOLN
INTRAMUSCULAR | Status: AC
  Filled 2015-12-13: qty 2

## 2015-12-13 MED ORDER — LACTATED RINGERS IV SOLN
INTRAVENOUS | Status: DC
  Administered 2015-12-13: 12:00:00 via INTRAVENOUS

## 2015-12-13 MED ORDER — ONDANSETRON HCL 4 MG/2ML IJ SOLN
INTRAMUSCULAR | Status: DC | PRN
  Administered 2015-12-13: 4 mg via INTRAVENOUS

## 2015-12-13 MED ORDER — FENTANYL CITRATE (PF) 250 MCG/5ML IJ SOLN
INTRAMUSCULAR | Status: DC | PRN
  Administered 2015-12-13 (×2): 50 ug via INTRAVENOUS
  Administered 2015-12-13: 25 ug via INTRAVENOUS

## 2015-12-13 MED ORDER — BUPIVACAINE-EPINEPHRINE (PF) 0.25% -1:200000 IJ SOLN
INTRAMUSCULAR | Status: AC
  Filled 2015-12-13: qty 30

## 2015-12-13 MED ORDER — BUPIVACAINE-EPINEPHRINE 0.25% -1:200000 IJ SOLN
INTRAMUSCULAR | Status: DC | PRN
  Administered 2015-12-13: 30 mL

## 2015-12-13 MED ORDER — PROMETHAZINE HCL 25 MG/ML IJ SOLN
INTRAMUSCULAR | Status: AC
  Filled 2015-12-13: qty 1

## 2015-12-13 MED ORDER — LIDOCAINE HCL (CARDIAC) 20 MG/ML IV SOLN
INTRAVENOUS | Status: DC | PRN
  Administered 2015-12-13: 50 mg via INTRAVENOUS

## 2015-12-13 SURGICAL SUPPLY — 44 items
APPLIER CLIP 9.375 MED OPEN (MISCELLANEOUS)
APR CLP MED 9.3 20 MLT OPN (MISCELLANEOUS)
BINDER BREAST LRG (GAUZE/BANDAGES/DRESSINGS) ×2 IMPLANT
BINDER BREAST XLRG (GAUZE/BANDAGES/DRESSINGS) IMPLANT
BLADE SURG 15 STRL LF DISP TIS (BLADE) ×1 IMPLANT
BLADE SURG 15 STRL SS (BLADE) ×3
CANISTER SUCTION 2500CC (MISCELLANEOUS) IMPLANT
CHLORAPREP W/TINT 26ML (MISCELLANEOUS) ×3 IMPLANT
CLIP APPLIE 9.375 MED OPEN (MISCELLANEOUS) IMPLANT
COVER PROBE W GEL 5X96 (DRAPES) ×3 IMPLANT
COVER SURGICAL LIGHT HANDLE (MISCELLANEOUS) ×3 IMPLANT
DEVICE DUBIN SPECIMEN MAMMOGRA (MISCELLANEOUS) ×3 IMPLANT
DRAPE CHEST BREAST 15X10 FENES (DRAPES) ×3 IMPLANT
DRAPE UTILITY W/TAPE 26X15 (DRAPES) ×3 IMPLANT
DRAPE UTILITY XL STRL (DRAPES) ×2 IMPLANT
ELECT CAUTERY BLADE 6.4 (BLADE) ×1 IMPLANT
ELECT COATED BLADE 2.86 ST (ELECTRODE) ×4 IMPLANT
ELECT REM PT RETURN 9FT ADLT (ELECTROSURGICAL) ×3
ELECTRODE REM PT RTRN 9FT ADLT (ELECTROSURGICAL) ×1 IMPLANT
GLOVE BIOGEL PI IND STRL 8 (GLOVE) ×1 IMPLANT
GLOVE BIOGEL PI INDICATOR 8 (GLOVE) ×2
GLOVE ECLIPSE 7.5 STRL STRAW (GLOVE) ×5 IMPLANT
GOWN STRL REUS W/ TWL LRG LVL3 (GOWN DISPOSABLE) ×1 IMPLANT
GOWN STRL REUS W/ TWL XL LVL3 (GOWN DISPOSABLE) ×1 IMPLANT
GOWN STRL REUS W/TWL LRG LVL3 (GOWN DISPOSABLE) ×3
GOWN STRL REUS W/TWL XL LVL3 (GOWN DISPOSABLE) ×3
KIT BASIN OR (CUSTOM PROCEDURE TRAY) ×3 IMPLANT
KIT MARKER MARGIN INK (KITS) ×3 IMPLANT
LIQUID BAND (GAUZE/BANDAGES/DRESSINGS) ×2 IMPLANT
NDL HYPO 25X1 1.5 SAFETY (NEEDLE) ×1 IMPLANT
NEEDLE HYPO 25X1 1.5 SAFETY (NEEDLE) ×3 IMPLANT
NS IRRIG 1000ML POUR BTL (IV SOLUTION) IMPLANT
PACK SURGICAL SETUP 50X90 (CUSTOM PROCEDURE TRAY) ×3 IMPLANT
PENCIL BUTTON HOLSTER BLD 10FT (ELECTRODE) ×3 IMPLANT
SPONGE LAP 18X18 X RAY DECT (DISPOSABLE) ×3 IMPLANT
SUT MON AB 5-0 PS2 18 (SUTURE) ×3 IMPLANT
SUT VIC AB 3-0 SH 18 (SUTURE) ×3 IMPLANT
SYR BULB 3OZ (MISCELLANEOUS) ×3 IMPLANT
SYR CONTROL 10ML LL (SYRINGE) ×3 IMPLANT
TOWEL OR 17X24 6PK STRL BLUE (TOWEL DISPOSABLE) ×3 IMPLANT
TOWEL OR 17X26 10 PK STRL BLUE (TOWEL DISPOSABLE) ×3 IMPLANT
TUBE CONNECTING 12'X1/4 (SUCTIONS)
TUBE CONNECTING 12X1/4 (SUCTIONS) IMPLANT
YANKAUER SUCT BULB TIP NO VENT (SUCTIONS) IMPLANT

## 2015-12-13 NOTE — Op Note (Signed)
Preoperative Diagnosis: cancer upper outer left breast  Postoprative Diagnosis: same  Procedure: Procedure(s): BREAST LUMPECTOMY WITH RADIOACTIVE SEED LOCALIZATION   Surgeon: Excell Seltzer T   Assistants: None  Anesthesia:  General LMA anesthesia  Indications: patient has a recent diagnosis of a T1 1 invasive ductal carcinoma of the upper outer left breast, less than 1 cm on ultrasound. After preoperative workup and discussion detailed elsewhere we have elected to proceed with radioactive seed localized lumpectomy as initial surgical treatment. The patient previously had a radioactive seed placed in the upper outer left breast. Post placement film showed the seed and the mass very close together with the original biopsy clip just over a centimeter medial and deep to the tumor in the seed.    Procedure Detail: seed placement was confirmed with the neoprobe in the holding area. Patient was taken to the operating room, placed in supine position on the operating table and laryngeal mask general anesthesia induced. She received preoperative IV antibiotics. The left breast was widely sterilely prepped and draped. PAS were in place. Patient timeout was performed and correct procedure verified. The neoprobe was used to localize the seed in the lateral left breast. A curvilinear incision was made directly over this and dissection carried down through the simultaneous tissue to the breast capsule. Using the neoprobe for guidance I excised a generous specimen of breast tissue around the area of high counts. Examination of the specimen with the neoprobe showed the seasonal little more on the medial side of the specimen. The specimen was inked for margins and specimen mammogram confirmed the seed within the specimen but again a little on the medial aspect and the marking clip was not visible on the specimen. I then excised a further 1 cm specimen of tissue medial to the lumpectomy which was inked for  margins and specimen radiograph showed the marking clip within the specimen. The soft tissue was infiltrated with Marcaine. Complete hemostasis was obtained with cautery. The breast is simultaneous tissue was closed with interrupted 3-0 Vicryl and the skin with subcuticular 5-0 Monocryl and Dermabond. Sponge needle and instrument counts were correct.    Findings: As above  Estimated Blood Loss:  Minimal         Drains: none  Blood Given: none          Specimens: left breast lumpectomy        Complications:  * No complications entered in OR log *         Disposition: PACU - hemodynamically stable.         Condition: stable

## 2015-12-13 NOTE — Anesthesia Postprocedure Evaluation (Signed)
Anesthesia Post Note  Patient: Hailey Graham  Procedure(s) Performed: Procedure(s) (LRB): BREAST LUMPECTOMY WITH RADIOACTIVE SEED LOCALIZATION (Left)  Patient location during evaluation: PACU Anesthesia Type: General Level of consciousness: awake and alert, oriented and patient cooperative Pain management: pain level controlled Vital Signs Assessment: post-procedure vital signs reviewed and stable Respiratory status: spontaneous breathing, nonlabored ventilation and respiratory function stable Cardiovascular status: blood pressure returned to baseline and stable Postop Assessment: no signs of nausea or vomiting Anesthetic complications: no    Last Vitals:  Filed Vitals:   12/13/15 1441 12/13/15 1450  BP:  127/82  Pulse:  53  Temp: 36.3 C   Resp: 21 20    Last Pain:  Filed Vitals:   12/13/15 1450  PainSc: 0-No pain                 Kalayna Noy,E. Bernd Crom

## 2015-12-13 NOTE — Transfer of Care (Signed)
Immediate Anesthesia Transfer of Care Note  Patient: Hailey Graham  Procedure(s) Performed: Procedure(s): BREAST LUMPECTOMY WITH RADIOACTIVE SEED LOCALIZATION (Left)  Patient Location: PACU  Anesthesia Type:General  Level of Consciousness: awake, alert , oriented and patient cooperative  Airway & Oxygen Therapy: Patient Spontanous Breathing and Patient connected to nasal cannula oxygen  Post-op Assessment: Report given to RN, Post -op Vital signs reviewed and stable and Patient moving all extremities X 4  Post vital signs: Reviewed and stable  Last Vitals:  Filed Vitals:   12/13/15 1044  BP: 140/64  Pulse: 89  Temp: [id number] C    Complications: No apparent anesthesia complications

## 2015-12-13 NOTE — H&P (Signed)
Hailey Graham 11/29/2015 2:45 PM Location: [facility] Patient #: [id number] DOB: [birth year] Undefined / Language: Hailey Graham / Race: White Female   History of Present Illness Hailey Graham T. Hailey Hover MD; 11/29/2015 2:53 PM) The patient is a 81 year old female who presents with breast cancer. Patint is an 81 year old post menopausal female referred by Dr. Elige Graham for evaluation of recently diagnosed carcinoma of the left breast. She recently presented for a screening mamogram revealing a new 6 mm asymmetry in the upper outer quadrant posterior depth left breast.. Subsequent imaging included diagnostic mamogram onfirming a small mass and ultrasound showing a 6 mm hypoechoic mass. An ultrasound guided breast biopsy was performed on 11/21/2015 with pathology revealing invasive ductal carcinoma of the breast associated with DCIS and focally involving a papilloma. She is seen now in [facility] for initial treatment planning. She has experienced no breast eymptoms, specifically lump, pain, nipple discharge or skin changes. She does not have a personal history of any previous breast problems.  Findings at that time were the following: Tumor size: 0.6 cm Tumor grade: 1 Estrogen Receptor: pending Progesterone Receptor: pending Her-2 neu: negative Lymph node status: nnegative    Other Problems Hailey Jolly, MD; 11/29/2015 2:47 PM) Pulmonary Embolism / Blood Clot in Legs Oophorectomy Bilateral. Umbilical Hernia Repair General anesthesia - complications Gastroesophageal Reflux Disease Lump In Breast Kidney Stone Diverticulosis Atrial Fibrillation Arthritis Breast Cancer Back Pain  Past Surgical History Hailey Jolly, MD; 11/29/2015 2:47 PM) Colon Polyp Removal - Colonoscopy Cataract Surgery Right. Hemorrhoidectomy Foot Surgery Right. Breast Biopsy Left. Appendectomy Hysterectomy (not due to cancer) - Complete Tonsillectomy Oral Surgery Ventral  / Umbilical Hernia Surgery Bilateral. multiple  Current Facility-Administered Medications  Medication Dose Route Frequency Provider Last Rate Last Dose  . ceFAZolin (ANCEF) IVPB 2 g/50 mL premix  2 g Intravenous To SS-Surg Hailey Seltzer, MD      . chlorhexidine (HIBICLENS) 4 % liquid 1 application  1 application Topical Once Hailey Seltzer, MD      . chlorhexidine (HIBICLENS) 4 % liquid 1 application  1 application Topical Once Hailey Seltzer, MD       Current Outpatient Prescriptions  Medication Sig Dispense Refill  . apraclonidine (IOPIDINE) 0.5 % ophthalmic solution Place 1 drop into the right eye 2 (two) times daily.     . bimatoprost (LUMIGAN) 0.01 % SOLN Place 1 drop into the right eye at bedtime.     . Cholecalciferol (VITAMIN D) 2000 UNITS tablet Take 2,000 Units by mouth daily.    Hailey Graham docusate sodium (COLACE) 250 MG capsule Take 250 mg by mouth daily.    . dorzolamide-timolol (COSOPT) 22.3-6.8 MG/ML ophthalmic solution Place 1 drop into the right eye 2 (two) times daily.  10  . ezetimibe-simvastatin (VYTORIN) 10-20 MG per tablet Take 1 tablet by mouth at bedtime. 30 tablet 6  . metoprolol succinate (TOPROL-XL) 25 MG 24 hr tablet Take 12.5 mg by mouth daily.   11  . pantoprazole (PROTONIX) 40 MG tablet Take 40 mg by mouth daily as needed.    . Rivaroxaban (XARELTO) 20 MG TABS Take 1 tablet (20 mg total) by mouth daily. 30 tablet 30  . ALPRAZolam (XANAX) 0.5 MG tablet Take 0.25 mg by mouth 3 (three) times daily as needed for anxiety. Order is 0.05 tid prn      Diagnostic Studies History Hailey Jolly, MD; 11/29/2015 2:47 PM) Pap Smear >5 years ago Colonoscopy >10 years ago  Social History Hailey Jolly, MD;  11/29/2015 2:47 PM) No alcohol use No caffeine use Tobacco use Never smoker. No drug use  Family History Hailey Jolly, MD; 11/29/2015 2:47 PM) Arthritis Father, Mother.  Pregnancy / Birth History Hailey Jolly, MD; 11/29/2015 2:47  PM) Hailey Graham 1 Contraceptive History Oral contraceptives. Para 0 Irregular periods Graham of menopause 22-50 Graham at menarche 70 years.    Physical Exam Hailey Graham T. Hailey Mennella MD; 11/29/2015 2:54 PM) The physical exam findings are as follows: Note:General: Alert, well-developed and vigorous appearing elderly Caucasian female, in no distress Skin: Warm and dry without rash or infection. HEENT: No palpable masses or thyromegaly. Sclera nonicteric. Pupils equal round and reactive. Lymph nodes: No cervical, supraclavicular, or inguinal nodes palpable. Breasts: No palpable masses in either breast. No skin changes or nipple inversion. No palpable axillary adenopathy. Lungs: Breath sounds clear and equal. No wheezing or increased work of breathing. Cardiovascular: Regular rate and rhythm without murmer. 1+ lower extremity edema. Abdomen: Nondistended. Soft and nontender. No masses palpable. No organomegaly. No palpable hernias. Extremities: 1+ lower extremity edema or joint swelling or deformity. No chronic venous stasis changes. Neurologic: Alert and fully oriented. Gait normal. No focal weakness. Psychiatric: Normal mood and affect. Thought content appropriate with normal judgement and insight    Assessment & Plan Hailey Graham T. Hailey Mcguffee MD; 11/29/2015 3:00 PM) BREAST CANCER OF UPPER-OUTER QUADRANT OF LEFT FEMALE BREAST (C50.412) Impression: 81 year old female with a new diagnosis of cancer of the left breast, upper outer quadrant. Clinical stage 1 a, ER pending, PR pending, HER-2 negative. I discussed with the patient and family members present today initial surgical treatment options. We discussed options of breast conservation with lumpectomy or total mastectomy and sentinal lymph node biopsy/dissection. the consensus of the handle today is that she would not require lymph node biopsy at her Graham and very early clinical stage. After discussion they have elected to proceed with lumpectomy. We  discussed the indications and nature of the procedure, and expected recovery, in detail. Surgical risks including anesthetic complications, cardiorespiratory complications, bleeding, infection, wound healing complications, blood clots, lymphedema, local and distant recurrence and possible need for further surgery based on the final pathology was discussed and understood. Chemotherapy, hormonal therapy and radiation therapy have been discussed. They have been provided with literature regarding the treatment of breast cancer. All questions were answered. They understand and agree to proceed and we will go ahead with scheduling. She wil surgery which she did for her biopsy.ays prior to surgery which she did for her biopsy. We will obtain cardiac clearance. Current Plans Schedule for Surgery radioactive seed localized left breast lumpectomy  Pt Education - [facility] Breast Biopsy HCI: discussed with patient and provided information.

## 2015-12-13 NOTE — Discharge Instructions (Signed)
[facility] °Office Phone Number [phone] ° °BREAST BIOPSY/ PARTIAL MASTECTOMY: POST OP INSTRUCTIONS ° °Always review your discharge instruction sheet given to you by the facility where your surgery was performed. ° °IF YOU HAVE DISABILITY OR FAMILY LEAVE FORMS, YOU MUST BRING THEM TO THE OFFICE FOR PROCESSING.  DO NOT GIVE THEM TO YOUR DOCTOR. ° °1. A prescription for pain medication may be given to you upon discharge.  Take your pain medication as prescribed, if needed.  If narcotic pain medicine is not needed, then you may take acetaminophen (Tylenol) or ibuprofen (Advil) as needed. °2. Take your usually prescribed medications unless otherwise directed °3. If you need a refill on your pain medication, please contact your pharmacy.  They will contact our office to request authorization.  Prescriptions will not be filled after 5pm or on week-ends. °4. You should eat very light the first 24 hours after surgery, such as soup, crackers, pudding, etc.  Resume your normal diet the day after surgery. °5. Most patients will experience some swelling and bruising in the breast.  Ice packs and a good support bra will help.  Swelling and bruising can take several days to resolve.  °6. It is common to experience some constipation if taking pain medication after surgery.  Increasing fluid intake and taking a stool softener will usually help or prevent this problem from occurring.  A mild laxative (Milk of Magnesia or Miralax) should be taken according to package directions if there are no bowel movements after 48 hours. °7. Unless discharge instructions indicate otherwise, you may remove your bandages 24-48 hours after surgery, and you may shower at that time.  You may have steri-strips (small skin tapes) in place directly over the incision.  These strips should be left on the skin for 7-10 days.  If your surgeon used skin glue on the incision, you may shower in 24 hours.  The glue will flake off over the  next 2-3 weeks.  Any sutures or staples will be removed at the office during your follow-up visit. °8. ACTIVITIES:  You may resume regular daily activities (gradually increasing) beginning the next day.  Wearing a good support bra or sports bra minimizes pain and swelling.  You may have sexual intercourse when it is comfortable. °a. You may drive when you no longer are taking prescription pain medication, you can comfortably wear a seatbelt, and you can safely maneuver your car and apply brakes. °b. RETURN TO WORK:  ______________________________________________________________________________________ °9. You should see your doctor in the office for a follow-up appointment approximately two weeks after your surgery.  Your doctor’s nurse will typically make your follow-up appointment when she calls you with your pathology report.  Expect your pathology report 2-3 business days after your surgery.  You may call to check if you do not hear from us after three days. °10. OTHER INSTRUCTIONS: _______________________________________________________________________________________________ _____________________________________________________________________________________________________________________________________ °_____________________________________________________________________________________________________________________________________ °_____________________________________________________________________________________________________________________________________ ° °WHEN TO CALL YOUR DOCTOR: °1. Fever over 101.0 °2. Nausea and/or vomiting. °3. Extreme swelling or bruising. °4. Continued bleeding from incision. °5. Increased pain, redness, or drainage from the incision. ° °The clinic staff is available to answer your questions during regular business hours.  Please don’t hesitate to call and ask to speak to one of the nurses for clinical concerns.  If you have a medical emergency, go to the nearest  emergency room or call 911.  A surgeon from [facility] is always on call at the hospital. ° °For further questions, please visit [email or web address]  °

## 2015-12-13 NOTE — OR Nursing (Signed)
Pt arrived to PACU s/p lt breast lumpectomy- incision closed with liquid skin adhesive, covered with gauze and breast binder intact. Incision clean, dry, intact.

## 2015-12-13 NOTE — Anesthesia Preprocedure Evaluation (Addendum)
Anesthesia Evaluation  Patient identified by MRN, date of birth, ID band Patient awake    Reviewed: Allergy & Precautions, NPO status , Patient's Chart, lab work & pertinent test results  History of Anesthesia Complications (+) PONV and history of anesthetic complications  Airway Mallampati: II  TM Distance: >3 FB Neck ROM: Full    Dental  (+) Dental Advisory Given, Partial Upper   Pulmonary neg pulmonary ROS,    breath sounds clear to auscultation       Cardiovascular + dysrhythmias Atrial Fibrillation  Rhythm:Regular Rate:Normal  '09 ECHO: EF 65%, valves OK   Neuro/Psych negative neurological ROS     GI/Hepatic Neg liver ROS,   Endo/Other  negative endocrine ROS  Renal/GU negative Renal ROS     Musculoskeletal  (+) Arthritis , Osteoarthritis,    Abdominal   Peds  Hematology  (+) Blood dyscrasia (Xarelto: off since friday), ,   Anesthesia Other Findings   Reproductive/Obstetrics                            Anesthesia Physical Anesthesia Plan  ASA: III  Anesthesia Plan: General   Post-op Pain Management:    Induction: Intravenous  Airway Management Planned: LMA  Additional Equipment:   Intra-op Plan:   Post-operative Plan:   Informed Consent: I have reviewed the patients History and Physical, chart, labs and discussed the procedure including the risks, benefits and alternatives for the proposed anesthesia with the patient or authorized representative who has indicated his/her understanding and acceptance.   Dental advisory given  Plan Discussed with: CRNA and Surgeon  Anesthesia Plan Comments: (Plan routine monitors, GA- LMA OK)        Anesthesia Quick Evaluation

## 2015-12-13 NOTE — Anesthesia Procedure Notes (Signed)
Procedure Name: LMA Insertion Date/Time: 12/13/2015 12:36 PM Performed by: Rogers Blocker Pre-anesthesia Checklist: Patient identified, Emergency Drugs available, Suction available, Patient being monitored and Timeout performed Patient Re-evaluated:Patient Re-evaluated prior to inductionOxygen Delivery Method: Circle system utilized Preoxygenation: Pre-oxygenation with 100% oxygen Intubation Type: IV induction LMA: LMA inserted LMA Size: 4.0 Dental Injury: Teeth and Oropharynx as per pre-operative assessment

## 2015-12-13 NOTE — Interval H&P Note (Signed)
History and Physical Interval Note:  12/13/2015 12:34 PM  Hailey Graham  has presented today for surgery, with the diagnosis of left breast cancer  The various methods of treatment have been discussed with the patient and family. After consideration of risks, benefits and other options for treatment, the patient has consented to  Procedure(s): BREAST LUMPECTOMY WITH RADIOACTIVE SEED LOCALIZATION (Left) as a surgical intervention .  The patient's history has been reviewed, patient examined, no change in status, stable for surgery.  I have reviewed the patient's chart and labs.  Questions were answered to the patient's satisfaction.     Scott Vanderveer T

## 2015-12-14 ENCOUNTER — Encounter (HOSPITAL_COMMUNITY): Payer: Self-pay | Admitting: General Surgery

## 2015-12-21 ENCOUNTER — Telehealth: Payer: Self-pay | Admitting: Hematology and Oncology

## 2015-12-21 ENCOUNTER — Ambulatory Visit (HOSPITAL_BASED_OUTPATIENT_CLINIC_OR_DEPARTMENT_OTHER): Payer: Medicare Other | Admitting: Hematology and Oncology

## 2015-12-21 ENCOUNTER — Encounter: Payer: Self-pay | Admitting: Hematology and Oncology

## 2015-12-21 VITALS — BP 134/74 | HR 65 | Temp 97.5°F | Resp 17 | Ht 67.0 in | Wt 181.6 lb

## 2015-12-21 DIAGNOSIS — C50412 Malignant neoplasm of upper-outer quadrant of left female breast: Secondary | ICD-10-CM | POA: Diagnosis not present

## 2015-12-21 DIAGNOSIS — M810 Age-related osteoporosis without current pathological fracture: Secondary | ICD-10-CM

## 2015-12-21 MED ORDER — TAMOXIFEN CITRATE 20 MG PO TABS: 20.0000 mg | ORAL_TABLET | Freq: Every day | ORAL | Status: DC

## 2015-12-21 NOTE — Assessment & Plan Note (Signed)
Left lumpectomy 12/13/2015: Invasive ductal carcinoma 0.4 cm, DCIS 1.6 cm, clear margins, ER 100%, PR 100%, HER-2 negative ratio 1.22, Ki-67 5% T1 N0 stage IA  Pathology review: Discuss the final pathology report and provided her with a copy of this report. I discussed in tumor size as well as a final staging. We also discussed the ER/PR HER-2 receptors.  Recommendation: 1. Based on the small size of the invasive breast cancer, I felt that her risk of breast cancer recurrence is fairly small. If she wanted to she could receive antiestrogen therapy with anastrozole. It is also perfectly reasonable to not pursue any further antiestrogen therapy. 2. I anticipate her risk of relapse in the next 5 years would be around 10-12% and antiestrogen therapy would decrease this down to 5%.

## 2015-12-21 NOTE — Telephone Encounter (Signed)
Appointments made and avs printed for patient °

## 2015-12-21 NOTE — Progress Notes (Signed)
Patient Care Team: Sinda Du, MD as PCP - General (Internal Medicine) Rogene Houston, MD (Gastroenterology) Excell Seltzer, MD as Consulting Physician (General Surgery) Nicholas Lose, MD as Consulting Physician (Hematology and Oncology) Arloa Koh, MD as Consulting Physician (Radiation Oncology) Sylvan Cheese, NP as Nurse Practitioner (Hematology and Oncology)  DIAGNOSIS: Breast cancer of upper-outer quadrant of left female breast [facility])   Staging form: Breast, AJCC 7th Edition     Clinical stage from 11/29/2015: Stage IA (T1b, N0, M0) - Unsigned   SUMMARY OF ONCOLOGIC HISTORY:   Breast cancer of upper-outer quadrant of left female breast ([place])   11/02/2015 Mammogram screening mammogram category B breast density, asymmetry with indistinct margin in the left breast upper outer quadrant posterior depth, 2 internal mammary lymph nodes, 6 mm at 2:00 position axillary negative   11/21/2015 Initial Diagnosis left breast biopsy: Invasive ductal carcinoma with associated low-grade DCIS with microcalcifications focally involving a papilloma, ER/PR 100%, HER-2 negative ratio 1.22   12/13/2015 Surgery Left lumpectomy: Invasive ductal carcinoma 0.4 cm, DCIS 1.6 cm, clear margins, ER 100%, PR 100%, HER-2 negative ratio 1.22, Ki-67 5% T1 N0 stage IA    CHIEF COMPLIANT: Follow-up after recent lumpectomy  INTERVAL HISTORY: Hailey Graham is a 81 year old with above-mentioned history of left breast cancer underwent lumpectomy on 12/13/2015. She has done extremely well from the surgery standpoint. She does not have any pain or discomfort. She is healing very well.  REVIEW OF SYSTEMS:   Constitutional: Denies fevers, chills or abnormal weight loss Eyes: Denies blurriness of vision Ears, nose, mouth, throat, and face: Denies mucositis or sore throat Respiratory: Denies cough, dyspnea or wheezes Cardiovascular: Denies palpitation, chest discomfort Gastrointestinal:  Denies nausea,  heartburn or change in bowel habits Skin: Denies abnormal skin rashes Lymphatics: Denies new lymphadenopathy or easy bruising Neurological:Denies numbness, tingling or new weaknesses Behavioral/Psych: Mood is stable, no new changes  Extremities: No lower extremity edema Breast:  Denies any discomfort from recent surgery All other systems were reviewed with the patient and are negative.  I have reviewed the past medical history, past surgical history, social history and family history with the patient and they are unchanged from previous note.  ALLERGIES:  is allergic to brimonidine; codeine; morphine; and sulfa drugs cross reactors.  MEDICATIONS:  Current Outpatient Prescriptions  Medication Sig Dispense Refill  . ALPRAZolam (XANAX) 0.5 MG tablet Take 0.25 mg by mouth 3 (three) times daily as needed for anxiety. Order is 0.05 tid prn    . apraclonidine (IOPIDINE) 0.5 % ophthalmic solution Place 1 drop into the right eye 2 (two) times daily.     . bimatoprost (LUMIGAN) 0.01 % SOLN Place 1 drop into the right eye at bedtime.     . Cholecalciferol (VITAMIN D) 2000 UNITS tablet Take 2,000 Units by mouth daily.    Marland Kitchen docusate sodium (COLACE) 250 MG capsule Take 250 mg by mouth daily.    . dorzolamide-timolol (COSOPT) 22.3-6.8 MG/ML ophthalmic solution Place 1 drop into the right eye 2 (two) times daily.  10  . ezetimibe-simvastatin (VYTORIN) 10-20 MG per tablet Take 1 tablet by mouth at bedtime. 30 tablet 6  . metoprolol succinate (TOPROL-XL) 25 MG 24 hr tablet Take 12.5 mg by mouth daily.   11  . pantoprazole (PROTONIX) 40 MG tablet Take 40 mg by mouth daily as needed.    . Rivaroxaban (XARELTO) 20 MG TABS Take 1 tablet (20 mg total) by mouth daily. 30 tablet 30  . tamoxifen (NOLVADEX)  20 MG tablet Take 1 tablet (20 mg total) by mouth daily. 90 tablet 3  . traMADol (ULTRAM) 50 MG tablet Take 1-2 tablets (50-100 mg total) by mouth every 6 (six) hours as needed for moderate pain or severe pain.  20 tablet 0   No current facility-administered medications for this visit.    PHYSICAL EXAMINATION: ECOG PERFORMANCE STATUS: 0 - Asymptomatic  Filed Vitals:   12/21/15 1156  BP: 134/74  Pulse: 65  Temp: 97.5 F (36.4 C)  Resp: 17   Filed Weights   12/21/15 1156  Weight: 181 lb 9.6 oz (82.373 kg)    GENERAL:alert, no distress and comfortable SKIN: skin color, texture, turgor are normal, no rashes or significant lesions EYES: normal, Conjunctiva are pink and non-injected, sclera clear OROPHARYNX:no exudate, no erythema and lips, buccal mucosa, and tongue normal  NECK: supple, thyroid normal size, non-tender, without nodularity LYMPH:  no palpable lymphadenopathy in the cervical, axillary or inguinal LUNGS: clear to auscultation and percussion with normal breathing effort HEART: regular rate & rhythm and no murmurs and no lower extremity edema ABDOMEN:abdomen soft, non-tender and normal bowel sounds MUSCULOSKELETAL:no cyanosis of digits and no clubbing  NEURO: alert & oriented x 3 with fluent speech, no focal motor/sensory deficits EXTREMITIES: No lower extremity edema  LABORATORY DATA:  I have reviewed the data as listed   Chemistry      Component Value Date/Time   NA 142 12/12/2015 1137   NA 142 11/29/2015 1243   K 4.3 12/12/2015 1137   K 3.9 11/29/2015 1243   CL 109 12/12/2015 1137   CO2 28 12/12/2015 1137   CO2 23 11/29/2015 1243   BUN 24* 12/12/2015 1137   BUN 21.9 11/29/2015 1243   CREATININE 0.81 12/12/2015 1137   CREATININE 0.8 11/29/2015 1243   CREATININE 0.78 09/06/2015 1339      Component Value Date/Time   CALCIUM 9.9 12/12/2015 1137   CALCIUM 9.9 11/29/2015 1243   ALKPHOS 79 11/29/2015 1243   ALKPHOS 70 03/19/2013 1439   AST 15 11/29/2015 1243   AST 17 03/19/2013 1439   ALT 12 11/29/2015 1243   ALT 20 03/19/2013 1439   BILITOT 0.43 11/29/2015 1243   BILITOT 0.3 03/19/2013 1439       Lab Results  Component Value Date   WBC 6.4 12/12/2015    HGB 14.2 12/12/2015   HCT 44.1 12/12/2015   MCV 90.9 12/12/2015   PLT 215 12/12/2015   NEUTROABS 3.3 11/29/2015     ASSESSMENT & PLAN:  Breast cancer of upper-outer quadrant of left female breast ([place]) Left lumpectomy 12/13/2015: Invasive ductal carcinoma 0.4 cm, DCIS 1.6 cm, clear margins, ER 100%, PR 100%, HER-2 negative ratio 1.22, Ki-67 5% T1 N0 stage IA  Pathology review: Discuss the final pathology report and provided her with a copy of this report. I discussed in tumor size as well as a final staging. We also discussed the ER/PR HER-2 receptors.  Recommendation: 1. Based on the small size of the invasive breast cancer, I felt that her risk of breast cancer recurrence is fairly small. We discussed the risks and benefits of antiestrogen therapy. Based upon her severe osteoporosis (T score -3 in 2015), I recommended tamoxifen 20 mg daily 5 years. I also offered her observation alone as another option. But patient wanted to try tamoxifen. If she could not tolerate it then she plans to discontinue it. We discussed risks and benefits of tamoxifen including the risk of hot flashes, myalgias, risk of  blood clots. Because of patient is on xarelto, her risk of blood clot is already minimized.  2. I anticipate her risk of relapse in the next 5 years would be around 10-12% and antiestrogen therapy would decrease this down to 5%.   Return to clinic in 3 months for follow-up on tamoxifen. Patient will start tamoxifen around mid-January.   No orders of the defined types were placed in this encounter.   The patient has a good understanding of the overall plan. she agrees with it. she will call with any problems that may develop before the next visit here.   Rulon Eisenmenger, MD 12/21/2015

## 2015-12-22 ENCOUNTER — Encounter: Payer: Self-pay | Admitting: *Deleted

## 2015-12-22 DIAGNOSIS — C50412 Malignant neoplasm of upper-outer quadrant of left female breast: Secondary | ICD-10-CM

## 2015-12-23 ENCOUNTER — Telehealth: Payer: Self-pay | Admitting: Hematology and Oncology

## 2015-12-23 NOTE — Telephone Encounter (Signed)
Spoke with patient and she request to see dr Lindi Adie in march before scheduling survivorship

## 2016-01-22 ENCOUNTER — Telehealth: Payer: Self-pay | Admitting: *Deleted

## 2016-01-22 NOTE — Telephone Encounter (Signed)
Dr Lindi Adie recommended 1/2 dose or changing time of taking med.  If symptoms continue, pt to stop tamoxifen and call clinic.  Pt voiced understanding.

## 2016-01-22 NOTE — Telephone Encounter (Signed)
Patient called stating that she started taking her Tamoxifen Wednesday every since then she is experiencing off and on dizziness. This dizziness mainly occur in the morning after taking her medication that night. Message sent to MD Gudena/ RN Terri.

## 2016-02-01 ENCOUNTER — Ambulatory Visit (INDEPENDENT_AMBULATORY_CARE_PROVIDER_SITE_OTHER): Payer: Medicare Other | Admitting: Otolaryngology

## 2016-02-01 DIAGNOSIS — H903 Sensorineural hearing loss, bilateral: Secondary | ICD-10-CM | POA: Diagnosis not present

## 2016-02-01 DIAGNOSIS — R42 Dizziness and giddiness: Secondary | ICD-10-CM

## 2016-02-12 DIAGNOSIS — H401411 Capsular glaucoma with pseudoexfoliation of lens, right eye, mild stage: Secondary | ICD-10-CM | POA: Diagnosis not present

## 2016-02-12 DIAGNOSIS — H401423 Capsular glaucoma with pseudoexfoliation of lens, left eye, severe stage: Secondary | ICD-10-CM | POA: Diagnosis not present

## 2016-02-12 NOTE — Telephone Encounter (Signed)
err

## 2016-02-23 ENCOUNTER — Encounter (HOSPITAL_COMMUNITY): Payer: Self-pay

## 2016-03-18 ENCOUNTER — Telehealth: Payer: Self-pay

## 2016-03-18 NOTE — Telephone Encounter (Signed)
Returned pt call re: appt Wed.  Let pt know she should dress planning she may need to change to a gown.  Pt voiced understanding.

## 2016-03-19 NOTE — Assessment & Plan Note (Signed)
Left lumpectomy 12/13/2015: Invasive ductal carcinoma 0.4 cm, DCIS 1.6 cm, clear margins, ER 100%, PR 100%, HER-2 negative ratio 1.22, Ki-67 5% T1 N0 stage IA  Recommendation: 1. Based on the small size of the invasive breast cancer, I felt that her risk of breast cancer recurrence is fairly small. We discussed the risks and benefits of antiestrogen therapy. Based upon her severe osteoporosis (T score -3 in 2015), I recommended tamoxifen 20 mg daily 5 years.  2. I anticipate her risk of relapse in the next 5 years would be around 10-12% and antiestrogen therapy would decrease this down to 5%.  Return to clinic in 6 months for follow-up

## 2016-03-20 ENCOUNTER — Ambulatory Visit (HOSPITAL_BASED_OUTPATIENT_CLINIC_OR_DEPARTMENT_OTHER): Payer: Medicare Other | Admitting: Hematology and Oncology

## 2016-03-20 ENCOUNTER — Telehealth: Payer: Self-pay | Admitting: Hematology and Oncology

## 2016-03-20 ENCOUNTER — Encounter: Payer: Self-pay | Admitting: Hematology and Oncology

## 2016-03-20 VITALS — BP 134/84 | HR 59 | Temp 97.5°F | Resp 18 | Wt 179.1 lb

## 2016-03-20 DIAGNOSIS — R61 Generalized hyperhidrosis: Secondary | ICD-10-CM | POA: Diagnosis not present

## 2016-03-20 DIAGNOSIS — C50412 Malignant neoplasm of upper-outer quadrant of left female breast: Secondary | ICD-10-CM

## 2016-03-20 DIAGNOSIS — N951 Menopausal and female climacteric states: Secondary | ICD-10-CM | POA: Diagnosis not present

## 2016-03-20 NOTE — Progress Notes (Signed)
Patient Care Team: Sinda Du, MD as PCP - General (Internal Medicine) Rogene Houston, MD (Gastroenterology) Excell Seltzer, MD as Consulting Physician (General Surgery) Nicholas Lose, MD as Consulting Physician (Hematology and Oncology) Arloa Koh, MD as Consulting Physician (Radiation Oncology) Sylvan Cheese, NP as Nurse Practitioner (Hematology and Oncology)  DIAGNOSIS: Breast cancer of upper-outer quadrant of left female breast [facility])   Staging form: Breast, AJCC 7th Edition     Clinical stage from 11/29/2015: Stage IA (T1b, N0, M0) - Unsigned  SUMMARY OF ONCOLOGIC HISTORY:   Breast cancer of upper-outer quadrant of left female breast ([place])   11/02/2015 Mammogram screening mammogram category B breast density, asymmetry with indistinct margin in the left breast upper outer quadrant posterior depth, 2 internal mammary lymph nodes, 6 mm at 2:00 position axillary negative   11/21/2015 Initial Diagnosis left breast biopsy: Invasive ductal carcinoma with associated low-grade DCIS with microcalcifications focally involving a papilloma, ER/PR 100%, HER-2 negative ratio 1.22   12/13/2015 Surgery Left lumpectomy: Invasive ductal carcinoma 0.4 cm, DCIS 1.6 cm, clear margins, ER 100%, PR 100%, HER-2 negative ratio 1.22, Ki-67 5% T1 N0 stage IA    CHIEF COMPLIANT: Follow-up on tamoxifen therapy  INTERVAL HISTORY: EARLY ORD is a 82 year old with above-mentioned history of left breast cancer who underwent lumpectomy and is here on tamoxifen therapy. She has not been able to tolerate tamoxifen. We decreased the dosage to 10 mg daily but even at that dose she continues to have trouble with hot flashes and night sweats. This has been limiting some of her and have daily physical activities. She says very busy taking care of her husband who is mostly at home because of health issues including chronic back problems.  REVIEW OF SYSTEMS:   Constitutional: Denies fevers, chills or  abnormal weight loss Eyes: Denies blurriness of vision Ears, nose, mouth, throat, and face: Denies mucositis or sore throat Respiratory: Denies cough, dyspnea or wheezes Cardiovascular: Denies palpitation, chest discomfort Gastrointestinal:  Denies nausea, heartburn or change in bowel habits Skin: Denies abnormal skin rashes Lymphatics: Denies new lymphadenopathy or easy bruising Neurological:Denies numbness, tingling or new weaknesses Behavioral/Psych: Mood is stable, no new changes  Extremities: No lower extremity edema  All other systems were reviewed with the patient and are negative.  I have reviewed the past medical history, past surgical history, social history and family history with the patient and they are unchanged from previous note.  ALLERGIES:  is allergic to brimonidine; codeine; morphine; and sulfa drugs cross reactors.  MEDICATIONS:  Current Outpatient Prescriptions  Medication Sig Dispense Refill  . ALPRAZolam (XANAX) 0.5 MG tablet Take 0.25 mg by mouth 3 (three) times daily as needed for anxiety. Order is 0.05 tid prn    . apraclonidine (IOPIDINE) 0.5 % ophthalmic solution Place 1 drop into the right eye 2 (two) times daily.     . bimatoprost (LUMIGAN) 0.01 % SOLN Place 1 drop into the right eye at bedtime.     . Cholecalciferol (VITAMIN D) 2000 UNITS tablet Take 2,000 Units by mouth daily.    Marland Kitchen docusate sodium (COLACE) 250 MG capsule Take 250 mg by mouth daily.    . dorzolamide-timolol (COSOPT) 22.3-6.8 MG/ML ophthalmic solution Place 1 drop into the right eye 2 (two) times daily.  10  . ezetimibe-simvastatin (VYTORIN) 10-20 MG per tablet Take 1 tablet by mouth at bedtime. 30 tablet 6  . metoprolol succinate (TOPROL-XL) 25 MG 24 hr tablet Take 12.5 mg by mouth daily.  11  . pantoprazole (PROTONIX) 40 MG tablet Take 40 mg by mouth daily as needed.    . Rivaroxaban (XARELTO) 20 MG TABS Take 1 tablet (20 mg total) by mouth daily. 30 tablet 30  . tamoxifen (NOLVADEX)  20 MG tablet Take 1 tablet (20 mg total) by mouth daily. 90 tablet 3  . traMADol (ULTRAM) 50 MG tablet Take 1-2 tablets (50-100 mg total) by mouth every 6 (six) hours as needed for moderate pain or severe pain. 20 tablet 0   No current facility-administered medications for this visit.    PHYSICAL EXAMINATION: ECOG PERFORMANCE STATUS: 1 - Symptomatic but completely ambulatory  Filed Vitals:   03/20/16 1116  BP: 134/84  Pulse: 59  Temp: 97.5 F (36.4 C)  Resp: 18   Filed Weights   03/20/16 1116  Weight: 179 lb 1.6 oz (81.239 kg)    GENERAL:alert, no distress and comfortable SKIN: skin color, texture, turgor are normal, no rashes or significant lesions EYES: normal, Conjunctiva are pink and non-injected, sclera clear OROPHARYNX:no exudate, no erythema and lips, buccal mucosa, and tongue normal  NECK: supple, thyroid normal size, non-tender, without nodularity LYMPH:  no palpable lymphadenopathy in the cervical, axillary or inguinal LUNGS: clear to auscultation and percussion with normal breathing effort HEART: regular rate & rhythm and no murmurs and no lower extremity edema ABDOMEN:abdomen soft, non-tender and normal bowel sounds MUSCULOSKELETAL:no cyanosis of digits and no clubbing  NEURO: alert & oriented x 3 with fluent speech, no focal motor/sensory deficits EXTREMITIES: No lower extremity edema  LABORATORY DATA:  I have reviewed the data as listed   Chemistry      Component Value Date/Time   NA 142 12/12/2015 1137   NA 142 11/29/2015 1243   K 4.3 12/12/2015 1137   K 3.9 11/29/2015 1243   CL 109 12/12/2015 1137   CO2 28 12/12/2015 1137   CO2 23 11/29/2015 1243   BUN 24* 12/12/2015 1137   BUN 21.9 11/29/2015 1243   CREATININE 0.81 12/12/2015 1137   CREATININE 0.8 11/29/2015 1243   CREATININE 0.78 09/06/2015 1339      Component Value Date/Time   CALCIUM 9.9 12/12/2015 1137   CALCIUM 9.9 11/29/2015 1243   ALKPHOS 79 11/29/2015 1243   ALKPHOS 70 03/19/2013 1439     AST 15 11/29/2015 1243   AST 17 03/19/2013 1439   ALT 12 11/29/2015 1243   ALT 20 03/19/2013 1439   BILITOT 0.43 11/29/2015 1243   BILITOT 0.3 03/19/2013 1439     Lab Results  Component Value Date   WBC 6.4 12/12/2015   HGB 14.2 12/12/2015   HCT 44.1 12/12/2015   MCV 90.9 12/12/2015   PLT 215 12/12/2015   NEUTROABS 3.3 11/29/2015   ASSESSMENT & PLAN:  Breast cancer of upper-outer quadrant of left female breast ([place]) Left lumpectomy 12/13/2015: Invasive ductal carcinoma 0.4 cm, DCIS 1.6 cm, clear margins, ER 100%, PR 100%, HER-2 negative ratio 1.22, Ki-67 5% T1 N0 stage IA Prognosis:I anticipate her risk of relapse in the next 5 years would be around 10-12% and antiestrogen therapy would decrease this down to 5%.  Tamoxifen started 12/21/2015 discontinued 03/20/2016  Tamoxifen toxicities: 1. Severe hot flashes 2. Severe night sweats These symptoms are persistent inspite of half dose of tamoxifen. After discussion with her, we decided that she does not need to continue with tamoxifen therapy any longer.  Return to clinic in 6 months for follow-up   No orders of the defined types were placed in  this encounter.   The patient has a good understanding of the overall plan. she agrees with it. she will call with any problems that may develop before the next visit here.   Rulon Eisenmenger, MD 03/20/2016

## 2016-03-20 NOTE — Progress Notes (Signed)
Unable to get in to exam room prior to MD.  No assessment performed.  

## 2016-03-20 NOTE — Telephone Encounter (Signed)
appt made and avs printed °

## 2016-03-25 ENCOUNTER — Ambulatory Visit: Payer: Medicare Other | Admitting: Neurology

## 2016-04-01 ENCOUNTER — Ambulatory Visit: Payer: Medicare Other | Admitting: Neurology

## 2016-05-08 ENCOUNTER — Encounter (INDEPENDENT_AMBULATORY_CARE_PROVIDER_SITE_OTHER): Payer: Self-pay | Admitting: Internal Medicine

## 2016-05-29 DIAGNOSIS — C50412 Malignant neoplasm of upper-outer quadrant of left female breast: Secondary | ICD-10-CM | POA: Diagnosis not present

## 2016-07-09 ENCOUNTER — Telehealth: Payer: Self-pay | Admitting: Cardiovascular Disease

## 2016-07-09 MED ORDER — RIVAROXABAN 20 MG PO TABS: 20.0000 mg | ORAL_TABLET | Freq: Every day | ORAL | Status: DC

## 2016-07-09 NOTE — Telephone Encounter (Signed)
Sent RX to [facility] For Xarelto 20 mg

## 2016-07-09 NOTE — Telephone Encounter (Signed)
Pt needs her Xalatan 20mg  sent to [facility].

## 2016-07-29 ENCOUNTER — Ambulatory Visit (INDEPENDENT_AMBULATORY_CARE_PROVIDER_SITE_OTHER): Payer: Medicare Other | Admitting: Internal Medicine

## 2016-07-29 DIAGNOSIS — H401411 Capsular glaucoma with pseudoexfoliation of lens, right eye, mild stage: Secondary | ICD-10-CM | POA: Diagnosis not present

## 2016-07-29 DIAGNOSIS — H401423 Capsular glaucoma with pseudoexfoliation of lens, left eye, severe stage: Secondary | ICD-10-CM | POA: Diagnosis not present

## 2016-08-14 DIAGNOSIS — M1711 Unilateral primary osteoarthritis, right knee: Secondary | ICD-10-CM | POA: Diagnosis not present

## 2016-08-20 ENCOUNTER — Encounter (INDEPENDENT_AMBULATORY_CARE_PROVIDER_SITE_OTHER): Payer: Self-pay | Admitting: Internal Medicine

## 2016-08-20 ENCOUNTER — Ambulatory Visit (INDEPENDENT_AMBULATORY_CARE_PROVIDER_SITE_OTHER): Payer: Medicare Other | Admitting: Internal Medicine

## 2016-08-20 ENCOUNTER — Encounter (INDEPENDENT_AMBULATORY_CARE_PROVIDER_SITE_OTHER): Payer: Self-pay | Admitting: *Deleted

## 2016-08-20 VITALS — BP 110/68 | HR 66 | Temp 97.8°F | Resp 18 | Ht 67.0 in | Wt 175.9 lb

## 2016-08-20 DIAGNOSIS — R1314 Dysphagia, pharyngoesophageal phase: Secondary | ICD-10-CM | POA: Diagnosis not present

## 2016-08-20 DIAGNOSIS — R1310 Dysphagia, unspecified: Secondary | ICD-10-CM

## 2016-08-20 DIAGNOSIS — K219 Gastro-esophageal reflux disease without esophagitis: Secondary | ICD-10-CM

## 2016-08-20 DIAGNOSIS — R1319 Other dysphagia: Secondary | ICD-10-CM

## 2016-08-20 NOTE — Progress Notes (Signed)
Presenting complaint;  Follow-up for GERD. Patient complains of solid food dysphagia.  Subjective:  Hailey Graham is 82 year old Caucasian female who has chronic GERD and on PPI on when necessary basis who presents with few months of solid food dysphagia. She has most difficulty with bread. She has no difficulty with liquids. She may have heartburn at night. However she feels her food stays in her esophagus usually after evening meal. However she does not have nausea or vomiting. She is not having dysphagia every day. When it does occur she gets spontaneous relief within 15 minutes. She has good appetite. Her weight has been stable. Bowels move every 2-3 days. She denies melena or rectal bleeding.  Last EGD was in June 2012 revealing benign gastric polyp and H. pylori stains were negative.    Current Medications: Outpatient Encounter Prescriptions as of 08/20/2016  Medication Sig  . ALPRAZolam (XANAX) 0.5 MG tablet Take 0.25 mg by mouth 3 (three) times daily as needed for anxiety. Order is 0.05 tid prn  . apraclonidine (IOPIDINE) 0.5 % ophthalmic solution Place 1 drop into the right eye 2 (two) times daily.   . bimatoprost (LUMIGAN) 0.01 % SOLN Place 1 drop into the right eye at bedtime.   . Cholecalciferol (VITAMIN D) 2000 UNITS tablet Take 2,000 Units by mouth daily.  Marland Kitchen docusate sodium (COLACE) 250 MG capsule Take 250 mg by mouth daily.  . dorzolamide-timolol (COSOPT) 22.3-6.8 MG/ML ophthalmic solution Place 1 drop into the right eye 2 (two) times daily.  Marland Kitchen ezetimibe-simvastatin (VYTORIN) 10-20 MG per tablet Take 1 tablet by mouth at bedtime.  . metoprolol succinate (TOPROL-XL) 25 MG 24 hr tablet Take 12.5 mg by mouth daily.   . pantoprazole (PROTONIX) 40 MG tablet Take 40 mg by mouth daily as needed.  . rivaroxaban (XARELTO) 20 MG TABS tablet Take 1 tablet (20 mg total) by mouth daily.  . [DISCONTINUED] traMADol (ULTRAM) 50 MG tablet Take 1-2 tablets (50-100 mg total) by mouth every 6 (six)  hours as needed for moderate pain or severe pain. (Patient not taking: Reported on 08/20/2016)   No facility-administered encounter medications on file as of 08/20/2016.      Objective: Blood pressure 110/68, pulse 66, temperature 97.8 F (36.6 C), temperature source Oral, resp. rate 18, height 5\' 7"  (1.702 m), weight 175 lb 14.4 oz (79.8 kg). Patient is alert and in no acute distress. Conjunctiva is pink. Sclera is nonicteric Oropharyngeal mucosa is normal. No neck masses or thyromegaly noted. Cardiac exam with regular rhythm normal S1 and S2. No murmur or gallop noted. Lungs are clear to auscultation. Abdomen is symmetrical soft and nontender without organomegaly or masses. No LE edema or clubbing noted.   Assessment:  #1. GERD. Symptoms are well controlled with dietary measures and when necessary pantoprazole. #2. Solid food dysphagia. She possibly has esophageal motility disorder but she could have Schatzki's ring or esophageal stricture given history of GERD.   Plan:  Barium pill esophagogram. Further recommendations to follow.

## 2016-08-20 NOTE — Patient Instructions (Signed)
Barium pill esophagogram to be scheduled. 

## 2016-08-21 DIAGNOSIS — M1711 Unilateral primary osteoarthritis, right knee: Secondary | ICD-10-CM | POA: Diagnosis not present

## 2016-08-29 DIAGNOSIS — M1711 Unilateral primary osteoarthritis, right knee: Secondary | ICD-10-CM | POA: Diagnosis not present

## 2016-09-13 LAB — BASIC METABOLIC PANEL: GLUCOSE: 116 mg/dL

## 2016-09-17 ENCOUNTER — Encounter: Payer: Self-pay | Admitting: Hematology and Oncology

## 2016-09-17 ENCOUNTER — Ambulatory Visit (HOSPITAL_BASED_OUTPATIENT_CLINIC_OR_DEPARTMENT_OTHER): Payer: Medicare Other | Admitting: Hematology and Oncology

## 2016-09-17 DIAGNOSIS — Z79810 Long term (current) use of selective estrogen receptor modulators (SERMs): Secondary | ICD-10-CM

## 2016-09-17 DIAGNOSIS — C50412 Malignant neoplasm of upper-outer quadrant of left female breast: Secondary | ICD-10-CM

## 2016-09-17 NOTE — Progress Notes (Signed)
Patient Care Team: Sinda Du, MD as PCP - General (Internal Medicine) Rogene Houston, MD (Gastroenterology) Excell Seltzer, MD as Consulting Physician (General Surgery) Nicholas Lose, MD as Consulting Physician (Hematology and Oncology) Arloa Koh, MD as Consulting Physician (Radiation Oncology) Sylvan Cheese, NP as Nurse Practitioner (Hematology and Oncology)  DIAGNOSIS: Breast cancer of upper-outer quadrant of left female breast [facility])   Staging form: Breast, AJCC 7th Edition   - Clinical stage from 11/29/2015: Stage IA (T1b, N0, M0) - Unsigned  SUMMARY OF ONCOLOGIC HISTORY:   Breast cancer of upper-outer quadrant of left female breast ([place])   11/02/2015 Mammogram    screening mammogram category B breast density, asymmetry with indistinct margin in the left breast upper outer quadrant posterior depth, 2 internal mammary lymph nodes, 6 mm at 2:00 position axillary negative      11/21/2015 Initial Diagnosis    left breast biopsy: Invasive ductal carcinoma with associated low-grade DCIS with microcalcifications focally involving a papilloma, ER/PR 100%, HER-2 negative ratio 1.22      12/13/2015 Surgery    Left lumpectomy: Invasive ductal carcinoma 0.4 cm, DCIS 1.6 cm, clear margins, ER 100%, PR 100%, HER-2 negative ratio 1.22, Ki-67 5% T1 N0 stage IA      12/21/2015 - 03/20/2016 Anti-estrogen oral therapy    Tamoxifen 20 mg daily decreased to 10 mg daily, unable to tolerate it.       CHIEF COMPLIANT: Follow-up of breast cancer  INTERVAL HISTORY: Hailey Graham is a 82 year old with above-mentioned history left breast cancer treated with lumpectomy and could not tolerate tamoxifen therapy. She is here for annual follow-up. She reports no pain or discomfort. Her friend who used to come with her all the time was recently diagnosed with temporal arteritis and is currently still having lots of pain and discomfort and vision problems related to it.  REVIEW OF  SYSTEMS:   Constitutional: Denies fevers, chills or abnormal weight loss Eyes: Denies blurriness of vision Ears, nose, mouth, throat, and face: Denies mucositis or sore throat Respiratory: Denies cough, dyspnea or wheezes Cardiovascular: Denies palpitation, chest discomfort Gastrointestinal:  Denies nausea, heartburn or change in bowel habits Skin: Denies abnormal skin rashes Lymphatics: Denies new lymphadenopathy or easy bruising Neurological:Denies numbness, tingling or new weaknesses Behavioral/Psych: Mood is stable, no new changes  Extremities: No lower extremity edema Breast:  denies any pain or lumps or nodules in either breasts All other systems were reviewed with the patient and are negative.  I have reviewed the past medical history, past surgical history, social history and family history with the patient and they are unchanged from previous note.  ALLERGIES:  is allergic to brimonidine; codeine; morphine; and sulfa drugs cross reactors.  MEDICATIONS:  Current Outpatient Prescriptions  Medication Sig Dispense Refill  . ALPRAZolam (XANAX) 0.5 MG tablet Take 0.25 mg by mouth 3 (three) times daily as needed for anxiety. Order is 0.05 tid prn    . apraclonidine (IOPIDINE) 0.5 % ophthalmic solution Place 1 drop into the right eye 2 (two) times daily.     . bimatoprost (LUMIGAN) 0.01 % SOLN Place 1 drop into the right eye at bedtime.     . Cholecalciferol (VITAMIN D) 2000 UNITS tablet Take 2,000 Units by mouth daily.    Marland Kitchen docusate sodium (COLACE) 250 MG capsule Take 250 mg by mouth daily.    . dorzolamide-timolol (COSOPT) 22.3-6.8 MG/ML ophthalmic solution Place 1 drop into the right eye 2 (two) times daily.  10  . ezetimibe-simvastatin (  VYTORIN) 10-20 MG per tablet Take 1 tablet by mouth at bedtime. 30 tablet 6  . metoprolol succinate (TOPROL-XL) 25 MG 24 hr tablet Take 12.5 mg by mouth daily.   11  . pantoprazole (PROTONIX) 40 MG tablet Take 40 mg by mouth daily as needed.    .  rivaroxaban (XARELTO) 20 MG TABS tablet Take 1 tablet (20 mg total) by mouth daily. 30 tablet 11   No current facility-administered medications for this visit.     PHYSICAL EXAMINATION: ECOG PERFORMANCE STATUS: 0 - Asymptomatic  Vitals:   09/17/16 1109  BP: 133/65  Pulse: 63  Resp: 18  Temp: 97.6 F (36.4 C)   Filed Weights   09/17/16 1109  Weight: 175 lb 14.4 oz (79.8 kg)    GENERAL:alert, no distress and comfortable SKIN: skin color, texture, turgor are normal, no rashes or significant lesions EYES: normal, Conjunctiva are pink and non-injected, sclera clear OROPHARYNX:no exudate, no erythema and lips, buccal mucosa, and tongue normal  NECK: supple, thyroid normal size, non-tender, without nodularity LYMPH:  no palpable lymphadenopathy in the cervical, axillary or inguinal LUNGS: clear to auscultation and percussion with normal breathing effort HEART: regular rate & rhythm and no murmurs and no lower extremity edema ABDOMEN:abdomen soft, non-tender and normal bowel sounds MUSCULOSKELETAL:no cyanosis of digits and no clubbing  NEURO: alert & oriented x 3 with fluent speech, no focal motor/sensory deficits EXTREMITIES: No lower extremity edema BREAST: No palpable masses or nodules in either right or left breasts. No palpable axillary supraclavicular or infraclavicular adenopathy no breast tenderness or nipple discharge. (exam performed in the presence of a chaperone)  LABORATORY DATA:  I have reviewed the data as listed   Chemistry      Component Value Date/Time   NA 142 12/12/2015 1137   NA 142 11/29/2015 1243   K 4.3 12/12/2015 1137   K 3.9 11/29/2015 1243   CL 109 12/12/2015 1137   CO2 28 12/12/2015 1137   CO2 23 11/29/2015 1243   BUN 24 (H) 12/12/2015 1137   BUN 21.9 11/29/2015 1243   CREATININE 0.81 12/12/2015 1137   CREATININE 0.8 11/29/2015 1243      Component Value Date/Time   CALCIUM 9.9 12/12/2015 1137   CALCIUM 9.9 11/29/2015 1243   ALKPHOS 79  11/29/2015 1243   AST 15 11/29/2015 1243   ALT 12 11/29/2015 1243   BILITOT 0.43 11/29/2015 1243       Lab Results  Component Value Date   WBC 6.4 12/12/2015   HGB 14.2 12/12/2015   HCT 44.1 12/12/2015   MCV 90.9 12/12/2015   PLT 215 12/12/2015   NEUTROABS 3.3 11/29/2015   ASSESSMENT & PLAN:  Breast cancer of upper-outer quadrant of left female breast ([place]) Left lumpectomy 12/13/2015: Invasive ductal carcinoma 0.4 cm, DCIS 1.6 cm, clear margins, ER 100%, PR 100%, HER-2 negative ratio 1.22, Ki-67 5% T1 N0 stage IA Prognosis:I anticipate her risk of relapse in the next 5 years would be around 10-12% and antiestrogen therapy would decrease this down to 5%.  Tamoxifen started 12/21/2015 discontinued 03/20/2016 (for severe hot flashes, night sweats)  Breast Cancer Surveillance: 1. Breast exam 09/17/2016: Benign 2. Mammogram will be done in November 2017 at [facility].   Return to clinic in 6 months for follow-up and then we can see her once a year.   No orders of the defined types were placed in this encounter.  The patient has a good understanding of the overall plan. she agrees with it.  she will call with any problems that may develop before the next visit here.   Rulon Eisenmenger, MD 09/17/16

## 2016-09-17 NOTE — Assessment & Plan Note (Signed)
Left lumpectomy 12/13/2015: Invasive ductal carcinoma 0.4 cm, DCIS 1.6 cm, clear margins, ER 100%, PR 100%, HER-2 negative ratio 1.22, Ki-67 5% T1 N0 stage IA Prognosis:I anticipate her risk of relapse in the next 5 years would be around 10-12% and antiestrogen therapy would decrease this down to 5%.  Tamoxifen started 12/21/2015 discontinued 03/20/2016 (for severe hot flashes, night sweats)  Breast Cancer Surveillance: 1. Breast exam 09/17/2016: Benign 2. Mammogram will be done in November 2017 at [facility].   Return to clinic in 6 months for follow-up

## 2016-09-24 DIAGNOSIS — X32XXXA Exposure to sunlight, initial encounter: Secondary | ICD-10-CM | POA: Diagnosis not present

## 2016-09-24 DIAGNOSIS — L820 Inflamed seborrheic keratosis: Secondary | ICD-10-CM | POA: Diagnosis not present

## 2016-09-24 DIAGNOSIS — L570 Actinic keratosis: Secondary | ICD-10-CM | POA: Diagnosis not present

## 2016-09-24 DIAGNOSIS — L821 Other seborrheic keratosis: Secondary | ICD-10-CM | POA: Diagnosis not present

## 2016-09-24 DIAGNOSIS — L723 Sebaceous cyst: Secondary | ICD-10-CM | POA: Diagnosis not present

## 2016-10-15 ENCOUNTER — Telehealth: Payer: Self-pay | Admitting: *Deleted

## 2016-10-15 ENCOUNTER — Other Ambulatory Visit: Payer: Self-pay

## 2016-10-15 DIAGNOSIS — C50412 Malignant neoplasm of upper-outer quadrant of left female breast: Secondary | ICD-10-CM

## 2016-10-15 NOTE — Telephone Encounter (Signed)
"  [name] is asking me what type of mammogram I need.  They believe diagnostic but would someone please call me to let me know what kind I need.  Call me before 1200 or after 3:00 PM, [phone]."

## 2016-10-15 NOTE — Telephone Encounter (Signed)
Called pt back to discuss further.  Informed pt she would need to have a bilateral diagnostic mammogram in November when she returns to [facility] for her annual.  Informed pt we would communicate with [name] to ensure they had everything they needed in place before her appt.  Pt without further questions or concerns at time of call.

## 2016-11-12 ENCOUNTER — Encounter: Payer: Self-pay | Admitting: Cardiovascular Disease

## 2016-11-12 ENCOUNTER — Ambulatory Visit (INDEPENDENT_AMBULATORY_CARE_PROVIDER_SITE_OTHER): Payer: Medicare Other | Admitting: Cardiovascular Disease

## 2016-11-12 VITALS — BP 110/70 | HR 74 | Ht 67.0 in | Wt 178.0 lb

## 2016-11-12 DIAGNOSIS — I4891 Unspecified atrial fibrillation: Secondary | ICD-10-CM

## 2016-11-12 DIAGNOSIS — Z23 Encounter for immunization: Secondary | ICD-10-CM

## 2016-11-12 DIAGNOSIS — Z7901 Long term (current) use of anticoagulants: Secondary | ICD-10-CM

## 2016-11-12 DIAGNOSIS — I480 Paroxysmal atrial fibrillation: Secondary | ICD-10-CM | POA: Diagnosis not present

## 2016-11-12 DIAGNOSIS — E7800 Pure hypercholesterolemia, unspecified: Secondary | ICD-10-CM

## 2016-11-12 NOTE — Progress Notes (Signed)
SUBJECTIVE: The patient presents for follow-up of paroxysmal atrial fibrillation.  The patient denies any symptoms of chest pain, palpitations, shortness of breath, lightheadedness, dizziness, leg swelling, orthopnea, PND, and syncope.  ECG performed in the office today which I personally reviewed demonstrates normal sinus rhythm with RBBB with no ischemic ST segment or T-wave abnormalities, nor any arrhythmias.    Review of Systems: As per "subjective", otherwise negative.  Allergies  Allergen Reactions  . Brimonidine Other (See Comments)    Crusting - difficulty opening eyes  . Codeine     Unspecified  . Morphine     Unspecified  . Sulfa Drugs Cross Reactors Other (See Comments)    Unspecified    Current Outpatient Prescriptions  Medication Sig Dispense Refill  . ALPRAZolam (XANAX) 0.5 MG tablet Take 0.25 mg by mouth 3 (three) times daily as needed for anxiety. Order is 0.05 tid prn    . apraclonidine (IOPIDINE) 0.5 % ophthalmic solution Place 1 drop into the right eye 2 (two) times daily.     . bimatoprost (LUMIGAN) 0.01 % SOLN Place 1 drop into the right eye at bedtime.     . Cholecalciferol (VITAMIN D) 2000 UNITS tablet Take 2,000 Units by mouth daily.    Marland Kitchen docusate sodium (COLACE) 250 MG capsule Take 250 mg by mouth daily.    . dorzolamide-timolol (COSOPT) 22.3-6.8 MG/ML ophthalmic solution Place 1 drop into the right eye 2 (two) times daily.  10  . ezetimibe-simvastatin (VYTORIN) 10-20 MG per tablet Take 1 tablet by mouth at bedtime. 30 tablet 6  . metoprolol succinate (TOPROL-XL) 25 MG 24 hr tablet Take 12.5 mg by mouth daily.   11  . pantoprazole (PROTONIX) 40 MG tablet Take 40 mg by mouth daily as needed.    . rivaroxaban (XARELTO) 20 MG TABS tablet Take 1 tablet (20 mg total) by mouth daily. 30 tablet 11   No current facility-administered medications for this visit.     Past Medical History:  Diagnosis Date  . Anxiety   . Arthritis   . Breast cancer of  upper-outer quadrant of left female breast ([place]) 11/24/2015  . Breast cancer of upper-outer quadrant of left female breast ([place]) 11/24/2015  . Complication of anesthesia   . Depression   . Diverticulosis   . Gait abnormality   . Gastroesophageal reflux disease   . Glaucoma   . Hyperlipidemia    Lipid profile in 05/2010:149, 86, 48, 84  . Lung nodule    Right lower lobe; stable in 2010  . Paroxysmal atrial fibrillation (HCC)    Rate related right bundle branch block; normal EF on echo in 2006; normal stress nuclear-2006; mild RV hypokinesis on MRI;  RA and RV enlargement on CT in 9/06; Anticoagulation->discontinued in 2009  . Personal history of kidney stones   . PONV (postoperative nausea and vomiting)    Nausea    Past Surgical History:  Procedure Laterality Date  . ABDOMINAL HYSTERECTOMY  2202  . ANKLE SURGERY Right 2003    Fracture- rod  . APPENDECTOMY     as a teenager  . BREAST LUMPECTOMY WITH RADIOACTIVE SEED LOCALIZATION Left 12/13/2015   Procedure: BREAST LUMPECTOMY WITH RADIOACTIVE SEED LOCALIZATION;  Surgeon: Excell Seltzer, MD;  Location: [place];  Service: General;  Laterality: Left;  . CATARACT EXTRACTION W/ INTRAOCULAR LENS IMPLANT Right 2014  . COLONOSCOPY W/ POLYPECTOMY    . ESOPHAGOGASTRODUODENOSCOPY    . GLAUCOMA SURGERY Right 2014  . HERNIA REPAIR  Right    1990's  . ORIF ANKLE FRACTURE  2003   Right  . TONSILLECTOMY     age 56  . Varicose veins Right 2207    Social History   Social History  . Marital status: Married    Spouse name: N/A  . Number of children: 0  . Years of education: 15   Occupational History  . Retired    Social History Main Topics  . Smoking status: Never Smoker  . Smokeless tobacco: Never Used  . Alcohol use No  . Drug use: No  . Sexual activity: No   Other Topics Concern  . Not on file   Social History Narrative   Lives at home with husband.   Right-handed.   No caffeine use.     Vitals:   11/12/16 1054  BP:  110/70  Pulse: 74  SpO2: 97%  Weight: 178 lb (80.7 kg)  Height: 5\' 7"  (1.702 m)    PHYSICAL EXAM General: NAD HEENT: Normal. Neck: No JVD, no thyromegaly. Lungs: Clear to auscultation bilaterally with normal respiratory effort. CV: Nondisplaced PMI.  Regular rate and rhythm, normal S1/S2, no S3/S4, no murmur. No pretibial or periankle edema.  No carotid bruit.   Abdomen: Soft, nontender, no distention.  Neurologic: Alert and oriented.  Psych: Normal affect. Skin: Normal. Musculoskeletal: No gross deformities.    ECG: Most recent ECG reviewed.      ASSESSMENT AND PLAN: 1. Paroxysmal atrial fibrillation: Symptomatically stable on low-dose metoprolol and Xarelto. Currently in a regular rhythm. No changes.  2. Will give flu vaccine as per her request.  3. Hyperlipidemia: Continue Vytorin.  Dispo: fu 1 yr.   Kate Sable, M.D., F.A.C.C.

## 2016-11-12 NOTE — Patient Instructions (Signed)
Your physician wants you to follow-up in: 1 year Dr Koneswaran You will receive a reminder letter in the mail two months in advance. If you don't receive a letter, please call our office to schedule the follow-up appointment.     Your physician recommends that you continue on your current medications as directed. Please refer to the Current Medication list given to you today.    If you need a refill on your cardiac medications before your next appointment, please call your pharmacy.      Thank you for choosing [facility] !         

## 2016-11-20 ENCOUNTER — Other Ambulatory Visit: Payer: Self-pay | Admitting: *Deleted

## 2016-11-20 DIAGNOSIS — I4891 Unspecified atrial fibrillation: Secondary | ICD-10-CM

## 2016-11-26 ENCOUNTER — Other Ambulatory Visit: Payer: Self-pay | Admitting: Cardiovascular Disease

## 2016-11-27 LAB — BASIC METABOLIC PANEL
BUN / CREAT RATIO: 29 — AB (ref 12–28)
BUN: 21 mg/dL (ref 8–27)
CO2: 25 mmol/L (ref 18–29)
CREATININE: 0.73 mg/dL (ref 0.57–1.00)
Calcium: 9.7 mg/dL (ref 8.7–10.3)
Chloride: 103 mmol/L (ref 96–106)
GFR calc Af Amer: 86 mL/min/{1.73_m2} (ref >59)
GFR calc non Af Amer: 75 mL/min/{1.73_m2} (ref >59)
GLUCOSE: 78 mg/dL (ref 65–99)
Potassium: 4.4 mmol/L (ref 3.5–5.2)
SODIUM: 141 mmol/L (ref 134–144)

## 2016-11-27 LAB — CBC, NO DIFFERENTIAL/PLATELET
Hematocrit: 40.4 % (ref 34.0–46.6)
Hemoglobin: 13.5 g/dL (ref 11.1–15.9)
MCH: 29.7 pg (ref 26.6–33.0)
MCHC: 33.4 g/dL (ref 31.5–35.7)
MCV: 89 fL (ref 79–97)
RBC: 4.55 x10E6/uL (ref 3.77–5.28)
RDW: 14.6 % (ref 12.3–15.4)
WBC: 6 10*3/uL (ref 3.4–10.8)

## 2017-01-01 ENCOUNTER — Ambulatory Visit: Payer: Self-pay | Admitting: *Deleted

## 2017-01-01 NOTE — Progress Notes (Signed)
Pt was started on Xarelto 20mg  daily for PAF.    Pt denies problems taking Xarelto.  She has not had any excessive bruising, bleeding or GI upset.  Reviewed patients medication list.  Pt is not currently on any combined P-gp and strong CYP3A4 inhibitors/inducers (ketoconazole, traconazole, ritonavir, carbamazepine, phenytoin, rifampin, St. John's wort).  Reviewed labs from 11/26/16.  SCr 0.73  Weight 80.7kg, CrCl 70.47.  Dose is appropriate based on CrCl.   Hgb:  13.5  A full discussion of the nature of anticoagulants has been carried out.  A benefit/risk analysis has been presented to the patient, so that they understand the justification for choosing anticoagulation with Xarelto at this time.  The need for compliance is stressed.  Pt is aware to take the medication once daily with the largest meal of the day.  Side effects of potential bleeding are discussed, including unusual colored urine or stools, coughing up blood or coffee ground emesis, nose bleeds or serious fall or head trauma.  Discussed signs and symptoms of stroke. The patient should avoid any OTC items containing aspirin or ibuprofen.  Avoid alcohol consumption.   Call if any signs of abnormal bleeding.  Discussed financial obligations and resolved any difficulty in obtaining medication.  Next lab test test in 6 months.   Labs discussed with patient.  6 month follow up placed in recall.

## 2017-01-03 ENCOUNTER — Other Ambulatory Visit (INDEPENDENT_AMBULATORY_CARE_PROVIDER_SITE_OTHER): Payer: Self-pay | Admitting: Internal Medicine

## 2017-01-06 DIAGNOSIS — M47816 Spondylosis without myelopathy or radiculopathy, lumbar region: Secondary | ICD-10-CM | POA: Diagnosis not present

## 2017-01-06 DIAGNOSIS — M545 Low back pain: Secondary | ICD-10-CM | POA: Diagnosis not present

## 2017-01-06 DIAGNOSIS — M9903 Segmental and somatic dysfunction of lumbar region: Secondary | ICD-10-CM | POA: Diagnosis not present

## 2017-01-14 DIAGNOSIS — M9903 Segmental and somatic dysfunction of lumbar region: Secondary | ICD-10-CM | POA: Diagnosis not present

## 2017-01-14 DIAGNOSIS — M47816 Spondylosis without myelopathy or radiculopathy, lumbar region: Secondary | ICD-10-CM | POA: Diagnosis not present

## 2017-01-14 DIAGNOSIS — M545 Low back pain: Secondary | ICD-10-CM | POA: Diagnosis not present

## 2017-01-20 DIAGNOSIS — H401423 Capsular glaucoma with pseudoexfoliation of lens, left eye, severe stage: Secondary | ICD-10-CM | POA: Diagnosis not present

## 2017-01-20 DIAGNOSIS — H401411 Capsular glaucoma with pseudoexfoliation of lens, right eye, mild stage: Secondary | ICD-10-CM | POA: Diagnosis not present

## 2017-02-12 DIAGNOSIS — C50412 Malignant neoplasm of upper-outer quadrant of left female breast: Secondary | ICD-10-CM | POA: Diagnosis not present

## 2017-03-14 ENCOUNTER — Telehealth: Payer: Self-pay | Admitting: Hematology and Oncology

## 2017-03-14 NOTE — Telephone Encounter (Signed)
Pt called to r/s 3/26 appt to a later date due to pending weather conditions expected for this weekend. Pt will call back 3/26 to see if 11 am slot is still open if road conditions are not bad

## 2017-03-17 ENCOUNTER — Ambulatory Visit: Payer: Medicare Other | Admitting: Hematology and Oncology

## 2017-03-20 DIAGNOSIS — M1712 Unilateral primary osteoarthritis, left knee: Secondary | ICD-10-CM | POA: Diagnosis not present

## 2017-03-20 DIAGNOSIS — M722 Plantar fascial fibromatosis: Secondary | ICD-10-CM | POA: Diagnosis not present

## 2017-04-15 ENCOUNTER — Ambulatory Visit: Payer: Medicare Other | Admitting: Hematology and Oncology

## 2017-04-21 DIAGNOSIS — H401423 Capsular glaucoma with pseudoexfoliation of lens, left eye, severe stage: Secondary | ICD-10-CM | POA: Diagnosis not present

## 2017-04-21 DIAGNOSIS — H401411 Capsular glaucoma with pseudoexfoliation of lens, right eye, mild stage: Secondary | ICD-10-CM | POA: Diagnosis not present

## 2017-04-23 NOTE — Assessment & Plan Note (Signed)
Left lumpectomy 12/13/2015: Invasive ductal carcinoma 0.4 cm, DCIS 1.6 cm, clear margins, ER 100%, PR 100%, HER-2 negative ratio 1.22, Ki-67 5% T1 N0 stage IA Prognosis:I anticipate her risk of relapse in the next 5 years would be around 10-12% and antiestrogen therapy would decrease this down to 5%.  Tamoxifen started 12/21/2015 discontinued 03/20/2016 (for severe hot flashes, night sweats)  Breast Cancer Surveillance: 1. Breast exam 04/24/17: Benign 2. Mammogram Nov 2017  Return to clinic in 6 months for follow-up and then we can see her once a year

## 2017-04-24 ENCOUNTER — Encounter: Payer: Self-pay | Admitting: Hematology and Oncology

## 2017-04-24 ENCOUNTER — Telehealth: Payer: Self-pay | Admitting: Hematology and Oncology

## 2017-04-24 ENCOUNTER — Ambulatory Visit (HOSPITAL_BASED_OUTPATIENT_CLINIC_OR_DEPARTMENT_OTHER): Payer: Medicare Other | Admitting: Hematology and Oncology

## 2017-04-24 DIAGNOSIS — Z170 Estrogen receptor positive status [ER+]: Secondary | ICD-10-CM

## 2017-04-24 DIAGNOSIS — C50412 Malignant neoplasm of upper-outer quadrant of left female breast: Secondary | ICD-10-CM

## 2017-04-24 NOTE — Progress Notes (Signed)
Patient Care Team: Sinda Du, MD as PCP - General (Internal Medicine) Rogene Houston, MD (Gastroenterology) Excell Seltzer, MD as Consulting Physician (General Surgery) Nicholas Lose, MD as Consulting Physician (Hematology and Oncology) Arloa Koh, MD as Consulting Physician (Radiation Oncology) Sylvan Cheese, NP as Nurse Practitioner (Hematology and Oncology)  DIAGNOSIS:  Encounter Diagnosis  Name Primary?  . Malignant neoplasm of upper-outer quadrant of left breast in female, estrogen receptor positive ([place])     SUMMARY OF ONCOLOGIC HISTORY:   Breast cancer of upper-outer quadrant of left female breast ([place])   11/02/2015 Mammogram    screening mammogram category B breast density, asymmetry with indistinct margin in the left breast upper outer quadrant posterior depth, 2 internal mammary lymph nodes, 6 mm at 2:00 position axillary negative      11/21/2015 Initial Diagnosis    left breast biopsy: Invasive ductal carcinoma with associated low-grade DCIS with microcalcifications focally involving a papilloma, ER/PR 100%, HER-2 negative ratio 1.22      12/13/2015 Surgery    Left lumpectomy: Invasive ductal carcinoma 0.4 cm, DCIS 1.6 cm, clear margins, ER 100%, PR 100%, HER-2 negative ratio 1.22, Ki-67 5% T1 N0 stage IA      12/21/2015 - 03/20/2016 Anti-estrogen oral therapy    Tamoxifen 20 mg daily decreased to 10 mg daily, unable to tolerate it.       CHIEF COMPLIANT: surveillance of breast cancer  INTERVAL HISTORY: Hailey Graham is a 83 year old with above-mentioned history of left breast cancer who underwent lumpectomy and is currently on surveillance. She could not tolerate tamoxifen. She denies any new pain or discomfort. She stays very active taking care of her husband who is mostly bedridden. She has had recent problems with glaucoma. She needs a procedure done on the right eye.  REVIEW OF SYSTEMS:   Constitutional: Denies fevers, chills or  abnormal weight loss Eyes: glaucoma Ears, nose, mouth, throat, and face: Denies mucositis or sore throat Respiratory: Denies cough, dyspnea or wheezes Cardiovascular: Denies palpitation, chest discomfort Gastrointestinal:  Denies nausea, heartburn or change in bowel habits Skin: Denies abnormal skin rashes Lymphatics: Denies new lymphadenopathy or easy bruising Neurological:Denies numbness, tingling or new weaknesses Behavioral/Psych: Mood is stable, no new changes  Extremities: No lower extremity edema Breast:  denies any pain or lumps or nodules in either breasts All other systems were reviewed with the patient and are negative.  I have reviewed the past medical history, past surgical history, social history and family history with the patient and they are unchanged from previous note.  ALLERGIES:  is allergic to brimonidine; codeine; morphine; and sulfa drugs cross reactors.  MEDICATIONS:  Current Outpatient Prescriptions  Medication Sig Dispense Refill  . ALPRAZolam (XANAX) 0.5 MG tablet Take 0.25 mg by mouth 3 (three) times daily as needed for anxiety. Order is 0.05 tid prn    . apraclonidine (IOPIDINE) 0.5 % ophthalmic solution Place 1 drop into the right eye 2 (two) times daily.     . bimatoprost (LUMIGAN) 0.01 % SOLN Place 1 drop into the right eye at bedtime.     . Cholecalciferol (VITAMIN D) 2000 UNITS tablet Take 2,000 Units by mouth daily.    Marland Kitchen docusate sodium (COLACE) 250 MG capsule Take 250 mg by mouth daily.    . dorzolamide-timolol (COSOPT) 22.3-6.8 MG/ML ophthalmic solution Place 1 drop into the right eye 2 (two) times daily.  10  . ezetimibe-simvastatin (VYTORIN) 10-20 MG per tablet Take 1 tablet by mouth at bedtime. 30 tablet  6  . metoprolol succinate (TOPROL-XL) 25 MG 24 hr tablet Take 12.5 mg by mouth daily.   11  . pantoprazole (PROTONIX) 40 MG tablet TAKE (1) TABLET BY MOUTH EACH MORNING. 90 tablet 4  . rivaroxaban (XARELTO) 20 MG TABS tablet Take 1 tablet (20 mg  total) by mouth daily. 30 tablet 11   No current facility-administered medications for this visit.     PHYSICAL EXAMINATION: ECOG PERFORMANCE STATUS: 0 - Asymptomatic  Vitals:   04/24/17 1432  BP: 127/67  Pulse: 73  Resp: 16  Temp: 97.6 F (36.4 C)   Filed Weights   04/24/17 1432  Weight: 174 lb 1.6 oz (79 kg)    GENERAL:alert, no distress and comfortable SKIN: skin color, texture, turgor are normal, no rashes or significant lesions EYES: normal, Conjunctiva are pink and non-injected, sclera clear OROPHARYNX:no exudate, no erythema and lips, buccal mucosa, and tongue normal  NECK: supple, thyroid normal size, non-tender, without nodularity LYMPH:  no palpable lymphadenopathy in the cervical, axillary or inguinal LUNGS: clear to auscultation and percussion with normal breathing effort HEART: regular rate & rhythm and no murmurs and no lower extremity edema ABDOMEN:abdomen soft, non-tender and normal bowel sounds MUSCULOSKELETAL:no cyanosis of digits and no clubbing  NEURO: alert & oriented x 3 with fluent speech, no focal motor/sensory deficits EXTREMITIES: No lower extremity edema BREAST: No palpable masses or nodules in either right or left breasts. No palpable axillary supraclavicular or infraclavicular adenopathy no breast tenderness or nipple discharge. (exam performed in the presence of a chaperone)  LABORATORY DATA:  I have reviewed the data as listed   Chemistry      Component Value Date/Time   NA 141 11/26/2016 1258   NA 142 11/29/2015 1243   K 4.4 11/26/2016 1258   K 3.9 11/29/2015 1243   CL 103 11/26/2016 1258   CO2 25 11/26/2016 1258   CO2 23 11/29/2015 1243   BUN 21 11/26/2016 1258   BUN 21.9 11/29/2015 1243   CREATININE 0.73 11/26/2016 1258   CREATININE 0.8 11/29/2015 1243   GLU 116 09/13/2016      Component Value Date/Time   CALCIUM 9.7 11/26/2016 1258   CALCIUM 9.9 11/29/2015 1243   ALKPHOS 79 11/29/2015 1243   AST 15 11/29/2015 1243   ALT 12  11/29/2015 1243   BILITOT 0.43 11/29/2015 1243       Lab Results  Component Value Date   WBC 6.0 11/26/2016   HGB 14.2 12/12/2015   HCT 40.4 11/26/2016   MCV 89 11/26/2016   PLT 215 12/12/2015   NEUTROABS 3.3 11/29/2015    ASSESSMENT & PLAN:  Breast cancer of upper-outer quadrant of left female breast ([employer]) Left lumpectomy 12/13/2015: Invasive ductal carcinoma 0.4 cm, DCIS 1.6 cm, clear margins, ER 100%, PR 100%, HER-2 negative ratio 1.22, Ki-67 5% T1 N0 stage IA Prognosis:I anticipate her risk of relapse in the next 5 years would be around 10-12% and antiestrogen therapy would decrease this down to 5%.  Tamoxifen started 12/21/2015 discontinued 03/20/2016 (for severe hot flashes, night sweats)  Breast Cancer Surveillance: 1. Breast exam 04/24/17: Benign 2. Mammogram Nov 2017  Return to clinic in 1 year for follow-up.   I spent 25 minutes talking to the patient of which more than half was spent in counseling and coordination of care.  No orders of the defined types were placed in this encounter.  The patient has a good understanding of the overall plan. she agrees with it. she will call  with any problems that may develop before the next visit here.   Rulon Eisenmenger, MD 04/24/17

## 2017-04-24 NOTE — Telephone Encounter (Signed)
Appointments scheduled per 04/24/17 los.  °Patient was given a copy of the AVS report and appointment schedule per 04/24/17 los. °

## 2017-05-14 DIAGNOSIS — C44119 Basal cell carcinoma of skin of left eyelid, including canthus: Secondary | ICD-10-CM | POA: Diagnosis not present

## 2017-05-14 DIAGNOSIS — D485 Neoplasm of uncertain behavior of skin: Secondary | ICD-10-CM | POA: Diagnosis not present

## 2017-05-14 DIAGNOSIS — L821 Other seborrheic keratosis: Secondary | ICD-10-CM | POA: Diagnosis not present

## 2017-05-14 DIAGNOSIS — L570 Actinic keratosis: Secondary | ICD-10-CM | POA: Diagnosis not present

## 2017-05-14 DIAGNOSIS — Z85828 Personal history of other malignant neoplasm of skin: Secondary | ICD-10-CM | POA: Diagnosis not present

## 2017-05-28 DIAGNOSIS — Z961 Presence of intraocular lens: Secondary | ICD-10-CM | POA: Diagnosis not present

## 2017-05-28 DIAGNOSIS — H401411 Capsular glaucoma with pseudoexfoliation of lens, right eye, mild stage: Secondary | ICD-10-CM | POA: Diagnosis not present

## 2017-05-28 DIAGNOSIS — H2511 Age-related nuclear cataract, right eye: Secondary | ICD-10-CM | POA: Diagnosis not present

## 2017-06-03 DIAGNOSIS — M722 Plantar fascial fibromatosis: Secondary | ICD-10-CM | POA: Diagnosis not present

## 2017-06-05 DIAGNOSIS — C44112 Basal cell carcinoma of skin of right eyelid, including canthus: Secondary | ICD-10-CM | POA: Diagnosis not present

## 2017-07-23 ENCOUNTER — Telehealth: Payer: Self-pay | Admitting: *Deleted

## 2017-07-23 NOTE — Telephone Encounter (Signed)
Called to check the status of Xarelto assistance application. Spoke with Angelita Ingles who states that application is complete except full SSN.

## 2017-07-28 DIAGNOSIS — I10 Essential (primary) hypertension: Secondary | ICD-10-CM | POA: Diagnosis not present

## 2017-07-28 DIAGNOSIS — E785 Hyperlipidemia, unspecified: Secondary | ICD-10-CM | POA: Diagnosis not present

## 2017-07-28 DIAGNOSIS — I4891 Unspecified atrial fibrillation: Secondary | ICD-10-CM | POA: Diagnosis not present

## 2017-07-28 DIAGNOSIS — K210 Gastro-esophageal reflux disease with esophagitis: Secondary | ICD-10-CM | POA: Diagnosis not present

## 2017-08-07 NOTE — Telephone Encounter (Signed)
Appeal placed with Xarelto b/c spouse has passed and patient is currently living with one income.

## 2017-08-13 ENCOUNTER — Telehealth: Payer: Self-pay

## 2017-08-13 DIAGNOSIS — H401411 Capsular glaucoma with pseudoexfoliation of lens, right eye, mild stage: Secondary | ICD-10-CM | POA: Diagnosis not present

## 2017-08-13 DIAGNOSIS — H2511 Age-related nuclear cataract, right eye: Secondary | ICD-10-CM | POA: Diagnosis not present

## 2017-08-13 DIAGNOSIS — I4891 Unspecified atrial fibrillation: Secondary | ICD-10-CM | POA: Diagnosis not present

## 2017-08-13 DIAGNOSIS — Z7901 Long term (current) use of anticoagulants: Secondary | ICD-10-CM | POA: Diagnosis not present

## 2017-08-13 NOTE — Telephone Encounter (Addendum)
Pt came in office to inform Dr. Bronson Ing that she will have cataract surgery at [facility] on Sept. 4th. She want's to know when to stop her xarelto prior to her procedure. Please advise.

## 2017-08-14 ENCOUNTER — Encounter: Payer: Self-pay | Admitting: *Deleted

## 2017-08-14 ENCOUNTER — Telehealth: Payer: Self-pay | Admitting: Cardiovascular Disease

## 2017-08-14 NOTE — Telephone Encounter (Signed)
Needs okay to stop Xarelto prior to procedure / tg

## 2017-08-14 NOTE — Telephone Encounter (Signed)
Does not need to hold for cataract surgery.

## 2017-08-14 NOTE — Telephone Encounter (Signed)
Called pt. Informed her that she does not need to stop her xarelto for her cataract surgery. She also stated that she is having glaucoma surgery too. Spoke with Dr. Bronson Ing and he stated she can hold xarelto for 24 hours prior to that procedure.

## 2017-08-14 NOTE — Telephone Encounter (Signed)
Spoke with pt. Informed her that she does not have to hold her xarelto for her procedure.She voiced understanding.

## 2017-08-19 ENCOUNTER — Telehealth: Payer: Self-pay | Admitting: Cardiovascular Disease

## 2017-08-19 NOTE — Telephone Encounter (Signed)
Needs to know directions for Xarelto prior to Cataract surgery / tg

## 2017-08-19 NOTE — Telephone Encounter (Signed)
Spoke with pt, then called the office that is doing her eye surgery and spoke with Jana Half. She stated that their office usually hold their xarelto for 2-3 days prior to eye procedures. She will contact us once she speaks with her surgeon.

## 2017-08-26 DIAGNOSIS — H2511 Age-related nuclear cataract, right eye: Secondary | ICD-10-CM | POA: Diagnosis not present

## 2017-08-26 DIAGNOSIS — Z9842 Cataract extraction status, left eye: Secondary | ICD-10-CM | POA: Diagnosis not present

## 2017-08-26 DIAGNOSIS — Z961 Presence of intraocular lens: Secondary | ICD-10-CM | POA: Diagnosis not present

## 2017-08-26 DIAGNOSIS — H401411 Capsular glaucoma with pseudoexfoliation of lens, right eye, mild stage: Secondary | ICD-10-CM | POA: Diagnosis not present

## 2017-08-26 DIAGNOSIS — H401113 Primary open-angle glaucoma, right eye, severe stage: Secondary | ICD-10-CM | POA: Diagnosis not present

## 2017-08-27 DIAGNOSIS — H401411 Capsular glaucoma with pseudoexfoliation of lens, right eye, mild stage: Secondary | ICD-10-CM | POA: Diagnosis not present

## 2017-08-27 DIAGNOSIS — H2511 Age-related nuclear cataract, right eye: Secondary | ICD-10-CM | POA: Diagnosis not present

## 2017-08-27 DIAGNOSIS — Z961 Presence of intraocular lens: Secondary | ICD-10-CM | POA: Diagnosis not present

## 2017-08-27 DIAGNOSIS — H401113 Primary open-angle glaucoma, right eye, severe stage: Secondary | ICD-10-CM | POA: Diagnosis not present

## 2017-08-27 DIAGNOSIS — Z9842 Cataract extraction status, left eye: Secondary | ICD-10-CM | POA: Diagnosis not present

## 2017-09-04 DIAGNOSIS — Z882 Allergy status to sulfonamides status: Secondary | ICD-10-CM | POA: Diagnosis not present

## 2017-09-04 DIAGNOSIS — H401411 Capsular glaucoma with pseudoexfoliation of lens, right eye, mild stage: Secondary | ICD-10-CM | POA: Diagnosis not present

## 2017-09-04 DIAGNOSIS — Z885 Allergy status to narcotic agent status: Secondary | ICD-10-CM | POA: Diagnosis not present

## 2017-09-04 DIAGNOSIS — Z9842 Cataract extraction status, left eye: Secondary | ICD-10-CM | POA: Diagnosis not present

## 2017-09-04 DIAGNOSIS — H401423 Capsular glaucoma with pseudoexfoliation of lens, left eye, severe stage: Secondary | ICD-10-CM | POA: Diagnosis not present

## 2017-09-04 DIAGNOSIS — Z961 Presence of intraocular lens: Secondary | ICD-10-CM | POA: Diagnosis not present

## 2017-09-04 DIAGNOSIS — Z888 Allergy status to other drugs, medicaments and biological substances status: Secondary | ICD-10-CM | POA: Diagnosis not present

## 2017-09-04 DIAGNOSIS — Z48811 Encounter for surgical aftercare following surgery on the nervous system: Secondary | ICD-10-CM | POA: Diagnosis not present

## 2017-09-04 DIAGNOSIS — Z9841 Cataract extraction status, right eye: Secondary | ICD-10-CM | POA: Diagnosis not present

## 2017-09-04 DIAGNOSIS — I4891 Unspecified atrial fibrillation: Secondary | ICD-10-CM | POA: Diagnosis not present

## 2017-09-04 DIAGNOSIS — Z7901 Long term (current) use of anticoagulants: Secondary | ICD-10-CM | POA: Diagnosis not present

## 2017-09-10 ENCOUNTER — Telehealth: Payer: Self-pay | Admitting: Cardiovascular Disease

## 2017-09-10 NOTE — Telephone Encounter (Signed)
Placed samples of xarelto 20 mg out fron for pt. To pick up. Called pt to inform her, no answer. Unable to leave message.

## 2017-09-10 NOTE — Telephone Encounter (Signed)
Samples of Xarelto / tg

## 2017-09-11 NOTE — Telephone Encounter (Signed)
Pt notified samples are at front desk

## 2017-09-12 DIAGNOSIS — H401423 Capsular glaucoma with pseudoexfoliation of lens, left eye, severe stage: Secondary | ICD-10-CM | POA: Diagnosis not present

## 2017-09-12 DIAGNOSIS — H401411 Capsular glaucoma with pseudoexfoliation of lens, right eye, mild stage: Secondary | ICD-10-CM | POA: Diagnosis not present

## 2017-10-02 DIAGNOSIS — C50412 Malignant neoplasm of upper-outer quadrant of left female breast: Secondary | ICD-10-CM | POA: Diagnosis not present

## 2017-10-06 ENCOUNTER — Other Ambulatory Visit: Payer: Self-pay | Admitting: Cardiovascular Disease

## 2017-10-08 DIAGNOSIS — Z23 Encounter for immunization: Secondary | ICD-10-CM | POA: Diagnosis not present

## 2017-11-03 DIAGNOSIS — D1800 Hemangioma unspecified site: Secondary | ICD-10-CM | POA: Diagnosis not present

## 2017-11-03 DIAGNOSIS — Z85828 Personal history of other malignant neoplasm of skin: Secondary | ICD-10-CM | POA: Diagnosis not present

## 2017-11-03 DIAGNOSIS — L570 Actinic keratosis: Secondary | ICD-10-CM | POA: Diagnosis not present

## 2017-11-07 ENCOUNTER — Ambulatory Visit: Payer: Medicare Other | Admitting: Cardiovascular Disease

## 2017-11-07 ENCOUNTER — Encounter: Payer: Self-pay | Admitting: Cardiovascular Disease

## 2017-11-07 VITALS — BP 128/80 | HR 82 | Ht 67.0 in | Wt 171.0 lb

## 2017-11-07 DIAGNOSIS — E7800 Pure hypercholesterolemia, unspecified: Secondary | ICD-10-CM | POA: Diagnosis not present

## 2017-11-07 DIAGNOSIS — I8393 Asymptomatic varicose veins of bilateral lower extremities: Secondary | ICD-10-CM

## 2017-11-07 DIAGNOSIS — Z7901 Long term (current) use of anticoagulants: Secondary | ICD-10-CM

## 2017-11-07 DIAGNOSIS — I480 Paroxysmal atrial fibrillation: Secondary | ICD-10-CM

## 2017-11-07 NOTE — Progress Notes (Signed)
SUBJECTIVE: The patient presents for follow-up of paroxysmal atrial fibrillation.  Her husband passed away earlier this year.  ECG performed in the office today demonstrated sinus rhythm with right bundle branch block and PACs.  The patient denies any symptoms of chest pain, palpitations, shortness of breath, lightheadedness, dizziness, orthopnea, PND, and syncope.  She has bilateral venous varicosities and is wearing a compression stocking on the right leg.  She denies leg pain.  She told me she underwent cataract and glaucoma surgery on both eyes this year and intraocular pressures are now normal and she no longer has to take medications for this.  She has no children.  She has a niece who is a retired Marine scientist from [employer] who checks on her who lives in [place].     Review of Systems: As per "subjective", otherwise negative.  Allergies  Allergen Reactions  . Brimonidine Other (See Comments)    Crusting - difficulty opening eyes  . Codeine     Unspecified  . Morphine     Unspecified  . Sulfa Drugs Cross Reactors Other (See Comments)    Unspecified    Current Outpatient Medications  Medication Sig Dispense Refill  . ALPRAZolam (XANAX) 0.5 MG tablet Take 0.25 mg by mouth 3 (three) times daily as needed for anxiety. Order is 0.05 tid prn    . Cholecalciferol (VITAMIN D) 2000 UNITS tablet Take 2,000 Units by mouth daily.    Marland Kitchen docusate sodium (COLACE) 250 MG capsule Take 250 mg by mouth daily.    Marland Kitchen ezetimibe-simvastatin (VYTORIN) 10-20 MG per tablet Take 1 tablet by mouth at bedtime. 30 tablet 6  . metoprolol succinate (TOPROL-XL) 25 MG 24 hr tablet Take 12.5 mg by mouth daily.   11  . pantoprazole (PROTONIX) 40 MG tablet TAKE (1) TABLET BY MOUTH EACH MORNING. 90 tablet 4  . XARELTO 20 MG TABS tablet TAKE (1) TABLET BY MOUTH ONCE DAILY. 30 tablet 0   No current facility-administered medications for this visit.     Past Medical History:  Diagnosis Date  .  Anxiety   . Arthritis   . Breast cancer of upper-outer quadrant of left female breast ([place]) 11/24/2015  . Breast cancer of upper-outer quadrant of left female breast ([place]) 11/24/2015  . Complication of anesthesia   . Depression   . Diverticulosis   . Gait abnormality   . Gastroesophageal reflux disease   . Glaucoma   . Hyperlipidemia    Lipid profile in 05/2010:149, 86, 48, 84  . Lung nodule    Right lower lobe; stable in 2010  . Paroxysmal atrial fibrillation (HCC)    Rate related right bundle branch block; normal EF on echo in 2006; normal stress nuclear-2006; mild RV hypokinesis on MRI;  RA and RV enlargement on CT in 9/06; Anticoagulation->discontinued in 2009  . Personal history of kidney stones   . PONV (postoperative nausea and vomiting)    Nausea    Past Surgical History:  Procedure Laterality Date  . ABDOMINAL HYSTERECTOMY  2202  . ANKLE SURGERY Right 2003    Fracture- rod  . APPENDECTOMY     as a teenager  . BREAST LUMPECTOMY WITH RADIOACTIVE SEED LOCALIZATION Left 12/13/2015   Performed by Excell Seltzer, MD at [facility]  . CATARACT EXTRACTION W/ INTRAOCULAR LENS IMPLANT Right 2014  . COLONOSCOPY W/ POLYPECTOMY    . ESOPHAGOGASTRODUODENOSCOPY    . GLAUCOMA SURGERY Right 2014  . HERNIA REPAIR Right    1990's  .  ORIF ANKLE FRACTURE  2003   Right  . TONSILLECTOMY     age 47  . Varicose veins Right 2207    Social History   Socioeconomic History  . Marital status: Married    Spouse name: Not on file  . Number of children: 0  . Years of education: 9  . Highest education level: Not on file  Social Needs  . Financial resource strain: Not on file  . Food insecurity - worry: Not on file  . Food insecurity - inability: Not on file  . Transportation needs - medical: Not on file  . Transportation needs - non-medical: Not on file  Occupational History  . Occupation: Retired  Tobacco Use  . Smoking status: Never Smoker  . Smokeless tobacco: Never Used  Substance  and Sexual Activity  . Alcohol use: No    Alcohol/week: 0.0 oz  . Drug use: No  . Sexual activity: No  Other Topics Concern  . Not on file  Social History Narrative   Lives at home with husband.   Right-handed.   No caffeine use.     Vitals:   11/07/17 1401  BP: 128/80  Pulse: 82  SpO2: 96%  Weight: 171 lb (77.6 kg)  Height: 5\' 7"  (1.702 m)    Wt Readings from Last 3 Encounters:  11/07/17 171 lb (77.6 kg)  04/24/17 174 lb 1.6 oz (79 kg)  11/12/16 178 lb (80.7 kg)     PHYSICAL EXAM General: NAD HEENT: Normal. Neck: No JVD, no thyromegaly. Lungs: Clear to auscultation bilaterally with normal respiratory effort. CV: Regular rate and rhythm, normal S1/S2, no S3/S4, no murmur. No pretibial or periankle edema.  No carotid bruit.  Bilateral venous varicosities of legs. Abdomen: Soft, nontender, no distention.  Neurologic: Alert and oriented.  Psych: Normal affect. Skin: Normal. Musculoskeletal: No gross deformities.    ECG: Most recent ECG reviewed.   Labs: Lab Results  Component Value Date/Time   K 4.4 11/26/2016 12:58 PM   K 3.9 11/29/2015 12:43 PM   BUN 21 11/26/2016 12:58 PM   BUN 21.9 11/29/2015 12:43 PM   CREATININE 0.73 11/26/2016 12:58 PM   CREATININE 0.8 11/29/2015 12:43 PM   ALT 12 11/29/2015 12:43 PM   TSH 2.207 01/29/2013 01:17 PM   HGB 13.5 11/26/2016 12:58 PM   HGB 13.2 11/29/2015 12:43 PM     Lipids: Lab Results  Component Value Date/Time   LDLCALC 90 07/15/2012 02:05 PM   CHOL 145 07/15/2012 02:05 PM   TRIG 61 07/15/2012 02:05 PM   HDL 43 07/15/2012 02:05 PM       ASSESSMENT AND PLAN:  1. Paroxysmal atrial fibrillation: Symptomatically stable on low-dose metoprolol and Xarelto. Currently in a regular rhythm. No changes.  2. Hyperlipidemia: Continue Vytorin.  I will check to see if lipids were performed by PCP this year.  If not, I will do so.  3.  Bilateral lower extremity venous varicosities: I have recommended leg elevation and  compression stockings.     Disposition: Follow up 1 year.   Kate Sable, M.D., F.A.C.C.

## 2017-11-07 NOTE — Patient Instructions (Signed)
Your physician wants you to follow-up in: 1 year with Dr.Koneswaran You will receive a reminder letter in the mail two months in advance. If you don't receive a letter, please call our office to schedule the follow-up appointment.    Your physician recommends that you continue on your current medications as directed. Please refer to the Current Medication list given to you today.   If you need a refill on your cardiac medications before your next appointment, please call your pharmacy.    No lab work or tests ordered      Thank you for choosing [facility] !         

## 2017-11-10 DIAGNOSIS — M9903 Segmental and somatic dysfunction of lumbar region: Secondary | ICD-10-CM | POA: Diagnosis not present

## 2017-11-10 DIAGNOSIS — M47816 Spondylosis without myelopathy or radiculopathy, lumbar region: Secondary | ICD-10-CM | POA: Diagnosis not present

## 2017-11-10 DIAGNOSIS — M545 Low back pain: Secondary | ICD-10-CM | POA: Diagnosis not present

## 2017-11-11 ENCOUNTER — Telehealth: Payer: Self-pay | Admitting: *Deleted

## 2017-11-11 ENCOUNTER — Telehealth: Payer: Self-pay | Admitting: Cardiovascular Disease

## 2017-11-11 DIAGNOSIS — M9903 Segmental and somatic dysfunction of lumbar region: Secondary | ICD-10-CM | POA: Diagnosis not present

## 2017-11-11 DIAGNOSIS — R5383 Other fatigue: Secondary | ICD-10-CM

## 2017-11-11 DIAGNOSIS — M47816 Spondylosis without myelopathy or radiculopathy, lumbar region: Secondary | ICD-10-CM | POA: Diagnosis not present

## 2017-11-11 DIAGNOSIS — M545 Low back pain: Secondary | ICD-10-CM | POA: Diagnosis not present

## 2017-11-11 DIAGNOSIS — E785 Hyperlipidemia, unspecified: Secondary | ICD-10-CM

## 2017-11-11 NOTE — Telephone Encounter (Signed)
Dr. Luan Pulling office does not have current labs. Orders placed for lipids.

## 2017-11-11 NOTE — Telephone Encounter (Signed)
Would like to know if she's supposed to have lab work done--will be home till about 11:15

## 2017-11-11 NOTE — Telephone Encounter (Signed)
Orders placed.

## 2017-11-12 ENCOUNTER — Other Ambulatory Visit (HOSPITAL_COMMUNITY)
Admission: RE | Admit: 2017-11-12 | Discharge: 2017-11-12 | Disposition: A | Discharge status: Home / Self Care | Payer: Medicare Other | Source: Ambulatory Visit | Attending: Cardiovascular Disease | Admitting: Cardiovascular Disease

## 2017-11-12 ENCOUNTER — Ambulatory Visit: Payer: Medicare Other | Admitting: Cardiovascular Disease

## 2017-11-12 DIAGNOSIS — E785 Hyperlipidemia, unspecified: Secondary | ICD-10-CM | POA: Insufficient documentation

## 2017-11-12 DIAGNOSIS — R5383 Other fatigue: Secondary | ICD-10-CM | POA: Insufficient documentation

## 2017-11-12 DIAGNOSIS — M9903 Segmental and somatic dysfunction of lumbar region: Secondary | ICD-10-CM | POA: Diagnosis not present

## 2017-11-12 DIAGNOSIS — M47816 Spondylosis without myelopathy or radiculopathy, lumbar region: Secondary | ICD-10-CM | POA: Diagnosis not present

## 2017-11-12 DIAGNOSIS — M545 Low back pain: Secondary | ICD-10-CM | POA: Diagnosis not present

## 2017-11-12 LAB — IRON AND TIBC
IRON: 49 ug/dL (ref 28–170)
SATURATION RATIOS: 16 % (ref 10.4–31.8)
TIBC: 314 ug/dL (ref 250–450)
UIBC: 265 ug/dL

## 2017-11-12 LAB — LIPID PANEL
CHOL/HDL RATIO: 2.9 ratio
CHOLESTEROL: 144 mg/dL (ref 0–200)
HDL: 50 mg/dL (ref >40)
LDL CALC: 81 mg/dL (ref 0–99)
Triglycerides: 65 mg/dL (ref <150)
VLDL: 13 mg/dL (ref 0–40)

## 2017-11-12 LAB — FERRITIN: Ferritin: 50 ng/mL (ref 11–307)

## 2017-11-13 LAB — VITAMIN D 25 HYDROXY (VIT D DEFICIENCY, FRACTURES): Vit D, 25-Hydroxy: 41.3 ng/mL (ref 30.0–100.0)

## 2017-11-21 DIAGNOSIS — Z961 Presence of intraocular lens: Secondary | ICD-10-CM | POA: Diagnosis not present

## 2017-11-24 DIAGNOSIS — R928 Other abnormal and inconclusive findings on diagnostic imaging of breast: Secondary | ICD-10-CM | POA: Diagnosis not present

## 2017-11-24 DIAGNOSIS — Z853 Personal history of malignant neoplasm of breast: Secondary | ICD-10-CM | POA: Diagnosis not present

## 2017-11-24 LAB — HM MAMMOGRAPHY: HM Mammogram: NORMAL (ref 0–4)

## 2017-12-03 ENCOUNTER — Telehealth: Payer: Self-pay | Admitting: Cardiovascular Disease

## 2017-12-03 ENCOUNTER — Other Ambulatory Visit: Payer: Self-pay | Admitting: Cardiovascular Disease

## 2017-12-03 NOTE — Telephone Encounter (Signed)
Call patient regarding patient assistance program. / tg

## 2017-12-04 NOTE — Telephone Encounter (Signed)
Spoke with pt. She will come by office to sign assistance form.

## 2018-01-14 DIAGNOSIS — H401411 Capsular glaucoma with pseudoexfoliation of lens, right eye, mild stage: Secondary | ICD-10-CM | POA: Diagnosis not present

## 2018-01-14 DIAGNOSIS — H401423 Capsular glaucoma with pseudoexfoliation of lens, left eye, severe stage: Secondary | ICD-10-CM | POA: Diagnosis not present

## 2018-02-05 DIAGNOSIS — M1711 Unilateral primary osteoarthritis, right knee: Secondary | ICD-10-CM | POA: Diagnosis not present

## 2018-02-10 DIAGNOSIS — M1711 Unilateral primary osteoarthritis, right knee: Secondary | ICD-10-CM | POA: Diagnosis not present

## 2018-02-19 DIAGNOSIS — M1711 Unilateral primary osteoarthritis, right knee: Secondary | ICD-10-CM | POA: Diagnosis not present

## 2018-03-16 ENCOUNTER — Other Ambulatory Visit (INDEPENDENT_AMBULATORY_CARE_PROVIDER_SITE_OTHER): Payer: Self-pay | Admitting: Internal Medicine

## 2018-03-16 NOTE — Telephone Encounter (Signed)
Per Dr.Rehman the patient will need to have  Appointment with Terri prior to further refills.

## 2018-03-17 NOTE — Telephone Encounter (Signed)
Patient requested Rehman only - scheduled for 06-30-18 at 3:30

## 2018-03-17 NOTE — Telephone Encounter (Signed)
Noted  

## 2018-03-18 DIAGNOSIS — L821 Other seborrheic keratosis: Secondary | ICD-10-CM | POA: Diagnosis not present

## 2018-03-18 DIAGNOSIS — L570 Actinic keratosis: Secondary | ICD-10-CM | POA: Diagnosis not present

## 2018-03-19 ENCOUNTER — Ambulatory Visit (HOSPITAL_COMMUNITY)
Admission: RE | Admit: 2018-03-19 | Discharge: 2018-03-19 | Disposition: A | Discharge status: Home / Self Care | Payer: Medicare Other | Source: Ambulatory Visit | Attending: Pulmonary Disease | Admitting: Pulmonary Disease

## 2018-03-19 ENCOUNTER — Other Ambulatory Visit (HOSPITAL_COMMUNITY): Payer: Self-pay | Admitting: Pulmonary Disease

## 2018-03-19 DIAGNOSIS — M79661 Pain in right lower leg: Secondary | ICD-10-CM

## 2018-03-19 DIAGNOSIS — M7989 Other specified soft tissue disorders: Secondary | ICD-10-CM | POA: Diagnosis not present

## 2018-03-19 DIAGNOSIS — K210 Gastro-esophageal reflux disease with esophagitis: Secondary | ICD-10-CM | POA: Diagnosis not present

## 2018-03-19 DIAGNOSIS — E785 Hyperlipidemia, unspecified: Secondary | ICD-10-CM | POA: Diagnosis not present

## 2018-03-19 DIAGNOSIS — R200 Anesthesia of skin: Secondary | ICD-10-CM | POA: Diagnosis not present

## 2018-03-19 DIAGNOSIS — I10 Essential (primary) hypertension: Secondary | ICD-10-CM | POA: Diagnosis not present

## 2018-03-19 DIAGNOSIS — I4891 Unspecified atrial fibrillation: Secondary | ICD-10-CM | POA: Diagnosis not present

## 2018-03-20 DIAGNOSIS — K210 Gastro-esophageal reflux disease with esophagitis: Secondary | ICD-10-CM | POA: Diagnosis not present

## 2018-03-20 DIAGNOSIS — I4891 Unspecified atrial fibrillation: Secondary | ICD-10-CM | POA: Diagnosis not present

## 2018-03-20 DIAGNOSIS — E785 Hyperlipidemia, unspecified: Secondary | ICD-10-CM | POA: Diagnosis not present

## 2018-03-20 DIAGNOSIS — I10 Essential (primary) hypertension: Secondary | ICD-10-CM | POA: Diagnosis not present

## 2018-03-20 LAB — TSH: TSH: 4.95 (ref <=5.90)

## 2018-04-13 ENCOUNTER — Ambulatory Visit (INDEPENDENT_AMBULATORY_CARE_PROVIDER_SITE_OTHER): Payer: Medicare Other | Admitting: Otolaryngology

## 2018-04-13 DIAGNOSIS — K219 Gastro-esophageal reflux disease without esophagitis: Secondary | ICD-10-CM

## 2018-04-13 DIAGNOSIS — R490 Dysphonia: Secondary | ICD-10-CM | POA: Diagnosis not present

## 2018-04-16 DIAGNOSIS — H401423 Capsular glaucoma with pseudoexfoliation of lens, left eye, severe stage: Secondary | ICD-10-CM | POA: Diagnosis not present

## 2018-04-16 DIAGNOSIS — H401411 Capsular glaucoma with pseudoexfoliation of lens, right eye, mild stage: Secondary | ICD-10-CM | POA: Diagnosis not present

## 2018-04-22 DIAGNOSIS — Z9842 Cataract extraction status, left eye: Secondary | ICD-10-CM | POA: Diagnosis not present

## 2018-04-22 DIAGNOSIS — Z9841 Cataract extraction status, right eye: Secondary | ICD-10-CM | POA: Diagnosis not present

## 2018-04-22 DIAGNOSIS — Z961 Presence of intraocular lens: Secondary | ICD-10-CM | POA: Diagnosis not present

## 2018-04-22 DIAGNOSIS — C50412 Malignant neoplasm of upper-outer quadrant of left female breast: Secondary | ICD-10-CM | POA: Diagnosis not present

## 2018-04-23 ENCOUNTER — Telehealth: Payer: Self-pay

## 2018-04-23 ENCOUNTER — Telehealth: Payer: Self-pay | Admitting: Hematology and Oncology

## 2018-04-23 NOTE — Telephone Encounter (Signed)
Tried to call patient regarding voicemail °

## 2018-04-23 NOTE — Telephone Encounter (Signed)
Returned pt call. She saw Dr. Stark Bray yesterday and had a breast examination and they went over her most recent mammogram. She does not feel she needs to come in for appt tomorrow. Appt canceled. Scheduling message sent for appt at a later date.  Cyndia Bent RN

## 2018-04-24 ENCOUNTER — Telehealth: Payer: Self-pay | Admitting: Hematology and Oncology

## 2018-04-24 ENCOUNTER — Inpatient Hospital Stay: Payer: Medicare Other | Admitting: Hematology and Oncology

## 2018-04-24 NOTE — Telephone Encounter (Signed)
Mailed patient calendar of upcoming November appointments per 5/2 sch message.

## 2018-05-01 ENCOUNTER — Telehealth: Payer: Self-pay | Admitting: *Deleted

## 2018-05-01 NOTE — Telephone Encounter (Signed)
Call from pt asking why appointment was moved out to Nov. Informed her that since she was recently examined by Dr. Excell Seltzer, this appointment was rescheduled so she is being evaluated every 6 months. Pt voiced understanding.

## 2018-05-13 DIAGNOSIS — L570 Actinic keratosis: Secondary | ICD-10-CM | POA: Diagnosis not present

## 2018-05-14 DIAGNOSIS — E782 Mixed hyperlipidemia: Secondary | ICD-10-CM | POA: Diagnosis not present

## 2018-05-14 DIAGNOSIS — K210 Gastro-esophageal reflux disease with esophagitis: Secondary | ICD-10-CM | POA: Diagnosis not present

## 2018-05-14 DIAGNOSIS — I4891 Unspecified atrial fibrillation: Secondary | ICD-10-CM | POA: Diagnosis not present

## 2018-05-14 DIAGNOSIS — I10 Essential (primary) hypertension: Secondary | ICD-10-CM | POA: Diagnosis not present

## 2018-05-14 DIAGNOSIS — Z0000 Encounter for general adult medical examination without abnormal findings: Secondary | ICD-10-CM | POA: Diagnosis not present

## 2018-07-07 ENCOUNTER — Ambulatory Visit (INDEPENDENT_AMBULATORY_CARE_PROVIDER_SITE_OTHER): Payer: Medicare Other | Admitting: Internal Medicine

## 2018-07-07 ENCOUNTER — Encounter (INDEPENDENT_AMBULATORY_CARE_PROVIDER_SITE_OTHER): Payer: Self-pay | Admitting: Internal Medicine

## 2018-07-07 VITALS — BP 114/68 | HR 66 | Temp 98.1°F | Resp 18 | Ht 67.0 in | Wt 176.8 lb

## 2018-07-07 DIAGNOSIS — K219 Gastro-esophageal reflux disease without esophagitis: Secondary | ICD-10-CM | POA: Diagnosis not present

## 2018-07-07 MED ORDER — PANTOPRAZOLE SODIUM 40 MG PO TBEC: DELAYED_RELEASE_TABLET | ORAL | 2 refills | Status: DC

## 2018-07-07 NOTE — Progress Notes (Signed)
Presenting complaint;  Follow-up for chronic GERD.  Subjective:  Patient is 84 year old Caucasian female who is here for scheduled visit.  She was last seen in August 2017.  She was not able to come last year.  She is on pantoprazole 40 mg daily.  She states she is doing well.  She rarely has heartburn.  She has been having intermittent hoarseness which comes and goes.  She saw Dr. Benjamine Mola and no other abnormality was noted.  It was felt her hoarseness was due to GERD.  She denies dysphagia nausea vomiting or abdominal pain.  Her bowels move daily.  She denies melena or rectal bleeding.  She remains on anticoagulant because of history of paroxysmal atrial fibrillation. She does complain of feeling unsteady when she walks on an uneven surface.  She has not had any falls.  She was evaluated by physical therapist few years ago.  She is very concerned because her late husband fell and sustained an intracerebral bleed while on anticoagulant.  Current Medications: Outpatient Encounter Medications as of 07/07/2018  Medication Sig  . ALPRAZolam (XANAX) 0.5 MG tablet Take 0.25 mg by mouth 3 (three) times daily as needed for anxiety. Order is 0.05 tid prn  . Cholecalciferol (VITAMIN D) 2000 UNITS tablet Take 2,000 Units by mouth daily.  Marland Kitchen docusate sodium (COLACE) 250 MG capsule Take 250 mg by mouth daily.  Marland Kitchen ezetimibe-simvastatin (VYTORIN) 10-20 MG per tablet Take 1 tablet by mouth at bedtime.  . metoprolol succinate (TOPROL-XL) 25 MG 24 hr tablet Take 12.5 mg by mouth daily.   . pantoprazole (PROTONIX) 40 MG tablet TAKE (1) TABLET BY MOUTH EACH MORNING.  Alveda Reasons 20 MG TABS tablet TAKE (1) TABLET BY MOUTH ONCE DAILY.   No facility-administered encounter medications on file as of 07/07/2018.      Objective: Blood pressure 114/68, pulse 66, temperature 98.1 F (36.7 C), temperature source Oral, resp. rate 18, height 5\' 7"  (1.702 m), weight 176 lb 12.8 oz (80.2 kg). Patient is alert and in no acute  distress. Conjunctiva is pink. Sclera is nonicteric Oropharyngeal mucosa is normal. No neck masses or thyromegaly noted. Cardiac exam with regular rhythm normal S1 and S2. No murmur or gallop noted. Lungs are clear to auscultation. Abdomenis symmetrical soft and nontender with organomegaly or masses. No LE edema or clubbing noted.    Assessment:  #1.  Chronic GERD.  She had a EGD in June 2012 revealing mild changes of reflux esophagitis at GE junction.  She is doing well as well as typical symptoms of GERD are concerned i.e. heartburn but she is still having intermittent hoarseness but is not getting any worse.  Recent ENT evaluation negative for vocal cord lesions.   Plan:  Continue antireflux measures as before. Will try her on pantoprazole 40 mg every other day.  Patient will try this for 8 to 12 weeks and see how she does.  If hoarseness worsens or she has frequent heartburn she can go back on daily schedule; she will call office otherwise she will call with progress report in 12 weeks.  If every other day schedule works will change her prescription then. New prescription given for pantoprazole. Office visit in 1 year.

## 2018-07-07 NOTE — Patient Instructions (Signed)
Can try pantoprazole every other day.  If every other day schedule does not work can go back to daily schedule. Call with progress report in 3 months

## 2018-07-28 DIAGNOSIS — L570 Actinic keratosis: Secondary | ICD-10-CM | POA: Diagnosis not present

## 2018-07-28 DIAGNOSIS — L821 Other seborrheic keratosis: Secondary | ICD-10-CM | POA: Diagnosis not present

## 2018-07-28 DIAGNOSIS — L819 Disorder of pigmentation, unspecified: Secondary | ICD-10-CM | POA: Diagnosis not present

## 2018-07-28 DIAGNOSIS — L728 Other follicular cysts of the skin and subcutaneous tissue: Secondary | ICD-10-CM | POA: Diagnosis not present

## 2018-08-25 DIAGNOSIS — M1711 Unilateral primary osteoarthritis, right knee: Secondary | ICD-10-CM | POA: Diagnosis not present

## 2018-08-31 DIAGNOSIS — H401423 Capsular glaucoma with pseudoexfoliation of lens, left eye, severe stage: Secondary | ICD-10-CM | POA: Diagnosis not present

## 2018-08-31 DIAGNOSIS — H401411 Capsular glaucoma with pseudoexfoliation of lens, right eye, mild stage: Secondary | ICD-10-CM | POA: Diagnosis not present

## 2018-08-31 DIAGNOSIS — M1711 Unilateral primary osteoarthritis, right knee: Secondary | ICD-10-CM | POA: Diagnosis not present

## 2018-09-08 DIAGNOSIS — M1711 Unilateral primary osteoarthritis, right knee: Secondary | ICD-10-CM | POA: Diagnosis not present

## 2018-09-09 ENCOUNTER — Other Ambulatory Visit (HOSPITAL_COMMUNITY): Payer: Self-pay | Admitting: Pulmonary Disease

## 2018-09-09 ENCOUNTER — Ambulatory Visit (HOSPITAL_COMMUNITY)
Admission: RE | Admit: 2018-09-09 | Discharge: 2018-09-09 | Disposition: A | Discharge status: Home / Self Care | Payer: Medicare Other | Source: Ambulatory Visit | Attending: Pulmonary Disease | Admitting: Pulmonary Disease

## 2018-09-09 DIAGNOSIS — R042 Hemoptysis: Secondary | ICD-10-CM

## 2018-09-09 DIAGNOSIS — J449 Chronic obstructive pulmonary disease, unspecified: Secondary | ICD-10-CM | POA: Insufficient documentation

## 2018-09-09 DIAGNOSIS — I10 Essential (primary) hypertension: Secondary | ICD-10-CM | POA: Diagnosis not present

## 2018-09-09 DIAGNOSIS — I4891 Unspecified atrial fibrillation: Secondary | ICD-10-CM | POA: Diagnosis not present

## 2018-09-09 DIAGNOSIS — K210 Gastro-esophageal reflux disease with esophagitis: Secondary | ICD-10-CM | POA: Diagnosis not present

## 2018-09-22 DIAGNOSIS — L570 Actinic keratosis: Secondary | ICD-10-CM | POA: Diagnosis not present

## 2018-09-22 DIAGNOSIS — Z85828 Personal history of other malignant neoplasm of skin: Secondary | ICD-10-CM | POA: Diagnosis not present

## 2018-09-22 DIAGNOSIS — L821 Other seborrheic keratosis: Secondary | ICD-10-CM | POA: Diagnosis not present

## 2018-09-30 ENCOUNTER — Telehealth: Payer: Self-pay | Admitting: Hematology and Oncology

## 2018-09-30 NOTE — Telephone Encounter (Signed)
VG PAL 11/8 - moved f/u to 11/6. Spoke with patient.

## 2018-10-01 ENCOUNTER — Telehealth: Payer: Self-pay | Admitting: Hematology and Oncology

## 2018-10-01 NOTE — Telephone Encounter (Signed)
appts r/s from 11/6 per phone message from patient 10/10

## 2018-10-05 DIAGNOSIS — M9903 Segmental and somatic dysfunction of lumbar region: Secondary | ICD-10-CM | POA: Diagnosis not present

## 2018-10-05 DIAGNOSIS — M5442 Lumbago with sciatica, left side: Secondary | ICD-10-CM | POA: Diagnosis not present

## 2018-10-05 DIAGNOSIS — M7062 Trochanteric bursitis, left hip: Secondary | ICD-10-CM | POA: Diagnosis not present

## 2018-10-05 DIAGNOSIS — M47816 Spondylosis without myelopathy or radiculopathy, lumbar region: Secondary | ICD-10-CM | POA: Diagnosis not present

## 2018-10-06 DIAGNOSIS — M7062 Trochanteric bursitis, left hip: Secondary | ICD-10-CM | POA: Diagnosis not present

## 2018-10-06 DIAGNOSIS — M47816 Spondylosis without myelopathy or radiculopathy, lumbar region: Secondary | ICD-10-CM | POA: Diagnosis not present

## 2018-10-06 DIAGNOSIS — M9903 Segmental and somatic dysfunction of lumbar region: Secondary | ICD-10-CM | POA: Diagnosis not present

## 2018-10-06 DIAGNOSIS — M5442 Lumbago with sciatica, left side: Secondary | ICD-10-CM | POA: Diagnosis not present

## 2018-10-08 DIAGNOSIS — M7062 Trochanteric bursitis, left hip: Secondary | ICD-10-CM | POA: Diagnosis not present

## 2018-10-08 DIAGNOSIS — M47816 Spondylosis without myelopathy or radiculopathy, lumbar region: Secondary | ICD-10-CM | POA: Diagnosis not present

## 2018-10-08 DIAGNOSIS — M5442 Lumbago with sciatica, left side: Secondary | ICD-10-CM | POA: Diagnosis not present

## 2018-10-08 DIAGNOSIS — M9903 Segmental and somatic dysfunction of lumbar region: Secondary | ICD-10-CM | POA: Diagnosis not present

## 2018-10-14 DIAGNOSIS — M9903 Segmental and somatic dysfunction of lumbar region: Secondary | ICD-10-CM | POA: Diagnosis not present

## 2018-10-14 DIAGNOSIS — M5442 Lumbago with sciatica, left side: Secondary | ICD-10-CM | POA: Diagnosis not present

## 2018-10-14 DIAGNOSIS — M7062 Trochanteric bursitis, left hip: Secondary | ICD-10-CM | POA: Diagnosis not present

## 2018-10-14 DIAGNOSIS — M47816 Spondylosis without myelopathy or radiculopathy, lumbar region: Secondary | ICD-10-CM | POA: Diagnosis not present

## 2018-10-16 ENCOUNTER — Telehealth: Payer: Self-pay | Admitting: *Deleted

## 2018-10-16 DIAGNOSIS — Z23 Encounter for immunization: Secondary | ICD-10-CM | POA: Diagnosis not present

## 2018-10-16 NOTE — Telephone Encounter (Signed)
Returned call to patient.  She is c/o slight swelling of right leg and some pain.  She is elevating and wearing compression stockings.  I encouraged her to increase the elevation and to call us back if swelling should worsen.  Patient voiced understanding of the instructions.

## 2018-10-28 ENCOUNTER — Ambulatory Visit: Payer: Medicare Other | Admitting: Hematology and Oncology

## 2018-10-30 ENCOUNTER — Ambulatory Visit: Payer: Medicare Other | Admitting: Hematology and Oncology

## 2018-10-30 ENCOUNTER — Other Ambulatory Visit: Payer: Medicare Other

## 2018-11-02 NOTE — Progress Notes (Signed)
Patient Care Team: Sinda Du, MD as PCP - General (Internal Medicine) Rogene Houston, MD (Gastroenterology) Excell Seltzer, MD as Consulting Physician (General Surgery) Nicholas Lose, MD as Consulting Physician (Hematology and Oncology) Arloa Koh, MD as Consulting Physician (Radiation Oncology) Sylvan Cheese, NP as Nurse Practitioner (Hematology and Oncology)  DIAGNOSIS:    ICD-10-CM   1. Malignant neoplasm of upper-outer quadrant of left breast in female, estrogen receptor positive ([place]) C50.412    Z17.0     SUMMARY OF ONCOLOGIC HISTORY:   Breast cancer of upper-outer quadrant of left female breast ([place])   11/02/2015 Mammogram    screening mammogram category B breast density, asymmetry with indistinct margin in the left breast upper outer quadrant posterior depth, 2 internal mammary lymph nodes, 6 mm at 2:00 position axillary negative    11/21/2015 Initial Diagnosis    left breast biopsy: Invasive ductal carcinoma with associated low-grade DCIS with microcalcifications focally involving a papilloma, ER/PR 100%, HER-2 negative ratio 1.22    12/13/2015 Surgery    Left lumpectomy: Invasive ductal carcinoma 0.4 cm, DCIS 1.6 cm, clear margins, ER 100%, PR 100%, HER-2 negative ratio 1.22, Ki-67 5% T1 N0 stage IA    12/21/2015 - 03/20/2016 Anti-estrogen oral therapy    Tamoxifen 20 mg daily decreased to 10 mg daily, unable to tolerate it.    CHIEF COMPLIANT: Surveillance of breast cancer  INTERVAL HISTORY: Hailey Graham is a 84 y.o. with above-mentioned history of left breast cancer treated with lumpectomy and limited Tamoxifen and is currently on surveillance. She was last seen by me 1.5 years ago. Her last mammogram in 11/2017 was negative for malignancy.  She presents to the clinic today by herself. She notes she has been doing well with her overall health. She notes recent bursitis in her left upper leg. She feels this most when ambulating. She denies  breast pain, lumps or nodules. She notes her BP was initially significantly elevated at 180/92 today. When redone it decreased to 148/89. Her BP is usually WNL. She notes been exercising often, but does go walking. Her next mammogram is 11/2018.  We reviewed her medication list, since her glaucoma surgery she is no longer on eye drops.   REVIEW OF SYSTEMS:   Constitutional: Denies fevers, chills or abnormal weight loss Eyes: Denies blurriness of vision Ears, nose, mouth, throat, and face: Denies mucositis or sore throat Respiratory: Denies cough, dyspnea or wheezes Cardiovascular: Denies palpitation, chest discomfort Gastrointestinal:  Denies nausea, heartburn or change in bowel habits Skin: Denies abnormal skin rashes MSK: (+) Bursitis in upper left leg.  Lymphatics: Denies new lymphadenopathy or easy bruising Neurological:Denies numbness, tingling or new weaknesses Behavioral/Psych: Mood is stable, no new changes  Extremities: No lower extremity edema Breast: denies any pain or lumps or nodules in either breasts All other systems were reviewed with the patient and are negative.  I have reviewed the past medical history, past surgical history, social history and family history with the patient and they are unchanged from previous note.  ALLERGIES:  is allergic to brimonidine; codeine; morphine; and sulfa drugs cross reactors.  MEDICATIONS:  Current Outpatient Medications  Medication Sig Dispense Refill  . ALPRAZolam (XANAX) 0.5 MG tablet Take 0.25 mg by mouth 3 (three) times daily as needed for anxiety. Order is 0.05 tid prn    . Cholecalciferol (VITAMIN D) 2000 UNITS tablet Take 2,000 Units by mouth daily.    Marland Kitchen docusate sodium (COLACE) 250 MG capsule Take 250 mg by mouth  daily.    . ezetimibe-simvastatin (VYTORIN) 10-20 MG per tablet Take 1 tablet by mouth at bedtime. 30 tablet 6  . metoprolol succinate (TOPROL-XL) 25 MG 24 hr tablet Take 12.5 mg by mouth daily.   11  . pantoprazole  (PROTONIX) 40 MG tablet TAKE (1) TABLET BY MOUTH EACH MORNING. 90 tablet 2  . XARELTO 20 MG TABS tablet TAKE (1) TABLET BY MOUTH ONCE DAILY. 30 tablet 6   No current facility-administered medications for this visit.     PHYSICAL EXAMINATION: ECOG PERFORMANCE STATUS: 1 - Symptomatic but completely ambulatory  Vitals:   11/03/18 1134  BP: (!) 148/89  Pulse: (!) 57  Resp: 16  Temp: 97.8 F (36.6 C)  SpO2: 99%   Filed Weights   11/03/18 1134  Weight: 177 lb 9.6 oz (80.6 kg)    GENERAL:alert, no distress and comfortable SKIN: skin color, texture, turgor are normal, no rashes or significant lesions EYES: normal, Conjunctiva are pink and non-injected, sclera clear OROPHARYNX:no exudate, no erythema and lips, buccal mucosa, and tongue normal  NECK: supple, thyroid normal size, non-tender, without nodularity LYMPH:  no palpable lymphadenopathy in the cervical, axillary or inguinal LUNGS: clear to auscultation and percussion with normal breathing effort HEART: regular rate & rhythm and no murmurs and no lower extremity edema ABDOMEN:abdomen soft, non-tender and normal bowel sounds MUSCULOSKELETAL:no cyanosis of digits and no clubbing  NEURO: alert & oriented x 3 with fluent speech, no focal motor/sensory deficits EXTREMITIES: No lower extremity edema BREAST: No palpable masses or nodules in either right or left breasts. No palpable axillary supraclavicular or infraclavicular adenopathy no breast tenderness or nipple discharge. (exam performed in the presence of a chaperone)  LABORATORY DATA:  I have reviewed the data as listed CMP Latest Ref Rng & Units 11/26/2016 12/12/2015 11/29/2015  Glucose 65 - 99 mg/dL 78 108(H) 87  BUN 8 - 27 mg/dL 21 24(H) 21.9  Creatinine 0.57 - 1.00 mg/dL 0.73 0.81 0.8  Sodium 134 - 144 mmol/L 141 142 142  Potassium 3.5 - 5.2 mmol/L 4.4 4.3 3.9  Chloride 96 - 106 mmol/L 103 109 -  CO2 18 - 29 mmol/L 25 28 23   Calcium 8.7 - 10.3 mg/dL 9.7 9.9 9.9  Total  Protein 6.4 - 8.3 g/dL - - 7.0  Total Bilirubin 0.20 - 1.20 mg/dL - - 0.43  Alkaline Phos 40 - 150 U/L - - 79  AST 5 - 34 U/L - - 15  ALT 0 - 55 U/L - - 12    Lab Results  Component Value Date   WBC 6.0 11/26/2016   HGB 13.5 11/26/2016   HCT 40.4 11/26/2016   MCV 89 11/26/2016   PLT 215 12/12/2015   NEUTROABS 3.3 11/29/2015    ASSESSMENT & PLAN:  Breast cancer of upper-outer quadrant of left female breast ([place]) Left lumpectomy 12/13/2015: Invasive ductal carcinoma 0.4 cm, DCIS 1.6 cm, clear margins, ER 100%, PR 100%, HER-2 negative ratio 1.22, Ki-67 5% T1 N0 stage IA Prognosis:I anticipate her risk of relapse in the next 5 years would be around 10-12% and antiestrogen therapy would decrease this down to 5%.  Tamoxifen started 12/21/2015 discontinued 03/20/2016 (for severe hot flashes, night sweats)  Breast Cancer Surveillance: 1. Breast exam 11/03/18: Benign 2. Mammogram 11/24/2017 at [name]: Benign, density B  Return to clinic in 1 year for follow-up. We will follow her once a year for 5 years and then she may be followed by her primary care physician.   No  orders of the defined types were placed in this encounter.  The patient has a good understanding of the overall plan. she agrees with it. she will call with any problems that may develop before the next visit here.  Nicholas Lose, MD 11/03/2018  Oneal Deputy, am acting as scribe for Nicholas Lose, MD.  I have reviewed the above documentation for accuracy and completeness, and I agree with the above.

## 2018-11-03 ENCOUNTER — Telehealth: Payer: Self-pay | Admitting: Hematology and Oncology

## 2018-11-03 ENCOUNTER — Inpatient Hospital Stay: Payer: Medicare Other | Attending: Hematology and Oncology | Admitting: Hematology and Oncology

## 2018-11-03 DIAGNOSIS — Z853 Personal history of malignant neoplasm of breast: Secondary | ICD-10-CM | POA: Diagnosis not present

## 2018-11-03 DIAGNOSIS — Z170 Estrogen receptor positive status [ER+]: Secondary | ICD-10-CM

## 2018-11-03 DIAGNOSIS — C50412 Malignant neoplasm of upper-outer quadrant of left female breast: Secondary | ICD-10-CM

## 2018-11-03 NOTE — Telephone Encounter (Signed)
Gave pt avs and calendar  °

## 2018-11-03 NOTE — Assessment & Plan Note (Signed)
Left lumpectomy 12/13/2015: Invasive ductal carcinoma 0.4 cm, DCIS 1.6 cm, clear margins, ER 100%, PR 100%, HER-2 negative ratio 1.22, Ki-67 5% T1 N0 stage IA Prognosis:I anticipate her risk of relapse in the next 5 years would be around 10-12% and antiestrogen therapy would decrease this down to 5%.  Tamoxifen started 12/21/2015 discontinued 03/20/2016 (for severe hot flashes, night sweats)  Breast Cancer Surveillance: 1. Breast exam 11/03/18: Benign 2. Mammogram 11/24/2017 at [name]: Benign, density B  Return to clinic in 1 year for follow-up.

## 2018-11-04 ENCOUNTER — Other Ambulatory Visit: Payer: Self-pay | Admitting: *Deleted

## 2018-11-04 DIAGNOSIS — I872 Venous insufficiency (chronic) (peripheral): Secondary | ICD-10-CM

## 2018-11-06 ENCOUNTER — Telehealth: Payer: Self-pay | Admitting: Vascular Surgery

## 2018-11-06 NOTE — Telephone Encounter (Signed)
sch appt spk to pt mld ltr 12/21/18 11am LE reflux 12pm MD

## 2018-11-06 NOTE — Telephone Encounter (Signed)
-----   Message from Rolla Flatten, RN sent at 11/04/2018  3:25 PM EST ----- She needs a reflux study of her right leg and a visit with a MD. She is in pain so let's try to get her in soon. Coming from [place] so let's try for same day appts. Thx!

## 2018-11-10 ENCOUNTER — Ambulatory Visit: Payer: Medicare Other | Admitting: Student

## 2018-11-10 ENCOUNTER — Encounter: Payer: Self-pay | Admitting: Student

## 2018-11-10 VITALS — BP 102/84 | HR 76 | Ht 67.0 in | Wt 176.0 lb

## 2018-11-10 DIAGNOSIS — E785 Hyperlipidemia, unspecified: Secondary | ICD-10-CM | POA: Diagnosis not present

## 2018-11-10 DIAGNOSIS — Z7901 Long term (current) use of anticoagulants: Secondary | ICD-10-CM | POA: Diagnosis not present

## 2018-11-10 DIAGNOSIS — I83813 Varicose veins of bilateral lower extremities with pain: Secondary | ICD-10-CM | POA: Diagnosis not present

## 2018-11-10 DIAGNOSIS — I480 Paroxysmal atrial fibrillation: Secondary | ICD-10-CM | POA: Diagnosis not present

## 2018-11-10 MED ORDER — SIMVASTATIN 20 MG PO TABS: 20.0000 mg | ORAL_TABLET | Freq: Every day | ORAL | 3 refills | Status: DC

## 2018-11-10 NOTE — Patient Instructions (Signed)
Medication Instructions:  Your physician has recommended you make the following change in your medication:  Stop Taking Vytorin Start Taking Simvastatin 20 mg Daily   If you need a refill on your cardiac medications before your next appointment, please call your pharmacy.   Lab work: Your physician recommends that you return for lab work in: 6 weeks   If you have labs (blood work) drawn today and your tests are completely normal, you will receive your results only by: Marland Kitchen MyChart Message (if you have MyChart) OR . A paper copy in the mail If you have any lab test that is abnormal or we need to change your treatment, we will call you to review the results.  Testing/Procedures: NONE   Follow-Up: At [facility], you and your health needs are our priority.  As part of our continuing mission to provide you with exceptional heart care, we have created designated Provider Care Teams.  These Care Teams include your primary Cardiologist (physician) and Advanced Practice Providers (APPs -  Physician Assistants and Nurse Practitioners) who all work together to provide you with the care you need, when you need it. You will need a follow up appointment in 1 years.  Please call our office 2 months in advance to schedule this appointment.  You may see Kate Sable, MD or one of the following Advanced Practice Providers on your designated Care Team:   Bernerd Pho, PA-C [facility]) . Ermalinda Barrios, PA-C ([place])  Any Other Special Instructions Will Be Listed Below (If Applicable). Thank you for choosing [place]!

## 2018-11-10 NOTE — Progress Notes (Signed)
Cardiology Office Note    Date:  11/10/2018   ID:  Hailey Graham, DOB 1928-05-22, MRN [id number]  PCP:  Sinda Du, MD  Cardiologist: Kate Sable, MD    Chief Complaint  Patient presents with  . Follow-up    Annual Visit    History of Present Illness:    Hailey Graham is a 84 y.o. female with past medical history of paroxysmal atrial fibrillation (on Xarelto), HLD, varicose veins, and history of breast cancer (s/p left lumpectomy in 11/2015) who presents to the office today for annual follow-up.    She was last examined by Dr. Bronson Ing in 10/2017 and denied any recent chest pain or dyspnea on exertion at that time. She reported a history of known bilateral venous varicosities and was using compression stockings regularly. She was continued on her current medication regimen including Metoprolol for rate control and Xarelto for anticoagulation.  In talking with the patient today, she reports overall doing well from a cardiac perspective since her last office visit. She denies any recent chest pain, dyspnea on exertion, orthopnea, PND, or palpitations. She does experience intermittent lower extremity edema but reports this improves when she utilizes compression stockings.  She does note occasional episodes of weakness when changing positions that only lasts for a few minutes at a time and spontaneously resolves. She denies any associated chest pain, palpitations, dyspnea, or syncope when this occurs. No associated blurred vision or headaches. This has only occurred 2-3 times over the past year. She has not checked her blood pressure or glucose during these episodes as she does not have home monitors.    Past Medical History:  Diagnosis Date  . Anxiety   . Arthritis   . Breast cancer of upper-outer quadrant of left female breast ([place]) 11/24/2015  . Breast cancer of upper-outer quadrant of left female breast ([place]) 11/24/2015  . Complication of anesthesia   . Depression     . Diverticulosis   . Gait abnormality   . Gastroesophageal reflux disease   . Glaucoma   . Hyperlipidemia    Lipid profile in 05/2010:149, 86, 48, 84  . Lung nodule    Right lower lobe; stable in 2010  . Paroxysmal atrial fibrillation (HCC)    Rate related right bundle branch block; normal EF on echo in 2006; normal stress nuclear-2006; mild RV hypokinesis on MRI;  RA and RV enlargement on CT in 9/06; Anticoagulation->discontinued in 2009  . Personal history of kidney stones   . PONV (postoperative nausea and vomiting)    Nausea    Past Surgical History:  Procedure Laterality Date  . ABDOMINAL HYSTERECTOMY  2202  . ANKLE SURGERY Right 2003    Fracture- rod  . APPENDECTOMY     as a teenager  . BREAST LUMPECTOMY WITH RADIOACTIVE SEED LOCALIZATION Left 12/13/2015   Procedure: BREAST LUMPECTOMY WITH RADIOACTIVE SEED LOCALIZATION;  Surgeon: Excell Seltzer, MD;  Location: [place];  Service: General;  Laterality: Left;  . CATARACT EXTRACTION W/ INTRAOCULAR LENS IMPLANT Right 2014  . COLONOSCOPY W/ POLYPECTOMY    . ESOPHAGOGASTRODUODENOSCOPY    . GLAUCOMA SURGERY Right 2014  . HERNIA REPAIR Right    1990's  . ORIF ANKLE FRACTURE  2003   Right  . TONSILLECTOMY     age 48  . Varicose veins Right 2207    Current Medications: Outpatient Medications Prior to Visit  Medication Sig Dispense Refill  . ALPRAZolam (XANAX) 0.5 MG tablet Take 0.25 mg by mouth 3 (three)  times daily as needed for anxiety. Order is 0.05 tid prn    . Cholecalciferol (VITAMIN D) 2000 UNITS tablet Take 2,000 Units by mouth daily.    Marland Kitchen docusate sodium (COLACE) 250 MG capsule Take 250 mg by mouth daily.    . metoprolol succinate (TOPROL-XL) 25 MG 24 hr tablet Take 12.5 mg by mouth daily.   11  . pantoprazole (PROTONIX) 40 MG tablet TAKE (1) TABLET BY MOUTH EACH MORNING. 90 tablet 2  . XARELTO 20 MG TABS tablet TAKE (1) TABLET BY MOUTH ONCE DAILY. 30 tablet 6  . ezetimibe-simvastatin (VYTORIN) 10-20 MG per tablet  Take 1 tablet by mouth at bedtime. 30 tablet 6   No facility-administered medications prior to visit.      Allergies:   Brimonidine; Codeine; Morphine; and Sulfa drugs cross reactors   Social History   Socioeconomic History  . Marital status: Married    Spouse name: Not on file  . Number of children: 0  . Years of education: 11  . Highest education level: Not on file  Occupational History  . Occupation: Retired  Scientific laboratory technician  . Financial resource strain: Not on file  . Food insecurity:    Worry: Not on file    Inability: Not on file  . Transportation needs:    Medical: Not on file    Non-medical: Not on file  Tobacco Use  . Smoking status: Never Smoker  . Smokeless tobacco: Never Used  Substance and Sexual Activity  . Alcohol use: No    Alcohol/week: 0.0 standard drinks  . Drug use: No  . Sexual activity: Never  Lifestyle  . Physical activity:    Days per week: Not on file    Minutes per session: Not on file  . Stress: Not on file  Relationships  . Social connections:    Talks on phone: Not on file    Gets together: Not on file    Attends religious service: Not on file    Active member of club or organization: Not on file    Attends meetings of clubs or organizations: Not on file    Relationship status: Not on file  Other Topics Concern  . Not on file  Social History Narrative   Lives at home with husband.   Right-handed.   No caffeine use.     Family History:  The patient's family history includes Cerebral aneurysm in her father; Healthy in her mother; Lung cancer in her sister.   Review of Systems:   Please see the history of present illness.     General:  No chills, fever, night sweats or weight changes.  Cardiovascular:  No chest pain, dyspnea on exertion, edema, orthopnea, palpitations, paroxysmal nocturnal dyspnea. Positive for lower extremity edema. Dermatological: No rash, lesions/masses Respiratory: No cough, dyspnea Urologic: No hematuria,  dysuria Abdominal:   No nausea, vomiting, diarrhea, bright red blood per rectum, melena, or hematemesis Neurologic:  No visual changes, wkns, changes in mental status.  All other systems reviewed and are otherwise negative except as noted above.   Physical Exam:    VS:  BP 102/84   Pulse 76   Ht 5\' 7"  (1.702 m)   Wt 176 lb (79.8 kg)   SpO2 95%   BMI 27.57 kg/m    General: Well developed, elderly Caucasian female appearing in no acute distress. Head: Normocephalic, atraumatic, sclera non-icteric, no xanthomas, nares are without discharge.  Neck: No carotid bruits. JVD not elevated.  Lungs: Respirations regular  and unlabored, without wheezes or rales.  Heart: Regular rate and rhythm. No S3 or S4.  No murmur, no rubs, or gallops appreciated. Abdomen: Soft, non-tender, non-distended with normoactive bowel sounds. No hepatomegaly. No rebound/guarding. No obvious abdominal masses. Msk:  Strength and tone appear normal for age. No joint deformities or effusions. Extremities: No clubbing or cyanosis. Trace ankle edema bilaterally.  Distal pedal pulses are 2+ bilaterally. Varicose veins present.  Neuro: Alert and oriented X 3. Moves all extremities spontaneously. No focal deficits noted. Psych:  Responds to questions appropriately with a normal affect. Skin: No rashes or lesions noted  Wt Readings from Last 3 Encounters:  11/10/18 176 lb (79.8 kg)  11/03/18 177 lb 9.6 oz (80.6 kg)  07/07/18 176 lb 12.8 oz (80.2 kg)     Studies/Labs Reviewed:   EKG:  EKG is ordered today.  The ekg ordered today demonstrates NSR, HR 68, with RBBB. No acute ST changes when compared to prior tracings.   Recent Labs: No results found for requested labs within last 8760 hours.   Lipid Panel    Component Value Date/Time   CHOL 144 11/12/2017 0845   TRIG 65 11/12/2017 0845   HDL 50 11/12/2017 0845   CHOLHDL 2.9 11/12/2017 0845   VLDL 13 11/12/2017 0845   LDLCALC 81 11/12/2017 0845    Additional  studies/ records that were reviewed today include:   Carotid Dopplers: 2003 [id number] ANTEGRADE FLOW PRESENT IN BILATERAL VERTEBRAL ARTERIES. IMPRESSION MINIMAL PLAQUE FORMATION WITHOUT EVIDENCE OF HEMODYNAMICALLY SIGNIFICANT STENOSIS.  Assessment:    1. Paroxysmal atrial fibrillation (HCC)   2. Chronic anticoagulation   3. Hyperlipidemia, unspecified hyperlipidemia type   4. Varicose veins of both lower extremities with pain      Plan:   In order of problems listed above:  1. Paroxysmal Atrial Fibrillation/ Use of Long-term Anticoagulation - Denies any recent palpitations and is maintaining normal sinus rhythm by examination and EKG today.  Will continue on Toprol-XL 12.5 mg daily for rate control. - She denies any recent melena, hematochezia, or hematuria. Remains on Xarelto 20 mg daily. Will request recent labs from her PCP to verify she is on the correct dosing given her kidney function.   2. HLD - followed by PCP. FLP in 10/2017 showed total cholesterol 144, triglycerides 65, HDL 50, and LDL 81. She reports that Vytorin is no longer covered by her insurance and asks to be switched to an alternative. Previously intolerant to high-intensity statin therapy. Will stop Vytorin and switch to Simvastatin 20mg  daily. Will recheck FLP and LFT's in 3 months. If above goal, could add back Zetia.   3. Varicose Veins on Lower Extremities - She has a known history of varicose veins and has required laser ablation in the past which is followed by Vascular Surgery. She does have trace edema on examination but we reviewed that she would not be an ideal candidate for diuretic therapy given her low-normal BP (102/84 during today's visit) and occasional weakness which could possibly be due to orthostasis. Recommended the use of compression stockings on a daily basis. She does have scheduled follow-up with Vascular next month.    Medication Adjustments/Labs and Tests  Ordered: Current medicines are reviewed at length with the patient today.  Concerns regarding medicines are outlined above.  Medication changes, Labs and Tests ordered today are listed in the Patient Instructions below. Patient Instructions  Medication Instructions:  Your physician has recommended you make the following change in your medication:  Stop Taking Vytorin Start Taking Simvastatin 20 mg Daily   If you need a refill on your cardiac medications before your next appointment, please call your pharmacy.   Lab work: Your physician recommends that you return for lab work in: 6 weeks   If you have labs (blood work) drawn today and your tests are completely normal, you will receive your results only by: Marland Kitchen MyChart Message (if you have MyChart) OR . A paper copy in the mail If you have any lab test that is abnormal or we need to change your treatment, we will call you to review the results.  Testing/Procedures: NONE   Follow-Up: At [facility], you and your health needs are our priority.  As part of our continuing mission to provide you with exceptional heart care, we have created designated Provider Care Teams.  These Care Teams include your primary Cardiologist (physician) and Advanced Practice Providers (APPs -  Physician Assistants and Nurse Practitioners) who all work together to provide you with the care you need, when you need it. You will need a follow up appointment in 1 years.  Please call our office 2 months in advance to schedule this appointment.  You may see Kate Sable, MD or one of the following Advanced Practice Providers on your designated Care Team:   Bernerd Pho, PA-C [facility]) . Ermalinda Barrios, PA-C ([place])  Any Other Special Instructions Will Be Listed Below (If Applicable). Thank you for choosing [facility]!    Signed, Erma Heritage, PA-C  11/10/2018 4:55 PM    [place] [address] [place], [place] [address] Phone: [phone]

## 2018-12-01 ENCOUNTER — Encounter: Payer: Self-pay | Admitting: Hematology and Oncology

## 2018-12-01 DIAGNOSIS — Z853 Personal history of malignant neoplasm of breast: Secondary | ICD-10-CM | POA: Diagnosis not present

## 2018-12-21 ENCOUNTER — Encounter (HOSPITAL_COMMUNITY): Payer: Medicare Other

## 2018-12-21 ENCOUNTER — Encounter: Payer: Medicare Other | Admitting: Vascular Surgery

## 2019-01-01 DIAGNOSIS — H401411 Capsular glaucoma with pseudoexfoliation of lens, right eye, mild stage: Secondary | ICD-10-CM | POA: Diagnosis not present

## 2019-01-01 DIAGNOSIS — H401423 Capsular glaucoma with pseudoexfoliation of lens, left eye, severe stage: Secondary | ICD-10-CM | POA: Diagnosis not present

## 2019-01-13 ENCOUNTER — Telehealth: Payer: Self-pay

## 2019-01-13 NOTE — Telephone Encounter (Signed)
Returned patient's call. Patient inquiring about results of recent mammogram on 12/01/2018.  Nurse reviewed results, patient voiced understanding.  No further needs at this time.

## 2019-02-01 DIAGNOSIS — H401423 Capsular glaucoma with pseudoexfoliation of lens, left eye, severe stage: Secondary | ICD-10-CM | POA: Diagnosis not present

## 2019-02-01 DIAGNOSIS — H401411 Capsular glaucoma with pseudoexfoliation of lens, right eye, mild stage: Secondary | ICD-10-CM | POA: Diagnosis not present

## 2019-02-09 ENCOUNTER — Telehealth: Payer: Self-pay | Admitting: Cardiovascular Disease

## 2019-02-09 NOTE — Telephone Encounter (Signed)
Requesting samples of Xarelto 20mg/tg    °

## 2019-02-09 NOTE — Telephone Encounter (Signed)
Pt made aware that samples are in front office,.

## 2019-02-12 ENCOUNTER — Encounter: Payer: Medicare Other | Admitting: Vascular Surgery

## 2019-02-12 ENCOUNTER — Encounter (HOSPITAL_COMMUNITY): Payer: Medicare Other

## 2019-02-24 DIAGNOSIS — M170 Bilateral primary osteoarthritis of knee: Secondary | ICD-10-CM | POA: Diagnosis not present

## 2019-02-24 DIAGNOSIS — M1712 Unilateral primary osteoarthritis, left knee: Secondary | ICD-10-CM | POA: Diagnosis not present

## 2019-02-24 DIAGNOSIS — M25551 Pain in right hip: Secondary | ICD-10-CM | POA: Diagnosis not present

## 2019-02-24 DIAGNOSIS — M1711 Unilateral primary osteoarthritis, right knee: Secondary | ICD-10-CM | POA: Diagnosis not present

## 2019-02-25 ENCOUNTER — Encounter (HOSPITAL_COMMUNITY): Payer: Medicare Other

## 2019-02-25 ENCOUNTER — Encounter: Payer: Medicare Other | Admitting: Vascular Surgery

## 2019-02-26 ENCOUNTER — Encounter (HOSPITAL_COMMUNITY): Payer: Medicare Other

## 2019-02-26 ENCOUNTER — Encounter: Payer: Medicare Other | Admitting: Vascular Surgery

## 2019-03-01 DIAGNOSIS — H401411 Capsular glaucoma with pseudoexfoliation of lens, right eye, mild stage: Secondary | ICD-10-CM | POA: Diagnosis not present

## 2019-03-01 DIAGNOSIS — H401423 Capsular glaucoma with pseudoexfoliation of lens, left eye, severe stage: Secondary | ICD-10-CM | POA: Diagnosis not present

## 2019-04-28 DIAGNOSIS — D485 Neoplasm of uncertain behavior of skin: Secondary | ICD-10-CM | POA: Diagnosis not present

## 2019-04-28 DIAGNOSIS — C44111 Basal cell carcinoma of skin of unspecified eyelid, including canthus: Secondary | ICD-10-CM | POA: Diagnosis not present

## 2019-04-28 DIAGNOSIS — L570 Actinic keratosis: Secondary | ICD-10-CM | POA: Diagnosis not present

## 2019-05-06 DIAGNOSIS — C441121 Basal cell carcinoma of skin of right upper eyelid, including canthus: Secondary | ICD-10-CM | POA: Diagnosis not present

## 2019-05-25 DIAGNOSIS — M1711 Unilateral primary osteoarthritis, right knee: Secondary | ICD-10-CM | POA: Diagnosis not present

## 2019-06-03 DIAGNOSIS — M1711 Unilateral primary osteoarthritis, right knee: Secondary | ICD-10-CM | POA: Diagnosis not present

## 2019-06-10 DIAGNOSIS — M1711 Unilateral primary osteoarthritis, right knee: Secondary | ICD-10-CM | POA: Diagnosis not present

## 2019-06-14 DIAGNOSIS — M47816 Spondylosis without myelopathy or radiculopathy, lumbar region: Secondary | ICD-10-CM | POA: Diagnosis not present

## 2019-06-14 DIAGNOSIS — M9903 Segmental and somatic dysfunction of lumbar region: Secondary | ICD-10-CM | POA: Diagnosis not present

## 2019-06-14 DIAGNOSIS — M7061 Trochanteric bursitis, right hip: Secondary | ICD-10-CM | POA: Diagnosis not present

## 2019-06-14 DIAGNOSIS — M5441 Lumbago with sciatica, right side: Secondary | ICD-10-CM | POA: Diagnosis not present

## 2019-06-16 DIAGNOSIS — H401411 Capsular glaucoma with pseudoexfoliation of lens, right eye, mild stage: Secondary | ICD-10-CM | POA: Diagnosis not present

## 2019-06-16 DIAGNOSIS — H401423 Capsular glaucoma with pseudoexfoliation of lens, left eye, severe stage: Secondary | ICD-10-CM | POA: Diagnosis not present

## 2019-06-17 ENCOUNTER — Telehealth: Payer: Self-pay | Admitting: Cardiovascular Disease

## 2019-06-17 DIAGNOSIS — M7061 Trochanteric bursitis, right hip: Secondary | ICD-10-CM | POA: Diagnosis not present

## 2019-06-17 DIAGNOSIS — M9903 Segmental and somatic dysfunction of lumbar region: Secondary | ICD-10-CM | POA: Diagnosis not present

## 2019-06-17 DIAGNOSIS — M5441 Lumbago with sciatica, right side: Secondary | ICD-10-CM | POA: Diagnosis not present

## 2019-06-17 DIAGNOSIS — M47816 Spondylosis without myelopathy or radiculopathy, lumbar region: Secondary | ICD-10-CM | POA: Diagnosis not present

## 2019-06-17 MED ORDER — RIVAROXABAN 20 MG PO TABS: 20.0000 mg | ORAL_TABLET | Freq: Every day | ORAL | 0 refills | Status: DC

## 2019-06-17 NOTE — Telephone Encounter (Signed)
Patient notified samples ready for pick up.  Asked her to call when she arrives and nurse can bring out to her.  She verbalized understanding.

## 2019-06-17 NOTE — Telephone Encounter (Signed)
Asking for Evalina Field 20mg  samples Will be in [place] this afternoon

## 2019-06-24 DIAGNOSIS — M9903 Segmental and somatic dysfunction of lumbar region: Secondary | ICD-10-CM | POA: Diagnosis not present

## 2019-06-24 DIAGNOSIS — M47816 Spondylosis without myelopathy or radiculopathy, lumbar region: Secondary | ICD-10-CM | POA: Diagnosis not present

## 2019-06-24 DIAGNOSIS — M7061 Trochanteric bursitis, right hip: Secondary | ICD-10-CM | POA: Diagnosis not present

## 2019-06-24 DIAGNOSIS — M5441 Lumbago with sciatica, right side: Secondary | ICD-10-CM | POA: Diagnosis not present

## 2019-06-28 DIAGNOSIS — M7061 Trochanteric bursitis, right hip: Secondary | ICD-10-CM | POA: Diagnosis not present

## 2019-06-28 DIAGNOSIS — M9903 Segmental and somatic dysfunction of lumbar region: Secondary | ICD-10-CM | POA: Diagnosis not present

## 2019-06-28 DIAGNOSIS — M5441 Lumbago with sciatica, right side: Secondary | ICD-10-CM | POA: Diagnosis not present

## 2019-06-28 DIAGNOSIS — M47816 Spondylosis without myelopathy or radiculopathy, lumbar region: Secondary | ICD-10-CM | POA: Diagnosis not present

## 2019-07-05 ENCOUNTER — Telehealth: Payer: Self-pay | Admitting: Hematology and Oncology

## 2019-07-05 NOTE — Telephone Encounter (Signed)
Returned patient's phone call regarding rescheduling an appointment in November, left a voicemail.

## 2019-07-05 NOTE — Telephone Encounter (Signed)
Patient returned phone call regarding voicemail that was left, patient would like to reschedule November's follow up appointment until after her mammogram appointment which in in December. The new follow up date Is 12/21.

## 2019-07-13 ENCOUNTER — Other Ambulatory Visit: Payer: Self-pay

## 2019-07-13 ENCOUNTER — Encounter (INDEPENDENT_AMBULATORY_CARE_PROVIDER_SITE_OTHER): Payer: Self-pay | Admitting: Internal Medicine

## 2019-07-13 ENCOUNTER — Ambulatory Visit (INDEPENDENT_AMBULATORY_CARE_PROVIDER_SITE_OTHER): Payer: Medicare Other | Admitting: Internal Medicine

## 2019-07-13 VITALS — BP 118/80 | HR 66 | Temp 98.1°F | Resp 18 | Ht 67.0 in | Wt 181.7 lb

## 2019-07-13 DIAGNOSIS — K219 Gastro-esophageal reflux disease without esophagitis: Secondary | ICD-10-CM

## 2019-07-13 MED ORDER — FAMOTIDINE 20 MG PO TABS: 20.0000 mg | ORAL_TABLET | Freq: Every day | ORAL | Status: DC

## 2019-07-13 NOTE — Patient Instructions (Signed)
Please call office with progress report in 8 weeks 

## 2019-07-13 NOTE — Progress Notes (Signed)
Presenting complaint;  Follow-up for chronic GERD. Patient having throat symptoms.  Database and subjective:  Patient is 85 year old Caucasian female who has chronic GERD and was last seen in July 2019 with a first scheduled visit.  She says heartburn generally is well controlled with therapy but some nights she feels as if she has food or lump in her throat.  She said it may last for several minutes and goes away on its own.  She denies dysphagia with solids or liquids.  She eats her evening meal at least 4 hours before she goes to bed.  She says she has very good appetite.  He has gained 5 pounds since her last visit 1 year ago.  She is lots of fruits and vegetables.  She states bowels move daily or every other day.  She denies melena or rectal bleeding. She lives alone.  She does all the housework by herself.  Current Medications: Outpatient Encounter Medications as of 07/13/2019  Medication Sig  . acetaminophen (TYLENOL) 500 MG tablet Take 500 mg by mouth as needed for mild pain, moderate pain, fever or headache.  . ALPRAZolam (XANAX) 0.5 MG tablet Take 0.25 mg by mouth 3 (three) times daily as needed for anxiety. Order is 0.05 tid prn  . Cholecalciferol (VITAMIN D) 2000 UNITS tablet Take 2,000 Units by mouth daily.  Marland Kitchen docusate sodium (COLACE) 250 MG capsule Take 250 mg by mouth daily.  Marland Kitchen ezetimibe-simvastatin (VYTORIN) 10-20 MG tablet Take 1 tablet by mouth daily.  . metoprolol succinate (TOPROL-XL) 25 MG 24 hr tablet Take 12.5 mg by mouth daily.   . pantoprazole (PROTONIX) 40 MG tablet TAKE (1) TABLET BY MOUTH EACH MORNING.  . rivaroxaban (XARELTO) 20 MG TABS tablet Take 1 tablet (20 mg total) by mouth daily with supper.  . [DISCONTINUED] simvastatin (ZOCOR) 20 MG tablet Take 1 tablet (20 mg total) by mouth at bedtime.   No facility-administered encounter medications on file as of 07/13/2019.    Objective: Blood pressure 118/80, pulse 66, temperature 98.1 F (36.7 C), temperature  source Oral, resp. rate 18, height 5\' 7"  (1.702 m), weight 181 lb 11.2 oz (82.4 kg). Patient is alert and in no acute distress. She appears much younger than stated age. Conjunctiva is pink. Sclera is nonicteric Oropharyngeal mucosa is normal. No neck masses or thyromegaly noted. Cardiac exam does not appear to be irregular.  She has normal S1 and S2. No murmur or gallop noted. Lungs are clear to auscultation. Abdomen abdomen is full but soft and nontender with no organomegaly or masses. No LE edema or clubbing noted.   Assessment:  #1.  Chronic GERD.  Typical symptoms are well controlled with therapy which he is having nocturnal symptoms when she goes to bed at night.  Suspect your symptoms are due to GERD but it could also be due to postnasal discharge.  If he does not respond to nightly famotidine may consider further evaluation with barium study.  Plan:  Patient will continue pantoprazole 40 mg by mouth 30 minutes before breakfast daily. Famotidine OTC 20 mg by mouth daily at bedtime. Patient will call office with progress report in 8 weeks or earlier if necessary. Office visit in 1 year.

## 2019-07-15 ENCOUNTER — Telehealth: Payer: Self-pay | Admitting: Adult Health

## 2019-07-15 NOTE — Telephone Encounter (Signed)
Patient called stating that she was seeing a Breat cancer Doctor but he is retiring and she would like to speak with Anderson Malta to see what Doctor she could switch to. Please contact pt

## 2019-07-15 NOTE — Telephone Encounter (Signed)
Pt has breast cancer and had surgery 2016, Dr Excell Seltzer is retiring and now will see  Dr Marlou Starks in August.

## 2019-07-22 DIAGNOSIS — I459 Conduction disorder, unspecified: Secondary | ICD-10-CM | POA: Diagnosis not present

## 2019-07-22 DIAGNOSIS — K210 Gastro-esophageal reflux disease with esophagitis: Secondary | ICD-10-CM | POA: Diagnosis not present

## 2019-07-22 DIAGNOSIS — I951 Orthostatic hypotension: Secondary | ICD-10-CM | POA: Diagnosis not present

## 2019-07-22 DIAGNOSIS — I10 Essential (primary) hypertension: Secondary | ICD-10-CM | POA: Diagnosis not present

## 2019-07-23 ENCOUNTER — Encounter: Payer: Self-pay | Admitting: Pulmonary Disease

## 2019-07-23 DIAGNOSIS — E782 Mixed hyperlipidemia: Secondary | ICD-10-CM | POA: Diagnosis not present

## 2019-07-23 DIAGNOSIS — H409 Unspecified glaucoma: Secondary | ICD-10-CM | POA: Diagnosis not present

## 2019-07-23 DIAGNOSIS — I10 Essential (primary) hypertension: Secondary | ICD-10-CM | POA: Diagnosis not present

## 2019-07-23 DIAGNOSIS — I951 Orthostatic hypotension: Secondary | ICD-10-CM | POA: Diagnosis not present

## 2019-07-23 DIAGNOSIS — R739 Hyperglycemia, unspecified: Secondary | ICD-10-CM | POA: Diagnosis not present

## 2019-07-23 DIAGNOSIS — I459 Conduction disorder, unspecified: Secondary | ICD-10-CM | POA: Diagnosis not present

## 2019-07-23 DIAGNOSIS — K210 Gastro-esophageal reflux disease with esophagitis: Secondary | ICD-10-CM | POA: Diagnosis not present

## 2019-07-23 DIAGNOSIS — I4891 Unspecified atrial fibrillation: Secondary | ICD-10-CM | POA: Diagnosis not present

## 2019-07-23 LAB — LIPID PANEL
Cholesterol: 137 (ref 0–200)
HDL: 45 (ref 35–70)
LDL Cholesterol: 73
Triglycerides: 97 (ref 40–160)

## 2019-07-23 LAB — CBC AND DIFFERENTIAL
HCT: 43 (ref 36–46)
Hemoglobin: 13.7 (ref 12.0–16.0)
Neutrophils Absolute: 3
WBC: 5.6

## 2019-07-23 LAB — HEPATIC FUNCTION PANEL
ALT: 16 (ref 7–35)
AST: 17 (ref 13–35)
Alkaline Phosphatase: 74 (ref 25–125)
Bilirubin, Direct: 0.3
Bilirubin, Total: 0.3

## 2019-07-23 LAB — CBC: RBC: 4.7 (ref 3.87–5.11)

## 2019-07-23 LAB — BASIC METABOLIC PANEL
Creatinine: 0.7 (ref <=1.1)
Glucose: 101

## 2019-07-23 LAB — COMPREHENSIVE METABOLIC PANEL
Albumin: 4.1 (ref 3.5–5.0)
Calcium: 10.1 (ref 8.7–10.7)
Globulin: 2.4

## 2019-07-23 LAB — VITAMIN B12: Vitamin B-12: 270

## 2019-08-09 DIAGNOSIS — C50412 Malignant neoplasm of upper-outer quadrant of left female breast: Secondary | ICD-10-CM | POA: Diagnosis not present

## 2019-08-13 ENCOUNTER — Other Ambulatory Visit: Payer: Self-pay

## 2019-08-13 MED ORDER — RIVAROXABAN 20 MG PO TABS: 20.0000 mg | ORAL_TABLET | Freq: Every day | ORAL | 3 refills | Status: DC

## 2019-08-13 NOTE — Telephone Encounter (Signed)
REFILL SENT FOR XARELTO.

## 2019-09-01 DIAGNOSIS — M79604 Pain in right leg: Secondary | ICD-10-CM | POA: Diagnosis not present

## 2019-09-16 DIAGNOSIS — M5136 Other intervertebral disc degeneration, lumbar region: Secondary | ICD-10-CM | POA: Diagnosis not present

## 2019-10-04 ENCOUNTER — Telehealth: Payer: Self-pay

## 2019-10-04 NOTE — Telephone Encounter (Signed)
Pt reports new lump to left breast. Denies any redness or swelling to area.  Lump minimal in size, pt states, "I do have to hunt for it, but it is there."  Denies any pain to site.   Pt due for diagnostic mammogram in 11/2019 - Pt voiced request to have lump examined before additional imaging.   RN reschedule follow up appointment in December, for this week.  RN educated patient MD can evaluate and based on recommendations proceed with additional imaging or scheduled mammogram in December.  Pt voiced great appreciation, no further needs.

## 2019-10-05 DIAGNOSIS — Z23 Encounter for immunization: Secondary | ICD-10-CM | POA: Diagnosis not present

## 2019-10-06 NOTE — Progress Notes (Signed)
Patient Care Team: Sinda Du, MD as PCP - General (Internal Medicine) Herminio Commons, MD as PCP - Cardiology (Cardiology) Rogene Houston, MD (Gastroenterology) Excell Seltzer, MD as Consulting Physician (General Surgery) Nicholas Lose, MD as Consulting Physician (Hematology and Oncology) Arloa Koh, MD (Inactive) as Consulting Physician (Radiation Oncology) Sylvan Cheese, NP as Nurse Practitioner (Hematology and Oncology)  DIAGNOSIS:    ICD-10-CM   1. Malignant neoplasm of upper-outer quadrant of left breast in female, estrogen receptor positive ([place])  C50.412 MM DIAG BREAST TOMO BILATERAL   Z17.0     SUMMARY OF ONCOLOGIC HISTORY: Oncology History  Breast cancer of upper-outer quadrant of left female breast ([place])  11/02/2015 Mammogram   screening mammogram category B breast density, asymmetry with indistinct margin in the left breast upper outer quadrant posterior depth, 2 internal mammary lymph nodes, 6 mm at 2:00 position axillary negative   11/21/2015 Initial Diagnosis   left breast biopsy: Invasive ductal carcinoma with associated low-grade DCIS with microcalcifications focally involving a papilloma, ER/PR 100%, HER-2 negative ratio 1.22   12/13/2015 Surgery   Left lumpectomy: Invasive ductal carcinoma 0.4 cm, DCIS 1.6 cm, clear margins, ER 100%, PR 100%, HER-2 negative ratio 1.22, Ki-67 5% T1 N0 stage IA   12/21/2015 - 03/20/2016 Anti-estrogen oral therapy   Tamoxifen 20 mg daily decreased to 10 mg daily, unable to tolerate it.     CHIEF COMPLIANT: Surveillance of breast cancer  INTERVAL HISTORY: Hailey Graham is a 85 y.o. with above-mentioned history of left breast cancer treated with lumpectomy and who could not tolerate tamoxifen. She is currently on surveillance. Mammogram on 12/01/18 showed no evidence of malignancy bilaterally. She presents to the clinic today for annual follow-up.  She came in today urgently complaining that there is  a small nodule at the end of the surgical scar and she is worried about recurrent breast cancer.  REVIEW OF SYSTEMS:   Constitutional: Denies fevers, chills or abnormal weight loss Eyes: Denies blurriness of vision Ears, nose, mouth, throat, and face: Denies mucositis or sore throat Respiratory: Denies cough, dyspnea or wheezes Cardiovascular: Denies palpitation, chest discomfort Gastrointestinal: Denies nausea, heartburn or change in bowel habits Skin: Denies abnormal skin rashes Lymphatics: Denies new lymphadenopathy or easy bruising Neurological: Denies numbness, tingling or new weaknesses Behavioral/Psych: Mood is stable, no new changes  Extremities: No lower extremity edema Breast: Small skin nodule at the lower end of the breast surgical scar in the left breast. All other systems were reviewed with the patient and are negative.  I have reviewed the past medical history, past surgical history, social history and family history with the patient and they are unchanged from previous note.  ALLERGIES:  is allergic to brimonidine; codeine; morphine; and sulfa drugs cross reactors.  MEDICATIONS:  Current Outpatient Medications  Medication Sig Dispense Refill  . acetaminophen (TYLENOL) 500 MG tablet Take 500 mg by mouth as needed for mild pain, moderate pain, fever or headache.    . ALPRAZolam (XANAX) 0.5 MG tablet Take 0.25 mg by mouth 3 (three) times daily as needed for anxiety. Order is 0.05 tid prn    . Cholecalciferol (VITAMIN D) 2000 UNITS tablet Take 2,000 Units by mouth daily.    Marland Kitchen docusate sodium (COLACE) 250 MG capsule Take 250 mg by mouth daily.    Marland Kitchen ezetimibe-simvastatin (VYTORIN) 10-20 MG tablet Take 1 tablet by mouth daily.    . famotidine (PEPCID) 20 MG tablet Take 1 tablet (20 mg total) by mouth at  bedtime.    . metoprolol succinate (TOPROL-XL) 25 MG 24 hr tablet Take 12.5 mg by mouth daily.   11  . pantoprazole (PROTONIX) 40 MG tablet TAKE (1) TABLET BY MOUTH EACH  MORNING. 90 tablet 2  . rivaroxaban (XARELTO) 20 MG TABS tablet Take 1 tablet (20 mg total) by mouth daily with supper. 90 tablet 3   No current facility-administered medications for this visit.     PHYSICAL EXAMINATION: ECOG PERFORMANCE STATUS: 1 - Symptomatic but completely ambulatory  Vitals:   10/07/19 1508  BP: (!) 150/75  Pulse: 66  Resp: 17  Temp: 98.5 F (36.9 C)  SpO2: 100%   Filed Weights   10/07/19 1508  Weight: 183 lb 9.6 oz (83.3 kg)    GENERAL: alert, no distress and comfortable SKIN: skin color, texture, turgor are normal, no rashes or significant lesions EYES: normal, Conjunctiva are pink and non-injected, sclera clear OROPHARYNX: no exudate, no erythema and lips, buccal mucosa, and tongue normal  NECK: supple, thyroid normal size, non-tender, without nodularity LYMPH: no palpable lymphadenopathy in the cervical, axillary or inguinal LUNGS: clear to auscultation and percussion with normal breathing effort HEART: regular rate & rhythm and no murmurs and no lower extremity edema ABDOMEN: abdomen soft, non-tender and normal bowel sounds MUSCULOSKELETAL: no cyanosis of digits and no clubbing  NEURO: alert & oriented x 3 with fluent speech, no focal motor/sensory deficits EXTREMITIES: No lower extremity edema BREAST: Small palpable nodule at the lower end of the surgical scar in the left breast.  There is also a small nodule on the proximal aspect of the surgical scar which is unchanged from before. (exam performed in the presence of a chaperone)     LABORATORY DATA:  I have reviewed the data as listed CMP Latest Ref Rng & Units 11/26/2016 12/12/2015 11/29/2015  Glucose 65 - 99 mg/dL 78 108(H) 87  BUN 8 - 27 mg/dL 21 24(H) 21.9  Creatinine 0.57 - 1.00 mg/dL 0.73 0.81 0.8  Sodium 134 - 144 mmol/L 141 142 142  Potassium 3.5 - 5.2 mmol/L 4.4 4.3 3.9  Chloride 96 - 106 mmol/L 103 109 -  CO2 18 - 29 mmol/L 25 28 23   Calcium 8.7 - 10.3 mg/dL 9.7 9.9 9.9  Total  Protein 6.4 - 8.3 g/dL - - 7.0  Total Bilirubin 0.20 - 1.20 mg/dL - - 0.43  Alkaline Phos 40 - 150 U/L - - 79  AST 5 - 34 U/L - - 15  ALT 0 - 55 U/L - - 12    Lab Results  Component Value Date   WBC 6.0 11/26/2016   HGB 13.5 11/26/2016   HCT 40.4 11/26/2016   MCV 89 11/26/2016   PLT 215 12/12/2015   NEUTROABS 3.3 11/29/2015    ASSESSMENT & PLAN:  Breast cancer of upper-outer quadrant of left female breast ([place]) Left lumpectomy 12/13/2015: Invasive ductal carcinoma 0.4 cm, DCIS 1.6 cm, clear margins, ER 100%, PR 100%, HER-2 negative ratio 1.22, Ki-67 5% T1 N0 stage IA Prognosis:I anticipate her risk of relapse in the next 5 years would be around 10-12% and antiestrogen therapy would decrease this down to 5%.  Tamoxifen started 12/21/2015 discontinued 03/20/2016 (for severe hot flashes, night sweats)  Breast Cancer Surveillance: 1. Breast exam  10/07/2019: Small palpable skin nodule the surgical scar in the left breast Also tenderness in the right breast. We will plan to obtain mammograms bilaterally. I sent a message to Dr. Donne Hazel to see the patient for follow-up and  to consider doing a biopsy in his office.  2. Mammogram  12/01/2018 at [facility]: Benign, density B  If the results of the biopsy are negative then we can see her back in 1 year    Orders Placed This Encounter  Procedures  . MM DIAG BREAST TOMO BILATERAL    Standing Status:   Future    Standing Expiration Date:   10/06/2020    Order Specific Question:   Reason for Exam (SYMPTOM  OR DIAGNOSIS REQUIRED)    Answer:   New onset of skin nodularity in the left breast surgical scar, right breast tenderness as well    Order Specific Question:   Preferred imaging location?    Answer:   External    Comments:   [name]   The patient has a good understanding of the overall plan. she agrees with it. she will call with any problems that may develop before the next visit here.  Nicholas Lose, MD 10/07/2019  Julious Oka  Dorshimer am acting as scribe for Dr. Nicholas Lose.  I have reviewed the above documentation for accuracy and completeness, and I agree with the above.

## 2019-10-07 ENCOUNTER — Telehealth: Payer: Self-pay

## 2019-10-07 ENCOUNTER — Inpatient Hospital Stay: Payer: Medicare Other | Attending: Hematology and Oncology | Admitting: Hematology and Oncology

## 2019-10-07 ENCOUNTER — Other Ambulatory Visit: Payer: Self-pay

## 2019-10-07 DIAGNOSIS — Z170 Estrogen receptor positive status [ER+]: Secondary | ICD-10-CM

## 2019-10-07 DIAGNOSIS — C50412 Malignant neoplasm of upper-outer quadrant of left female breast: Secondary | ICD-10-CM

## 2019-10-07 DIAGNOSIS — Z853 Personal history of malignant neoplasm of breast: Secondary | ICD-10-CM | POA: Insufficient documentation

## 2019-10-07 DIAGNOSIS — N63 Unspecified lump in unspecified breast: Secondary | ICD-10-CM | POA: Insufficient documentation

## 2019-10-07 LAB — HM MAMMOGRAPHY: HM Mammogram: NORMAL (ref 0–4)

## 2019-10-07 NOTE — Telephone Encounter (Signed)
Pt scheduled for dx mammogram @ [name] for 10/16 @ 10:30am.  Left message for patient, Karena Addison contact as well for appointment update.

## 2019-10-07 NOTE — Assessment & Plan Note (Signed)
Left lumpectomy 12/13/2015: Invasive ductal carcinoma 0.4 cm, DCIS 1.6 cm, clear margins, ER 100%, PR 100%, HER-2 negative ratio 1.22, Ki-67 5% T1 N0 stage IA Prognosis:I anticipate her risk of relapse in the next 5 years would be around 10-12% and antiestrogen therapy would decrease this down to 5%.  Tamoxifen started 12/21/2015 discontinued 03/20/2016 (for severe hot flashes, night sweats)  Breast Cancer Surveillance: 1. Breast exam  10/07/2019: Small palpable skin nodule the surgical scar in the left breast Also tenderness in the right breast. We will plan to obtain mammograms bilaterally. I sent a message to Dr. Donne Hazel to see the patient for follow-up and to consider doing a biopsy in his office.  2. Mammogram  12/01/2018 at [facility]: Benign, density B  If the results of the biopsy are negative then we can see her back in 1 year

## 2019-10-08 ENCOUNTER — Telehealth: Payer: Self-pay | Admitting: Hematology and Oncology

## 2019-10-08 DIAGNOSIS — N6323 Unspecified lump in the left breast, lower outer quadrant: Secondary | ICD-10-CM | POA: Diagnosis not present

## 2019-10-08 DIAGNOSIS — N6459 Other signs and symptoms in breast: Secondary | ICD-10-CM | POA: Diagnosis not present

## 2019-10-08 NOTE — Telephone Encounter (Signed)
I left a msg with patient

## 2019-10-12 DIAGNOSIS — Z23 Encounter for immunization: Secondary | ICD-10-CM | POA: Diagnosis not present

## 2019-10-14 ENCOUNTER — Other Ambulatory Visit: Payer: Self-pay | Admitting: General Surgery

## 2019-10-14 DIAGNOSIS — Z853 Personal history of malignant neoplasm of breast: Secondary | ICD-10-CM | POA: Diagnosis not present

## 2019-10-14 DIAGNOSIS — L910 Hypertrophic scar: Secondary | ICD-10-CM | POA: Diagnosis not present

## 2019-10-14 DIAGNOSIS — C50412 Malignant neoplasm of upper-outer quadrant of left female breast: Secondary | ICD-10-CM | POA: Diagnosis not present

## 2019-10-14 DIAGNOSIS — L905 Scar conditions and fibrosis of skin: Secondary | ICD-10-CM | POA: Diagnosis not present

## 2019-10-18 DIAGNOSIS — I4891 Unspecified atrial fibrillation: Secondary | ICD-10-CM | POA: Diagnosis not present

## 2019-10-18 DIAGNOSIS — I10 Essential (primary) hypertension: Secondary | ICD-10-CM | POA: Diagnosis not present

## 2019-10-18 DIAGNOSIS — M545 Low back pain: Secondary | ICD-10-CM | POA: Diagnosis not present

## 2019-10-19 ENCOUNTER — Other Ambulatory Visit (HOSPITAL_COMMUNITY): Payer: Self-pay | Admitting: Pulmonary Disease

## 2019-10-19 ENCOUNTER — Other Ambulatory Visit: Payer: Self-pay | Admitting: Pulmonary Disease

## 2019-10-19 DIAGNOSIS — M79604 Pain in right leg: Secondary | ICD-10-CM

## 2019-10-19 DIAGNOSIS — M5416 Radiculopathy, lumbar region: Secondary | ICD-10-CM | POA: Diagnosis not present

## 2019-10-20 DIAGNOSIS — H401411 Capsular glaucoma with pseudoexfoliation of lens, right eye, mild stage: Secondary | ICD-10-CM | POA: Diagnosis not present

## 2019-10-20 DIAGNOSIS — H401423 Capsular glaucoma with pseudoexfoliation of lens, left eye, severe stage: Secondary | ICD-10-CM | POA: Diagnosis not present

## 2019-10-21 ENCOUNTER — Other Ambulatory Visit (HOSPITAL_COMMUNITY): Payer: Self-pay | Admitting: Pulmonary Disease

## 2019-10-21 DIAGNOSIS — R609 Edema, unspecified: Secondary | ICD-10-CM

## 2019-10-22 ENCOUNTER — Other Ambulatory Visit: Payer: Self-pay

## 2019-10-22 ENCOUNTER — Ambulatory Visit (HOSPITAL_COMMUNITY)
Admission: RE | Admit: 2019-10-22 | Discharge: 2019-10-22 | Disposition: A | Discharge status: Home / Self Care | Payer: Medicare Other | Source: Ambulatory Visit | Attending: Pulmonary Disease | Admitting: Pulmonary Disease

## 2019-10-22 ENCOUNTER — Ambulatory Visit (HOSPITAL_BASED_OUTPATIENT_CLINIC_OR_DEPARTMENT_OTHER)
Admission: RE | Admit: 2019-10-22 | Discharge: 2019-10-22 | Disposition: A | Discharge status: Home / Self Care | Payer: Medicare Other | Source: Ambulatory Visit | Attending: Pulmonary Disease | Admitting: Pulmonary Disease

## 2019-10-22 DIAGNOSIS — E785 Hyperlipidemia, unspecified: Secondary | ICD-10-CM | POA: Insufficient documentation

## 2019-10-22 DIAGNOSIS — K219 Gastro-esophageal reflux disease without esophagitis: Secondary | ICD-10-CM | POA: Insufficient documentation

## 2019-10-22 DIAGNOSIS — I4891 Unspecified atrial fibrillation: Secondary | ICD-10-CM | POA: Insufficient documentation

## 2019-10-22 DIAGNOSIS — Z853 Personal history of malignant neoplasm of breast: Secondary | ICD-10-CM | POA: Insufficient documentation

## 2019-10-22 DIAGNOSIS — Z7901 Long term (current) use of anticoagulants: Secondary | ICD-10-CM | POA: Diagnosis not present

## 2019-10-22 DIAGNOSIS — R609 Edema, unspecified: Secondary | ICD-10-CM | POA: Diagnosis not present

## 2019-10-22 DIAGNOSIS — M79604 Pain in right leg: Secondary | ICD-10-CM | POA: Insufficient documentation

## 2019-10-22 DIAGNOSIS — I351 Nonrheumatic aortic (valve) insufficiency: Secondary | ICD-10-CM | POA: Insufficient documentation

## 2019-10-22 DIAGNOSIS — M79661 Pain in right lower leg: Secondary | ICD-10-CM | POA: Diagnosis not present

## 2019-10-22 NOTE — Progress Notes (Signed)
*  PRELIMINARY RESULTS* Echocardiogram 2D Echocardiogram has been performed.  Hailey Graham 10/22/2019, 1:12 PM

## 2019-11-01 ENCOUNTER — Other Ambulatory Visit (HOSPITAL_COMMUNITY): Payer: Medicare Other

## 2019-11-01 DIAGNOSIS — L821 Other seborrheic keratosis: Secondary | ICD-10-CM | POA: Diagnosis not present

## 2019-11-01 DIAGNOSIS — L570 Actinic keratosis: Secondary | ICD-10-CM | POA: Diagnosis not present

## 2019-11-01 DIAGNOSIS — Z85828 Personal history of other malignant neoplasm of skin: Secondary | ICD-10-CM | POA: Diagnosis not present

## 2019-11-02 ENCOUNTER — Ambulatory Visit (INDEPENDENT_AMBULATORY_CARE_PROVIDER_SITE_OTHER): Payer: Medicare Other | Admitting: Family Medicine

## 2019-11-02 ENCOUNTER — Other Ambulatory Visit: Payer: Self-pay

## 2019-11-02 ENCOUNTER — Encounter: Payer: Self-pay | Admitting: Family Medicine

## 2019-11-02 VITALS — BP 125/70 | HR 67 | Temp 98.3°F | Ht 67.0 in | Wt 184.6 lb

## 2019-11-02 DIAGNOSIS — K219 Gastro-esophageal reflux disease without esophagitis: Secondary | ICD-10-CM | POA: Diagnosis not present

## 2019-11-02 DIAGNOSIS — I83893 Varicose veins of bilateral lower extremities with other complications: Secondary | ICD-10-CM

## 2019-11-02 DIAGNOSIS — F418 Other specified anxiety disorders: Secondary | ICD-10-CM

## 2019-11-02 DIAGNOSIS — E785 Hyperlipidemia, unspecified: Secondary | ICD-10-CM | POA: Diagnosis not present

## 2019-11-02 DIAGNOSIS — Z780 Asymptomatic menopausal state: Secondary | ICD-10-CM

## 2019-11-02 DIAGNOSIS — I480 Paroxysmal atrial fibrillation: Secondary | ICD-10-CM

## 2019-11-02 DIAGNOSIS — Z7901 Long term (current) use of anticoagulants: Secondary | ICD-10-CM

## 2019-11-02 NOTE — Patient Instructions (Signed)
DEXA can for osteoporosis

## 2019-11-02 NOTE — Progress Notes (Signed)
New Patient Office Visit  Subjective:  Patient ID: Hailey Graham, female    DOB: Mar 15, 1928  Age: 85 y.o. MRN: [id number]  CC:  Chief Complaint  Patient presents with  . Establish Care  . Gait Problem    ballance issues   Fill Date ID   Written Drug Qty Days Prescriber Rx # Pharmacy Refill   Daily Dose* Pymt Type PMP    10/14/2019  1   10/13/2019  Alprazolam 0.5 MG Tablet  90.00  30 [address]   [id number]   Car ([id number])   0  3.00 LME  Medicare   [place]  03/15/2019  1   03/15/2019  Alprazolam 0.5 MG Tablet              HPI Hailey Graham presents for hyperlipidemia-ezetimibe/simvastatin, afib-xarelto/metoprololGERD-protonix/pepcid-taking medications with no recent changes,   Ortho-nerve blocks in the lower back has helped decrease pain symptoms but not completely resolved these concerns. Pt has completed therapy for strength and balance and does not feel she has improved her stability. Pt is using a cane today but has a walker available. Pt feels uneven ground is a concern.   Anxiety-uses Xanax prn since husband's death last year. Pt states 1-2 tablets /month maximum. Pt does not want to give up medication and declines a daily medication for anxiety.  Pt lives alone and feels safe in home.  H/o of breast cancer-recent concern for mass with mammogram and FNA showing scar tissue per pt-followed by oncology  H/o glaucoma-followed by eye specialist-pressures have remained normal since procedure.    Past Medical History:  Diagnosis Date  . Allergy   . Anxiety   . Arthritis   . Breast cancer of upper-outer quadrant of left female breast ([place]) 11/24/2015  . Breast cancer of upper-outer quadrant of left female breast ([place]) 11/24/2015  . Cataract   . Complication of anesthesia   . Depression   . Diverticulosis   . Gait abnormality   . Gastroesophageal reflux disease   . Glaucoma   . Hyperlipidemia    Lipid profile in 05/2010:149, 86, 48, 84  . Lung nodule    Right lower lobe; stable in 2010   . Paroxysmal atrial fibrillation (HCC)    Rate related right bundle branch block; normal EF on echo in 2006; normal stress nuclear-2006; mild RV hypokinesis on MRI;  RA and RV enlargement on CT in 9/06; Anticoagulation->discontinued in 2009  . Personal history of kidney stones   . PONV (postoperative nausea and vomiting)    Nausea    Past Surgical History:  Procedure Laterality Date  . ABDOMINAL HYSTERECTOMY  2202  . ANKLE SURGERY Right 2003    Fracture- rod  . APPENDECTOMY     as a teenager  . BREAST LUMPECTOMY WITH RADIOACTIVE SEED LOCALIZATION Left 12/13/2015   Procedure: BREAST LUMPECTOMY WITH RADIOACTIVE SEED LOCALIZATION;  Surgeon: Excell Seltzer, MD;  Location: [facility];  Service: General;  Laterality: Left;  . CATARACT EXTRACTION W/ INTRAOCULAR LENS IMPLANT Right 2014  . COLONOSCOPY W/ POLYPECTOMY    . ESOPHAGOGASTRODUODENOSCOPY    . GLAUCOMA SURGERY Right 2014  . HERNIA REPAIR Right    1990's  . ORIF ANKLE FRACTURE  2003   Right  . TONSILLECTOMY     age 68  . Varicose veins Right 2207    Family History  Problem Relation Age of Onset  . Healthy Mother   . Cerebral aneurysm Father   . Lung cancer Sister  Social History   Socioeconomic History  . Marital status: Married    Spouse name: Not on file  . Number of children: 0  . Years of education: 57  . Highest education level: Not on file  Occupational History  . Occupation: Retired  Scientific laboratory technician  . Financial resource strain: Not on file  . Food insecurity    Worry: Not on file    Inability: Not on file  . Transportation needs    Medical: Not on file    Non-medical: Not on file  Tobacco Use  . Smoking status: Never Smoker  . Smokeless tobacco: Never Used  Substance and Sexual Activity  . Alcohol use: No    Alcohol/week: 0.0 standard drinks  . Drug use: No  . Sexual activity: Never  Lifestyle  . Physical activity    Days per week: Not on file    Minutes per session: Not on file  . Stress: Not  on file  Relationships  . Social Herbalist on phone: Not on file    Gets together: Not on file    Attends religious service: Not on file    Active member of club or organization: Not on file    Attends meetings of clubs or organizations: Not on file    Relationship status: Not on file  . Intimate partner violence    Fear of current or ex partner: Not on file    Emotionally abused: Not on file    Physically abused: Not on file    Forced sexual activity: Not on file  Other Topics Concern  . Not on file  Social History Narrative   Lives at home with husband.   Right-handed.   No caffeine use.    ROS Review of Systems  Eyes:       Glaucoma  Gastrointestinal: Negative.   Musculoskeletal: Positive for arthralgias, back pain and gait problem.  Allergic/Immunologic: Positive for environmental allergies.    Objective:   Today's Vitals: BP 125/70 (BP Location: Left Arm, Patient Position: Sitting, Cuff Size: Normal)   Pulse 67   Temp 98.3 F (36.8 C) (Oral)   Ht 5\' 7"  (1.702 m)   Wt 184 lb 9.6 oz (83.7 kg)   SpO2 94%   BMI 28.91 kg/m   Physical Exam  Assessment & Plan:  1. Menopause - DG DXA FRACTURE ASSESSMENT; Future 2. Hyperlipidemia, unspecified hyperlipidemia type labwork reviewed-lipid panel and liver function normal  3. Varicose veins of bilateral lower extremities with other complications Elevate legs-at night and with sitting Reviewed doppler-normal 4. Gastroesophageal reflux disease without esophagitis protonix/pepcid  5. Paroxysmal atrial fibrillation (HCC) xalrelto  toprol-rate controlled-echo 2020 normal 6. Chronic anticoagulation   7. Situational anxiety Xanax-started after husband died-pt states she takes intermittently -d/w pt at length risk/benefit/side effects-pt understands this medication will not be written and options given to pt for daily medication for anxiety.  Pt declines medication change and understands no additional  prescriptions forXanax   will be written.   Outpatient Encounter Medications as of 11/02/2019  Medication Sig  . acetaminophen (TYLENOL) 500 MG tablet Take 500 mg by mouth as needed for mild pain, moderate pain, fever or headache.  . ALPRAZolam (XANAX) 0.5 MG tablet Take 0.25 mg by mouth 3 (three) times daily as needed for anxiety. Order is 0.05 tid prn  . Cholecalciferol (VITAMIN D) 2000 UNITS tablet Take 2,000 Units by mouth daily.  Marland Kitchen docusate sodium (COLACE) 250 MG capsule Take 250 mg by  mouth daily.  Marland Kitchen ezetimibe-simvastatin (VYTORIN) 10-20 MG tablet Take 1 tablet by mouth daily.  . famotidine (PEPCID) 20 MG tablet Take 1 tablet (20 mg total) by mouth at bedtime.  . metoprolol succinate (TOPROL-XL) 25 MG 24 hr tablet Take 12.5 mg by mouth daily.   . pantoprazole (PROTONIX) 40 MG tablet TAKE (1) TABLET BY MOUTH EACH MORNING.  . rivaroxaban (XARELTO) 20 MG TABS tablet Take 1 tablet (20 mg total) by mouth daily with supper.   No facility-administered encounter medications on file as of 11/02/2019.     Follow-up: DEXA, 6 month f/u  Rashaun Wichert Hannah Beat, MD

## 2019-11-05 ENCOUNTER — Other Ambulatory Visit: Payer: Self-pay | Admitting: Chiropractic Medicine

## 2019-11-05 ENCOUNTER — Other Ambulatory Visit (HOSPITAL_COMMUNITY): Payer: Self-pay | Admitting: Chiropractic Medicine

## 2019-11-05 DIAGNOSIS — M48062 Spinal stenosis, lumbar region with neurogenic claudication: Secondary | ICD-10-CM

## 2019-11-09 ENCOUNTER — Ambulatory Visit: Payer: Medicare Other | Admitting: Hematology and Oncology

## 2019-11-10 ENCOUNTER — Other Ambulatory Visit: Payer: Self-pay

## 2019-11-10 ENCOUNTER — Ambulatory Visit: Payer: Medicare Other | Admitting: Student

## 2019-11-10 ENCOUNTER — Encounter: Payer: Self-pay | Admitting: Student

## 2019-11-10 VITALS — BP 95/67 | HR 76 | Temp 97.3°F | Ht 67.0 in | Wt 183.0 lb

## 2019-11-10 DIAGNOSIS — I8393 Asymptomatic varicose veins of bilateral lower extremities: Secondary | ICD-10-CM

## 2019-11-10 DIAGNOSIS — I480 Paroxysmal atrial fibrillation: Secondary | ICD-10-CM | POA: Diagnosis not present

## 2019-11-10 DIAGNOSIS — E785 Hyperlipidemia, unspecified: Secondary | ICD-10-CM

## 2019-11-10 MED ORDER — METOPROLOL SUCCINATE ER 25 MG PO TB24: 12.5000 mg | ORAL_TABLET | Freq: Every day | ORAL | 3 refills | Status: DC

## 2019-11-10 NOTE — Patient Instructions (Signed)

## 2019-11-10 NOTE — Progress Notes (Signed)
Cardiology Office Note    Date:  11/10/2019   ID:  Hailey Graham, DOB August 16, 1928, MRN [id number]  PCP:  Maryruth Hancock, MD  Cardiologist: Kate Sable, MD    Chief Complaint  Patient presents with  . Follow-up    Annual Visit    History of Present Illness:    Hailey Graham is a 85 y.o. female with past medical history of paroxysmal atrial fibrillation, HLD, varicose veins, and history of breast cancer (s/p lumpectomy in 2016) who presents to the office today for her annual follow-up visit.  She was last examined by myself in 10/2018 and denied any recent chest pain, dyspnea on exertion or palpitations. She did report occasional episodes of weakness which would last for a few seconds and spontaneously resolve. She only reported a few episodes within the past year and had not been able to check her blood pressure or blood sugar when this would occur. She was maintaining normal sinus rhythm by examination and EKG, therefore she was continued on Toprol-XL 12.5 mg daily along with Xarelto 20 mg daily for anticoagulation. Vytorin was no longer covered by her insurance coverage and she had been intolerant to multiple statins in the past. Therefore she was switched back to Simvastatin 20 mg daily which she had previously tolerated.   In talking the patient today, she reports overall doing well from a cardiac perspective since her last office visit. She denies any recent chest pain or palpitations. No recent orthopnea, PND, or worsening dyspnea on exertion. She does have chronic edema secondary to varicose veins but denies any change in her symptoms or associated weight gain.  She has started to experience worsening pain down her right leg and is being followed by orthopedics.  She is scheduled for an MRI of her spine next week but says she would not wish to undergo surgery in the setting of her advanced age. She did receive a nerve block several weeks ago with some improvement in her  symptoms.   Past Medical History:  Diagnosis Date  . Allergy   . Anxiety   . Arthritis   . Breast cancer of upper-outer quadrant of left female breast ([place]) 11/24/2015  . Breast cancer of upper-outer quadrant of left female breast ([place]) 11/24/2015  . Cataract   . Complication of anesthesia   . Depression   . Diverticulosis   . Gait abnormality   . Gastroesophageal reflux disease   . Glaucoma   . Hyperlipidemia    Lipid profile in 05/2010:149, 86, 48, 84  . Lung nodule    Right lower lobe; stable in 2010  . Paroxysmal atrial fibrillation (HCC)    Rate related right bundle branch block; normal EF on echo in 2006; normal stress nuclear-2006; mild RV hypokinesis on MRI;  RA and RV enlargement on CT in 9/06; Anticoagulation->discontinued in 2009  . Personal history of kidney stones   . PONV (postoperative nausea and vomiting)    Nausea    Past Surgical History:  Procedure Laterality Date  . ABDOMINAL HYSTERECTOMY  2202  . ANKLE SURGERY Right 2003    Fracture- rod  . APPENDECTOMY     as a teenager  . BREAST LUMPECTOMY WITH RADIOACTIVE SEED LOCALIZATION Left 12/13/2015   Procedure: BREAST LUMPECTOMY WITH RADIOACTIVE SEED LOCALIZATION;  Surgeon: Excell Seltzer, MD;  Location: [facility];  Service: General;  Laterality: Left;  . CATARACT EXTRACTION W/ INTRAOCULAR LENS IMPLANT Right 2014  . COLONOSCOPY W/ POLYPECTOMY    .  ESOPHAGOGASTRODUODENOSCOPY    . GLAUCOMA SURGERY Right 2014  . HERNIA REPAIR Right    1990's  . ORIF ANKLE FRACTURE  2003   Right  . TONSILLECTOMY     age 76  . Varicose veins Right 2207    Current Medications: Outpatient Medications Prior to Visit  Medication Sig Dispense Refill  . acetaminophen (TYLENOL) 500 MG tablet Take 500 mg by mouth as needed for mild pain, moderate pain, fever or headache.    . ALPRAZolam (XANAX) 0.5 MG tablet Take 0.25 mg by mouth 3 (three) times daily as needed for anxiety. Order is 0.05 tid prn    . Cholecalciferol (VITAMIN D) 2000  UNITS tablet Take 2,000 Units by mouth daily.    . Cyanocobalamin (B-12) 1000 MCG TABS Take by mouth.    . docusate sodium (COLACE) 250 MG capsule Take 250 mg by mouth daily.    Marland Kitchen ezetimibe-simvastatin (VYTORIN) 10-20 MG tablet Take 1 tablet by mouth daily.    . famotidine (PEPCID) 20 MG tablet Take 1 tablet (20 mg total) by mouth at bedtime.    . pantoprazole (PROTONIX) 40 MG tablet TAKE (1) TABLET BY MOUTH EACH MORNING. 90 tablet 2  . rivaroxaban (XARELTO) 20 MG TABS tablet Take 1 tablet (20 mg total) by mouth daily with supper. 90 tablet 3  . metoprolol succinate (TOPROL-XL) 25 MG 24 hr tablet Take 12.5 mg by mouth daily.   11   No facility-administered medications prior to visit.      Allergies:   Brimonidine, Codeine, Morphine, and Sulfa drugs cross reactors   Social History   Socioeconomic History  . Marital status: Widowed    Spouse name: Not on file  . Number of children: 0  . Years of education: 67  . Highest education level: Not on file  Occupational History  . Occupation: Retired  Scientific laboratory technician  . Financial resource strain: Not on file  . Food insecurity    Worry: Not on file    Inability: Not on file  . Transportation needs    Medical: Not on file    Non-medical: Not on file  Tobacco Use  . Smoking status: Never Smoker  . Smokeless tobacco: Never Used  Substance and Sexual Activity  . Alcohol use: No    Alcohol/week: 0.0 standard drinks  . Drug use: No  . Sexual activity: Not Currently  Lifestyle  . Physical activity    Days per week: Not on file    Minutes per session: Not on file  . Stress: Not on file  Relationships  . Social Herbalist on phone: Not on file    Gets together: Not on file    Attends religious service: Not on file    Active member of club or organization: Not on file    Attends meetings of clubs or organizations: Not on file    Relationship status: Not on file  Other Topics Concern  . Not on file  Social History Narrative    Lives at home with husband.   Right-handed.   No caffeine use.     Family History:  The patient's family history includes Cerebral aneurysm in her father; Healthy in her mother; Lung cancer in her sister.   Review of Systems:   Please see the history of present illness.     General:  No chills, fever, night sweats or weight changes.  Cardiovascular:  No chest pain, dyspnea on exertion, orthopnea, palpitations, paroxysmal nocturnal dyspnea. Positive  for edema.  Dermatological: No rash, lesions/masses Respiratory: No cough, dyspnea Urologic: No hematuria, dysuria Abdominal:   No nausea, vomiting, diarrhea, bright red blood per rectum, melena, or hematemesis Neurologic:  No visual changes, wkns, changes in mental status.  All other systems reviewed and are otherwise negative except as noted above.   Physical Exam:    VS:  BP 95/67   Pulse 76   Temp (!) 97.3 F (36.3 C)   Ht 5\' 7"  (1.702 m)   Wt 183 lb (83 kg)   SpO2 97%   BMI 28.66 kg/m    General: Well developed, well nourished,female appearing in no acute distress. Head: Normocephalic, atraumatic, sclera non-icteric, no xanthomas, nares are without discharge.  Neck: No carotid bruits. JVD not elevated.  Lungs: Respirations regular and unlabored, without wheezes or rales.  Heart: Regular rate and rhythm. No S3 or S4.  No murmur, no rubs, or gallops appreciated. Abdomen: Soft, non-tender, non-distended with normoactive bowel sounds. No hepatomegaly. No rebound/guarding. No obvious abdominal masses. Msk:  Strength and tone appear normal for age. No joint deformities or effusions. Extremities: No clubbing or cyanosis. Trace edema bilaterally. Varicose veins noted.  Distal pedal pulses are 2+ bilaterally. Neuro: Alert and oriented X 3. Moves all extremities spontaneously. No focal deficits noted. Psych:  Responds to questions appropriately with a normal affect. Skin: No rashes or lesions noted  Wt Readings from Last 3  Encounters:  11/10/19 183 lb (83 kg)  11/02/19 184 lb 9.6 oz (83.7 kg)  10/07/19 183 lb 9.6 oz (83.3 kg)     Studies/Labs Reviewed:   EKG:  EKG is ordered today.  The ekg ordered today demonstrates NSR, HR 72 with known RBBB. No acute ST changes when compared to prior tracings.   Recent Labs: 07/23/2019: ALT 16; Creatinine 0.7; Hemoglobin 13.7   Lipid Panel    Component Value Date/Time   CHOL 137 07/23/2019   TRIG 97 07/23/2019   HDL 45 07/23/2019   CHOLHDL 2.9 11/12/2017 0845   VLDL 13 11/12/2017 0845   LDLCALC 73 07/23/2019    Additional studies/ records that were reviewed today include:   Echocardiogram: 10/22/2019 IMPRESSIONS    1. Left ventricular ejection fraction, by visual estimation, is 60 to 65%. The left ventricle has normal function. There is moderately increased left ventricular hypertrophy.  2. Left ventricular diastolic parameters are consistent with Grade I diastolic dysfunction (impaired relaxation).  3. Global right ventricle has normal systolic function.The right ventricular size is normal. No increase in right ventricular wall thickness.  4. Left atrial size was normal.  5. Right atrial size was normal.  6. The mitral valve is grossly normal. No evidence of mitral valve regurgitation.  7. The tricuspid valve is grossly normal. Tricuspid valve regurgitation is trivial.  8. The aortic valve is tricuspid. Aortic valve regurgitation is mild. Mild aortic valve sclerosis without stenosis.  9. The pulmonic valve was grossly normal. Pulmonic valve regurgitation is not visualized. 10. The inferior vena cava is normal in size with greater than 50% respiratory variability, suggesting right atrial pressure of 3 mmHg.  Assessment:    1. Paroxysmal atrial fibrillation (HCC)   2. Hyperlipidemia, unspecified hyperlipidemia type   3. Varicose veins of both lower extremities, unspecified whether complicated      Plan:   In order of problems listed above:  1.  Paroxysmal Atrial Fibrillation - she denies any recent palpitations but was unaware of her episodes of atrial fibrillation in the past. In NSR by  exam and EKG today.  - continue Toprol-XL 12.5mg  daily for rate-control. - she denies any evidence of active bleeding. Continue Xarelto 20mg  daily as creatinine clearance is greater than 50.  2. HLD - FLP in 06/2019 showed total cholesterol 137, triglycerides 97, HDL 45 and LDL 73. Continue current regimen with Vytorin. She was able to obtain enough for this year and has remained on this instead of switching to Simvastatin.   3. Varicose Veins - previously underwent laser ablation with no significant change in her symptoms. She has not been on diuretic therapy given her soft BP and history of orthostasis. Was encouraged to continue to use her compression stockings and elevate her legs as much as possible.    Medication Adjustments/Labs and Tests Ordered: Current medicines are reviewed at length with the patient today.  Concerns regarding medicines are outlined above.  Medication changes, Labs and Tests ordered today are listed in the Patient Instructions below. Patient Instructions  Medication Instructions:  Your physician recommends that you continue on your current medications as directed. Please refer to the Current Medication list given to you today.   Labwork: none  Testing/Procedures: none  Follow-Up: Your physician wants you to follow-up in: 1 year.  You will receive a reminder letter in the mail two months in advance. If you don't receive a letter, please call our office to schedule the follow-up appointment.   Any Other Special Instructions Will Be Listed Below (If Applicable).   If you need a refill on your cardiac medications before your next appointment, please call your pharmacy.    Signed, Erma Heritage, PA-C  11/10/2019 6:27 PM    [facility] [address] [place], [place] [address] Phone:  [phone] Fax: [phone]

## 2019-11-15 ENCOUNTER — Other Ambulatory Visit: Payer: Self-pay

## 2019-11-15 ENCOUNTER — Ambulatory Visit (HOSPITAL_COMMUNITY)
Admission: RE | Admit: 2019-11-15 | Discharge: 2019-11-15 | Disposition: A | Discharge status: Home / Self Care | Payer: Medicare Other | Source: Ambulatory Visit | Attending: Chiropractic Medicine | Admitting: Chiropractic Medicine

## 2019-11-15 DIAGNOSIS — M48062 Spinal stenosis, lumbar region with neurogenic claudication: Secondary | ICD-10-CM | POA: Insufficient documentation

## 2019-11-15 DIAGNOSIS — M545 Low back pain: Secondary | ICD-10-CM | POA: Diagnosis not present

## 2019-11-17 ENCOUNTER — Other Ambulatory Visit: Payer: Self-pay | Admitting: *Deleted

## 2019-11-17 MED ORDER — METOPROLOL SUCCINATE ER 25 MG PO TB24: 12.5000 mg | ORAL_TABLET | Freq: Every day | ORAL | 3 refills | Status: DC

## 2019-11-22 ENCOUNTER — Telehealth: Payer: Self-pay | Admitting: Cardiovascular Disease

## 2019-11-22 NOTE — Telephone Encounter (Signed)
Pt wanted to be sure her toprol was sent in for 90 tablets. Informed her it was filled with 90.

## 2019-12-02 ENCOUNTER — Telehealth: Payer: Self-pay | Admitting: Cardiovascular Disease

## 2019-12-02 NOTE — Telephone Encounter (Signed)
Patient calling the office for samples of medication:   1.  What medication and dosage are you requesting samples for?  XARELTO) 20 MG TABS    2.  Are you currently out of this medication?

## 2019-12-02 NOTE — Telephone Encounter (Signed)
I will forward to P CV PHARMD

## 2019-12-02 NOTE — Telephone Encounter (Addendum)
Patient unable to travel.Will ask our Environmental education officer to pick up.   I will notify R.Rodriquez-Guzman

## 2019-12-02 NOTE — Telephone Encounter (Signed)
Lyn do you know who deals with samples in [place] or [place]

## 2019-12-03 NOTE — Telephone Encounter (Signed)
Hailey Graham  I have the samples for her on my desk in [place]

## 2019-12-06 NOTE — Telephone Encounter (Signed)
Pt informed Xarelto samples are here in [place] office

## 2019-12-13 ENCOUNTER — Ambulatory Visit: Payer: Medicare Other | Admitting: Hematology and Oncology

## 2019-12-14 DIAGNOSIS — M5416 Radiculopathy, lumbar region: Secondary | ICD-10-CM | POA: Diagnosis not present

## 2019-12-15 ENCOUNTER — Other Ambulatory Visit (HOSPITAL_COMMUNITY): Payer: Self-pay | Admitting: Pulmonary Disease

## 2019-12-15 ENCOUNTER — Other Ambulatory Visit: Payer: Self-pay | Admitting: Pulmonary Disease

## 2019-12-15 DIAGNOSIS — R2689 Other abnormalities of gait and mobility: Secondary | ICD-10-CM

## 2019-12-20 ENCOUNTER — Other Ambulatory Visit: Payer: Self-pay

## 2019-12-20 ENCOUNTER — Ambulatory Visit (HOSPITAL_COMMUNITY)
Admission: RE | Admit: 2019-12-20 | Discharge: 2019-12-20 | Disposition: A | Discharge status: Home / Self Care | Payer: Medicare Other | Source: Ambulatory Visit | Attending: Pulmonary Disease | Admitting: Pulmonary Disease

## 2019-12-20 DIAGNOSIS — R2689 Other abnormalities of gait and mobility: Secondary | ICD-10-CM | POA: Diagnosis not present

## 2019-12-21 ENCOUNTER — Other Ambulatory Visit: Payer: Self-pay | Admitting: Pulmonary Disease

## 2019-12-21 ENCOUNTER — Other Ambulatory Visit (HOSPITAL_COMMUNITY): Payer: Self-pay | Admitting: Pulmonary Disease

## 2019-12-21 DIAGNOSIS — R42 Dizziness and giddiness: Secondary | ICD-10-CM

## 2019-12-22 ENCOUNTER — Ambulatory Visit (HOSPITAL_COMMUNITY): Payer: Medicare Other

## 2019-12-22 ENCOUNTER — Telehealth: Payer: Self-pay | Admitting: *Deleted

## 2019-12-22 MED ORDER — NYSTATIN-TRIAMCINOLONE 100000-0.1 UNIT/GM-% EX CREA: 1.0000 "application " | TOPICAL_CREAM | Freq: Two times a day (BID) | CUTANEOUS | 0 refills | Status: DC

## 2019-12-22 NOTE — Telephone Encounter (Signed)
Patient called requesting refill for nystatin/triamcinolone cream.

## 2019-12-22 NOTE — Telephone Encounter (Signed)
Refilled mytrex

## 2019-12-22 NOTE — Addendum Note (Signed)
Addended by: Derrek Monaco A on: 12/22/2019 01:46 PM   Modules accepted: Orders

## 2019-12-30 DIAGNOSIS — M1711 Unilateral primary osteoarthritis, right knee: Secondary | ICD-10-CM | POA: Diagnosis not present

## 2020-01-06 DIAGNOSIS — M1711 Unilateral primary osteoarthritis, right knee: Secondary | ICD-10-CM | POA: Diagnosis not present

## 2020-01-13 DIAGNOSIS — M1711 Unilateral primary osteoarthritis, right knee: Secondary | ICD-10-CM | POA: Diagnosis not present

## 2020-01-28 ENCOUNTER — Telehealth: Payer: Self-pay | Admitting: Cardiovascular Disease

## 2020-01-28 NOTE — Telephone Encounter (Signed)
Left message for patient that I have 2 bottles at front desk for her xarelto 20 mg lot; [id number], exp 10/21

## 2020-01-28 NOTE — Telephone Encounter (Signed)
Patient calling the office for samples of medication:   1.  What medication and dosage are you requesting samples for?   rivaroxaban (XARELTO) 20 MG TABS    2.  Are you currently out of this medication?

## 2020-02-15 ENCOUNTER — Telehealth: Payer: Self-pay | Admitting: Student

## 2020-02-15 NOTE — Telephone Encounter (Signed)
Pt's rivaroxaban (XARELTO) 20 MG TABS tablet [id number]  Is putting her in the doughnut hole w/ her other medications at the end of the year. She would like to know if there is another medication she could take.

## 2020-02-15 NOTE — Telephone Encounter (Signed)
Patient makes too much money for Patient Assistance. Please advise

## 2020-02-15 NOTE — Telephone Encounter (Signed)
    The only generic blood thinner is Coumadin. If she wants to switch to this, can arrange for a New Patient appointment with Edrick Oh. Would need to continue Xarelto in the interim until her appointment.   Signed, Erma Heritage, PA-C 02/15/2020, 6:40 PM Pager: [phone]

## 2020-02-16 ENCOUNTER — Telehealth: Payer: Self-pay | Admitting: Family Medicine

## 2020-02-16 NOTE — Telephone Encounter (Signed)
Patient states he husband was on coumadin and she is not interested in taking it. She states she is not sure what she will do but agreed to call me later.

## 2020-02-16 NOTE — Telephone Encounter (Signed)
Left a msg for the patient to inform her that I do not see where we referred her to Dermatology. She may need to call Dermatology to get them to do a prior auth. If they can not do the prior auth then she will need to scheduled an appt to be seen for them problem them we will refer her to Dermatology

## 2020-02-16 NOTE — Telephone Encounter (Signed)
Patient is calling and states she sees Dr. Tarri Glenn for dermatology in [place] and her insurance is requesting  A prior authorization for 9 visits. Please advise.

## 2020-02-17 ENCOUNTER — Ambulatory Visit: Payer: Medicare Other | Admitting: Family Medicine

## 2020-02-17 NOTE — Telephone Encounter (Signed)
Please advise if we can sent patient to Derm without seeing her.

## 2020-02-17 NOTE — Telephone Encounter (Signed)
We need to see the pt prior to a referral for derm since we have not seen her here for a derm problem. Insurance requires documentation for appropriate referral

## 2020-02-17 NOTE — Telephone Encounter (Signed)
Patient is calling back and requesting a dermatology referral. She states she has been seeing Dr. Tarri Glenn for years and would like to know if Dr. Holly Bodily could do that without her coming in. She states she has skin cancer that comes up here and there and it is not currently there for Dr. Holly Bodily to see.

## 2020-02-18 ENCOUNTER — Telehealth: Payer: Self-pay

## 2020-02-18 NOTE — Telephone Encounter (Signed)
Pt's insurance company suggested a refill program for her  Please call [phone]  Thanks renee

## 2020-02-18 NOTE — Telephone Encounter (Signed)
Returned pt. Call. I am going to look into a [employer] insurance program and giv ept information.

## 2020-02-21 ENCOUNTER — Other Ambulatory Visit: Payer: Self-pay

## 2020-02-21 ENCOUNTER — Encounter: Payer: Self-pay | Admitting: Family Medicine

## 2020-02-21 ENCOUNTER — Ambulatory Visit (INDEPENDENT_AMBULATORY_CARE_PROVIDER_SITE_OTHER): Payer: Medicare Other | Admitting: Family Medicine

## 2020-02-21 VITALS — BP 140/83 | HR 78 | Temp 97.8°F | Ht 67.0 in | Wt 184.2 lb

## 2020-02-21 DIAGNOSIS — R269 Unspecified abnormalities of gait and mobility: Secondary | ICD-10-CM

## 2020-02-21 DIAGNOSIS — C44319 Basal cell carcinoma of skin of other parts of face: Secondary | ICD-10-CM | POA: Diagnosis not present

## 2020-02-21 NOTE — Patient Instructions (Addendum)
  Derm referral for basal cell-referral made for continuation of regular follow ups   If you have lab work done today you will be contacted with your lab results within the next 2 weeks.  If you have not heard from [place] then please contact us. The fastest way to get your results is to register for My Chart.   IF you received an x-ray today, you will receive an invoice from [facility]. Please contact [facility] at [phone] with questions or concerns regarding your invoice.   IF you received labwork today, you will receive an invoice from [facility]. Please contact [facility] at [phone] with questions or concerns regarding your invoice.   Our billing staff will not be able to assist you with questions regarding bills from these companies.  You will be contacted with the lab results as soon as they are available. The fastest way to get your results is to activate your My Chart account. Instructions are located on the last page of this paperwork. If you have not heard from [place] regarding the results in 2 weeks, please contact this office.

## 2020-02-21 NOTE — Progress Notes (Signed)
Established Patient Office Visit  Subjective:  Patient ID: Hailey Graham, female    DOB: 09-29-1928  Age: 86 y.o. MRN: [id number]  CC:  Chief Complaint  Patient presents with  . Skin Cancer    Hx of skin cancer and need a new referral to see dermatology. Last area had removed was on right side of nose in 06/2019.   Marland Kitchen off balance    Patient stated she would like to know what is her next step to control the unbalance and unsteadness. She has been to see a Neurologist and had a ct scan which has not shown anything. Just need to know what is her next step or options    HPI Hailey Graham presents for gait imbalance-no recent evaluation-seen 5 years ago by physical therapy.  Pt seen by neurology in the past-MRI with no abnormality.  Pt with cataract/glaucoma surgery in the past.  Pt states currently working on new glasses with eye specialist.  Pt uses cane and has a walker (left over from her husband). Pt states rolling walker. Worsening symptoms on un-even ground  Pt sees derm for ongoing evaluation for basal cell-needs referral  Past Medical History:  Diagnosis Date  . Allergy   . Anxiety   . Arthritis   . Breast cancer of upper-outer quadrant of left female breast ([place]) 11/24/2015  . Breast cancer of upper-outer quadrant of left female breast ([place]) 11/24/2015  . Cataract   . Complication of anesthesia   . Depression   . Diverticulosis   . Gait abnormality   . Gastroesophageal reflux disease   . Glaucoma   . Hyperlipidemia    Lipid profile in 05/2010:149, 86, 48, 84  . Lung nodule    Right lower lobe; stable in 2010  . Paroxysmal atrial fibrillation (HCC)    Rate related right bundle branch block; normal EF on echo in 2006; normal stress nuclear-2006; mild RV hypokinesis on MRI;  RA and RV enlargement on CT in 9/06; Anticoagulation->discontinued in 2009  . Personal history of kidney stones   . PONV (postoperative nausea and vomiting)    Nausea    Past Surgical History:   Procedure Laterality Date  . ABDOMINAL HYSTERECTOMY  2202  . ANKLE SURGERY Right 2003    Fracture- rod  . APPENDECTOMY     as a teenager  . BREAST LUMPECTOMY WITH RADIOACTIVE SEED LOCALIZATION Left 12/13/2015   Procedure: BREAST LUMPECTOMY WITH RADIOACTIVE SEED LOCALIZATION;  Surgeon: Excell Seltzer, MD;  Location: [facility];  Service: General;  Laterality: Left;  . CATARACT EXTRACTION W/ INTRAOCULAR LENS IMPLANT Right 2014  . COLONOSCOPY W/ POLYPECTOMY    . ESOPHAGOGASTRODUODENOSCOPY    . GLAUCOMA SURGERY Right 2014  . HERNIA REPAIR Right    1990's  . ORIF ANKLE FRACTURE  2003   Right  . TONSILLECTOMY     age 42  . Varicose veins Right 2207    Family History  Problem Relation Age of Onset  . Healthy Mother   . Cerebral aneurysm Father   . Lung cancer Sister     Social History   Socioeconomic History  . Marital status: Widowed    Spouse name: Not on file  . Number of children: 0  . Years of education: 51  . Highest education level: Not on file  Occupational History  . Occupation: Retired  Tobacco Use  . Smoking status: Never Smoker  . Smokeless tobacco: Never Used  Substance and Sexual Activity  . Alcohol use:  No    Alcohol/week: 0.0 standard drinks  . Drug use: No  . Sexual activity: Not Currently  Other Topics Concern  . Not on file  Social History Narrative   Lives at home with husband.   Right-handed.   No caffeine use.   Social Determinants of Health   Financial Resource Strain:   . Difficulty of Paying Living Expenses: Not on file  Food Insecurity:   . Worried About Charity fundraiser in the Last Year: Not on file  . Ran Out of Food in the Last Year: Not on file  Transportation Needs:   . Lack of Transportation (Medical): Not on file  . Lack of Transportation (Non-Medical): Not on file  Physical Activity:   . Days of Exercise per Week: Not on file  . Minutes of Exercise per Session: Not on file  Stress:   . Feeling of Stress : Not on file   Social Connections:   . Frequency of Communication with Friends and Family: Not on file  . Frequency of Social Gatherings with Friends and Family: Not on file  . Attends Religious Services: Not on file  . Active Member of Clubs or Organizations: Not on file  . Attends Archivist Meetings: Not on file  . Marital Status: Not on file  Intimate Partner Violence:   . Fear of Current or Ex-Partner: Not on file  . Emotionally Abused: Not on file  . Physically Abused: Not on file  . Sexually Abused: Not on file    Outpatient Medications Prior to Visit  Medication Sig Dispense Refill  . acetaminophen (TYLENOL) 500 MG tablet Take 500 mg by mouth as needed for mild pain, moderate pain, fever or headache.    . ALPRAZolam (XANAX) 0.5 MG tablet Take 0.25 mg by mouth 3 (three) times daily as needed for anxiety. Order is 0.05 tid prn    . Cholecalciferol (VITAMIN D) 2000 UNITS tablet Take 2,000 Units by mouth daily.    . Cyanocobalamin (B-12) 1000 MCG TABS Take by mouth.    . docusate sodium (COLACE) 250 MG capsule Take 250 mg by mouth daily.    Marland Kitchen ezetimibe-simvastatin (VYTORIN) 10-20 MG tablet Take 1 tablet by mouth daily.    . famotidine (PEPCID) 20 MG tablet Take 1 tablet (20 mg total) by mouth at bedtime.    . metoprolol succinate (TOPROL-XL) 25 MG 24 hr tablet Take 0.5 tablets (12.5 mg total) by mouth daily. 90 tablet 3  . nystatin-triamcinolone (MYCOLOG II) cream Apply 1 application topically 2 (two) times daily. 30 g 0  . pantoprazole (PROTONIX) 40 MG tablet TAKE (1) TABLET BY MOUTH EACH MORNING. 90 tablet 2  . rivaroxaban (XARELTO) 20 MG TABS tablet Take 1 tablet (20 mg total) by mouth daily with supper. 90 tablet 3   No facility-administered medications prior to visit.    Allergies  Allergen Reactions  . Brimonidine Other (See Comments)    Crusting - difficulty opening eyes  . Codeine     Unspecified  . Morphine     Unspecified  . Sulfa Drugs Cross Reactors Other (See  Comments)    Unspecified    ROS Review of Systems  Skin:       Basal cell skin cancer  Neurological:       Gait imbalance      Objective:    Physical Exam  Constitutional: She is oriented to person, place, and time. She appears well-nourished.  Cardiovascular: Normal rate and regular rhythm.  Pulmonary/Chest: Effort normal and breath sounds normal.  Musculoskeletal:        General: Edema present.     Comments: Ankles bilat-swelling  Neurological: She is oriented to person, place, and time.    BP 140/83 (BP Location: Right Arm, Patient Position: Sitting, Cuff Size: Normal)   Pulse 78   Temp 97.8 F (36.6 C) (Temporal)   Ht 5' 7"  (1.702 m)   Wt 184 lb 3.2 oz (83.6 kg)   SpO2 97%   BMI 28.85 kg/m  Wt Readings from Last 3 Encounters:  02/21/20 184 lb 3.2 oz (83.6 kg)  11/10/19 183 lb (83 kg)  11/02/19 184 lb 9.6 oz (83.7 kg)     Health Maintenance Due  Topic Date Due  . PNA vac Low Risk Adult (2 of 2 - PPSV23) 10/17/2019    Lab Results  Component Value Date   TSH 4.95 03/20/2018   Lab Results  Component Value Date   WBC 5.6 07/23/2019   HGB 13.7 07/23/2019   HCT 43 07/23/2019   MCV 89 11/26/2016   PLT 215 12/12/2015   Lab Results  Component Value Date   NA 141 11/26/2016   K 4.4 11/26/2016   CHLORIDE 110 (H) 11/29/2015   CO2 25 11/26/2016   GLUCOSE 78 11/26/2016   BUN 21 11/26/2016   CREATININE 0.7 07/23/2019   BILITOT 0.43 11/29/2015   ALKPHOS 74 07/23/2019   AST 17 07/23/2019   ALT 16 07/23/2019   PROT 7.0 11/29/2015   ALBUMIN 4.1 07/23/2019   CALCIUM 10.1 07/23/2019   ANIONGAP 5 12/12/2015   EGFR 69 (L) 11/29/2015   Lab Results  Component Value Date   CHOL 137 07/23/2019   Lab Results  Component Value Date   HDL 45 07/23/2019   Lab Results  Component Value Date   LDLCALC 73 07/23/2019   Lab Results  Component Value Date   TRIG 97 07/23/2019   Lab Results  Component Value Date   CHOLHDL 2.9 11/12/2017     Assessment &  Plan:  1. Basal cell carcinoma (BCC) of cheek Ongoing follow up for basal cell on the right cheek - Ambulatory referral to Dermatology 2. Gait disturbance Pt with therapy in the past 5 years ago with improvement. Pt with neurology and MRI normal.  Pt uses a cane-consider a walker due to imbalance - Ambulatory referral to Physical Therapy  Follow-up:  PT  Juletta Berhe Hannah Beat, MD

## 2020-02-28 MED ORDER — METOPROLOL SUCCINATE ER 25 MG PO TB24: 25.0000 mg | ORAL_TABLET | Freq: Every day | ORAL | 3 refills | Status: DC

## 2020-02-28 NOTE — Telephone Encounter (Signed)
Pt made aware of the 340 B.

## 2020-02-28 NOTE — Telephone Encounter (Signed)
-----   Message from Jorge Ny, [place] sent at 02/24/2020 11:12 AM EST ----- Regarding: RE: 340 b Hey!  Unfortunately the only folks who can take advantage of the 340b program are self pay individuals- it is illegal for [facility] to sell to those with insurance at a discounted rate is what I have been told by our pharmacy.  Sorry there are not better options- the only things that can help with the Xarelto or Eliquis are their manufacturer programs or the Extra Help program  Have you already attempted to apply for [employer] and/or [employer] for their programs?  The Extra Help program is through [employer] and if the patient meets income requirements (makes less than about $1,700/month and has limited assets) she might qualify to get medicaid rates for her medications- she can call [employer] at [phone] and speak to a representative to help her apply or I can give her a call and assist with her applying over the phone if you think she might qualify ----- Message ----- From: Drema Dallas, [place]: 02/24/2020   9:05 AM EST To: Louann Liv, LCSW Subject: FW: 340 b                                       ----- Message ----- From: Rockne Menghini, RPH-CPP Sent: 02/24/2020   8:54 AM EST To: Drema Dallas, CMA Subject: RE: 7 b                                      Hailey Graham  It's no bother.  Unfortunately I don't know anything about the 340B programs.  I have never found a way for patients to get affordable Eliquis or Xarelto, but would love to know if there is something.  I would suggest reaching out to Raquel Sarna or Tammy Sours.  They are the [employer] workers and they probably understand these things better than me.  Erasmo Downer ----- Message ----- From: Drema Dallas, CMA Sent: 02/24/2020   8:02 AM EST To: Rockne Menghini, RPH-CPP Subject: 340 b                                          Good morning.  I hate to bother you with this but I had a patient ask me about  a 340 B program that [facility] has that is to help with prescriptions. I am not aware of anything that helps with pt's medications (xarelto) except the pt assistance, which this pt does not qualify for. She stated she was told about this by her insurance company. Any help with this would be appreciated.  ThANKS, [employer]

## 2020-03-20 DIAGNOSIS — H401411 Capsular glaucoma with pseudoexfoliation of lens, right eye, mild stage: Secondary | ICD-10-CM | POA: Diagnosis not present

## 2020-03-20 DIAGNOSIS — H401423 Capsular glaucoma with pseudoexfoliation of lens, left eye, severe stage: Secondary | ICD-10-CM | POA: Diagnosis not present

## 2020-03-29 ENCOUNTER — Other Ambulatory Visit (HOSPITAL_COMMUNITY): Payer: Self-pay | Admitting: Physician Assistant

## 2020-03-29 ENCOUNTER — Telehealth: Payer: Self-pay | Admitting: Family Medicine

## 2020-03-29 DIAGNOSIS — M79604 Pain in right leg: Secondary | ICD-10-CM

## 2020-03-29 NOTE — Telephone Encounter (Signed)
Patient called and said that we referred her to Dr. Tarri Glenn as her request but she states that it has to come from her insurance company. I am unsure about this? Does it need a prior authorization?

## 2020-03-30 ENCOUNTER — Ambulatory Visit (HOSPITAL_COMMUNITY)
Admission: RE | Admit: 2020-03-30 | Discharge: 2020-03-30 | Disposition: A | Discharge status: Home / Self Care | Payer: Medicare Other | Source: Ambulatory Visit | Attending: Physician Assistant | Admitting: Physician Assistant

## 2020-03-30 ENCOUNTER — Encounter (HOSPITAL_COMMUNITY): Payer: Self-pay

## 2020-03-30 ENCOUNTER — Other Ambulatory Visit: Payer: Self-pay

## 2020-03-30 DIAGNOSIS — M79604 Pain in right leg: Secondary | ICD-10-CM | POA: Insufficient documentation

## 2020-03-30 DIAGNOSIS — M79661 Pain in right lower leg: Secondary | ICD-10-CM | POA: Diagnosis not present

## 2020-03-30 NOTE — Telephone Encounter (Signed)
[employer] was called and waiting 24-72 hr for approval. Pending pre Auth number [id number]. ICD10 (given by Dermatology) code used was Z85.828 and C44.319 (from dx Basal cell carcinoma of right cheek. Dermatology was not in network is the reason for the prior auth CPT code used [id number]. Dermatology tax Id [id number]

## 2020-04-04 NOTE — Telephone Encounter (Signed)
Patient called back and is requesting Felicia to call her. She states that she spoke with her insurance company and she has been approved but it was only put in for 2 visits. She states they told her to let felicia know to contact the prior authorization center and get it changed to atleast 6 visits.

## 2020-04-27 ENCOUNTER — Ambulatory Visit: Payer: Medicare Other | Admitting: Family Medicine

## 2020-05-01 ENCOUNTER — Ambulatory Visit: Payer: Medicare Other | Admitting: Family Medicine

## 2020-05-01 DIAGNOSIS — H524 Presbyopia: Secondary | ICD-10-CM | POA: Diagnosis not present

## 2020-05-01 DIAGNOSIS — H52203 Unspecified astigmatism, bilateral: Secondary | ICD-10-CM | POA: Diagnosis not present

## 2020-05-01 DIAGNOSIS — H401411 Capsular glaucoma with pseudoexfoliation of lens, right eye, mild stage: Secondary | ICD-10-CM | POA: Diagnosis not present

## 2020-05-01 DIAGNOSIS — H401423 Capsular glaucoma with pseudoexfoliation of lens, left eye, severe stage: Secondary | ICD-10-CM | POA: Diagnosis not present

## 2020-05-10 DIAGNOSIS — L570 Actinic keratosis: Secondary | ICD-10-CM | POA: Diagnosis not present

## 2020-05-11 DIAGNOSIS — M170 Bilateral primary osteoarthritis of knee: Secondary | ICD-10-CM | POA: Diagnosis not present

## 2020-05-11 DIAGNOSIS — M1711 Unilateral primary osteoarthritis, right knee: Secondary | ICD-10-CM | POA: Diagnosis not present

## 2020-05-12 DIAGNOSIS — I10 Essential (primary) hypertension: Secondary | ICD-10-CM | POA: Diagnosis not present

## 2020-05-12 DIAGNOSIS — K219 Gastro-esophageal reflux disease without esophagitis: Secondary | ICD-10-CM | POA: Diagnosis not present

## 2020-05-12 DIAGNOSIS — E119 Type 2 diabetes mellitus without complications: Secondary | ICD-10-CM | POA: Diagnosis not present

## 2020-05-12 DIAGNOSIS — E785 Hyperlipidemia, unspecified: Secondary | ICD-10-CM | POA: Diagnosis not present

## 2020-05-12 DIAGNOSIS — Z79899 Other long term (current) drug therapy: Secondary | ICD-10-CM | POA: Diagnosis not present

## 2020-05-14 IMAGING — DX DG CHEST 2V
2 series · 2 of 2 positions shown · non-contrast
Comparison: CT chest 10/26/2009

CLINICAL DATA: Hemoptysis.

EXAM:
CHEST - 2 VIEW

[chest pa]
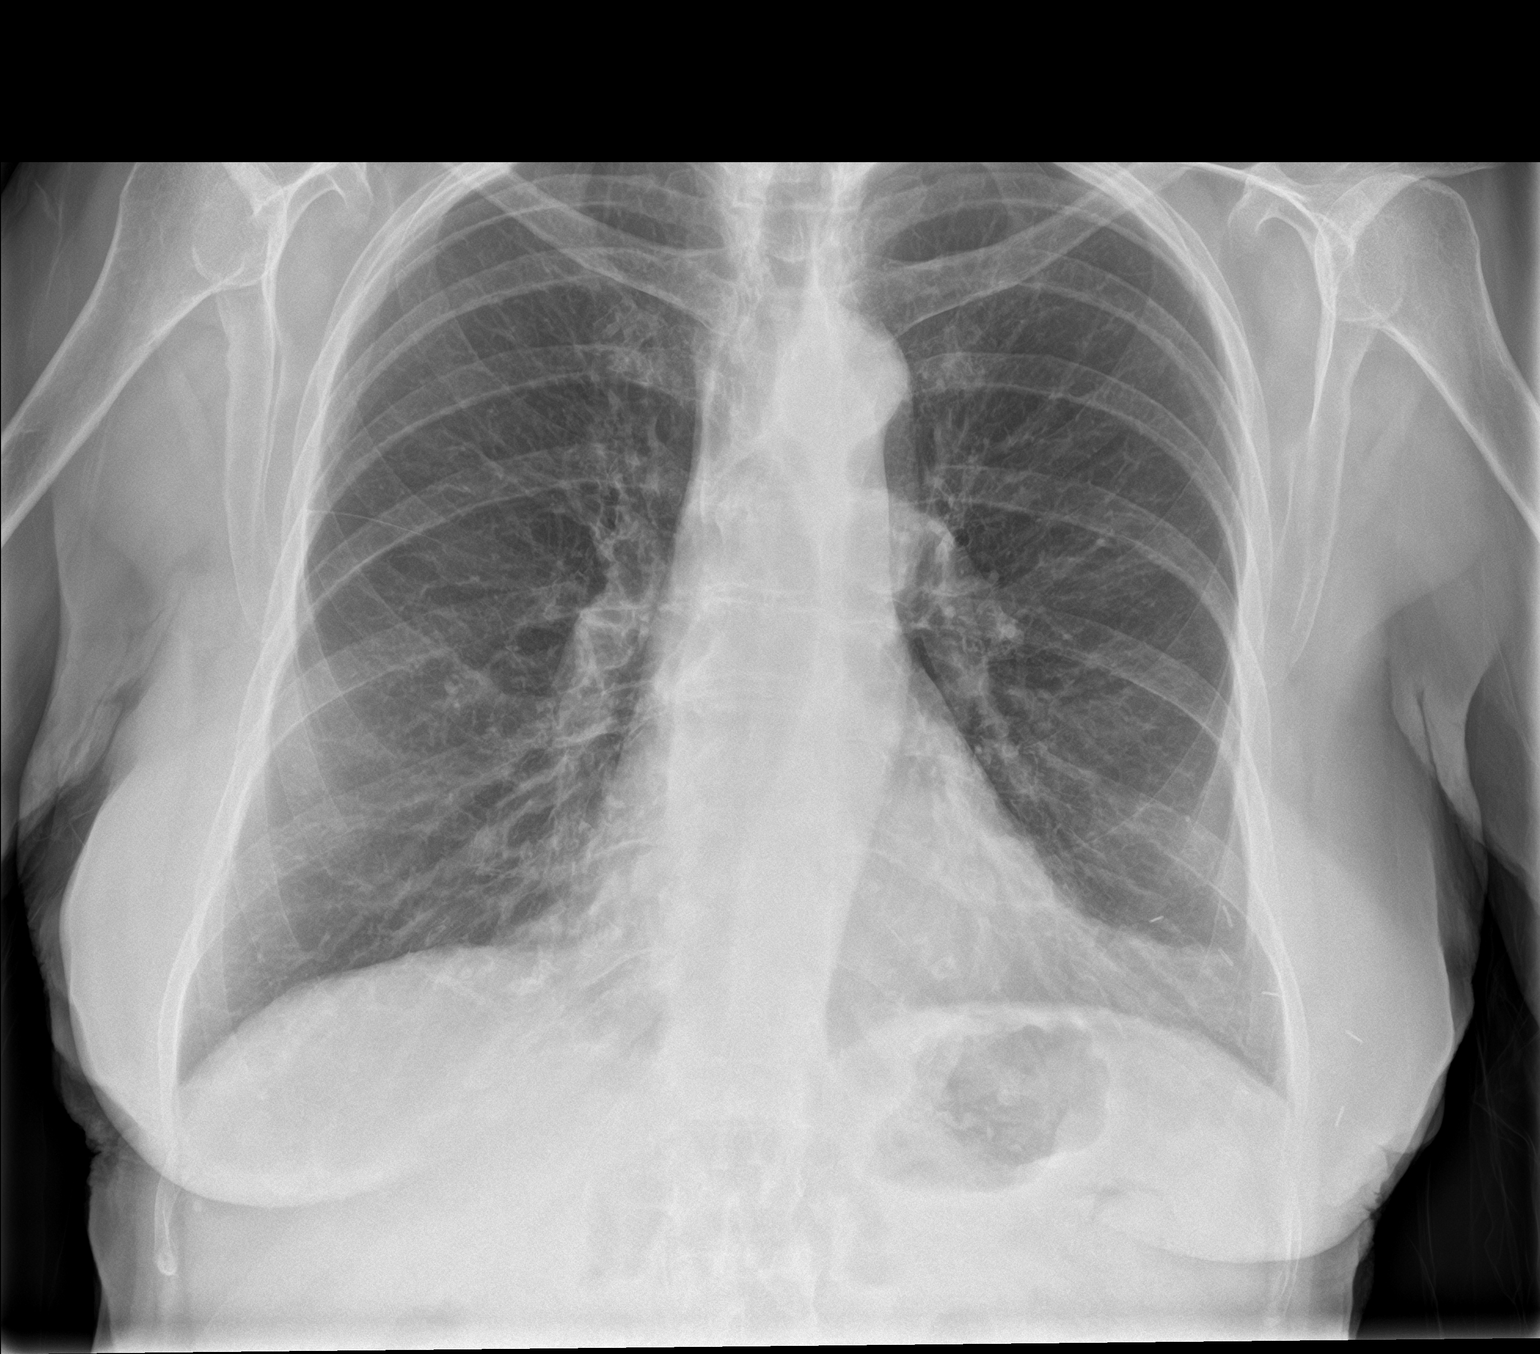

[chest lat]
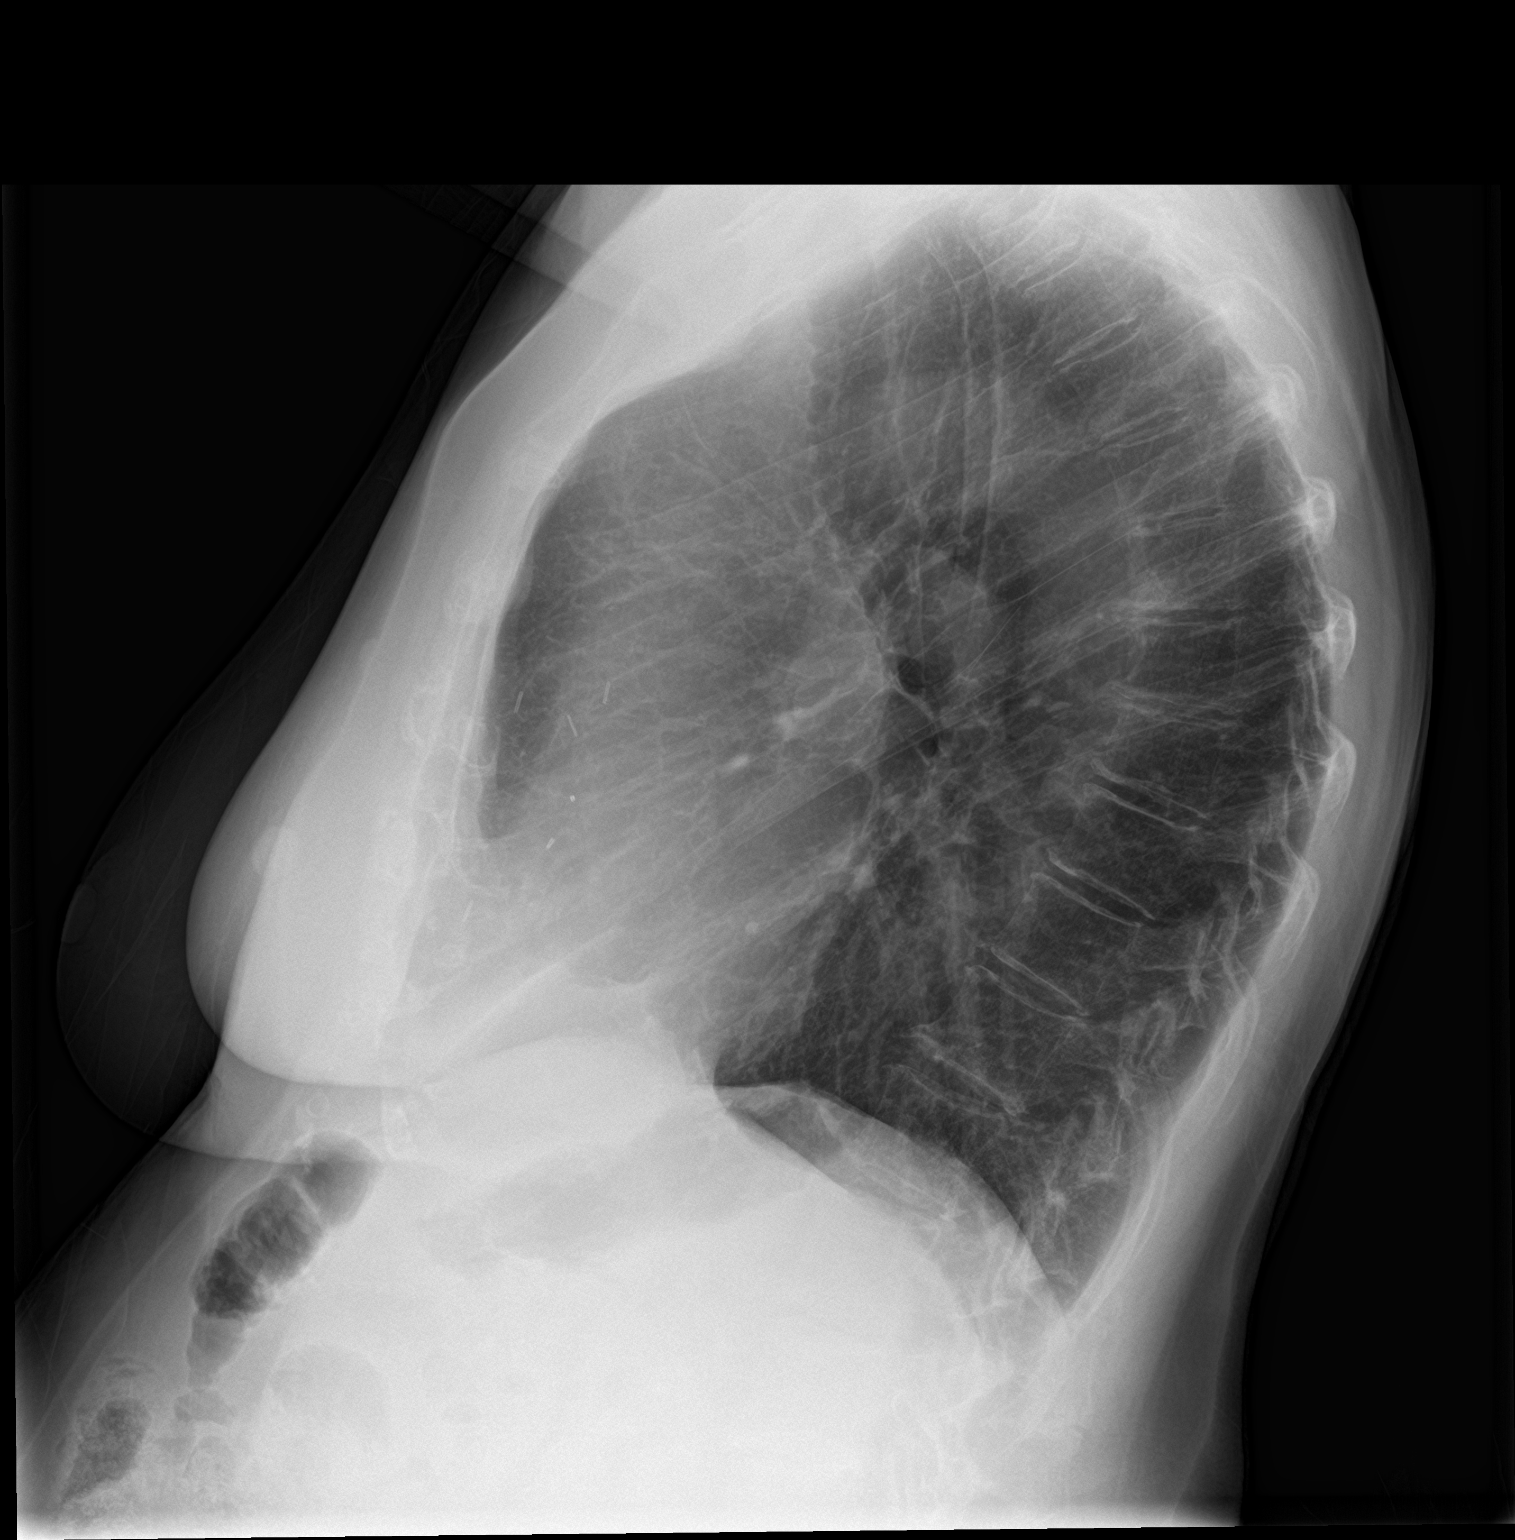

[2 of 2 positions shown; findings below may reference images not displayed]

FINDINGS: Heart size is normal. Lungs are clear. There is no edema or effusion
the lungs are moderately hyperinflated. Surgical clips are present
within the left breast
IMPRESSION: 1. No active cardiac pulmonary disease.
2. Stable changes of COPD.

## 2020-05-26 DIAGNOSIS — E785 Hyperlipidemia, unspecified: Secondary | ICD-10-CM | POA: Diagnosis not present

## 2020-05-26 DIAGNOSIS — I480 Paroxysmal atrial fibrillation: Secondary | ICD-10-CM | POA: Diagnosis not present

## 2020-05-26 DIAGNOSIS — Z0001 Encounter for general adult medical examination with abnormal findings: Secondary | ICD-10-CM | POA: Diagnosis not present

## 2020-05-26 DIAGNOSIS — I4510 Unspecified right bundle-branch block: Secondary | ICD-10-CM | POA: Diagnosis not present

## 2020-05-26 DIAGNOSIS — Z853 Personal history of malignant neoplasm of breast: Secondary | ICD-10-CM | POA: Diagnosis not present

## 2020-06-06 ENCOUNTER — Telehealth: Payer: Self-pay | Admitting: Student

## 2020-06-06 MED ORDER — EZETIMIBE-SIMVASTATIN 10-20 MG PO TABS: 1.0000 | ORAL_TABLET | Freq: Every day | ORAL | 6 refills | Status: DC

## 2020-06-06 NOTE — Telephone Encounter (Signed)
Please give pt a call concerning ezetimibe-simvastatin (VYTORIN) 10-20 MG tablet [[id number]]   [phone]

## 2020-06-06 NOTE — Telephone Encounter (Signed)
Pt called and stated that her insurance would no longer pay for name brand Vytorin. Pt request generic be call into pharmacy.

## 2020-06-09 ENCOUNTER — Telehealth: Payer: Self-pay | Admitting: Licensed Clinical Social Worker

## 2020-06-09 NOTE — Telephone Encounter (Signed)
CSW contacted patient to invite to the [employer] to be held at [employer] on 06-14-2020 at 4 pm. CSW left message for return call. Raquel Sarna, [place], [place]

## 2020-06-20 DIAGNOSIS — C50412 Malignant neoplasm of upper-outer quadrant of left female breast: Secondary | ICD-10-CM | POA: Diagnosis not present

## 2020-07-11 ENCOUNTER — Ambulatory Visit (INDEPENDENT_AMBULATORY_CARE_PROVIDER_SITE_OTHER): Payer: Medicare Other | Admitting: Internal Medicine

## 2020-07-17 DIAGNOSIS — N390 Urinary tract infection, site not specified: Secondary | ICD-10-CM | POA: Diagnosis not present

## 2020-07-18 DIAGNOSIS — M1711 Unilateral primary osteoarthritis, right knee: Secondary | ICD-10-CM | POA: Diagnosis not present

## 2020-07-18 DIAGNOSIS — M25561 Pain in right knee: Secondary | ICD-10-CM | POA: Diagnosis not present

## 2020-07-18 DIAGNOSIS — M170 Bilateral primary osteoarthritis of knee: Secondary | ICD-10-CM | POA: Diagnosis not present

## 2020-07-24 DIAGNOSIS — H401411 Capsular glaucoma with pseudoexfoliation of lens, right eye, mild stage: Secondary | ICD-10-CM | POA: Diagnosis not present

## 2020-07-24 DIAGNOSIS — H401423 Capsular glaucoma with pseudoexfoliation of lens, left eye, severe stage: Secondary | ICD-10-CM | POA: Diagnosis not present

## 2020-08-01 ENCOUNTER — Encounter (INDEPENDENT_AMBULATORY_CARE_PROVIDER_SITE_OTHER): Payer: Self-pay | Admitting: *Deleted

## 2020-08-01 ENCOUNTER — Ambulatory Visit (INDEPENDENT_AMBULATORY_CARE_PROVIDER_SITE_OTHER): Payer: Medicare Other | Admitting: Internal Medicine

## 2020-08-01 ENCOUNTER — Encounter (INDEPENDENT_AMBULATORY_CARE_PROVIDER_SITE_OTHER): Payer: Self-pay | Admitting: Internal Medicine

## 2020-08-01 ENCOUNTER — Other Ambulatory Visit: Payer: Self-pay

## 2020-08-01 VITALS — BP 155/94 | HR 78 | Temp 97.8°F | Ht 65.0 in | Wt 181.8 lb

## 2020-08-01 DIAGNOSIS — R1013 Epigastric pain: Secondary | ICD-10-CM

## 2020-08-01 DIAGNOSIS — K219 Gastro-esophageal reflux disease without esophagitis: Secondary | ICD-10-CM

## 2020-08-01 MED ORDER — PANTOPRAZOLE SODIUM 40 MG PO TBEC: DELAYED_RELEASE_TABLET | ORAL | 3 refills | Status: DC

## 2020-08-01 NOTE — Patient Instructions (Signed)
Take famotidine initially after evening meal instead of taking at bedtime. Notify if epigastric pain severity increases. Will request copy of recent blood work from Dr. Ria Comment office. Physician will call with results of abdominal ultrasound when completed.

## 2020-08-01 NOTE — Progress Notes (Addendum)
Presenting complaint;  Follow for chronic GERD. Patient complains of epigastric pain.  Database and subjective:  Patient is 86 year old Caucasian female who has chronic GERD who was last seen in the office in July 2020 was here for scheduled visit. She had EGD back in June 2020 revealing mild changes of reflux esophagitis to GE junction and she also has a small sliding hiatal hernia based on CT of 2015. She has required combination of PPI and H2B for symptom control.  Patient says she is doing well as stressed GERD symptoms are concerned.  She rarely has heartburn.  She also denies dysphagia.  She is not having any side effects with pantoprazole.  She needs a new prescription. Weight today is 1 of epigastric pain which usually occurs within 1 hour of eating a meal.  There is associated nausea.  It may last for several minutes.  She says pain is always mild.  Pain does not radiate into her back or other areas.  She has good appetite.  Her weight has been stable.  She denies melena or rectal bleeding.  Her bowels move daily or every other day.  She is on a stool softener.  She does not take aspirin or NSAIDs. She says she had blood work by Dr. Willey Blade possibly last month. She also complains of balance issues.  She does not feel lightheaded or vertigo.  She says she has seen neurologist in the past and all she had was examination.  She had SCD by Dr. Luan Pulling in December 2020 and no abnormalities were noted to account for her gait problems. Patient has never experienced a fall.  She is using a cane.  Patient is on anticoagulant for history of paroxysmal atrial fibrillation.   Current Medications: Outpatient Encounter Medications as of 08/01/2020  Medication Sig  . acetaminophen (TYLENOL) 500 MG tablet Take 500 mg by mouth as needed for mild pain, moderate pain, fever or headache.  . ALPRAZolam (XANAX) 0.5 MG tablet Take 0.25 mg by mouth 3 (three) times daily as needed for anxiety. Order is 0.05 tid  prn  . Cholecalciferol (VITAMIN D) 2000 UNITS tablet Take 2,000 Units by mouth daily.  . Cyanocobalamin (B-12) 1000 MCG TABS Take by mouth daily.   Marland Kitchen docusate sodium (COLACE) 250 MG capsule Take 250 mg by mouth daily.  . dorzolamide-timolol (COSOPT) 22.3-6.8 MG/ML ophthalmic solution 1 drop 2 (two) times daily.  Marland Kitchen ezetimibe-simvastatin (VYTORIN) 10-20 MG tablet Take 1 tablet by mouth daily.  . famotidine (PEPCID) 20 MG tablet Take 1 tablet (20 mg total) by mouth at bedtime.  Marland Kitchen latanoprost (XALATAN) 0.005 % ophthalmic solution Place 1 drop into both eyes.   . metoprolol succinate (TOPROL-XL) 25 MG 24 hr tablet Take 1 tablet (25 mg total) by mouth daily.  Marland Kitchen nystatin-triamcinolone (MYCOLOG II) cream Apply 1 application topically 2 (two) times daily.  . pantoprazole (PROTONIX) 40 MG tablet TAKE (1) TABLET BY MOUTH EACH MORNING.  . rivaroxaban (XARELTO) 20 MG TABS tablet Take 1 tablet (20 mg total) by mouth daily with supper.  . [DISCONTINUED] dorzolamide-timolol (COSOPT) 22.3-6.8 MG/ML ophthalmic solution Place 1 drop into both eyes 2 times daily.   No facility-administered encounter medications on file as of 08/01/2020.     Objective: Blood pressure (!) 155/94, pulse 78, temperature 97.8 F (36.6 C), temperature source Oral, height 5' 5"  (1.651 m), weight 181 lb 12.8 oz (82.5 kg). Patient is alert and in no acute distress. She appears younger than stated age. She is wearing a  mask. Conjunctiva is pink. Sclera is nonicteric Oropharyngeal mucosa is normal. No neck masses or thyromegaly noted. Cardiac exam with regular rhythm normal S1 and S2. No murmur or gallop noted. Lungs are clear to auscultation. Abdomen is full.  Bowel sounds are normal.  On palpation abdomen is soft and nontender with no organomegaly or masses. She has nonpitting pretibial edema involving both legs.  Labs/studies Results:   CBC Latest Ref Rng & Units 07/23/2019 11/26/2016 12/12/2015  WBC - 5.6 6.0 6.4  Hemoglobin  12.0 - 16.0 13.7 13.5 14.2  Hematocrit 36 - 46 43 40.4 44.1  Platelets 150 - 400 K/uL - - 215    CMP Latest Ref Rng & Units 07/23/2019 11/26/2016 12/12/2015  Glucose 65 - 99 mg/dL - 78 108(H)  BUN 8 - 27 mg/dL - 21 24(H)  Creatinine 0.5 - 1.1 0.7 0.73 0.81  Sodium 134 - 144 mmol/L - 141 142  Potassium 3.5 - 5.2 mmol/L - 4.4 4.3  Chloride 96 - 106 mmol/L - 103 109  CO2 18 - 29 mmol/L - 25 28  Calcium 8.7 - 10.7 10.1 9.7 9.9  Total Protein 6.4 - 8.3 g/dL - - -  Total Bilirubin 0.20 - 1.20 mg/dL - - -  Alkaline Phos 25 - 125 74 - -  AST 13 - 35 17 - -  ALT 7 - 35 16 - -    Hepatic Function Latest Ref Rng & Units 07/23/2019 11/29/2015 03/19/2013  Total Protein 6.4 - 8.3 g/dL - 7.0 6.6  Albumin 3.5 - 5.0 4.1 3.6 3.9  AST 13 - 35 17 15 17   ALT 7 - 35 16 12 20   Alk Phosphatase 25 - 125 74 79 70  Total Bilirubin 0.20 - 1.20 mg/dL - 0.43 0.3  Bilirubin, Direct 0.01 - 0.4 0.3 - -    Recent lab data requested from Dr. Ria Comment office.  Assessment:  #1.  Chronic GERD.  She is doing well with therapy.  Would consider dropping PPI dose once epigastric pain which is new is sorted out.  #2.  Epigastric pain.  Pain is postprandial.  Need to rule out gallbladder disease.  She does not take any NSAIDs.  Optic ulcer disease less likely.  Last EGD in June 2012 reveals small submucosal lesion in the fundus felt to be leiomyoma.  If ultrasound is negative would consider EGD.  #3.  Gait disorder.  Concerning for cerebellar disease.  Patient is anticoagulated and must take every precaution to prevent a fall.  She will follow-up with Dr. Willey Blade.  Plan:  Patient advised to take famotidine 20 mg immediately after evening meal rather than at bedtime. She will continue pantoprazole 40 mg by mouth 30 minutes before breakfast. Continue antireflux measures. Request copy of recent blood work from Dr. Ria Comment office. Schedule abdominal ultrasound. Office visit in 3 months regarding epigastric pain.

## 2020-08-07 ENCOUNTER — Other Ambulatory Visit: Payer: Self-pay

## 2020-08-07 ENCOUNTER — Ambulatory Visit (HOSPITAL_COMMUNITY)
Admission: RE | Admit: 2020-08-07 | Discharge: 2020-08-07 | Disposition: A | Discharge status: Home / Self Care | Payer: Medicare Other | Source: Ambulatory Visit | Attending: Internal Medicine | Admitting: Internal Medicine

## 2020-08-07 DIAGNOSIS — R1013 Epigastric pain: Secondary | ICD-10-CM | POA: Insufficient documentation

## 2020-08-07 DIAGNOSIS — N1330 Unspecified hydronephrosis: Secondary | ICD-10-CM | POA: Diagnosis not present

## 2020-08-14 ENCOUNTER — Telehealth: Payer: Self-pay | Admitting: Student

## 2020-08-14 MED ORDER — RIVAROXABAN 20 MG PO TABS: 20.0000 mg | ORAL_TABLET | Freq: Every day | ORAL | 11 refills | Status: DC

## 2020-08-14 NOTE — Telephone Encounter (Signed)
New message      *STAT* If patient is at the pharmacy, call can be transferred to refill team.   1. Which medications need to be refilled? (please list name of each medication and dose if known)  xarelto  2. Which pharmacy/location (including street and city if local pharmacy) is medication to be sent to? [facility]  3. Do they need a 30 day or 90 day supply?  [place]

## 2020-08-14 NOTE — Telephone Encounter (Signed)
Refill complete 

## 2020-08-21 DIAGNOSIS — S2242XA Multiple fractures of ribs, left side, initial encounter for closed fracture: Secondary | ICD-10-CM | POA: Diagnosis not present

## 2020-08-21 DIAGNOSIS — H401423 Capsular glaucoma with pseudoexfoliation of lens, left eye, severe stage: Secondary | ICD-10-CM | POA: Diagnosis not present

## 2020-08-21 DIAGNOSIS — H401411 Capsular glaucoma with pseudoexfoliation of lens, right eye, mild stage: Secondary | ICD-10-CM | POA: Diagnosis not present

## 2020-08-31 ENCOUNTER — Telehealth: Payer: Self-pay | Admitting: Student

## 2020-08-31 NOTE — Telephone Encounter (Signed)
Patient calling the office for samples of medication:   1.  What medication and dosage are you requesting samples for?  XARELTO 20 MG   2.  Are you currently out of this medication?     

## 2020-08-31 NOTE — Telephone Encounter (Signed)
Pt notified that we do not have Xarelto samples in office at this time in either [place] or [place] locations.

## 2020-09-11 ENCOUNTER — Other Ambulatory Visit: Payer: Self-pay | Admitting: Student

## 2020-09-11 NOTE — Telephone Encounter (Signed)
New message    DONT CALL IN UNTIL YOU SPEAK WITH PATIENT SHE IS GOING TO [facility] TO SEE IF THEY CAN CUT THE PILLS FOR HER OTHERWISE SHE WILL NEED THIS CALLED IN   *STAT* If patient is at the pharmacy, call can be transferred to refill team.   1. Which medications need to be refilled? (please list name of each medication and dose if known) metoprolol succinate (TOPROL-XL) 25 MG 24 hr tablet  2. Which pharmacy/location (including street and city if local pharmacy) is medication to be sent to?[facility]   3. Do they need a 30 day or 90 day supply?  [place]

## 2020-09-11 NOTE — Telephone Encounter (Signed)
Patient says she took her medication to the pharmacy and they broke her 25 mg toprol tablets in half and she no longer needed our help. Expressed thankfulness for calling her back.

## 2020-09-14 DIAGNOSIS — I480 Paroxysmal atrial fibrillation: Secondary | ICD-10-CM | POA: Diagnosis not present

## 2020-09-14 DIAGNOSIS — I10 Essential (primary) hypertension: Secondary | ICD-10-CM | POA: Diagnosis not present

## 2020-09-14 DIAGNOSIS — M1990 Unspecified osteoarthritis, unspecified site: Secondary | ICD-10-CM | POA: Diagnosis not present

## 2020-09-14 DIAGNOSIS — Z79899 Other long term (current) drug therapy: Secondary | ICD-10-CM | POA: Diagnosis not present

## 2020-09-25 DIAGNOSIS — R7309 Other abnormal glucose: Secondary | ICD-10-CM | POA: Diagnosis not present

## 2020-10-04 DIAGNOSIS — L570 Actinic keratosis: Secondary | ICD-10-CM | POA: Diagnosis not present

## 2020-10-09 ENCOUNTER — Ambulatory Visit: Payer: Medicare Other | Admitting: Hematology and Oncology

## 2020-10-10 ENCOUNTER — Ambulatory Visit (HOSPITAL_COMMUNITY): Payer: Medicare Other | Attending: Internal Medicine | Admitting: Physical Therapy

## 2020-10-10 ENCOUNTER — Encounter (HOSPITAL_COMMUNITY): Payer: Self-pay | Admitting: Physical Therapy

## 2020-10-10 ENCOUNTER — Other Ambulatory Visit: Payer: Self-pay

## 2020-10-10 DIAGNOSIS — M6281 Muscle weakness (generalized): Secondary | ICD-10-CM | POA: Diagnosis not present

## 2020-10-10 DIAGNOSIS — G8929 Other chronic pain: Secondary | ICD-10-CM

## 2020-10-10 DIAGNOSIS — M25561 Pain in right knee: Secondary | ICD-10-CM | POA: Insufficient documentation

## 2020-10-10 DIAGNOSIS — R2689 Other abnormalities of gait and mobility: Secondary | ICD-10-CM | POA: Insufficient documentation

## 2020-10-10 DIAGNOSIS — R29898 Other symptoms and signs involving the musculoskeletal system: Secondary | ICD-10-CM | POA: Diagnosis not present

## 2020-10-10 NOTE — Patient Instructions (Signed)
Access Code: [email or web address] URL: [email or web address] Date: 10/10/2020 Prepared by: [facility] Shaden Lacher  Exercises Seated Long Arc Quad - 2 x daily - 7 x weekly - 10 reps - 10 second hold

## 2020-10-10 NOTE — Therapy (Signed)
[facility] [address] [place], [place], [address] Phone: [phone]   Fax:  [phone]  Physical Therapy Evaluation  Patient Details  Name: Hailey Graham MRN: [id number] Date of Birth: 1928-06-04 Referring Provider (PT): Asencion Noble MD   Encounter Date: 10/10/2020   PT End of Session - 10/10/20 1400    Visit Number 1    Number of Visits 12    Date for PT Re-Evaluation 11/21/20    Authorization Type UHC medicare (no auth no visit limit)    Progress Note Due on Visit 10    PT Start Time 5366    PT Stop Time 1355    PT Time Calculation (min) 38 min    Equipment Utilized During Treatment Gait belt    Activity Tolerance Patient tolerated treatment well;Patient limited by pain    Behavior During Therapy [facility] for tasks assessed/performed           Past Medical History:  Diagnosis Date  . Allergy   . Anxiety   . Arthritis   . Breast cancer of upper-outer quadrant of left female breast ([place]) 11/24/2015  . Breast cancer of upper-outer quadrant of left female breast ([place]) 11/24/2015  . Cataract   . Complication of anesthesia   . Depression   . Diverticulosis   . Gait abnormality   . Gastroesophageal reflux disease   . Glaucoma   . Hyperlipidemia    Lipid profile in 05/2010:149, 86, 48, 84  . Lung nodule    Right lower lobe; stable in 2010  . Paroxysmal atrial fibrillation (HCC)    Rate related right bundle branch block; normal EF on echo in 2006; normal stress nuclear-2006; mild RV hypokinesis on MRI;  RA and RV enlargement on CT in 9/06; Anticoagulation->discontinued in 2009  . Personal history of kidney stones   . PONV (postoperative nausea and vomiting)    Nausea    Past Surgical History:  Procedure Laterality Date  . ABDOMINAL HYSTERECTOMY  2202  . ANKLE SURGERY Right 2003    Fracture- rod  . APPENDECTOMY     as a teenager  . BREAST LUMPECTOMY WITH RADIOACTIVE SEED LOCALIZATION Left 12/13/2015   Procedure: BREAST  LUMPECTOMY WITH RADIOACTIVE SEED LOCALIZATION;  Surgeon: Excell Seltzer, MD;  Location: [facility];  Service: General;  Laterality: Left;  . CATARACT EXTRACTION W/ INTRAOCULAR LENS IMPLANT Right 2014  . COLONOSCOPY W/ POLYPECTOMY    . ESOPHAGOGASTRODUODENOSCOPY    . GLAUCOMA SURGERY Right 2014  . HERNIA REPAIR Right    1990's  . ORIF ANKLE FRACTURE  2003   Right  . TONSILLECTOMY     age 77  . Varicose veins Right 2207    There were no vitals filed for this visit.    Subjective Assessment - 10/10/20 1322    Subjective Patient is a 86 y.o. female who presents to physical therapy with c/o balance deficits. Patient states when she stands for a short time her legs get weak. She has trouble going up and down steps and has to use a rail to hold to. She is not dizzy. She can pick things up and does not get dizzy. She has a torn ligament in her right knee in the back. They have been doing cortisone and gel injections which help sometimes. She has not had any falls. She brings her cane with her occasionally but does not typically need to use it at home. Her main goal is to be able to go up  and down steps without holding on. Patient states balance has been getting worse over the last 10 years.    Limitations House hold activities;Other (comment)   stairs   How long can you stand comfortably? 5 minutes    How long can you walk comfortably? 10 minutes    Patient Stated Goals go up and down steps without holding on    Currently in Pain? No/denies              [facility] PT Assessment - 10/10/20 0001      Assessment   Medical Diagnosis Balance Issues    Referring Provider (PT) Asencion Noble MD    Onset Date/Surgical Date 10/10/10    Next MD Visit none scheduled    Prior Therapy Yes      Precautions   Precautions Fall      Restrictions   Weight Bearing Restrictions No      Balance Screen   Has the patient fallen in the past 6 months No    Has the patient had a decrease in activity level because of  a fear of falling?  No    Is the patient reluctant to leave their home because of a fear of falling?  No      Home Environment   Living Environment Private residence    Living Arrangements Alone    Available Help at Discharge [place] Retired    Biomedical scientist Retired Mining engineer company      Cognition   Overall Cognitive Status Within Functional Limits for tasks assessed      Observation/Other Assessments   Observations Ambulates with [employer]    Focus on Therapeutic Outcomes (FOTO)  not completed      Sensation   Light Touch Appears Intact      ROM / Strength   AROM / PROM / Strength Strength      Strength   Strength Assessment Site Hip;Knee;Ankle    Right/Left Hip Right;Left    Right Hip Flexion 3+/5   lateral knee pain   Left Hip Flexion 4-/5    Right/Left Knee Right;Left    Right Knee Flexion 5/5    Right Knee Extension 5/5    Left Knee Flexion 5/5    Left Knee Extension 5/5    Right/Left Ankle Right;Left    Right Ankle Dorsiflexion 4+/5    Left Ankle Dorsiflexion 5/5      Palpation   Palpation comment TTP R LCL and fibular head and lateral joint line      Transfers   Five time sit to stand comments  30.76 seconds with use of hands    Comments feels unsteady upon standing      Ambulation/Gait   Ambulation/Gait Yes    Ambulation/Gait Assistance 6: Modified independent (Device/Increase time)    Ambulation Distance (Feet) 325 Feet    Assistive device None    Gait Pattern Wide base of support;Antalgic   LE externally rotated   Ambulation Surface Level;Indoor    Gait velocity decreased    Stairs Yes    Stairs Assistance 4: Min guard    Stair Management Technique Alternating pattern    Number of Stairs 4    Height of Stairs 7    Gait Comments 2MWT; stairs: unsteady, cueing for alternating pattern, bilateral use of hands on rails, limited on R LE by pain  Objective measurements completed on examination: See above findings.       [facility] Adult PT Treatment/Exercise - 10/10/20 0001      Exercises   Exercises Knee/Hip      Knee/Hip Exercises: Seated   Long Arc Quad Both;1 set;10 reps    Long Arc Quad Limitations 10 seconds                  PT Education - 10/10/20 1323    Education Details Patient eduated on exam findings, scope of PT, POC, initial HEP    Person(s) Educated Patient    Methods Explanation;Demonstration;Handout    Comprehension Verbalized understanding;Returned demonstration            PT Short Term Goals - 10/10/20 1426      PT SHORT TERM GOAL #1   Title Patient will be independent with HEP in order to improve functional outcomes.    Time 3    Period Weeks    Status New    Target Date 10/31/20      PT SHORT TERM GOAL #2   Title Patient will report at least 25% improvement in symptoms for improved quality of life.    Time 6    Period Weeks    Status New    Target Date 10/31/20             PT Long Term Goals - 10/10/20 1427      PT LONG TERM GOAL #1   Title Patient will report at least 75% improvement in symptoms for improved quality of life.    Time 6    Period Weeks    Status New    Target Date 11/21/20      PT LONG TERM GOAL #2   Title Patient will be able to complete 5x STS in under 20 seconds in order to reduce the risk of falls.    Time 6    Period Weeks    Status New    Target Date 11/21/20      PT LONG TERM GOAL #3   Title Patient will be able to navigate stairs with reciprocal pattern without compensation in order to demonstrate improved LE strength.    Time 6    Period Weeks    Status New    Target Date 11/21/20      PT LONG TERM GOAL #4   Title Patient will be able to ambulate at least 400 feet in 2MWT in order to demonstrate improved gait speed for community ambulation.    Time 6    Period Weeks    Status New    Target Date 11/21/20                   Plan - 10/10/20 1436    Clinical Impression Statement Patient is a 86 y.o. female who presents to physical therapy with c/o balance deficits and R knee pain. She presents with pain limited deficits in LE strength, endurance, gait, balance, stairs, transfers, and functional mobility with ADL. She is having to modify and restrict ADL as indicated subjective information and objective measures which is affecting overall participation.  Patient mainly limited by balance and L knee pain which limits functional mobility. Patient's main goal is to be able to improve balance with stair navigation. Patient will benefit from skilled physical therapy in order to improve function and reduce impairment.    Personal Factors and Comorbidities Age;Time since onset of injury/illness/exacerbation;Past/Current Experience;Fitness;Behavior Pattern    Examination-Activity  Limitations Locomotion Level;Transfers;Stand;Stairs;Squat;Bend;Lift    Examination-Participation Restrictions Church;Cleaning;Meal Prep;Shop;Volunteer;Yard Work;Community Activity    Stability/Clinical Decision Making Stable/Uncomplicated    Clinical Decision Making Low    Rehab Potential Fair    PT Frequency 2x / week    PT Duration 6 weeks    PT Treatment/Interventions ADLs/Self Care Home Management;Aquatic Therapy;Biofeedback;Cryotherapy;Electrical Stimulation;Iontophoresis 4mg /ml Dexamethasone;Moist Heat;Traction;Ultrasound;DME Instruction;Gait training;Stair training;Functional mobility training;Therapeutic activities;Therapeutic exercise;Balance training;Neuromuscular re-education;Patient/family education;Orthotic Fit/Training;Manual techniques;Compression bandaging;Scar mobilization;Passive range of motion;Dry needling;Energy conservation;Splinting;Taping;Vasopneumatic Device;Spinal Manipulations;Joint Manipulations    PT Next Visit Plan begin bilateral LE strengtheing with emphasis on R quads for R knee pain and hip strength and  progress to functional strengthening as able for stairs/sit to stands; initiate balance training as able; focus on functional strength as patient wants to be able to navigate stairs without UE use    PT Home Exercise Plan 10/10/20 LAQ    Consulted and Agree with Plan of Care Patient           Patient will benefit from skilled therapeutic intervention in order to improve the following deficits and impairments:  Abnormal gait, Difficulty walking, Decreased range of motion, Decreased endurance, Decreased activity tolerance, Pain, Decreased balance, Decreased mobility, Decreased strength, Improper body mechanics  Visit Diagnosis: Muscle weakness (generalized)  Other abnormalities of gait and mobility  Other symptoms and signs involving the musculoskeletal system  Chronic pain of right knee     Problem List Patient Active Problem List   Diagnosis Date Noted  . Abdominal pain, epigastric 08/01/2020  . Menopause 11/02/2019  . Situational anxiety 11/02/2019  . Breast cancer of upper-outer quadrant of left female breast ([facility]) 11/24/2015  . Hx of diagnostic tests 03/22/2013  . Chronic anticoagulation 02/19/2013  . Paroxysmal atrial fibrillation (HCC)   . Gastroesophageal reflux disease   . Hyperlipidemia   . Varicose veins of bilateral lower extremities with other complications 58/83/2549    3:20 PM, 10/10/20 Mearl Latin PT, DPT Physical Therapist at [place], [place], [address] Phone: [phone]   Fax:  [phone]  Name: Hailey Graham MRN: [id number] Date of Birth: 05/23/1928

## 2020-10-12 ENCOUNTER — Ambulatory Visit (HOSPITAL_COMMUNITY): Payer: Medicare Other

## 2020-10-12 ENCOUNTER — Encounter (HOSPITAL_COMMUNITY): Payer: Self-pay

## 2020-10-12 ENCOUNTER — Other Ambulatory Visit: Payer: Self-pay

## 2020-10-12 DIAGNOSIS — M6281 Muscle weakness (generalized): Secondary | ICD-10-CM | POA: Diagnosis not present

## 2020-10-12 DIAGNOSIS — R29898 Other symptoms and signs involving the musculoskeletal system: Secondary | ICD-10-CM | POA: Diagnosis not present

## 2020-10-12 DIAGNOSIS — R2689 Other abnormalities of gait and mobility: Secondary | ICD-10-CM

## 2020-10-12 DIAGNOSIS — G8929 Other chronic pain: Secondary | ICD-10-CM

## 2020-10-12 DIAGNOSIS — M25561 Pain in right knee: Secondary | ICD-10-CM | POA: Diagnosis not present

## 2020-10-12 NOTE — Patient Instructions (Signed)
Functional Quadriceps: Sit to Stand    Sit on edge of chair, feet flat on floor. Stand upright, extending knees fully. Repeat 5 times per set. Do 2 sets per day.  http://orth.exer.us/735   Copyright  VHI. All rights reserved.   Heel Raises    Stand with support. Tighten pelvic floor and hold. With knees straight, raise heels off ground.  Hold 5 seconds.  Repeat 10 times. Do 2 times a day.  Copyright  VHI. All rights reserved.

## 2020-10-12 NOTE — Therapy (Signed)
[facility] [address] [place], [place], [address] Phone: [phone]   Fax:  [phone]  Physical Therapy Treatment  Patient Details  Name: Hailey Graham MRN: [id number] Date of Birth: Jan 14, 1928 Referring Provider (PT): Asencion Noble MD   Encounter Date: 10/12/2020   PT End of Session - 10/12/20 1055    Visit Number 2    Number of Visits 12    Date for PT Re-Evaluation 11/21/20    Authorization Type UHC medicare (no auth no visit limit)    Progress Note Due on Visit 10    PT Start Time 1050    PT Stop Time 1128    PT Time Calculation (min) 38 min    Equipment Utilized During Treatment Gait belt    Activity Tolerance Patient tolerated treatment well;Patient limited by pain    Behavior During Therapy [facility] for tasks assessed/performed           Past Medical History:  Diagnosis Date  . Allergy   . Anxiety   . Arthritis   . Breast cancer of upper-outer quadrant of left female breast ([place]) 11/24/2015  . Breast cancer of upper-outer quadrant of left female breast ([place]) 11/24/2015  . Cataract   . Complication of anesthesia   . Depression   . Diverticulosis   . Gait abnormality   . Gastroesophageal reflux disease   . Glaucoma   . Hyperlipidemia    Lipid profile in 05/2010:149, 86, 48, 84  . Lung nodule    Right lower lobe; stable in 2010  . Paroxysmal atrial fibrillation (HCC)    Rate related right bundle branch block; normal EF on echo in 2006; normal stress nuclear-2006; mild RV hypokinesis on MRI;  RA and RV enlargement on CT in 9/06; Anticoagulation->discontinued in 2009  . Personal history of kidney stones   . PONV (postoperative nausea and vomiting)    Nausea    Past Surgical History:  Procedure Laterality Date  . ABDOMINAL HYSTERECTOMY  2202  . ANKLE SURGERY Right 2003    Fracture- rod  . APPENDECTOMY     as a teenager  . BREAST LUMPECTOMY WITH RADIOACTIVE SEED LOCALIZATION Left 12/13/2015   Procedure: BREAST  LUMPECTOMY WITH RADIOACTIVE SEED LOCALIZATION;  Surgeon: Excell Seltzer, MD;  Location: [facility];  Service: General;  Laterality: Left;  . CATARACT EXTRACTION W/ INTRAOCULAR LENS IMPLANT Right 2014  . COLONOSCOPY W/ POLYPECTOMY    . ESOPHAGOGASTRODUODENOSCOPY    . GLAUCOMA SURGERY Right 2014  . HERNIA REPAIR Right    1990's  . ORIF ANKLE FRACTURE  2003   Right  . TONSILLECTOMY     age 46  . Varicose veins Right 2207    There were no vitals filed for this visit.   Subjective Assessment - 10/12/20 1052    Subjective Pt reports increased pain distal  Rt knee going down leg on Tuesday, applied heat and tylonel for pain.  Current pain scale 6-7/10 constant sore achey pain.    Patient Stated Goals go up and down steps without holding on    Currently in Pain? Yes    Pain Score 7     Pain Location Knee    Pain Orientation Right    Pain Descriptors / Indicators Aching;Sore    Pain Type Chronic pain    Pain Onset More than a month ago    Pain Frequency Constant    Aggravating Factors  nothing    Pain Relieving Factors stay off of it  Effect of Pain on Daily Activities minimal, pushes through it.                             [facility] Adult PT Treatment/Exercise - 10/12/20 0001      Exercises   Exercises Knee/Hip      Knee/Hip Exercises: Standing   Heel Raises 10 reps    Hip Abduction Both;10 reps;Knee straight    Abduction Limitations cueing to reduce ER    Hip Extension Both;10 reps;Knee straight    Forward Step Up Both;10 reps;Hand Hold: 2;Step Height: 4"    Functional Squat 10 reps    Functional Squat Limitations hands on counter    Other Standing Knee Exercises NBOS 3x 30"      Knee/Hip Exercises: Seated   Long Arc Quad Both;1 set;10 reps    Long Arc Quad Limitations 10 seconds    Sit to [employer] 5 reps;without UE support   required elevated height, cueing to reduce knee valgus and a                 PT Education - 10/12/20 1058    Education Details  Reviewed goals, educated importance of HEP compliance for maximal benefits, pt able to recall and demonstrate LAQ with min cueing for eccentric control    Person(s) Educated Patient    Methods Explanation;Demonstration    Comprehension Verbalized understanding;Returned demonstration            PT Short Term Goals - 10/10/20 1426      PT SHORT TERM GOAL #1   Title Patient will be independent with HEP in order to improve functional outcomes.    Time 3    Period Weeks    Status New    Target Date 10/31/20      PT SHORT TERM GOAL #2   Title Patient will report at least 25% improvement in symptoms for improved quality of life.    Time 6    Period Weeks    Status New    Target Date 10/31/20             PT Long Term Goals - 10/10/20 1427      PT LONG TERM GOAL #1   Title Patient will report at least 75% improvement in symptoms for improved quality of life.    Time 6    Period Weeks    Status New    Target Date 11/21/20      PT LONG TERM GOAL #2   Title Patient will be able to complete 5x STS in under 20 seconds in order to reduce the risk of falls.    Time 6    Period Weeks    Status New    Target Date 11/21/20      PT LONG TERM GOAL #3   Title Patient will be able to navigate stairs with reciprocal pattern without compensation in order to demonstrate improved LE strength.    Time 6    Period Weeks    Status New    Target Date 11/21/20      PT LONG TERM GOAL #4   Title Patient will be able to ambulate at least 400 feet in 2MWT in order to demonstrate improved gait speed for community ambulation.    Time 6    Period Weeks    Status New    Target Date 11/21/20  Plan - 10/12/20 1308    Clinical Impression Statement Reviewed goals, educated importance of compliance with home exercise program, pt able to recall and demonstrate HEP.  Session focus on functional strengthening primarly quad and gluteal strengthening.  Pt required increased height  and cueing for gluteal activation with sit to stands as well as increased abduction with knees to reduce valgus.  Increased ease noted with increased reps.  Pt was limited by Rt knee pain through session though reports tolerance wiht all exercises and no increased pain at EOS.    Personal Factors and Comorbidities Age;Time since onset of injury/illness/exacerbation;Past/Current Experience;Fitness;Behavior Pattern    Examination-Activity Limitations Locomotion Level;Transfers;Stand;Stairs;Squat;Bend;Lift    Examination-Participation Restrictions Church;Cleaning;Meal Prep;Shop;Volunteer;Yard Work;Community Activity    Stability/Clinical Decision Making Stable/Uncomplicated    Clinical Decision Making Low    Rehab Potential Fair    PT Frequency 2x / week    PT Duration 6 weeks    PT Treatment/Interventions ADLs/Self Care Home Management;Aquatic Therapy;Biofeedback;Cryotherapy;Electrical Stimulation;Iontophoresis 4mg /ml Dexamethasone;Moist Heat;Traction;Ultrasound;DME Instruction;Gait training;Stair training;Functional mobility training;Therapeutic activities;Therapeutic exercise;Balance training;Neuromuscular re-education;Patient/family education;Orthotic Fit/Training;Manual techniques;Compression bandaging;Scar mobilization;Passive range of motion;Dry needling;Energy conservation;Splinting;Taping;Vasopneumatic Device;Spinal Manipulations;Joint Manipulations    PT Next Visit Plan Continue bilateral LE strengtheing with emphasis on R quads for R knee pain and hip strength and progress to functional strengthening as able for stairs/sit to stands; initiate balance training as able; focus on functional strength as patient wants to be able to navigate stairs without UE use    PT Home Exercise Plan 10/10/20 LAQ; 10/21: heel raises and STS           Patient will benefit from skilled therapeutic intervention in order to improve the following deficits and impairments:  Abnormal gait, Difficulty walking,  Decreased range of motion, Decreased endurance, Decreased activity tolerance, Pain, Decreased balance, Decreased mobility, Decreased strength, Improper body mechanics  Visit Diagnosis: Muscle weakness (generalized)  Other abnormalities of gait and mobility  Other symptoms and signs involving the musculoskeletal system  Chronic pain of right knee     Problem List Patient Active Problem List   Diagnosis Date Noted  . Abdominal pain, epigastric 08/01/2020  . Menopause 11/02/2019  . Situational anxiety 11/02/2019  . Breast cancer of upper-outer quadrant of left female breast ([facility]) 11/24/2015  . Hx of diagnostic tests 03/22/2013  . Chronic anticoagulation 02/19/2013  . Paroxysmal atrial fibrillation (HCC)   . Gastroesophageal reflux disease   . Hyperlipidemia   . Varicose veins of bilateral lower extremities with other complications 26/83/4196   Ihor Austin, LPTA/CLT; CBIS [phone]  Aldona Lento 10/12/2020, 6:28 PM  [facility] [place], [place], [address] Phone: [phone]   Fax:  [phone]  Name: Hailey Graham MRN: [id number] Date of Birth: November 10, 1928

## 2020-10-17 ENCOUNTER — Other Ambulatory Visit: Payer: Self-pay

## 2020-10-17 ENCOUNTER — Telehealth: Payer: Self-pay | Admitting: Student

## 2020-10-17 ENCOUNTER — Encounter (HOSPITAL_COMMUNITY): Payer: Self-pay | Admitting: Physical Therapy

## 2020-10-17 ENCOUNTER — Ambulatory Visit (HOSPITAL_COMMUNITY): Payer: Medicare Other | Admitting: Physical Therapy

## 2020-10-17 DIAGNOSIS — M6281 Muscle weakness (generalized): Secondary | ICD-10-CM

## 2020-10-17 DIAGNOSIS — G8929 Other chronic pain: Secondary | ICD-10-CM | POA: Diagnosis not present

## 2020-10-17 DIAGNOSIS — M25561 Pain in right knee: Secondary | ICD-10-CM | POA: Diagnosis not present

## 2020-10-17 DIAGNOSIS — R29898 Other symptoms and signs involving the musculoskeletal system: Secondary | ICD-10-CM | POA: Diagnosis not present

## 2020-10-17 DIAGNOSIS — R2689 Other abnormalities of gait and mobility: Secondary | ICD-10-CM

## 2020-10-17 NOTE — Telephone Encounter (Signed)
New message    Patient would like a call back from [facility] she is in the donut hole and needs her meds refilled

## 2020-10-17 NOTE — Patient Instructions (Signed)
Access Code: [email or web address] URL: [email or web address] Date: 10/17/2020 Prepared by: Margie Billet  Exercises Standing March with Counter Support - 1-2 x daily - 7 x weekly - 2 sets - 10 reps Standing Tandem Balance with Counter Support - 1 x daily - 7 x weekly - 2 reps - 30 second hold

## 2020-10-17 NOTE — Telephone Encounter (Signed)
Pt notified that samples of Xarelto placed at front desk. Pt asking to have Vytorin changed to a 30 day supply. Pharmacy notified

## 2020-10-17 NOTE — Therapy (Signed)
[facility] [address] [place], [place], [address] Phone: [phone]   Fax:  [phone]  Physical Therapy Treatment  Patient Details  Name: Hailey Graham MRN: [id number] Date of Birth: Oct 20, 1928 Referring Provider (PT): Asencion Noble MD   Encounter Date: 10/17/2020   PT End of Session - 10/17/20 1002    Visit Number 3    Number of Visits 12    Date for PT Re-Evaluation 11/21/20    Authorization Type UHC medicare (no auth no visit limit)    Progress Note Due on Visit 10    PT Start Time 1003    PT Stop Time 1043    PT Time Calculation (min) 40 min    Equipment Utilized During Treatment Gait belt    Activity Tolerance Patient tolerated treatment well;Patient limited by pain    Behavior During Therapy [facility] for tasks assessed/performed           Past Medical History:  Diagnosis Date   Allergy    Anxiety    Arthritis    Breast cancer of upper-outer quadrant of left female breast ([place]) 11/24/2015   Breast cancer of upper-outer quadrant of left female breast ([place]) 11/24/2015   Cataract    Complication of anesthesia    Depression    Diverticulosis    Gait abnormality    Gastroesophageal reflux disease    Glaucoma    Hyperlipidemia    Lipid profile in 05/2010:149, 86, 48, 84   Lung nodule    Right lower lobe; stable in 2010   Paroxysmal atrial fibrillation ([place])    Rate related right bundle branch block; normal EF on echo in 2006; normal stress nuclear-2006; mild RV hypokinesis on MRI;  RA and RV enlargement on CT in 9/06; Anticoagulation->discontinued in 2009   Personal history of kidney stones    PONV (postoperative nausea and vomiting)    Nausea    Past Surgical History:  Procedure Laterality Date   ABDOMINAL HYSTERECTOMY  2202   ANKLE SURGERY Right 2003    Fracture- rod   APPENDECTOMY     as a teenager   BREAST LUMPECTOMY WITH RADIOACTIVE SEED LOCALIZATION Left 12/13/2015   Procedure: BREAST  LUMPECTOMY WITH RADIOACTIVE SEED LOCALIZATION;  Surgeon: Excell Seltzer, MD;  Location: [facility];  Service: General;  Laterality: Left;   CATARACT EXTRACTION W/ INTRAOCULAR LENS IMPLANT Right 2014   COLONOSCOPY W/ POLYPECTOMY     ESOPHAGOGASTRODUODENOSCOPY     GLAUCOMA SURGERY Right 2014   HERNIA REPAIR Right    1990's   ORIF ANKLE FRACTURE  2003   Right   TONSILLECTOMY     age 86   Varicose veins Right 2207    There were no vitals filed for this visit.   Subjective Assessment - 10/17/20 1003    Subjective Patient is having a lot of knee pain. Patient felt alright after last session. She cant do much with her R leg with her home exercises.    Patient Stated Goals go up and down steps without holding on    Currently in Pain? Yes    Pain Score 9     Pain Location Knee    Pain Orientation Right    Pain Descriptors / Indicators Aching;Sore    Pain Type Chronic pain    Pain Onset More than a month ago  [facility] Adult PT Treatment/Exercise - 10/17/20 0001      Knee/Hip Exercises: Standing   Heel Raises 15 reps    Knee Flexion Both;2 sets;10 reps    Hip Flexion Both;2 sets;10 reps    Hip Abduction Both;1 set;15 reps    Hip Extension Both;1 set;15 reps    Forward Step Up Both;10 reps;Step Height: 4";Hand Hold: 1    Functional Squat 1 set;10 reps    Functional Squat Limitations with UE support    Other Standing Knee Exercises NBOS 1x 30", tandem stance 2x30 seconds bilateral      Knee/Hip Exercises: Seated   Long Arc Quad Both;1 set;10 reps    Long Arc Quad Limitations 10 seconds    Other Seated Knee/Hip Exercises toe raises 2x15    Sit to Sand 5 reps;without UE support;2 sets   from elevated surface, ball between knees                 PT Education - 10/17/20 1002    Education Details Patient educated on HEP, exercise mechanics    Person(s) Educated Patient    Methods Explanation;Demonstration    Comprehension  Verbalized understanding;Returned demonstration            PT Short Term Goals - 10/17/20 1007      PT SHORT TERM GOAL #1   Title Patient will be independent with HEP in order to improve functional outcomes.    Time 3    Period Weeks    Status On-going    Target Date 10/31/20      PT SHORT TERM GOAL #2   Title Patient will report at least 25% improvement in symptoms for improved quality of life.    Time 6    Period Weeks    Status On-going    Target Date 10/31/20             PT Long Term Goals - 10/17/20 1008      PT LONG TERM GOAL #1   Title Patient will report at least 75% improvement in symptoms for improved quality of life.    Time 6    Period Weeks    Status On-going      PT LONG TERM GOAL #2   Title Patient will be able to complete 5x STS in under 20 seconds in order to reduce the risk of falls.    Time 6    Period Weeks    Status On-going      PT LONG TERM GOAL #3   Title Patient will be able to navigate stairs with reciprocal pattern without compensation in order to demonstrate improved LE strength.    Time 6    Period Weeks    Status On-going      PT LONG TERM GOAL #4   Title Patient will be able to ambulate at least 400 feet in 2MWT in order to demonstrate improved gait speed for community ambulation.    Time 6    Period Weeks    Status On-going                 Plan - 10/17/20 1002    Clinical Impression Statement Patient initially requires UE support with marching but is able to complete without UE support but is unsteady and completes quickly due to impaired single leg balance. She requires cueing for slow, controlled standing hip exercises. Patient requires cueing for squatting mechanics with good carry over with UE support. She has c/o knee pain  with increasing flexion in squat. Patient with min/moderate sway with balance exercises today requiring intermittent UE support. Patient shows extreme valgus with sit to stand despite verbal cueing  to limit it. Placed ball between knees to improve mechanics with good carry over but patient has to continue to put hands on knees with seat elevated due to weakness. Patient will continue to benefit from skilled physical therapy in order to reduce impairment and improve function.    Personal Factors and Comorbidities Age;Time since onset of injury/illness/exacerbation;Past/Current Experience;Fitness;Behavior Pattern    Examination-Activity Limitations Locomotion Level;Transfers;Stand;Stairs;Squat;Bend;Lift    Examination-Participation Restrictions Church;Cleaning;Meal Prep;Shop;Volunteer;Yard Work;Community Activity    Stability/Clinical Decision Making Stable/Uncomplicated    Rehab Potential Fair    PT Frequency 2x / week    PT Duration 6 weeks    PT Treatment/Interventions ADLs/Self Care Home Management;Aquatic Therapy;Biofeedback;Cryotherapy;Electrical Stimulation;Iontophoresis 4mg /ml Dexamethasone;Moist Heat;Traction;Ultrasound;DME Instruction;Gait training;Stair training;Functional mobility training;Therapeutic activities;Therapeutic exercise;Balance training;Neuromuscular re-education;Patient/family education;Orthotic Fit/Training;Manual techniques;Compression bandaging;Scar mobilization;Passive range of motion;Dry needling;Energy conservation;Splinting;Taping;Vasopneumatic Device;Spinal Manipulations;Joint Manipulations    PT Next Visit Plan Continue bilateral LE strengtheing with emphasis on R quads for R knee pain and hip strength and progress to functional strengthening as able for stairs/sit to stands; initiate balance training as able; focus on functional strength as patient wants to be able to navigate stairs without UE use    PT Home Exercise Plan 10/10/20 LAQ; 10/21: heel raises and STS 10/26 tandem stance at counter, marching at counter           Patient will benefit from skilled therapeutic intervention in order to improve the following deficits and impairments:  Abnormal gait,  Difficulty walking, Decreased range of motion, Decreased endurance, Decreased activity tolerance, Pain, Decreased balance, Decreased mobility, Decreased strength, Improper body mechanics  Visit Diagnosis: Muscle weakness (generalized)  Other abnormalities of gait and mobility  Other symptoms and signs involving the musculoskeletal system  Chronic pain of right knee     Problem List Patient Active Problem List   Diagnosis Date Noted   Abdominal pain, epigastric 08/01/2020   Menopause 11/02/2019   Situational anxiety 11/02/2019   Breast cancer of upper-outer quadrant of left female breast ([place]) 11/24/2015   Hx of diagnostic tests 03/22/2013   Chronic anticoagulation 02/19/2013   Paroxysmal atrial fibrillation ([facility])    Gastroesophageal reflux disease    Hyperlipidemia    Varicose veins of bilateral lower extremities with other complications 72/90/2111    10:50 AM, 10/17/20 Mearl Latin PT, DPT Physical Therapist at [facility] [address] [place], [place], [address] Phone: [phone]   Fax:  [phone]  Name: Hailey Graham MRN: [id number] Date of Birth: 31-Jan-1928

## 2020-10-18 ENCOUNTER — Ambulatory Visit (HOSPITAL_COMMUNITY): Payer: Medicare Other | Admitting: Physical Therapy

## 2020-10-19 ENCOUNTER — Encounter (HOSPITAL_COMMUNITY): Payer: Medicare Other | Admitting: Physical Therapy

## 2020-10-19 DIAGNOSIS — N6321 Unspecified lump in the left breast, upper outer quadrant: Secondary | ICD-10-CM | POA: Diagnosis not present

## 2020-10-19 DIAGNOSIS — N6323 Unspecified lump in the left breast, lower outer quadrant: Secondary | ICD-10-CM | POA: Diagnosis not present

## 2020-10-19 DIAGNOSIS — R928 Other abnormal and inconclusive findings on diagnostic imaging of breast: Secondary | ICD-10-CM | POA: Diagnosis not present

## 2020-10-20 ENCOUNTER — Telehealth: Payer: Self-pay | Admitting: Hematology and Oncology

## 2020-10-20 NOTE — Telephone Encounter (Signed)
Called pt per 10/29 sch msg - no answer. Left message for patient to call back to reschedule appt.

## 2020-10-25 ENCOUNTER — Other Ambulatory Visit: Payer: Self-pay

## 2020-10-25 ENCOUNTER — Ambulatory Visit (HOSPITAL_COMMUNITY): Payer: Medicare Other | Attending: Internal Medicine | Admitting: Physical Therapy

## 2020-10-25 DIAGNOSIS — M25561 Pain in right knee: Secondary | ICD-10-CM | POA: Insufficient documentation

## 2020-10-25 DIAGNOSIS — R2689 Other abnormalities of gait and mobility: Secondary | ICD-10-CM | POA: Diagnosis not present

## 2020-10-25 DIAGNOSIS — M6281 Muscle weakness (generalized): Secondary | ICD-10-CM | POA: Diagnosis not present

## 2020-10-25 DIAGNOSIS — G8929 Other chronic pain: Secondary | ICD-10-CM | POA: Insufficient documentation

## 2020-10-25 DIAGNOSIS — R29898 Other symptoms and signs involving the musculoskeletal system: Secondary | ICD-10-CM | POA: Diagnosis not present

## 2020-10-25 NOTE — Therapy (Signed)
[facility] [place], [place], [address] Phone: [phone]   Fax:  [phone]  Physical Therapy Treatment  Patient Details  Name: Hailey Graham MRN: [id number] Date of Birth: 08-29-1928 Referring Provider (PT): Asencion Noble MD   Encounter Date: 10/25/2020   PT End of Session - 10/25/20 1339    Visit Number 4    Number of Visits 12    Date for PT Re-Evaluation 11/21/20    Authorization Type UHC medicare (no auth no visit limit)    Progress Note Due on Visit 10    PT Start Time 1055    PT Stop Time 1133    PT Time Calculation (min) 38 min    Equipment Utilized During Treatment Gait belt    Activity Tolerance Patient tolerated treatment well;Patient limited by pain    Behavior During Therapy [facility] for tasks assessed/performed           Past Medical History:  Diagnosis Date  . Allergy   . Anxiety   . Arthritis   . Breast cancer of upper-outer quadrant of left female breast ([place]) 11/24/2015  . Breast cancer of upper-outer quadrant of left female breast ([place] [place]) 11/24/2015  . Cataract   . Complication of anesthesia   . Depression   . Diverticulosis   . Gait abnormality   . Gastroesophageal reflux disease   . Glaucoma   . Hyperlipidemia    Lipid profile in 05/2010:149, 86, 48, 84  . Lung nodule    Right lower lobe; stable in 2010  . Paroxysmal atrial fibrillation ([facility])    Rate related right bundle branch block; normal EF on echo in 2006; normal stress nuclear-2006; mild RV hypokinesis on MRI;  RA and RV enlargement on CT in 9/06; Anticoagulation->discontinued in 2009  . Personal history of kidney stones   . PONV (postoperative nausea and vomiting)    Nausea    Past Surgical History:  Procedure Laterality Date  . ABDOMINAL HYSTERECTOMY  2202  . ANKLE SURGERY Right 2003    Fracture- rod  . APPENDECTOMY     as a teenager  . BREAST LUMPECTOMY WITH RADIOACTIVE SEED LOCALIZATION Left 12/13/2015   Procedure: BREAST  LUMPECTOMY WITH RADIOACTIVE SEED LOCALIZATION;  Surgeon: Excell Seltzer, MD;  Location: [facility];  Service: General;  Laterality: Left;  . CATARACT EXTRACTION W/ INTRAOCULAR LENS IMPLANT Right 2014  . COLONOSCOPY W/ POLYPECTOMY    . ESOPHAGOGASTRODUODENOSCOPY    . GLAUCOMA SURGERY Right 2014  . HERNIA REPAIR Right    1990's  . ORIF ANKLE FRACTURE  2003   Right  . TONSILLECTOMY     age 79  . Varicose veins Right 2207    There were no vitals filed for this visit.   Subjective Assessment - 10/25/20 1336    Subjective pt was 8 minutes late for appt today.  STates she has not done many of her exercises at home due to the pain being so bad behind her knee.    Currently in Pain? Yes    Pain Score 8     Pain Location Knee    Pain Orientation Right;Posterior    Pain Descriptors / Indicators Aching;Sore                             [facility] Adult PT Treatment/Exercise - 10/25/20 0001      Knee/Hip Exercises: Standing   Heel Raises 15 reps  Knee Flexion Both;2 sets;10 reps    Hip Flexion Both;2 sets;10 reps    Hip Abduction Both;1 set;15 reps    Hip Extension Both;1 set;15 reps    Forward Step Up Both;10 reps;Step Height: 4";Hand Hold: 1    Functional Squat 1 set;10 reps    Functional Squat Limitations with UE support    Other Standing Knee Exercises NBOS 1x 30", tandem stance 2x30 seconds bilateral      Knee/Hip Exercises: Seated   Long Arc Quad Both;1 set;10 reps    Long Arc Quad Limitations 10 seconds    Sit to [employer] 5 reps;without UE support;1 set                    PT Short Term Goals - 10/17/20 1007      PT SHORT TERM GOAL #1   Title Patient will be independent with HEP in order to improve functional outcomes.    Time 3    Period Weeks    Status On-going    Target Date 10/31/20      PT SHORT TERM GOAL #2   Title Patient will report at least 25% improvement in symptoms for improved quality of life.    Time 6    Period Weeks    Status  On-going    Target Date 10/31/20             PT Long Term Goals - 10/17/20 1008      PT LONG TERM GOAL #1   Title Patient will report at least 75% improvement in symptoms for improved quality of life.    Time 6    Period Weeks    Status On-going      PT LONG TERM GOAL #2   Title Patient will be able to complete 5x STS in under 20 seconds in order to reduce the risk of falls.    Time 6    Period Weeks    Status On-going      PT LONG TERM GOAL #3   Title Patient will be able to navigate stairs with reciprocal pattern without compensation in order to demonstrate improved LE strength.    Time 6    Period Weeks    Status On-going      PT LONG TERM GOAL #4   Title Patient will be able to ambulate at least 400 feet in 2MWT in order to demonstrate improved gait speed for community ambulation.    Time 6    Period Weeks    Status On-going                 Plan - 10/25/20 1339    Clinical Impression Statement Pt with continued complaints of posterior Rt knee pain.  Unable to complete all STS this session and need for intermittent rest breaks with standing exercises due to posterior knee discomfort.  Encouraged pt to complete her exercises more regularly and to not exceed certain number as feel she is doing more reps as she is not counting and making herself sore.  Pt required postural cues with standing hip abd/ext activity and cues to sit upright with seated exercises as tends to lean into extension.    Personal Factors and Comorbidities Age;Time since onset of injury/illness/exacerbation;Past/Current Experience;Fitness;Behavior Pattern    Examination-Activity Limitations Locomotion Level;Transfers;Stand;Stairs;Squat;Bend;Lift    Examination-Participation Restrictions Church;Cleaning;Meal Prep;Shop;Volunteer;Yard Work;Community Activity    Stability/Clinical Decision Making Stable/Uncomplicated    Rehab Potential Fair    PT Frequency 2x / week  PT Duration 6 weeks    PT  Treatment/Interventions ADLs/Self Care Home Management;Aquatic Therapy;Biofeedback;Cryotherapy;Electrical Stimulation;Iontophoresis 4mg /ml Dexamethasone;Moist Heat;Traction;Ultrasound;DME Instruction;Gait training;Stair training;Functional mobility training;Therapeutic activities;Therapeutic exercise;Balance training;Neuromuscular re-education;Patient/family education;Orthotic Fit/Training;Manual techniques;Compression bandaging;Scar mobilization;Passive range of motion;Dry needling;Energy conservation;Splinting;Taping;Vasopneumatic Device;Spinal Manipulations;Joint Manipulations    PT Next Visit Plan Continue bilateral LE strengtheing with emphasis on R quads for R knee pain and hip strength and progress to functional strengthening as able for stairs/sit to stands; initiate balance training as able; focus on functional strength as patient wants to be able to navigate stairs without UE use    PT Home Exercise Plan 10/10/20 LAQ; 10/21: heel raises and STS 10/26 tandem stance at counter, marching at counter           Patient will benefit from skilled therapeutic intervention in order to improve the following deficits and impairments:  Abnormal gait, Difficulty walking, Decreased range of motion, Decreased endurance, Decreased activity tolerance, Pain, Decreased balance, Decreased mobility, Decreased strength, Improper body mechanics  Visit Diagnosis: Muscle weakness (generalized)  Other abnormalities of gait and mobility  Other symptoms and signs involving the musculoskeletal system  Chronic pain of right knee     Problem List Patient Active Problem List   Diagnosis Date Noted  . Abdominal pain, epigastric 08/01/2020  . Menopause 11/02/2019  . Situational anxiety 11/02/2019  . Breast cancer of upper-outer quadrant of left female breast ([facility]) 11/24/2015  . Hx of diagnostic tests 03/22/2013  . Chronic anticoagulation 02/19/2013  . Paroxysmal atrial fibrillation ([facility])   . Gastroesophageal  reflux disease   . Hyperlipidemia   . Varicose veins of bilateral lower extremities with other complications 76/19/5093   Teena Irani, PTA/CLT [id number]  Teena Irani 10/25/2020, 1:41 PM  [facility] [place], [place], [address] Phone: [phone]   Fax:  [phone]  Name: Hailey Graham MRN: [id number] Date of Birth: 04-17-1928

## 2020-10-26 ENCOUNTER — Telehealth (HOSPITAL_COMMUNITY): Payer: Self-pay | Admitting: Physical Therapy

## 2020-10-26 ENCOUNTER — Encounter (HOSPITAL_COMMUNITY): Payer: Medicare Other | Admitting: Physical Therapy

## 2020-10-26 NOTE — Telephone Encounter (Signed)
pt called to cx this appt.

## 2020-10-30 ENCOUNTER — Ambulatory Visit: Payer: Medicare Other | Admitting: Hematology and Oncology

## 2020-10-31 ENCOUNTER — Other Ambulatory Visit: Payer: Self-pay | Admitting: Radiology

## 2020-10-31 ENCOUNTER — Ambulatory Visit (HOSPITAL_COMMUNITY): Payer: Medicare Other | Admitting: Physical Therapy

## 2020-10-31 DIAGNOSIS — R590 Localized enlarged lymph nodes: Secondary | ICD-10-CM | POA: Diagnosis not present

## 2020-10-31 DIAGNOSIS — Z170 Estrogen receptor positive status [ER+]: Secondary | ICD-10-CM | POA: Diagnosis not present

## 2020-10-31 DIAGNOSIS — N6325 Unspecified lump in the left breast, overlapping quadrants: Secondary | ICD-10-CM | POA: Diagnosis not present

## 2020-10-31 DIAGNOSIS — C50412 Malignant neoplasm of upper-outer quadrant of left female breast: Secondary | ICD-10-CM | POA: Diagnosis not present

## 2020-10-31 DIAGNOSIS — N6321 Unspecified lump in the left breast, upper outer quadrant: Secondary | ICD-10-CM | POA: Diagnosis not present

## 2020-10-31 DIAGNOSIS — C779 Secondary and unspecified malignant neoplasm of lymph node, unspecified: Secondary | ICD-10-CM | POA: Diagnosis not present

## 2020-10-31 DIAGNOSIS — C50812 Malignant neoplasm of overlapping sites of left female breast: Secondary | ICD-10-CM | POA: Diagnosis not present

## 2020-11-02 ENCOUNTER — Ambulatory Visit (HOSPITAL_COMMUNITY): Payer: Medicare Other | Admitting: Physical Therapy

## 2020-11-02 ENCOUNTER — Telehealth (HOSPITAL_COMMUNITY): Payer: Self-pay | Admitting: Physical Therapy

## 2020-11-02 NOTE — Telephone Encounter (Signed)
pt called to cx these appts due to she has another md appt and wont know anything until after tuesday.

## 2020-11-06 NOTE — Progress Notes (Signed)
Patient Care Team: Asencion Noble, MD as PCP - General (Internal Medicine) Herminio Commons, MD (Inactive) as PCP - Cardiology (Cardiology) Rogene Houston, MD (Gastroenterology) Excell Seltzer, MD (Inactive) as Consulting Physician (General Surgery) Nicholas Lose, MD as Consulting Physician (Hematology and Oncology) Arloa Koh, MD (Inactive) as Consulting Physician (Radiation Oncology) Sylvan Cheese, NP as Nurse Practitioner (Hematology and Oncology)  DIAGNOSIS:    ICD-10-CM   1. Malignant neoplasm of upper-outer quadrant of left breast in female, estrogen receptor positive ([place])  C50.412    Z17.0     SUMMARY OF ONCOLOGIC HISTORY: Oncology History  Breast cancer of upper-outer quadrant of left female breast ([place])  11/02/2015 Mammogram   screening mammogram category B breast density, asymmetry with indistinct margin in the left breast upper outer quadrant posterior depth, 2 internal mammary lymph nodes, 6 mm at 2:00 position axillary negative   11/21/2015 Initial Diagnosis   left breast biopsy: Invasive ductal carcinoma with associated low-grade DCIS with microcalcifications focally involving a papilloma, ER/PR 100%, HER-2 negative ratio 1.22   12/13/2015 Surgery   Left lumpectomy: Invasive ductal carcinoma 0.4 cm, DCIS 1.6 cm, clear margins, ER 100%, PR 100%, HER-2 negative ratio 1.22, Ki-67 5% T1 N0 stage IA   12/21/2015 - 03/20/2016 Anti-estrogen oral therapy   Tamoxifen 20 mg daily decreased to 10 mg daily, unable to tolerate it.   10/31/2020 Relapse/Recurrence   Recurrent disease: Multifocal  0.9 cm, 0.3 cm and an intramammary lymph node all 3 biopsy-proven grade 1 IDC ER/PR positive HER-2 equivocal FISH pending, Ki-67 2%     CHIEF COMPLIANT: Surveillance of breast cancer  INTERVAL HISTORY: Hailey Graham is a 86 y.o. with above-mentioned history of left breast cancertreated withlumpectomywho could not toleratetamoxifen, and is currently on  surveillance. She presents to the clinic today for annual follow-up.    ALLERGIES:  is allergic to brimonidine, codeine, morphine, and sulfa drugs cross reactors.  MEDICATIONS:  Current Outpatient Medications  Medication Sig Dispense Refill  . acetaminophen (TYLENOL) 500 MG tablet Take 500 mg by mouth as needed for mild pain, moderate pain, fever or headache.    . ALPRAZolam (XANAX) 0.5 MG tablet Take 0.25 mg by mouth 3 (three) times daily as needed for anxiety. Order is 0.05 tid prn    . anastrozole (ARIMIDEX) 1 MG tablet Take 1 tablet (1 mg total) by mouth daily. 90 tablet 3  . Cholecalciferol (VITAMIN D) 2000 UNITS tablet Take 2,000 Units by mouth daily.    . Cyanocobalamin (B-12) 1000 MCG TABS Take by mouth daily.     Marland Kitchen docusate sodium (COLACE) 250 MG capsule Take 250 mg by mouth daily.    . dorzolamide-timolol (COSOPT) 22.3-6.8 MG/ML ophthalmic solution 1 drop 2 (two) times daily.    Marland Kitchen ezetimibe-simvastatin (VYTORIN) 10-20 MG tablet Take 1 tablet by mouth daily. 30 tablet 6  . famotidine (PEPCID) 20 MG tablet Take 1 tablet (20 mg total) by mouth at bedtime.    Marland Kitchen latanoprost (XALATAN) 0.005 % ophthalmic solution Place 1 drop into both eyes.     . metoprolol succinate (TOPROL-XL) 25 MG 24 hr tablet Take 1 tablet (25 mg total) by mouth daily. (Patient taking differently: Take 12.5 mg by mouth daily. ) 90 tablet 3  . nystatin-triamcinolone (MYCOLOG II) cream Apply 1 application topically 2 (two) times daily. 30 g 0  . pantoprazole (PROTONIX) 40 MG tablet TAKE (1) TABLET BY MOUTH EACH MORNING. 90 tablet 3  . rivaroxaban (XARELTO) 20 MG TABS tablet Take  1 tablet (20 mg total) by mouth daily with supper. 30 tablet 11   No current facility-administered medications for this visit.    PHYSICAL EXAMINATION: ECOG PERFORMANCE STATUS: 1 - Symptomatic but completely ambulatory  Vitals:   11/07/20 1358  BP: (!) 149/87  Pulse: 64  Resp: 18  Temp: 97.7 F (36.5 C)  SpO2: 98%   Filed Weights     11/07/20 1358  Weight: 182 lb 6.4 oz (82.7 kg)    BREAST: No palpable masses or nodules in either right or left breasts. No palpable axillary supraclavicular or infraclavicular adenopathy no breast tenderness or nipple discharge. (exam performed in the presence of a chaperone)  LABORATORY DATA:  I have reviewed the data as listed CMP Latest Ref Rng & Units 07/23/2019 11/26/2016 12/12/2015  Glucose 65 - 99 mg/dL - 78 108(H)  BUN 8 - 27 mg/dL - 21 24(H)  Creatinine 0.5 - 1.1 0.7 0.73 0.81  Sodium 134 - 144 mmol/L - 141 142  Potassium 3.5 - 5.2 mmol/L - 4.4 4.3  Chloride 96 - 106 mmol/L - 103 109  CO2 18 - 29 mmol/L - 25 28  Calcium 8.7 - 10.7 10.1 9.7 9.9  Total Protein 6.4 - 8.3 g/dL - - -  Total Bilirubin 0.20 - 1.20 mg/dL - - -  Alkaline Phos 25 - 125 74 - -  AST 13 - 35 17 - -  ALT 7 - 35 16 - -    Lab Results  Component Value Date   WBC 5.6 07/23/2019   HGB 13.7 07/23/2019   HCT 43 07/23/2019   MCV 89 11/26/2016   PLT 215 12/12/2015   NEUTROABS 3 07/23/2019    ASSESSMENT & PLAN:  Breast cancer of upper-outer quadrant of left female breast ([place]) Left lumpectomy 12/13/2015: Invasive ductal carcinoma 0.4 cm, DCIS 1.6 cm, clear margins, ER 100%, PR 100%, HER-2 negative ratio 1.22, Ki-67 5% T1 N0 stage IA  Tamoxifen started 12/21/2015 discontinued 03/20/2016 (for severe hot flashes, night sweats)  Breast Cancer Surveillance: 1. Breast exam 10/07/2019: Small palpable skin nodule the surgical scar in the left breast: Biopsy scar tissue 2. Mammogram: 2 new suspicious masses and 1 abnormal intramammary lymph node  10/31/2020: Left breast biopsy for 0.9 cm mass at 2 o'clock position: Grade 1 IDC ER greater than 95%, PR greater than 95%, HER-2 2+ by IHC FISH pending, intramammary lymph node biopsy: Positive for cancer, left breast biopsy 3 o'clock position: Grade 1 IDC with DCIS  Plan: Patient does not want to undergo mastectomy.  She has an appointment to see Dr. Donne Hazel  November 30, 2020.  She wants to discuss surgical options with him.  If she is not a candidate for surgery then she could be treated with palliative intent antiestrogen therapy. In fact started her on anastrozole today.  I discussed risks and benefits and she is willing to proceed. She would prefer to undergo lumpectomy and take antiestrogen therapy subsequently.  Return to clinic after surgery to discuss the pathology report if she does undergo surgery.  If she does not undergo surgery then I will see her back in 3 months to assess tolerability.  And plan to perform mammograms in 6 months.  No orders of the defined types were placed in this encounter.  The patient has a good understanding of the overall plan. she agrees with it. she will call with any problems that may develop before the next visit here.  Total time spent: 20 mins including face  to face time and time spent for planning, charting and coordination of care  Nicholas Lose, MD 11/07/2020  I, Cloyde Reams Dorshimer, am acting as scribe for Dr. Nicholas Lose.  I have reviewed the above documentation for accuracy and completeness, and I agree with the above.

## 2020-11-07 ENCOUNTER — Inpatient Hospital Stay: Payer: Medicare Other | Attending: Hematology and Oncology | Admitting: Hematology and Oncology

## 2020-11-07 ENCOUNTER — Other Ambulatory Visit: Payer: Self-pay

## 2020-11-07 ENCOUNTER — Encounter (HOSPITAL_COMMUNITY): Payer: Medicare Other | Admitting: Physical Therapy

## 2020-11-07 DIAGNOSIS — Z79899 Other long term (current) drug therapy: Secondary | ICD-10-CM | POA: Insufficient documentation

## 2020-11-07 DIAGNOSIS — Z170 Estrogen receptor positive status [ER+]: Secondary | ICD-10-CM | POA: Diagnosis not present

## 2020-11-07 DIAGNOSIS — Z79811 Long term (current) use of aromatase inhibitors: Secondary | ICD-10-CM | POA: Diagnosis not present

## 2020-11-07 DIAGNOSIS — C50412 Malignant neoplasm of upper-outer quadrant of left female breast: Secondary | ICD-10-CM | POA: Diagnosis not present

## 2020-11-07 DIAGNOSIS — Z7901 Long term (current) use of anticoagulants: Secondary | ICD-10-CM | POA: Insufficient documentation

## 2020-11-07 MED ORDER — ANASTROZOLE 1 MG PO TABS: 1.0000 mg | ORAL_TABLET | Freq: Every day | ORAL | 3 refills | Status: DC

## 2020-11-07 NOTE — Assessment & Plan Note (Signed)
Left lumpectomy 12/13/2015: Invasive ductal carcinoma 0.4 cm, DCIS 1.6 cm, clear margins, ER 100%, PR 100%, HER-2 negative ratio 1.22, Ki-67 5% T1 N0 stage IA  Tamoxifen started 12/21/2015 discontinued 03/20/2016 (for severe hot flashes, night sweats)  Breast Cancer Surveillance: 1. Breast exam 10/07/2019: Small palpable skin nodule the surgical scar in the left breast: Biopsy scar tissue 2. Mammogram: 2 new suspicious masses and 1 abnormal intramammary lymph node  10/31/2020: Left breast biopsy for 0.9 cm mass at 2 o'clock position: Grade 1 IDC ER greater than 95%, PR greater than 95%, HER-2 2+ by IHC FISH pending, intramammary lymph node biopsy: Positive for cancer, left breast biopsy 3 o'clock position: Grade 1 IDC with DCIS  Plan: Mastectomy followed by antiestrogen therapy 

## 2020-11-09 ENCOUNTER — Ambulatory Visit (HOSPITAL_COMMUNITY): Payer: Medicare Other | Admitting: Physical Therapy

## 2020-11-10 ENCOUNTER — Telehealth: Payer: Self-pay | Admitting: Hematology and Oncology

## 2020-11-10 NOTE — Telephone Encounter (Signed)
No 11/16 los, no changes made to pt schedule

## 2020-11-13 ENCOUNTER — Telehealth (HOSPITAL_COMMUNITY): Payer: Self-pay | Admitting: Physical Therapy

## 2020-11-13 ENCOUNTER — Ambulatory Visit (HOSPITAL_COMMUNITY): Payer: Medicare Other | Admitting: Physical Therapy

## 2020-11-13 NOTE — Telephone Encounter (Signed)
Patient no show, left message for patient making her aware of missed appointment and reminded her of next appointment.   11:06 AM, 11/13/20 Mearl Latin PT, DPT Physical Therapist at [facility]

## 2020-11-14 ENCOUNTER — Telehealth: Payer: Self-pay | Admitting: Student

## 2020-11-14 NOTE — Telephone Encounter (Signed)
3 Sample bottles of Xarelto 20 mg

## 2020-11-14 NOTE — Telephone Encounter (Signed)
NEW MESSAGE    Patient calling the office for samples of medication:   1.  What medication and dosage are you requesting samples for?  XARELTO   2.  Are you currently out of this medication?  ALMOST    ALSO WANTS KISHA TO CALL HER , SHE NEEDS TO SPEAK TO HER

## 2020-11-15 ENCOUNTER — Telehealth (HOSPITAL_COMMUNITY): Payer: Self-pay

## 2020-11-15 ENCOUNTER — Ambulatory Visit (HOSPITAL_COMMUNITY): Payer: Medicare Other

## 2020-11-15 NOTE — Telephone Encounter (Signed)
No show, called and left message concerning missed apt today.  Pt currently has no remaining apts and hasn't attended therapy for 3 weeks.  Included contact number to return call regarding wishes to be [place] or resume therapy.  Ihor Austin, LPTA/CLT; Delana Meyer [phone]

## 2020-11-27 ENCOUNTER — Other Ambulatory Visit: Payer: Self-pay

## 2020-11-27 ENCOUNTER — Telehealth (INDEPENDENT_AMBULATORY_CARE_PROVIDER_SITE_OTHER): Payer: Medicare Other | Admitting: Gastroenterology

## 2020-11-27 ENCOUNTER — Encounter (INDEPENDENT_AMBULATORY_CARE_PROVIDER_SITE_OTHER): Payer: Self-pay | Admitting: Gastroenterology

## 2020-11-27 VITALS — Ht 65.0 in | Wt 182.0 lb

## 2020-11-27 DIAGNOSIS — K219 Gastro-esophageal reflux disease without esophagitis: Secondary | ICD-10-CM

## 2020-11-27 MED ORDER — PANTOPRAZOLE SODIUM 40 MG PO TBEC: DELAYED_RELEASE_TABLET | ORAL | 4 refills | Status: DC

## 2020-11-27 NOTE — Addendum Note (Signed)
Addended by: Harvel Quale on: 11/27/2020 09:08 AM   Modules accepted: Orders

## 2020-11-27 NOTE — Progress Notes (Signed)
[name], M.D. Gastroenterology & Hepatology [facility] [address] [place], [place] [address] Primary Care Physician: Asencion Noble, MD [address] [place] [address]   This is a phone visit encounter.  It required patient-provider interaction for the medical decision making as documented below. The patient has consented and agreed to proceed with a phone encounter given the current Coronavirus pandemic.  VIRTUAL VISIT NOTE Patient location: home  Provider location: office  I will communicate my assessment and recommendations to the referring MD via EMR. Note: Occasional unusual wording and randomly placed punctuation marks may result from the use of speech recognition technology to transcribe this document"  Problems: 1.  GERD 2. Epigastric pain  History of Present Illness: Hailey Graham is a 86 y.o. female with previous diagnosis of DCIS treated withlumpectomywho could not toleratetamoxifen and recent diagnosis of L breast cancer, GERD, paroxysmal afib, HLD, depression, who presents for evaluation of epigastric pain and GERD.  Patient states she has tried to be compliant to medication (pantoprazole 40 mg qday), she takes it fasting almost every day. If she misses any dose she does not have any symptoms. She occassionally takes Pepcid three times a week to prevent her symptoms but not due to episodes of heartburn.  She has been asymptomatic denies having any heartburn, dysphagia, odynophagia or more episodes epigastric pain.The patient denies having any nausea, vomiting, fever, chills, hematochezia, melena, hematemesis, abdominal distention, abdominal pain, significant constipation, diarrhea, jaundice, pruritus or weight loss.  States that her only concern right now is that she was recently diagnosed with recurrent breast cancer, planning to undergo surgical treatment at [facility].  Patient  underwent right upper quadrant ultrasound in August 2021 for evaluation of her abdominal pain, no hepatobiliary pathology was noted.  RUQ [place] 08/07/2020: IMPRESSION: 1. Septated cyst in the right lobe of the liver. No other liver lesions evident. Note that there is diffuse increase in liver echogenicity, a finding that diminishes the sensitivity of ultrasound for detection of noncystic liver lesions. 2. 7 x 6 x 6 mm apparent angiomyolipoma in the lower pole right kidney. 3.  Nonobstructing 5 mm calculus mid left kidney. 4. Portions of pancreas obscured by gas. Visualized portions of pancreas appear normal.  Past Medical History: Past Medical History:  Diagnosis Date  . Allergy   . Anxiety   . Arthritis   . Breast cancer of upper-outer quadrant of left female breast ([place]) 11/24/2015  . Breast cancer of upper-outer quadrant of left female breast ([place]) 11/24/2015  . Cataract   . Complication of anesthesia   . Depression   . Diverticulosis   . Gait abnormality   . Gastroesophageal reflux disease   . Glaucoma   . Hyperlipidemia    Lipid profile in 05/2010:149, 86, 48, 84  . Lung nodule    Right lower lobe; stable in 2010  . Paroxysmal atrial fibrillation (HCC)    Rate related right bundle branch block; normal EF on echo in 2006; normal stress nuclear-2006; mild RV hypokinesis on MRI;  RA and RV enlargement on CT in 9/06; Anticoagulation->discontinued in 2009  . Personal history of kidney stones   . PONV (postoperative nausea and vomiting)    Nausea    Past Surgical History: Past Surgical History:  Procedure Laterality Date  . ABDOMINAL HYSTERECTOMY  2202  . ANKLE SURGERY Right 2003    Fracture- rod  . APPENDECTOMY     as a teenager  . BREAST  LUMPECTOMY WITH RADIOACTIVE SEED LOCALIZATION Left 12/13/2015   Procedure: BREAST LUMPECTOMY WITH RADIOACTIVE SEED LOCALIZATION;  Surgeon: Excell Seltzer, MD;  Location: [facility];  Service: General;  Laterality: Left;  . CATARACT EXTRACTION W/  INTRAOCULAR LENS IMPLANT Right 2014  . COLONOSCOPY W/ POLYPECTOMY    . ESOPHAGOGASTRODUODENOSCOPY    . GLAUCOMA SURGERY Right 2014  . HERNIA REPAIR Right    1990's  . ORIF ANKLE FRACTURE  2003   Right  . TONSILLECTOMY     age 26  . Varicose veins Right 2207    Family History: Family History  Problem Relation Age of Onset  . Healthy Mother   . Cerebral aneurysm Father   . Lung cancer Sister     Social History: Social History   Tobacco Use  Smoking Status Never Smoker  Smokeless Tobacco Never Used   Social History   Substance and Sexual Activity  Alcohol Use No  . Alcohol/week: 0.0 standard drinks   Social History   Substance and Sexual Activity  Drug Use No    Allergies: Allergies  Allergen Reactions  . Brimonidine Other (See Comments)    Crusting - difficulty opening eyes  . Codeine     Unspecified  . Morphine     Unspecified  . Sulfa Drugs Cross Reactors Other (See Comments)    Unspecified    Medications: Current Outpatient Medications  Medication Sig Dispense Refill  . acetaminophen (TYLENOL) 500 MG tablet Take 500 mg by mouth as needed for mild pain, moderate pain, fever or headache.    . ALPRAZolam (XANAX) 0.5 MG tablet Take 0.25 mg by mouth 3 (three) times daily as needed for anxiety. Order is 0.05 tid prn    . anastrozole (ARIMIDEX) 1 MG tablet Take 1 tablet (1 mg total) by mouth daily. 90 tablet 3  . Cholecalciferol (VITAMIN D) 2000 UNITS tablet Take 2,000 Units by mouth daily.    . Cyanocobalamin (B-12) 1000 MCG TABS Take by mouth daily.     Marland Kitchen docusate sodium (COLACE) 250 MG capsule Take 250 mg by mouth daily.    . dorzolamide-timolol (COSOPT) 22.3-6.8 MG/ML ophthalmic solution 1 drop 2 (two) times daily.    Marland Kitchen ezetimibe-simvastatin (VYTORIN) 10-20 MG tablet Take 1 tablet by mouth daily. 30 tablet 6  . famotidine (PEPCID) 20 MG tablet Take 1 tablet (20 mg total) by mouth at bedtime.    Marland Kitchen latanoprost (XALATAN) 0.005 % ophthalmic solution Place 1  drop into both eyes.     . metoprolol succinate (TOPROL-XL) 25 MG 24 hr tablet Take 1 tablet (25 mg total) by mouth daily. (Patient taking differently: Take 12.5 mg by mouth daily. ) 90 tablet 3  . nystatin-triamcinolone (MYCOLOG II) cream Apply 1 application topically 2 (two) times daily. 30 g 0  . pantoprazole (PROTONIX) 40 MG tablet TAKE (1) TABLET BY MOUTH EACH MORNING. 90 tablet 3  . rivaroxaban (XARELTO) 20 MG TABS tablet Take 1 tablet (20 mg total) by mouth daily with supper. 30 tablet 11   No current facility-administered medications for this visit.    Review of Systems: GENERAL: negative for malaise, night sweats HEENT: No changes in hearing or vision, no nose bleeds or other nasal problems. NECK: Negative for lumps, goiter, pain and significant neck swelling RESPIRATORY: Negative for cough, wheezing CARDIOVASCULAR: Negative for chest pain, leg swelling, palpitations, orthopnea GI: SEE HPI MUSCULOSKELETAL: Negative for joint pain or swelling, back pain, and muscle pain. SKIN: Negative for lesions, rash PSYCH: Negative for sleep disturbance, mood  disorder and recent psychosocial stressors. HEMATOLOGY Negative for prolonged bleeding, bruising easily, and swollen nodes. ENDOCRINE: Negative for cold or heat intolerance, polyuria, polydipsia and goiter. NEURO: negative for tremor, gait imbalance, syncope and seizures. The remainder of the review of systems is noncontributory.  Physical Exam: Not performed as this was a telephone encounter.  Imaging/Labs: as above  I personally reviewed and interpreted the available labs, imaging and endoscopic files.  Impression and Plan: RAEL TILLY is a 86 y.o. female with previous diagnosis of DCIS treated withlumpectomywho could not toleratetamoxifen and recent diagnosis of L breast cancer, GERD, paroxysmal afib, HLD, depression, who presents for evaluation of epigastric pain and GERD.  Patient has presented improvement of her  symptomatology while on pantoprazole 40 mg every day.  I consider her GERD symptoms have been adequately controlled while taking the medication.  It is unclear if famotidine is helping her but I think this is less likely and most of her disease control is related to intake of pantoprazole, which she should continue at the same dose right now.  No need for endoscopic evaluation at this point unless she has recurrent symptoms or any red flag signs.  Finally, advised the patient to stop taking famotidine and only take it as needed if she has any recurrent symptoms.  She understood and agreed.  - Continue pantoprazole 40 mg qday - Famotidine 20 mg as needed if recurrent symptoms - RTC 1 year  All questions were answered.      Encounter time: I spent a total of 20 minutes  [name], MD Gastroenterology and Hepatology [facility]

## 2020-11-28 ENCOUNTER — Ambulatory Visit (INDEPENDENT_AMBULATORY_CARE_PROVIDER_SITE_OTHER): Payer: Medicare Other | Admitting: Internal Medicine

## 2020-11-30 ENCOUNTER — Ambulatory Visit (INDEPENDENT_AMBULATORY_CARE_PROVIDER_SITE_OTHER): Payer: Medicare Other | Admitting: Gastroenterology

## 2020-11-30 DIAGNOSIS — C50412 Malignant neoplasm of upper-outer quadrant of left female breast: Secondary | ICD-10-CM | POA: Diagnosis not present

## 2020-12-01 ENCOUNTER — Ambulatory Visit: Payer: Medicare Other | Admitting: Student

## 2020-12-01 ENCOUNTER — Encounter: Payer: Self-pay | Admitting: Student

## 2020-12-01 ENCOUNTER — Other Ambulatory Visit: Payer: Self-pay

## 2020-12-01 VITALS — BP 126/76 | HR 60 | Ht 67.0 in | Wt 182.0 lb

## 2020-12-01 DIAGNOSIS — C50412 Malignant neoplasm of upper-outer quadrant of left female breast: Secondary | ICD-10-CM

## 2020-12-01 DIAGNOSIS — I480 Paroxysmal atrial fibrillation: Secondary | ICD-10-CM | POA: Diagnosis not present

## 2020-12-01 DIAGNOSIS — E785 Hyperlipidemia, unspecified: Secondary | ICD-10-CM

## 2020-12-01 MED ORDER — EZETIMIBE-SIMVASTATIN 10-20 MG PO TABS: 1.0000 | ORAL_TABLET | Freq: Every day | ORAL | 3 refills | Status: DC

## 2020-12-01 NOTE — Progress Notes (Signed)
Cardiology Office Note    Date:  12/02/2020   ID:  SHANTERA MONTS, DOB 1928-09-20, MRN [id number]  PCP:  Asencion Noble, MD  Cardiologist: Kate Sable, MD (Inactive)  --> Needs to switch to new MD at next visit  Chief Complaint  Patient presents with   Follow-up    Annual Visit    History of Present Illness:    Hailey Graham is a 86 y.o. female with past medical history of paroxysmal atrial fibrillation, HLD, varicose veins, and history of breast cancer (s/p lumpectomy in 2016) who presents to the office today for 1-year follow-up.   She was last examined by myself in 10/2019 and denied any recent chest pain or palpitations at that time. She did have chronic lower extremity edema in the setting of varicose veins. Was scheduled to have an MRI of her spine given progressive back pain. She was continued on her current medication regimen with Xarelto 20mg  daily, Toprol-XL 12.5mg  daily and Vytorin 10-20mg  daily.   In talking with the patient today, she reports she was recently diagnosed with recurrent breast cancer along her left breast but at this time is going to be on Arimidex with plans for a repeat mammogram in 3 months. She is hopeful she will not require surgical intervention.  She reports overall doing well from a cardiac perspective since her last visit. She does have baseline dyspnea on exertion but denies any acute changes in this. She continues to live by herself and performs all of her household chores independently. She denies any recent chest pain or palpitations. No recent orthopnea or PND.  She does have lower extremity edema but was previously intolerant to diuretic therapy secondary to dehydration and has difficulty wearing compression stockings    Past Medical History:  Diagnosis Date   Allergy    Anxiety    Arthritis    Breast cancer of upper-outer quadrant of left female breast ([place]) 11/24/2015   Breast cancer of upper-outer quadrant of left female breast  ([place]) 11/24/2015   Cataract    Complication of anesthesia    Depression    Diverticulosis    Gait abnormality    Gastroesophageal reflux disease    Glaucoma    Hyperlipidemia    Lipid profile in 05/2010:149, 86, 48, 84   Lung nodule    Right lower lobe; stable in 2010   Paroxysmal atrial fibrillation (HCC)    Rate related right bundle branch block; normal EF on echo in 2006; normal stress nuclear-2006; mild RV hypokinesis on MRI;  RA and RV enlargement on CT in 9/06; Anticoagulation->discontinued in 2009   Personal history of kidney stones    PONV (postoperative nausea and vomiting)    Nausea    Past Surgical History:  Procedure Laterality Date   ABDOMINAL HYSTERECTOMY  2202   ANKLE SURGERY Right 2003    Fracture- rod   APPENDECTOMY     as a teenager   BREAST LUMPECTOMY WITH RADIOACTIVE SEED LOCALIZATION Left 12/13/2015   Procedure: BREAST LUMPECTOMY WITH RADIOACTIVE SEED LOCALIZATION;  Surgeon: Excell Seltzer, MD;  Location: [facility];  Service: General;  Laterality: Left;   CATARACT EXTRACTION W/ INTRAOCULAR LENS IMPLANT Right 2014   COLONOSCOPY W/ POLYPECTOMY     ESOPHAGOGASTRODUODENOSCOPY     GLAUCOMA SURGERY Right 2014   HERNIA REPAIR Right    1990's   ORIF ANKLE FRACTURE  2003   Right   TONSILLECTOMY     age 76   Varicose veins Right 2207  Current Medications: Outpatient Medications Prior to Visit  Medication Sig Dispense Refill   acetaminophen (TYLENOL) 500 MG tablet Take 500 mg by mouth as needed for mild pain, moderate pain, fever or headache.     ALPRAZolam (XANAX) 0.5 MG tablet Take 0.25 mg by mouth 3 (three) times daily as needed for anxiety. Order is 0.05 tid prn     anastrozole (ARIMIDEX) 1 MG tablet Take 1 tablet (1 mg total) by mouth daily. 90 tablet 3   Cholecalciferol (VITAMIN D) 2000 UNITS tablet Take 2,000 Units by mouth daily.     Cyanocobalamin (B-12) 1000 MCG TABS Take by mouth daily.      docusate sodium (COLACE) 250  MG capsule Take 250 mg by mouth daily.     dorzolamide-timolol (COSOPT) 22.3-6.8 MG/ML ophthalmic solution 1 drop 2 (two) times daily.     famotidine (PEPCID) 20 MG tablet Take 1 tablet (20 mg total) by mouth at bedtime.     latanoprost (XALATAN) 0.005 % ophthalmic solution Place 1 drop into both eyes.      metoprolol succinate (TOPROL-XL) 25 MG 24 hr tablet Take 1 tablet (25 mg total) by mouth daily. (Patient taking differently: Take 12.5 mg by mouth daily.) 90 tablet 3   nystatin-triamcinolone (MYCOLOG II) cream Apply 1 application topically 2 (two) times daily. 30 g 0   pantoprazole (PROTONIX) 40 MG tablet TAKE (1) TABLET BY MOUTH EACH MORNING. 90 tablet 4   pilocarpine (PILOCAR) 1 % ophthalmic solution Place 1 drop into the right eye 3 times daily.     rivaroxaban (XARELTO) 20 MG TABS tablet Take 1 tablet (20 mg total) by mouth daily with supper. 30 tablet 11   ezetimibe-simvastatin (VYTORIN) 10-20 MG tablet Take 1 tablet by mouth daily. 30 tablet 6   ciprofloxacin (CIPRO) 250 MG tablet Take 250 mg by mouth 2 (two) times daily.     No facility-administered medications prior to visit.     Allergies:   Brimonidine, Codeine, Morphine, and Sulfa drugs cross reactors   Social History   Socioeconomic History   Marital status: Widowed    Spouse name: Not on file   Number of children: 0   Years of education: 15   Highest education level: Not on file  Occupational History   Occupation: Retired  Tobacco Use   Smoking status: Never Smoker   Smokeless tobacco: Never Used  Scientific laboratory technician Use: Never used  Substance and Sexual Activity   Alcohol use: No    Alcohol/week: 0.0 standard drinks   Drug use: No   Sexual activity: Not Currently  Other Topics Concern   Not on file  Social History Narrative   Lives at home with husband.   Right-handed.   No caffeine use.   Social Determinants of Health   Financial Resource Strain: Not on file  Food Insecurity: Not  on file  Transportation Needs: Not on file  Physical Activity: Not on file  Stress: Not on file  Social Connections: Not on file     Family History:  The patient's family history includes Cerebral aneurysm in her father; Healthy in her mother; Lung cancer in her sister.   Review of Systems:   Please see the history of present illness.     General:  No chills, fever, night sweats or weight changes.  Cardiovascular:  No chest pain, dyspnea on exertion, orthopnea, palpitations, paroxysmal nocturnal dyspnea. Positive for edema.  Dermatological: No rash, lesions/masses Respiratory: No cough, dyspnea Urologic: No hematuria,  dysuria Abdominal:   No nausea, vomiting, diarrhea, bright red blood per rectum, melena, or hematemesis Neurologic:  No visual changes, wkns, changes in mental status. All other systems reviewed and are otherwise negative except as noted above.   Physical Exam:    VS:  BP 126/76    Pulse 60    Ht 5\' 7"  (1.702 m)    Wt 182 lb (82.6 kg)    SpO2 98%    BMI 28.51 kg/m    General: Well developed, elderly female appearing in no acute distress. Head: Normocephalic, atraumatic. Neck: No carotid bruits. JVD not elevated.  Lungs: Respirations regular and unlabored, without wheezes or rales.  Heart: Regular rate and rhythm. No S3 or S4.  No murmur, no rubs, or gallops appreciated. Abdomen: Appears non-distended. No obvious abdominal masses. Msk:  Strength and tone appear normal for age. No obvious joint deformities or effusions. Extremities: No clubbing or cyanosis. Trace edema. Varicose veins noted.  Distal pedal pulses are 2+ bilaterally. Neuro: Alert and oriented X 3. Moves all extremities spontaneously. No focal deficits noted. Psych:  Responds to questions appropriately with a normal affect. Skin: No rashes or lesions noted  Wt Readings from Last 3 Encounters:  12/01/20 182 lb (82.6 kg)  11/27/20 182 lb (82.6 kg)  11/07/20 182 lb 6.4 oz (82.7 kg)    Studies/Labs  Reviewed:   EKG:  EKG is ordered today.  The ekg ordered today demonstrates NSR, HR 68 with artifact. Known RBBB. No acute changes.   Recent Labs: No results found for requested labs within last 8760 hours.   Lipid Panel    Component Value Date/Time   CHOL 137 07/23/2019 0000   TRIG 97 07/23/2019 0000   HDL 45 07/23/2019 0000   CHOLHDL 2.9 11/12/2017 0845   VLDL 13 11/12/2017 0845   LDLCALC 73 07/23/2019 0000    Additional studies/ records that were reviewed today include:   Echocardiogram: 09/2019 IMPRESSIONS    1. Left ventricular ejection fraction, by visual estimation, is 60 to  65%. The left ventricle has normal function. There is moderately increased  left ventricular hypertrophy.  2. Left ventricular diastolic parameters are consistent with Grade I  diastolic dysfunction (impaired relaxation).  3. Global right ventricle has normal systolic function.The right  ventricular size is normal. No increase in right ventricular wall  thickness.  4. Left atrial size was normal.  5. Right atrial size was normal.  6. The mitral valve is grossly normal. No evidence of mitral valve  regurgitation.  7. The tricuspid valve is grossly normal. Tricuspid valve regurgitation  is trivial.  8. The aortic valve is tricuspid. Aortic valve regurgitation is mild.  Mild aortic valve sclerosis without stenosis.  9. The pulmonic valve was grossly normal. Pulmonic valve regurgitation is  not visualized.  10. The inferior vena cava is normal in size with greater than 50%  respiratory variability, suggesting right atrial pressure of 3 mmHg.   Assessment:    1. Paroxysmal atrial fibrillation (HCC)   2. Hyperlipidemia, unspecified hyperlipidemia type   3. Malignant neoplasm of upper-outer quadrant of left female breast, unspecified estrogen receptor status ([place])      Plan:   In order of problems listed above:  1. Paroxysmal Atrial Fibrillation - She denies any recent  palpitations and is in NSR by examination and EKG today. Continue Toprol-XL 12.5mg  daily for rate-control (previously had fatigue when taking 25mg  daily).  - She denies any evidence of active bleeding. Remains on Xarelto for  anticoagulation. Will request most recent labs from PCP to make sure she remains on the appropriate dose (creatinine clearance was at 70 mL/min when calculated off most recent labs available).   2. HLD - Followed by PCP. Will request most recent labs. She remains on Vytorin and reports this is going to be covered well by her insurance next year. If not, we discussed switching to Zetia and Simvastatin alone.   3. Breast Cancer - She is s/p lumpectomy in 2016 and was recently diagnosed with recurrent breast cancer along her left breast but at this time is going to be on Arimidex with plans for a repeat mammogram in 3 months. Followed by Oncology.    Medication Adjustments/Labs and Tests Ordered: Current medicines are reviewed at length with the patient today.  Concerns regarding medicines are outlined above.  Medication changes, Labs and Tests ordered today are listed in the Patient Instructions below. Patient Instructions  Medication Instructions:  Your physician recommends that you continue on your current medications as directed. Please refer to the Current Medication list given to you today.  *If you need a refill on your cardiac medications before your next appointment, please call your pharmacy*   Lab Work: None Today If you have labs (blood work) drawn today and your tests are completely normal, you will receive your results only by:  [place] (if you have MyChart) OR  A paper copy in the mail If you have any lab test that is abnormal or we need to change your treatment, we will call you to review the results.   Testing/Procedures: None Today   Follow-Up: At [facility], you and your health needs are our priority.  As part of our continuing  mission to provide you with exceptional heart care, we have created designated Provider Care Teams.  These Care Teams include your primary Cardiologist (physician) and Advanced Practice Providers (APPs -  Physician Assistants and Nurse Practitioners) who all work together to provide you with the care you need, when you need it.  We recommend signing up for the patient portal called "MyChart".  Sign up information is provided on this After Visit Summary.  MyChart is used to connect with patients for Virtual Visits (Telemedicine).  Patients are able to view lab/test results, encounter notes, upcoming appointments, etc.  Non-urgent messages can be sent to your provider as well.   To learn more about what you can do with MyChart, go to [email or web address].    Your next appointment:   12 month(s)  The format for your next appointment:   In Person  Provider:   Bernerd Pho, PA-C   Other Instructions None Today        Signed, Erma Heritage, PA-C  12/02/2020 8:02 AM    [facility]. [address] [place], [place] [address] Phone: [phone] Fax: [phone]

## 2020-12-01 NOTE — Patient Instructions (Signed)
Medication Instructions:  Your physician recommends that you continue on your current medications as directed. Please refer to the Current Medication list given to you today.  *If you need a refill on your cardiac medications before your next appointment, please call your pharmacy*   Lab Work: None Today If you have labs (blood work) drawn today and your tests are completely normal, you will receive your results only by: Marland Kitchen MyChart Message (if you have MyChart) OR . A paper copy in the mail If you have any lab test that is abnormal or we need to change your treatment, we will call you to review the results.   Testing/Procedures: None Today   Follow-Up: At [facility], you and your health needs are our priority.  As part of our continuing mission to provide you with exceptional heart care, we have created designated Provider Care Teams.  These Care Teams include your primary Cardiologist (physician) and Advanced Practice Providers (APPs -  Physician Assistants and Nurse Practitioners) who all work together to provide you with the care you need, when you need it.  We recommend signing up for the patient portal called "MyChart".  Sign up information is provided on this After Visit Summary.  MyChart is used to connect with patients for Virtual Visits (Telemedicine).  Patients are able to view lab/test results, encounter notes, upcoming appointments, etc.  Non-urgent messages can be sent to your provider as well.   To learn more about what you can do with MyChart, go to [email or web address].    Your next appointment:   12 month(s)  The format for your next appointment:   In Person  Provider:   Bernerd Pho, PA-C   Other Instructions None Today

## 2020-12-20 ENCOUNTER — Telehealth: Payer: Self-pay | Admitting: *Deleted

## 2020-12-20 NOTE — Telephone Encounter (Signed)
Pt requesting samples of Xatelto 20 (Lot [id number],EXP: 08-23) . Samples placed at front desk for pick up.   Pt notified

## 2020-12-27 ENCOUNTER — Encounter: Payer: Self-pay | Admitting: Hematology and Oncology

## 2021-01-24 DIAGNOSIS — M1711 Unilateral primary osteoarthritis, right knee: Secondary | ICD-10-CM | POA: Diagnosis not present

## 2021-01-24 DIAGNOSIS — M170 Bilateral primary osteoarthritis of knee: Secondary | ICD-10-CM | POA: Diagnosis not present

## 2021-02-05 DIAGNOSIS — H401411 Capsular glaucoma with pseudoexfoliation of lens, right eye, mild stage: Secondary | ICD-10-CM | POA: Diagnosis not present

## 2021-02-05 DIAGNOSIS — I480 Paroxysmal atrial fibrillation: Secondary | ICD-10-CM | POA: Diagnosis not present

## 2021-02-05 DIAGNOSIS — H401423 Capsular glaucoma with pseudoexfoliation of lens, left eye, severe stage: Secondary | ICD-10-CM | POA: Diagnosis not present

## 2021-02-05 DIAGNOSIS — R2689 Other abnormalities of gait and mobility: Secondary | ICD-10-CM | POA: Diagnosis not present

## 2021-02-05 DIAGNOSIS — C50912 Malignant neoplasm of unspecified site of left female breast: Secondary | ICD-10-CM | POA: Diagnosis not present

## 2021-02-06 ENCOUNTER — Other Ambulatory Visit: Payer: Self-pay | Admitting: Internal Medicine

## 2021-02-06 DIAGNOSIS — R2689 Other abnormalities of gait and mobility: Secondary | ICD-10-CM

## 2021-02-19 ENCOUNTER — Ambulatory Visit (HOSPITAL_COMMUNITY): Payer: Medicare Other

## 2021-02-20 ENCOUNTER — Ambulatory Visit (HOSPITAL_COMMUNITY)
Admission: RE | Admit: 2021-02-20 | Discharge: 2021-02-20 | Disposition: A | Discharge status: Home / Self Care | Payer: Medicare Other | Source: Ambulatory Visit | Attending: Internal Medicine | Admitting: Internal Medicine

## 2021-02-20 ENCOUNTER — Telehealth: Payer: Self-pay | Admitting: *Deleted

## 2021-02-20 ENCOUNTER — Other Ambulatory Visit: Payer: Self-pay

## 2021-02-20 DIAGNOSIS — I6782 Cerebral ischemia: Secondary | ICD-10-CM | POA: Diagnosis not present

## 2021-02-20 DIAGNOSIS — G319 Degenerative disease of nervous system, unspecified: Secondary | ICD-10-CM | POA: Diagnosis not present

## 2021-02-20 DIAGNOSIS — R42 Dizziness and giddiness: Secondary | ICD-10-CM | POA: Diagnosis not present

## 2021-02-20 DIAGNOSIS — R2689 Other abnormalities of gait and mobility: Secondary | ICD-10-CM | POA: Insufficient documentation

## 2021-02-20 MED ORDER — GADOBUTROL 1 MMOL/ML IV SOLN
7.0000 mL | Freq: Once | INTRAVENOUS | Status: AC | PRN
  Administered 2021-02-20: 7 mL via INTRAVENOUS

## 2021-02-20 NOTE — Telephone Encounter (Signed)
Pt walked into office and reports SOB on exertion. Pt denies CP, nausea and sweating. Pt states that when she is doing housework she becomes SOB. Pt states that she is not SOB when sitting or lying down. Pt requesting to see B.Strader. Appt made and pt notified.

## 2021-03-05 DIAGNOSIS — H401413 Capsular glaucoma with pseudoexfoliation of lens, right eye, severe stage: Secondary | ICD-10-CM | POA: Diagnosis not present

## 2021-03-05 DIAGNOSIS — H401423 Capsular glaucoma with pseudoexfoliation of lens, left eye, severe stage: Secondary | ICD-10-CM | POA: Diagnosis not present

## 2021-03-06 ENCOUNTER — Other Ambulatory Visit: Payer: Self-pay

## 2021-03-06 ENCOUNTER — Encounter: Payer: Self-pay | Admitting: Student

## 2021-03-06 ENCOUNTER — Ambulatory Visit: Payer: Medicare Other | Admitting: Student

## 2021-03-06 VITALS — BP 116/82 | HR 78 | Ht 66.0 in | Wt 178.0 lb

## 2021-03-06 DIAGNOSIS — E785 Hyperlipidemia, unspecified: Secondary | ICD-10-CM

## 2021-03-06 DIAGNOSIS — I480 Paroxysmal atrial fibrillation: Secondary | ICD-10-CM | POA: Diagnosis not present

## 2021-03-06 DIAGNOSIS — R0600 Dyspnea, unspecified: Secondary | ICD-10-CM | POA: Diagnosis not present

## 2021-03-06 DIAGNOSIS — R0609 Other forms of dyspnea: Secondary | ICD-10-CM

## 2021-03-06 MED ORDER — METOPROLOL SUCCINATE ER 25 MG PO TB24: 12.5000 mg | ORAL_TABLET | Freq: Two times a day (BID) | ORAL | 3 refills | Status: DC

## 2021-03-06 MED ORDER — METOPROLOL SUCCINATE ER 25 MG PO TB24: 12.5000 mg | ORAL_TABLET | Freq: Every day | ORAL | 3 refills | Status: DC

## 2021-03-06 NOTE — Patient Instructions (Addendum)
Medication Instructions:  INCREASE Toprol XL to 12.5 mg twice daily  *If you need a refill on your cardiac medications before your next appointment, please call your pharmacy*   Lab Work: None If you have labs (blood work) drawn today and your tests are completely normal, you will receive your results only by: Marland Kitchen MyChart Message (if you have MyChart) OR . A paper copy in the mail If you have any lab test that is abnormal or we need to change your treatment, we will call you to review the results.   Testing/Procedures: None   Follow-Up: At [facility], you and your health needs are our priority.  As part of our continuing mission to provide you with exceptional heart care, we have created designated Provider Care Teams.  These Care Teams include your primary Cardiologist (physician) and Advanced Practice Providers (APPs -  Physician Assistants and Nurse Practitioners) who all work together to provide you with the care you need, when you need it.  We recommend signing up for the patient portal called "MyChart".  Sign up information is provided on this After Visit Summary.  MyChart is used to connect with patients for Virtual Visits (Telemedicine).  Patients are able to view lab/test results, encounter notes, upcoming appointments, etc.  Non-urgent messages can be sent to your provider as well.   To learn more about what you can do with MyChart, go to [email or web address].    Your next appointment:   6 month(s)  The format for your next appointment:   In Person  Provider:   Bernerd Pho, PA-C or establish with MD   Other Instructions Call our office if you would like to schedule a stress test  Would recommend purchasing a pulse oximeter.  Oxygen saturations should be greater than 90%.   Heart Rate/Pulse should be between 60-100 beats per minute.   Xarelto Samples 20 mg (#14) Lot#:[id number] Exp: 08/23

## 2021-03-06 NOTE — Progress Notes (Signed)
Cardiology Office Note    Date:  03/06/2021   ID:  Hailey Graham, DOB 1928-12-12, MRN [id number]  PCP:  Asencion Noble, MD  Cardiologist: Previously Dr. Bronson Ing  Chief Complaint  Patient presents with  . Follow-up    Dyspnea on Exertion    History of Present Illness:    Hailey Graham is a 87 y.o. female with past medical history of paroxysmal atrial fibrillation, HLD, varicose veins, and history of breast cancer (s/plumpectomy in 2016) who presents to the office today for evaluation of worsening shortness of breath.  She was last examined by myself in 11/2020 reported having baseline dyspnea on exertion but denied any acute changes in her symptoms. She denied any recent chest pain or palpitations. Was continued on her current medication regimen including Toprol-XL 12.5 mg daily for rate control along with Xarelto for anticoagulation.  She did call the office on 02/20/2021 reporting worsening dyspnea on exertion and a follow-up visit was arranged.  In talking with the patient today, she reports having dyspnea if over-exerting herself such as moving a table in her home or with walking for long distances. No symptoms when doing her routine activities. No associated chest pain or palpitations. She has noticed she can see her pulse in her arm at times but is asymptomatic with this. No recent orthopnea, PND or edema. She reports good compliance with Xarelto and denies any recent melena, hematochezia or hematuria.    Past Medical History:  Diagnosis Date  . Allergy   . Anxiety   . Arthritis   . Breast cancer of upper-outer quadrant of left female breast ([place]) 11/24/2015  . Breast cancer of upper-outer quadrant of left female breast ([place]) 11/24/2015  . Cataract   . Complication of anesthesia   . Depression   . Diverticulosis   . Gait abnormality   . Gastroesophageal reflux disease   . Glaucoma   . Hyperlipidemia    Lipid profile in 05/2010:149, 86, 48, 84  . Lung nodule    Right  lower lobe; stable in 2010  . Paroxysmal atrial fibrillation (HCC)    Rate related right bundle branch block; normal EF on echo in 2006; normal stress nuclear-2006; mild RV hypokinesis on MRI;  RA and RV enlargement on CT in 9/06; Anticoagulation->discontinued in 2009  . Personal history of kidney stones   . PONV (postoperative nausea and vomiting)    Nausea    Past Surgical History:  Procedure Laterality Date  . ABDOMINAL HYSTERECTOMY  2202  . ANKLE SURGERY Right 2003    Fracture- rod  . APPENDECTOMY     as a teenager  . BREAST LUMPECTOMY WITH RADIOACTIVE SEED LOCALIZATION Left 12/13/2015   Procedure: BREAST LUMPECTOMY WITH RADIOACTIVE SEED LOCALIZATION;  Surgeon: Excell Seltzer, MD;  Location: [facility];  Service: General;  Laterality: Left;  . CATARACT EXTRACTION W/ INTRAOCULAR LENS IMPLANT Right 2014  . COLONOSCOPY W/ POLYPECTOMY    . ESOPHAGOGASTRODUODENOSCOPY    . GLAUCOMA SURGERY Right 2014  . HERNIA REPAIR Right    1990's  . ORIF ANKLE FRACTURE  2003   Right  . TONSILLECTOMY     age 25  . Varicose veins Right 2207    Current Medications: Outpatient Medications Prior to Visit  Medication Sig Dispense Refill  . acetaminophen (TYLENOL) 500 MG tablet Take 500 mg by mouth as needed for mild pain, moderate pain, fever or headache.    . ALPRAZolam (XANAX) 0.5 MG tablet Take 0.25 mg by mouth 3 (three)  times daily as needed for anxiety. Order is 0.05 tid prn    . anastrozole (ARIMIDEX) 1 MG tablet Take 1 tablet (1 mg total) by mouth daily. 90 tablet 3  . Cholecalciferol (VITAMIN D) 2000 UNITS tablet Take 2,000 Units by mouth daily.    . Cyanocobalamin (B-12) 1000 MCG TABS Take by mouth daily.     Marland Kitchen docusate sodium (COLACE) 250 MG capsule Take 250 mg by mouth daily.    . dorzolamide-timolol (COSOPT) 22.3-6.8 MG/ML ophthalmic solution 1 drop 2 (two) times daily.    Marland Kitchen ezetimibe-simvastatin (VYTORIN) 10-20 MG tablet Take 1 tablet by mouth daily. 90 tablet 3  . famotidine (PEPCID) 20  MG tablet Take 1 tablet (20 mg total) by mouth at bedtime.    Marland Kitchen latanoprost (XALATAN) 0.005 % ophthalmic solution Place 1 drop into both eyes.     Marland Kitchen nystatin-triamcinolone (MYCOLOG II) cream Apply 1 application topically 2 (two) times daily. 30 g 0  . pantoprazole (PROTONIX) 40 MG tablet TAKE (1) TABLET BY MOUTH EACH MORNING. 90 tablet 4  . pilocarpine (PILOCAR) 1 % ophthalmic solution Place 1 drop into the right eye 3 times daily.    . rivaroxaban (XARELTO) 20 MG TABS tablet Take 1 tablet (20 mg total) by mouth daily with supper. 30 tablet 11  . metoprolol succinate (TOPROL-XL) 25 MG 24 hr tablet Take 1 tablet (25 mg total) by mouth daily. (Patient taking differently: Take 12.5 mg by mouth daily.) 90 tablet 3  . ciprofloxacin (CIPRO) 250 MG tablet Take 250 mg by mouth 2 (two) times daily. (Patient not taking: Reported on 03/06/2021)     No facility-administered medications prior to visit.     Allergies:   Brimonidine, Codeine, Morphine, and Sulfa drugs cross reactors   Social History   Socioeconomic History  . Marital status: Widowed    Spouse name: Not on file  . Number of children: 0  . Years of education: 53  . Highest education level: Not on file  Occupational History  . Occupation: Retired  Tobacco Use  . Smoking status: Never Smoker  . Smokeless tobacco: Never Used  Vaping Use  . Vaping Use: Never used  Substance and Sexual Activity  . Alcohol use: No    Alcohol/week: 0.0 standard drinks  . Drug use: No  . Sexual activity: Not Currently  Other Topics Concern  . Not on file  Social History Narrative   Lives at home with husband.   Right-handed.   No caffeine use.   Social Determinants of Health   Financial Resource Strain: Not on file  Food Insecurity: Not on file  Transportation Needs: Not on file  Physical Activity: Not on file  Stress: Not on file  Social Connections: Not on file     Family History:  The patient's family history includes Cerebral aneurysm in  her father; Healthy in her mother; Lung cancer in her sister.   Review of Systems:   Please see the history of present illness.     General:  No chills, fever, night sweats or weight changes.  Cardiovascular:  No chest pain, edema, orthopnea, palpitations, paroxysmal nocturnal dyspnea. Positive for dyspnea on exertion.  Dermatological: No rash, lesions/masses Respiratory: No cough, dyspnea Urologic: No hematuria, dysuria Abdominal:   No nausea, vomiting, diarrhea, bright red blood per rectum, melena, or hematemesis Neurologic:  No visual changes, wkns, changes in mental status. All other systems reviewed and are otherwise negative except as noted above.   Physical Exam:  VS:  BP 116/82   Pulse 78   Ht 5\' 6"  (1.676 m)   Wt 178 lb (80.7 kg)   SpO2 98%   BMI 28.73 kg/m    General: Well developed, well nourished,female appearing in no acute distress. Head: Normocephalic, atraumatic. Neck: No carotid bruits. JVD not elevated.  Lungs: Respirations regular and unlabored, without wheezes or rales.  Heart: Regular rate and rhythm. No S3 or S4.  No murmur, no rubs, or gallops appreciated. Abdomen: Appears non-distended. No obvious abdominal masses. Msk:  Strength and tone appear normal for age. No obvious joint deformities or effusions. Extremities: No clubbing or cyanosis. No edema.  Distal pedal pulses are 2+ bilaterally. Varicose veins present.  Neuro: Alert and oriented X 3. Moves all extremities spontaneously. No focal deficits noted. Psych:  Responds to questions appropriately with a normal affect. Skin: No rashes or lesions noted  Wt Readings from Last 3 Encounters:  03/06/21 178 lb (80.7 kg)  12/01/20 182 lb (82.6 kg)  11/27/20 182 lb (82.6 kg)     Studies/Labs Reviewed:   EKG:  EKG is ordered today. The ekg ordered today demonstrates NSR, HR 77 with known RBBB.   Recent Labs: No results found for requested labs within last 8760 hours.   Lipid Panel    Component  Value Date/Time   CHOL 137 07/23/2019 0000   TRIG 97 07/23/2019 0000   HDL 45 07/23/2019 0000   CHOLHDL 2.9 11/12/2017 0845   VLDL 13 11/12/2017 0845   LDLCALC 73 07/23/2019 0000    Additional studies/ records that were reviewed today include:   Echocardiogram: 09/2019 IMPRESSIONS    1. Left ventricular ejection fraction, by visual estimation, is 60 to  65%. The left ventricle has normal function. There is moderately increased  left ventricular hypertrophy.  2. Left ventricular diastolic parameters are consistent with Grade I  diastolic dysfunction (impaired relaxation).  3. Global right ventricle has normal systolic function.The right  ventricular size is normal. No increase in right ventricular wall  thickness.  4. Left atrial size was normal.  5. Right atrial size was normal.  6. The mitral valve is grossly normal. No evidence of mitral valve  regurgitation.  7. The tricuspid valve is grossly normal. Tricuspid valve regurgitation  is trivial.  8. The aortic valve is tricuspid. Aortic valve regurgitation is mild.  Mild aortic valve sclerosis without stenosis.  9. The pulmonic valve was grossly normal. Pulmonic valve regurgitation is  not visualized.  10. The inferior vena cava is normal in size with greater than 50%  respiratory variability, suggesting right atrial pressure of 3 mmHg.   Assessment:    1. Dyspnea on exertion   2. Paroxysmal atrial fibrillation (HCC)   3. Hyperlipidemia, unspecified hyperlipidemia type      Plan:   In order of problems listed above:  1. Dyspnea on Exertion - By her description, symptoms are infrequent and occur if over-exerting herself. No symptoms with typical activities and no associated chest pain or palpitations.  - She does feel like her pulse is elevated at times but denies any palpitations and has not checked her HR at home. We reviewed she could purchase a pulse oximeter at home to check her HR and oxygen saturation.   - She did have recent labs with her PCP and will request a copy to make sure her Hgb has been stable given the use of anticoagulation. We discussed possibly obtaining a stress test for ischemic evaluation but she prefers to  hold off on this for now. I encouraged her to call back if wishing to go forward with this.   2. Paroxysmal Atrial Fibrillation - She denies any recent palpitations but is concerned her HR might be elevated at times. She will start checking this as outlined above. She is currently on Toprol-XL 12.5mg  daily and we reviewed she could try taking 12.5mg  BID to see if this helps with her symptoms (previously intolerant to taking 25mg  once daily).  - She denies any evidence of active bleeding. Creatinine clearance at 73 mL/min based off labs in 08/2020. Remains on Xarelto 20mg  daily.   3. HLD - Followed by PCP. She remains on Vytorin 10-20mg  daily.    Medication Adjustments/Labs and Tests Ordered: Current medicines are reviewed at length with the patient today.  Concerns regarding medicines are outlined above.  Medication changes, Labs and Tests ordered today are listed in the Patient Instructions below. Patient Instructions  Medication Instructions:  INCREASE Toprol XL to 12.5 mg twice daily  *If you need a refill on your cardiac medications before your next appointment, please call your pharmacy*   Lab Work: None If you have labs (blood work) drawn today and your tests are completely normal, you will receive your results only by: Marland Kitchen MyChart Message (if you have MyChart) OR . A paper copy in the mail If you have any lab test that is abnormal or we need to change your treatment, we will call you to review the results.   Testing/Procedures: None   Follow-Up: At [facility], you and your health needs are our priority.  As part of our continuing mission to provide you with exceptional heart care, we have created designated Provider Care Teams.  These Care Teams include  your primary Cardiologist (physician) and Advanced Practice Providers (APPs -  Physician Assistants and Nurse Practitioners) who all work together to provide you with the care you need, when you need it.  We recommend signing up for the patient portal called "MyChart".  Sign up information is provided on this After Visit Summary.  MyChart is used to connect with patients for Virtual Visits (Telemedicine).  Patients are able to view lab/test results, encounter notes, upcoming appointments, etc.  Non-urgent messages can be sent to your provider as well.   To learn more about what you can do with MyChart, go to [email or web address].    Your next appointment:   6 month(s)  The format for your next appointment:   In Person  Provider:   Bernerd Pho, PA-C or establish with MD   Other Instructions Call our office if you would like to schedule a stress test  Would recommend purchasing a pulse oximeter.  Oxygen saturations should be greater than 90%.   Heart Rate/Pulse should be between 60-100 beats per minute.   Xarelto Samples 20 mg (#14) Lot#:[id number] Exp: 08/23     Signed, Erma Heritage, PA-C  03/06/2021 7:51 PM    [facility] [address] [place], [place] [address] Phone: [phone] Fax: [phone]

## 2021-03-13 DIAGNOSIS — L821 Other seborrheic keratosis: Secondary | ICD-10-CM | POA: Diagnosis not present

## 2021-03-19 ENCOUNTER — Encounter (HOSPITAL_COMMUNITY): Payer: Self-pay | Admitting: Physical Therapy

## 2021-03-19 NOTE — Therapy (Signed)
[facility] [place], [place], [address] Phone: [phone]   Fax:  [phone]  Patient Details  Name: Hailey Graham MRN: [id number] Date of Birth: Jul 15, 1928 Referring Provider:  No ref. provider found  Encounter Date: 03/19/2021   PHYSICAL THERAPY DISCHARGE SUMMARY  Visits from Start of Care: 4  Current functional level related to goals / functional outcomes: Unknown as patient has not returned.    Remaining deficits: Unknown as patient has not returned.    Education / Equipment: HEP  Plan: Patient agrees to discharge.  Patient goals were not met. Patient is being discharged due to not returning since the last visit.  ?????      10:59 AM, 03/19/21 Mearl Latin PT, DPT Physical Therapist at [place], [place], [address] Phone: [phone]   Fax:  [phone]

## 2021-03-22 ENCOUNTER — Ambulatory Visit: Payer: Medicare Other | Admitting: Student

## 2021-03-28 ENCOUNTER — Telehealth: Payer: Self-pay | Admitting: Cardiology

## 2021-03-28 MED ORDER — RIVAROXABAN 20 MG PO TABS: 20.0000 mg | ORAL_TABLET | Freq: Every day | ORAL | 3 refills | Status: DC

## 2021-03-28 NOTE — Telephone Encounter (Signed)
*  STAT* If patient is at the pharmacy, call can be transferred to refill team.   1. Which medications need to be refilled? (please list name of each medication and dose if known) Xarelto   2. Which pharmacy/location (including street and city if local pharmacy) is medication to be sent to? [facility] [place] [facility] [place]  - fax  [phone]   3. Do they need a 30 day or 90 day supply? 90   Patient is going to [place] and [place] for her Xarelto so that she can get it at a lower cost

## 2021-03-28 NOTE — Telephone Encounter (Signed)
Refill complete 

## 2021-04-12 DIAGNOSIS — Z23 Encounter for immunization: Secondary | ICD-10-CM | POA: Diagnosis not present

## 2021-04-25 DIAGNOSIS — N6321 Unspecified lump in the left breast, upper outer quadrant: Secondary | ICD-10-CM | POA: Diagnosis not present

## 2021-04-25 DIAGNOSIS — N6323 Unspecified lump in the left breast, lower outer quadrant: Secondary | ICD-10-CM | POA: Diagnosis not present

## 2021-04-25 DIAGNOSIS — Z853 Personal history of malignant neoplasm of breast: Secondary | ICD-10-CM | POA: Diagnosis not present

## 2021-04-25 DIAGNOSIS — R928 Other abnormal and inconclusive findings on diagnostic imaging of breast: Secondary | ICD-10-CM | POA: Diagnosis not present

## 2021-05-01 ENCOUNTER — Telehealth: Payer: Self-pay | Admitting: Hematology and Oncology

## 2021-05-01 NOTE — Telephone Encounter (Signed)
Scheduled appt per 5/10 sch msg. Pt aware.  

## 2021-05-03 DIAGNOSIS — C50412 Malignant neoplasm of upper-outer quadrant of left female breast: Secondary | ICD-10-CM | POA: Diagnosis not present

## 2021-05-28 DIAGNOSIS — C50912 Malignant neoplasm of unspecified site of left female breast: Secondary | ICD-10-CM | POA: Diagnosis not present

## 2021-05-28 DIAGNOSIS — Z79899 Other long term (current) drug therapy: Secondary | ICD-10-CM | POA: Diagnosis not present

## 2021-05-28 DIAGNOSIS — E785 Hyperlipidemia, unspecified: Secondary | ICD-10-CM | POA: Diagnosis not present

## 2021-05-28 DIAGNOSIS — I10 Essential (primary) hypertension: Secondary | ICD-10-CM | POA: Diagnosis not present

## 2021-05-28 DIAGNOSIS — I480 Paroxysmal atrial fibrillation: Secondary | ICD-10-CM | POA: Diagnosis not present

## 2021-05-28 NOTE — Progress Notes (Signed)
Patient Care Team: Asencion Noble, MD as PCP - General (Internal Medicine) Herminio Commons, MD (Inactive) as PCP - Cardiology (Cardiology) Rogene Houston, MD (Gastroenterology) Excell Seltzer, MD (Inactive) as Consulting Physician (General Surgery) Nicholas Lose, MD as Consulting Physician (Hematology and Oncology) Arloa Koh, MD (Inactive) as Consulting Physician (Radiation Oncology) Sylvan Cheese, NP as Nurse Practitioner (Hematology and Oncology)  DIAGNOSIS:    ICD-10-CM   1. Malignant neoplasm of upper-outer quadrant of left female breast, unspecified estrogen receptor status ([place])  C50.412     SUMMARY OF ONCOLOGIC HISTORY: Oncology History  Breast cancer of upper-outer quadrant of left female breast ([place])  11/02/2015 Mammogram   screening mammogram category B breast density, asymmetry with indistinct margin in the left breast upper outer quadrant posterior depth, 2 internal mammary lymph nodes, 6 mm at 2:00 position axillary negative   11/21/2015 Initial Diagnosis   left breast biopsy: Invasive ductal carcinoma with associated low-grade DCIS with microcalcifications focally involving a papilloma, ER/PR 100%, HER-2 negative ratio 1.22   12/13/2015 Surgery   Left lumpectomy: Invasive ductal carcinoma 0.4 cm, DCIS 1.6 cm, clear margins, ER 100%, PR 100%, HER-2 negative ratio 1.22, Ki-67 5% T1 N0 stage IA   12/21/2015 - 03/20/2016 Anti-estrogen oral therapy   Tamoxifen 20 mg daily decreased to 10 mg daily, unable to tolerate it.   10/31/2020 Relapse/Recurrence   Recurrent disease: Multifocal  0.9 cm, 0.3 cm and an intramammary lymph node all 3 biopsy-proven grade 1 IDC ER/PR positive HER-2 equivocal FISH pending, Ki-67 2%     CHIEF COMPLIANT: Follow-up of recurrent breast cancer  INTERVAL HISTORY: Hailey Graham is a 87 y.o. with above-mentioned history of recurrent left breast cancer initiallytreated withlumpectomyand when she developed recurrence,  surgery was not an option and therefore we are treating her with anastrozole .She presents to the clinic todayfor follow-up.   She had a recent mammogram which showed decreasing size of the multiple lesions throughout the breast.  She has been tolerating anastrozole extremely well.  ALLERGIES:  is allergic to brimonidine, codeine, morphine, and sulfa drugs cross reactors.  MEDICATIONS:  Current Outpatient Medications  Medication Sig Dispense Refill  . acetaminophen (TYLENOL) 500 MG tablet Take 500 mg by mouth as needed for mild pain, moderate pain, fever or headache.    . ALPRAZolam (XANAX) 0.5 MG tablet Take 0.25 mg by mouth 3 (three) times daily as needed for anxiety. Order is 0.05 tid prn    . anastrozole (ARIMIDEX) 1 MG tablet Take 1 tablet (1 mg total) by mouth daily. 90 tablet 3  . Cholecalciferol (VITAMIN D) 2000 UNITS tablet Take 2,000 Units by mouth daily.    . ciprofloxacin (CIPRO) 250 MG tablet Take 250 mg by mouth 2 (two) times daily. (Patient not taking: Reported on 03/06/2021)    . Cyanocobalamin (B-12) 1000 MCG TABS Take by mouth daily.     Marland Kitchen docusate sodium (COLACE) 250 MG capsule Take 250 mg by mouth daily.    . dorzolamide-timolol (COSOPT) 22.3-6.8 MG/ML ophthalmic solution 1 drop 2 (two) times daily.    Marland Kitchen ezetimibe-simvastatin (VYTORIN) 10-20 MG tablet Take 1 tablet by mouth daily. 90 tablet 3  . famotidine (PEPCID) 20 MG tablet Take 1 tablet (20 mg total) by mouth at bedtime.    Marland Kitchen latanoprost (XALATAN) 0.005 % ophthalmic solution Place 1 drop into both eyes.     . metoprolol succinate (TOPROL-XL) 25 MG 24 hr tablet Take 0.5 tablets (12.5 mg total) by mouth 2 (two) times  daily. 90 tablet 3  . nystatin-triamcinolone (MYCOLOG II) cream Apply 1 application topically 2 (two) times daily. 30 g 0  . pantoprazole (PROTONIX) 40 MG tablet TAKE (1) TABLET BY MOUTH EACH MORNING. 90 tablet 4  . pilocarpine (PILOCAR) 1 % ophthalmic solution Place 1 drop into the right eye 3 times daily.     . rivaroxaban (XARELTO) 20 MG TABS tablet Take 1 tablet (20 mg total) by mouth daily with supper. 90 tablet 3   No current facility-administered medications for this visit.    PHYSICAL EXAMINATION: ECOG PERFORMANCE STATUS: 1 - Symptomatic but completely ambulatory  Vitals:   05/29/21 1153  BP: (!) 145/70  Pulse: 62  Resp: 19  Temp: 97.6 F (36.4 C)  SpO2: 100%   Filed Weights   05/29/21 1153  Weight: 175 lb 14.4 oz (79.8 kg)     LABORATORY DATA:  I have reviewed the data as listed CMP Latest Ref Rng & Units 07/23/2019 11/26/2016 12/12/2015  Glucose 65 - 99 mg/dL - 78 108(H)  BUN 8 - 27 mg/dL - 21 24(H)  Creatinine 0.5 - 1.1 0.7 0.73 0.81  Sodium 134 - 144 mmol/L - 141 142  Potassium 3.5 - 5.2 mmol/L - 4.4 4.3  Chloride 96 - 106 mmol/L - 103 109  CO2 18 - 29 mmol/L - 25 28  Calcium 8.7 - 10.7 10.1 9.7 9.9  Total Protein 6.4 - 8.3 g/dL - - -  Total Bilirubin 0.20 - 1.20 mg/dL - - -  Alkaline Phos 25 - 125 74 - -  AST 13 - 35 17 - -  ALT 7 - 35 16 - -    Lab Results  Component Value Date   WBC 5.6 07/23/2019   HGB 13.7 07/23/2019   HCT 43 07/23/2019   MCV 89 11/26/2016   PLT 215 12/12/2015   NEUTROABS 3 07/23/2019    ASSESSMENT & PLAN:  Breast cancer of upper-outer quadrant of left female breast ([place]) Left lumpectomy 12/13/2015: Invasive ductal carcinoma 0.4 cm, DCIS 1.6 cm, clear margins, ER 100%, PR 100%, HER-2 negative ratio 1.22, Ki-67 5% T1 N0 stage IA 10/31/2020: Left breast biopsy for 0.9 cm mass at 2 o'clock position: Grade 1 IDC ER greater than 95%, PR greater than 95%, HER-2 2+ by IHC FISH negative, intramammary lymph node biopsy: Positive for cancer, left breast biopsy 3 o'clock position: Grade 1 IDC with DCIS  Tamoxifen started 12/21/2015 discontinued 03/20/2016 (for severe hot flashes, night sweats)  Breast Cancer Surveillance: 1. Breast exam10/15/2020: Small palpable skin nodule the surgical scar in the left breast: Biopsy scar tissue 2.  Mammogram: And ultrasound 05/18/2021: Decrease in size of the breast lesions slight increase in the intramammary lymph node  Based on excellent response on the mammograms, I recommended continuation of anastrozole therapy. Return to clinic every 6 months with mammogram and follow-up.    No orders of the defined types were placed in this encounter.  The patient has a good understanding of the overall plan. she agrees with it. she will call with any problems that may develop before the next visit here.  Total time spent: 20 mins including face to face time and time spent for planning, charting and coordination of care  Rulon Eisenmenger, MD, MPH 05/29/2021  I, Cloyde Reams Dorshimer, am acting as scribe for Dr. Nicholas Lose.  I have reviewed the above documentation for accuracy and completeness, and I agree with the above.

## 2021-05-28 NOTE — Assessment & Plan Note (Signed)
Left lumpectomy 12/13/2015: Invasive ductal carcinoma 0.4 cm, DCIS 1.6 cm, clear margins, ER 100%, PR 100%, HER-2 negative ratio 1.22, Ki-67 5% T1 N0 stage IA  Tamoxifen started 12/21/2015 discontinued 03/20/2016 (for severe hot flashes, night sweats)  Breast Cancer Surveillance: 1. Breast exam10/15/2020: Small palpable skin nodule the surgical scar in the left breast: Biopsy scar tissue 2. Mammogram: 2 new suspicious masses and 1 abnormal intramammary lymph node  10/31/2020: Left breast biopsy for 0.9 cm mass at 2 o'clock position: Grade 1 IDC ER greater than 95%, PR greater than 95%, HER-2 2+ by IHC FISH pending, intramammary lymph node biopsy: Positive for cancer, left breast biopsy 3 o'clock position: Grade 1 IDC with DCIS  Plan: Patient does not want to undergo mastectomy.  She has an appointment to see Dr. Donne Hazel November 30, 2020.  She wants to discuss surgical options with him.  If she is not a candidate for surgery then she could be treated with palliative intent antiestrogen therapy. In fact started her on anastrozole today.  I discussed risks and benefits and she is willing to proceed. She would prefer to undergo lumpectomy and take antiestrogen therapy subsequently.  Return to clinic after surgery to discuss the pathology report if she does undergo surgery.  If she does not undergo surgery then I will see her back in 3 months to assess tolerability.  And plan to perform mammograms in 6 months.

## 2021-05-29 ENCOUNTER — Other Ambulatory Visit: Payer: Self-pay

## 2021-05-29 ENCOUNTER — Inpatient Hospital Stay: Payer: Medicare Other | Attending: Hematology and Oncology | Admitting: Hematology and Oncology

## 2021-05-29 DIAGNOSIS — C50412 Malignant neoplasm of upper-outer quadrant of left female breast: Secondary | ICD-10-CM | POA: Insufficient documentation

## 2021-05-29 DIAGNOSIS — Z79811 Long term (current) use of aromatase inhibitors: Secondary | ICD-10-CM | POA: Insufficient documentation

## 2021-05-29 DIAGNOSIS — C50812 Malignant neoplasm of overlapping sites of left female breast: Secondary | ICD-10-CM | POA: Insufficient documentation

## 2021-05-29 DIAGNOSIS — C773 Secondary and unspecified malignant neoplasm of axilla and upper limb lymph nodes: Secondary | ICD-10-CM | POA: Diagnosis not present

## 2021-05-31 DIAGNOSIS — I480 Paroxysmal atrial fibrillation: Secondary | ICD-10-CM | POA: Diagnosis not present

## 2021-05-31 DIAGNOSIS — E785 Hyperlipidemia, unspecified: Secondary | ICD-10-CM | POA: Diagnosis not present

## 2021-05-31 DIAGNOSIS — I4510 Unspecified right bundle-branch block: Secondary | ICD-10-CM | POA: Diagnosis not present

## 2021-05-31 DIAGNOSIS — C50912 Malignant neoplasm of unspecified site of left female breast: Secondary | ICD-10-CM | POA: Diagnosis not present

## 2021-06-04 ENCOUNTER — Telehealth: Payer: Self-pay | Admitting: *Deleted

## 2021-06-04 NOTE — Telephone Encounter (Signed)
Received call from pt with complaint of very mild nausea.  Pt believes nausea is associated with Anastrozole.  Per MD pt to stop taking anastrozole x2 weeks to see if symptoms resolve.  Pt states nausea is not severe enough for her to stop medication at this time.  Pt states she will continue to take it as directed and will call the office if symptoms become worse.  Pt also requesting copy of recent office note be mailed to the address on file.

## 2021-06-11 DIAGNOSIS — H401413 Capsular glaucoma with pseudoexfoliation of lens, right eye, severe stage: Secondary | ICD-10-CM | POA: Diagnosis not present

## 2021-06-11 DIAGNOSIS — H401423 Capsular glaucoma with pseudoexfoliation of lens, left eye, severe stage: Secondary | ICD-10-CM | POA: Diagnosis not present

## 2021-06-19 DIAGNOSIS — R278 Other lack of coordination: Secondary | ICD-10-CM | POA: Diagnosis not present

## 2021-06-19 DIAGNOSIS — I10 Essential (primary) hypertension: Secondary | ICD-10-CM | POA: Diagnosis not present

## 2021-06-19 DIAGNOSIS — R269 Unspecified abnormalities of gait and mobility: Secondary | ICD-10-CM | POA: Diagnosis not present

## 2021-06-19 DIAGNOSIS — E785 Hyperlipidemia, unspecified: Secondary | ICD-10-CM | POA: Diagnosis not present

## 2021-06-19 DIAGNOSIS — R519 Headache, unspecified: Secondary | ICD-10-CM | POA: Diagnosis not present

## 2021-06-20 DIAGNOSIS — M1711 Unilateral primary osteoarthritis, right knee: Secondary | ICD-10-CM | POA: Diagnosis not present

## 2021-06-20 DIAGNOSIS — M170 Bilateral primary osteoarthritis of knee: Secondary | ICD-10-CM | POA: Diagnosis not present

## 2021-08-22 DIAGNOSIS — R197 Diarrhea, unspecified: Secondary | ICD-10-CM | POA: Diagnosis not present

## 2021-08-22 DIAGNOSIS — A09 Infectious gastroenteritis and colitis, unspecified: Secondary | ICD-10-CM | POA: Diagnosis not present

## 2021-10-01 ENCOUNTER — Other Ambulatory Visit: Payer: Self-pay

## 2021-10-01 ENCOUNTER — Ambulatory Visit (HOSPITAL_COMMUNITY)
Admission: RE | Admit: 2021-10-01 | Discharge: 2021-10-01 | Disposition: A | Discharge status: Home / Self Care | Payer: Medicare Other | Source: Ambulatory Visit | Attending: Internal Medicine | Admitting: Internal Medicine

## 2021-10-01 ENCOUNTER — Other Ambulatory Visit (HOSPITAL_COMMUNITY): Payer: Self-pay | Admitting: Internal Medicine

## 2021-10-01 DIAGNOSIS — R0602 Shortness of breath: Secondary | ICD-10-CM | POA: Insufficient documentation

## 2021-10-01 DIAGNOSIS — I480 Paroxysmal atrial fibrillation: Secondary | ICD-10-CM | POA: Diagnosis not present

## 2021-10-01 DIAGNOSIS — Z23 Encounter for immunization: Secondary | ICD-10-CM | POA: Diagnosis not present

## 2021-10-08 DIAGNOSIS — H401413 Capsular glaucoma with pseudoexfoliation of lens, right eye, severe stage: Secondary | ICD-10-CM | POA: Diagnosis not present

## 2021-10-08 DIAGNOSIS — H401423 Capsular glaucoma with pseudoexfoliation of lens, left eye, severe stage: Secondary | ICD-10-CM | POA: Diagnosis not present

## 2021-11-07 ENCOUNTER — Encounter: Payer: Self-pay | Admitting: Hematology and Oncology

## 2021-11-07 ENCOUNTER — Telehealth: Payer: Self-pay | Admitting: *Deleted

## 2021-11-07 DIAGNOSIS — M8589 Other specified disorders of bone density and structure, multiple sites: Secondary | ICD-10-CM | POA: Diagnosis not present

## 2021-11-07 DIAGNOSIS — Z780 Asymptomatic menopausal state: Secondary | ICD-10-CM | POA: Diagnosis not present

## 2021-11-07 DIAGNOSIS — Z853 Personal history of malignant neoplasm of breast: Secondary | ICD-10-CM | POA: Diagnosis not present

## 2021-11-07 NOTE — Telephone Encounter (Signed)
Received call from pt stating she received results of an abnormal mammogram today from [facility] and is scheduled for a breast US for further evaluation on 11/13/21.  Pt states she may not be able to make that appointment and wanting to know if Dr. Lindi Adie would prefer to have the [place] report prior to seeing pt on 11/19/21.  Per MD pt needing [place] report prior to office visit.  RN educated pt to call our office if she reschedules her [place] and we can push out the MD visit to be 1 week post [place].  Pt verbalized understanding and appreciative of advice.

## 2021-11-13 ENCOUNTER — Encounter: Payer: Self-pay | Admitting: Hematology and Oncology

## 2021-11-13 DIAGNOSIS — C50912 Malignant neoplasm of unspecified site of left female breast: Secondary | ICD-10-CM | POA: Diagnosis not present

## 2021-11-16 ENCOUNTER — Other Ambulatory Visit: Payer: Self-pay | Admitting: Hematology and Oncology

## 2021-11-18 NOTE — Progress Notes (Signed)
Patient Care Team: Asencion Noble, MD as PCP - General (Internal Medicine) Herminio Commons, MD (Inactive) as PCP - Cardiology (Cardiology) Rogene Houston, MD (Gastroenterology) Excell Seltzer, MD (Inactive) as Consulting Physician (General Surgery) Nicholas Lose, MD as Consulting Physician (Hematology and Oncology) Arloa Koh, MD (Inactive) as Consulting Physician (Radiation Oncology) Sylvan Cheese, NP as Nurse Practitioner (Hematology and Oncology)  DIAGNOSIS:    ICD-10-CM   1. Malignant neoplasm of upper-outer quadrant of left female breast, unspecified estrogen receptor status ([place] Graham)  C50.412 MM DIAG BREAST TOMO UNI LEFT    US BREAST LTD UNI LEFT INC AXILLA      SUMMARY OF ONCOLOGIC HISTORY: Oncology History  Breast cancer of upper-outer quadrant of left female breast (Hailey Graham)  11/02/2015 Mammogram   screening mammogram category B breast density, asymmetry with indistinct margin in the left breast upper outer quadrant posterior depth, 2 internal mammary lymph nodes, 6 mm at 2:00 position axillary negative   11/21/2015 Initial Diagnosis   left breast biopsy: Invasive ductal carcinoma with associated low-grade DCIS with microcalcifications focally involving a papilloma, ER/PR 100%, HER-2 negative ratio 1.22   12/13/2015 Surgery   Left lumpectomy: Invasive ductal carcinoma 0.4 cm, DCIS 1.6 cm, clear margins, ER 100%, PR 100%, HER-2 negative ratio 1.22, Ki-67 5% T1 N0 stage IA   12/21/2015 - 03/20/2016 Anti-estrogen oral therapy   Tamoxifen 20 mg daily decreased to 10 mg daily, unable to tolerate it.   10/31/2020 Relapse/Recurrence   Recurrent disease: Multifocal  0.9 cm, 0.3 cm and an intramammary lymph node all 3 biopsy-proven grade 1 IDC ER/PR positive HER-2 equivocal FISH pending, Ki-67 2%     CHIEF COMPLIANT: Follow-up of recurrent breast cancer  INTERVAL HISTORY: Hailey Graham is a 87 y.o. with above-mentioned history of recurrent left breast cancer  initially treated with lumpectomy and when she developed recurrence, surgery was not an option and therefore we are treating her with anastrozole. She presents to the clinic today for follow-up.   ALLERGIES:  is allergic to brimonidine, codeine, morphine, and sulfa drugs cross reactors.  MEDICATIONS:  Current Outpatient Medications  Medication Sig Dispense Refill   acetaminophen (TYLENOL) 500 MG tablet Take 500 mg by mouth as needed for mild pain, moderate pain, fever or headache.     ALPRAZolam (XANAX) 0.5 MG tablet Take 0.25 mg by mouth 3 (three) times daily as needed for anxiety. Order is 0.05 tid prn     anastrozole (ARIMIDEX) 1 MG tablet Take 1 tablet (1 mg total) by mouth daily. 90 tablet 3   Cholecalciferol (VITAMIN D) 2000 UNITS tablet Take 2,000 Units by mouth daily.     ciprofloxacin (CIPRO) 250 MG tablet Take 250 mg by mouth 2 (two) times daily. (Patient not taking: Reported on 03/06/2021)     Cyanocobalamin (B-12) 1000 MCG TABS Take by mouth daily.      docusate sodium (COLACE) 250 MG capsule Take 250 mg by mouth daily.     dorzolamide-timolol (COSOPT) 22.3-6.8 MG/ML ophthalmic solution 1 drop 2 (two) times daily.     ezetimibe-simvastatin (VYTORIN) 10-20 MG tablet Take 1 tablet by mouth daily. 90 tablet 3   famotidine (PEPCID) 20 MG tablet Take 1 tablet (20 mg total) by mouth at bedtime.     latanoprost (XALATAN) 0.005 % ophthalmic solution Place 1 drop into both eyes.      metoprolol succinate (TOPROL-XL) 25 MG 24 hr tablet Take 0.5 tablets (12.5 mg total) by mouth 2 (two) times daily. 90 tablet 3  nystatin-triamcinolone (MYCOLOG II) cream Apply 1 application topically 2 (two) times daily. 30 g 0   pantoprazole (PROTONIX) 40 MG tablet TAKE (1) TABLET BY MOUTH EACH MORNING. 90 tablet 4   rivaroxaban (XARELTO) 20 MG TABS tablet Take 1 tablet (20 mg total) by mouth daily with supper. 90 tablet 3   No current facility-administered medications for this visit.    PHYSICAL  EXAMINATION: ECOG PERFORMANCE STATUS: 1 - Symptomatic but completely ambulatory  Vitals:   11/19/21 1108  BP: 136/87  Pulse: 60  Resp: 18  Temp: 97.8 F (36.6 C)  SpO2: 98%   Filed Weights   11/19/21 1108  Weight: 173 lb 4.8 oz (78.6 kg)    BREAST: No palpable masses or nodules in either right or left breasts. No palpable axillary supraclavicular or infraclavicular adenopathy no breast tenderness or nipple discharge. (exam performed in the presence of a chaperone)  LABORATORY DATA:  I have reviewed the data as listed CMP Latest Ref Rng & Units 07/23/2019 11/26/2016 12/12/2015  Glucose 65 - 99 mg/dL - 78 108(H)  BUN 8 - 27 mg/dL - 21 24(H)  Creatinine 0.5 - 1.1 0.7 0.73 0.81  Sodium 134 - 144 mmol/L - 141 142  Potassium 3.5 - 5.2 mmol/L - 4.4 4.3  Chloride 96 - 106 mmol/L - 103 109  CO2 18 - 29 mmol/L - 25 28  Calcium 8.7 - 10.7 10.1 9.7 9.9  Total Protein 6.4 - 8.3 g/dL - - -  Total Bilirubin 0.20 - 1.20 mg/dL - - -  Alkaline Phos 25 - 125 74 - -  AST 13 - 35 17 - -  ALT 7 - 35 16 - -    Lab Results  Component Value Date   WBC 5.6 07/23/2019   HGB 13.7 07/23/2019   HCT 43 07/23/2019   MCV 89 11/26/2016   PLT 215 12/12/2015   NEUTROABS 3 07/23/2019    ASSESSMENT & PLAN:  Breast cancer of upper-outer quadrant of left female breast ([place]) Left lumpectomy 12/13/2015: Invasive ductal carcinoma 0.4 cm, DCIS 1.6 cm, clear margins, ER 100%, PR 100%, HER-2 negative ratio 1.22, Ki-67 5% T1 N0 stage IA 10/31/2020: Left breast biopsy for 0.9 cm mass at 2 o'clock position: Grade 1 IDC ER greater than 95%, PR greater than 95%, HER-2 2+ by IHC FISH negative, intramammary lymph node biopsy: Positive for cancer, left breast biopsy 3 o'clock position: Grade 1 IDC with DCIS   Tamoxifen started 12/21/2015 discontinued 03/20/2016 (for severe hot flashes, night sweats) Current treatment: Anastrozole Anastrozole toxicities: Tolerating it extremely well.   Breast Cancer Surveillance: 1.  Breast exam  10/07/2019: Small palpable skin nodule the surgical scar in the left breast: Biopsy scar tissue 2. Mammogram: And ultrasound [name]: Decrease in size of the breast lesions 73-monthmammogram follow-up will be obtained.   I sent a message to Dr. WDonne Hazelto see if he wants to see her in 6 months so that I can see her once a year.    Orders Placed This Encounter  Procedures   MM DIAG BREAST TOMO UNI LEFT    Standing Status:   Future    Standing Expiration Date:   11/19/2022    Order Specific Question:   Reason for Exam (SYMPTOM  OR DIAGNOSIS REQUIRED)    Answer:   6 month follow up    Order Specific Question:   Preferred imaging location?    Answer:   External    Comments:   [name]  Order Specific Question:   Release to patient    Answer:   Immediate   US BREAST LTD UNI LEFT INC AXILLA    Standing Status:   Future    Standing Expiration Date:   11/19/2022    Order Specific Question:   Reason for Exam (SYMPTOM  OR DIAGNOSIS REQUIRED)    Answer:   6 month follow up of breast cancer    Comments:   [name]    Order Specific Question:   Preferred imaging location?    Answer:   External    Order Specific Question:   Release to patient    Answer:   Immediate    The patient has a good understanding of the overall plan. she agrees with it. she will call with any problems that may develop before the next visit here.  Total time spent: 20 mins including face to face time and time spent for planning, charting and coordination of care  Rulon Eisenmenger, MD, MPH 11/19/2021  I, Thana Ates, am acting as scribe for Dr. Nicholas Lose.  I have reviewed the above documentation for accuracy and completeness, and I agree with the above.

## 2021-11-19 ENCOUNTER — Inpatient Hospital Stay: Payer: Medicare Other | Attending: Hematology and Oncology | Admitting: Hematology and Oncology

## 2021-11-19 ENCOUNTER — Other Ambulatory Visit: Payer: Self-pay

## 2021-11-19 DIAGNOSIS — C50412 Malignant neoplasm of upper-outer quadrant of left female breast: Secondary | ICD-10-CM | POA: Diagnosis not present

## 2021-11-19 DIAGNOSIS — Z170 Estrogen receptor positive status [ER+]: Secondary | ICD-10-CM | POA: Diagnosis not present

## 2021-11-19 DIAGNOSIS — Z79811 Long term (current) use of aromatase inhibitors: Secondary | ICD-10-CM | POA: Diagnosis not present

## 2021-11-19 MED ORDER — ANASTROZOLE 1 MG PO TABS: 1.0000 mg | ORAL_TABLET | Freq: Every day | ORAL | 3 refills | Status: DC

## 2021-11-19 NOTE — Telephone Encounter (Signed)
Reordered on 11/28 by Dr. Lindi Adie. Gardiner Rhyme, RN

## 2021-11-19 NOTE — Assessment & Plan Note (Signed)
Left lumpectomy 12/13/2015: Invasive ductal carcinoma 0.4 cm, DCIS 1.6 cm, clear margins, ER 100%, PR 100%, HER-2 negative ratio 1.22, Ki-67 5% T1 N0 stage IA 10/31/2020: Left breast biopsy for 0.9 cm mass at 2 o'clock position: Grade 1 IDC ER greater than 95%, PR greater than 95%, HER-2 2+ by IHC FISH negative, intramammary lymph node biopsy: Positive for cancer, left breast biopsy 3 o'clock position: Grade 1 IDC with DCIS  Tamoxifen started 12/21/2015 discontinued 03/20/2016 (for severe hot flashes, night sweats)  Breast Cancer Surveillance: 1. Breast exam10/15/2020: Small palpable skin nodule the surgical scar in the left breast: Biopsy scar tissue 2.Mammogram: And ultrasound 05/18/2021: Decrease in size of the breast lesions slight increase in the intramammary lymph node  Based on excellent response on the mammograms, I recommended continuation of anastrozole therapy. Return to clinic every 6 months with mammogram and follow-up.

## 2021-11-20 ENCOUNTER — Encounter: Payer: Self-pay | Admitting: *Deleted

## 2021-11-20 NOTE — Progress Notes (Signed)
Pt requested [name] reports and recent office visit note be mailed to address on file.  RN verified address and mailed request documents.

## 2021-11-21 DIAGNOSIS — E785 Hyperlipidemia, unspecified: Secondary | ICD-10-CM | POA: Diagnosis not present

## 2021-11-21 DIAGNOSIS — I10 Essential (primary) hypertension: Secondary | ICD-10-CM | POA: Diagnosis not present

## 2021-11-26 NOTE — Progress Notes (Signed)
Cardiology Office Note:    Date:  12/05/2021   ID:  Hailey Graham, DOB 1928/01/11, MRN [id number]  PCP:  Asencion Noble, MD   [facility] Providers Cardiologist:  Kate Sable, MD (Inactive) {    Referring MD: Asencion Noble, MD    History of Present Illness:    Hailey Graham is a 87 y.o. female with a hx of paroxysmal atrial fibrillation, HLD, varicose veins, and history of breast cancer (s/p lumpectomy in 2016) who presents to clinic for follow-up. Was previously followed by Dr. Bronson Ing.  Patient was last seen by Bernerd Pho on 02/2021 with SOB with exertion that were infrequent. Recommended to monitor her HR as everything else was stable.  Today, the patient is feeling overall well. She feels much better taking metoprolol 12.5mg  BID rather than 25mg  daily. No chest pain. Continues to have mild dyspnea with exertion, but this has been a chronic issue and resolves with rest. No orthopnea or PND. Has been having sciatica and is interested in seeing a sports medicine physician. No bleeding issues on the apixaban.   Past Medical History:  Diagnosis Date   Allergy    Anxiety    Arthritis    Breast cancer of upper-outer quadrant of left female breast ([place]) 11/24/2015   Breast cancer of upper-outer quadrant of left female breast ([place]) 11/24/2015   Cataract    Complication of anesthesia    Depression    Diverticulosis    Gait abnormality    Gastroesophageal reflux disease    Glaucoma    Hyperlipidemia    Lipid profile in 05/2010:149, 86, 48, 84   Lung nodule    Right lower lobe; stable in 2010   Paroxysmal atrial fibrillation (HCC)    Rate related right bundle branch block; normal EF on echo in 2006; normal stress nuclear-2006; mild RV hypokinesis on MRI;  RA and RV enlargement on CT in 9/06; Anticoagulation->discontinued in 2009   Personal history of kidney stones    PONV (postoperative nausea and vomiting)    Nausea    Past Surgical History:  Procedure Laterality  Date   ABDOMINAL HYSTERECTOMY  2202   ANKLE SURGERY Right 2003    Fracture- rod   APPENDECTOMY     as a teenager   BREAST LUMPECTOMY WITH RADIOACTIVE SEED LOCALIZATION Left 12/13/2015   Procedure: BREAST LUMPECTOMY WITH RADIOACTIVE SEED LOCALIZATION;  Surgeon: Excell Seltzer, MD;  Location: [place];  Service: General;  Laterality: Left;   CATARACT EXTRACTION W/ INTRAOCULAR LENS IMPLANT Right 2014   COLONOSCOPY W/ POLYPECTOMY     ESOPHAGOGASTRODUODENOSCOPY     GLAUCOMA SURGERY Right 2014   HERNIA REPAIR Right    1990's   ORIF ANKLE FRACTURE  2003   Right   TONSILLECTOMY     age 4   Varicose veins Right 2207    Current Medications: Current Meds  Medication Sig   acetaminophen (TYLENOL) 500 MG tablet Take 500 mg by mouth as needed for mild pain, moderate pain, fever or headache.   ALPRAZolam (XANAX) 0.5 MG tablet Take 0.25 mg by mouth 3 (three) times daily as needed for anxiety. Order is 0.05 tid prn   anastrozole (ARIMIDEX) 1 MG tablet Take 1 tablet (1 mg total) by mouth daily.   Cholecalciferol (VITAMIN D) 2000 UNITS tablet Take 2,000 Units by mouth daily.   ciprofloxacin (CIPRO) 250 MG tablet Take 250 mg by mouth 2 (two) times daily.   Cyanocobalamin (B-12) 1000 MCG TABS Take by mouth daily.  docusate sodium (COLACE) 250 MG capsule Take 250 mg by mouth daily.   dorzolamide-timolol (COSOPT) 22.3-6.8 MG/ML ophthalmic solution 1 drop 2 (two) times daily.   ezetimibe-simvastatin (VYTORIN) 10-20 MG tablet Take 1 tablet by mouth daily.   famotidine (PEPCID) 20 MG tablet Take 1 tablet (20 mg total) by mouth at bedtime.   latanoprost (XALATAN) 0.005 % ophthalmic solution Place 1 drop into both eyes.    metoprolol succinate (TOPROL-XL) 25 MG 24 hr tablet Take 0.5 tablets (12.5 mg total) by mouth 2 (two) times daily.   nystatin-triamcinolone (MYCOLOG II) cream Apply 1 application topically 2 (two) times daily.   pantoprazole (PROTONIX) 40 MG tablet TAKE (1) TABLET BY MOUTH EACH MORNING.    rivaroxaban (XARELTO) 20 MG TABS tablet Take 1 tablet (20 mg total) by mouth daily with supper.     Allergies:   Brimonidine, Codeine, Morphine, and Sulfa drugs cross reactors   Social History   Socioeconomic History   Marital status: Widowed    Spouse name: Not on file   Number of children: 0   Years of education: 15   Highest education level: Not on file  Occupational History   Occupation: Retired  Tobacco Use   Smoking status: Never   Smokeless tobacco: Never  Vaping Use   Vaping Use: Never used  Substance and Sexual Activity   Alcohol use: No    Alcohol/week: 0.0 standard drinks   Drug use: No   Sexual activity: Not Currently  Other Topics Concern   Not on file  Social History Narrative   Lives at home with husband.   Right-handed.   No caffeine use.   Social Determinants of Health   Financial Resource Strain: Not on file  Food Insecurity: Not on file  Transportation Needs: Not on file  Physical Activity: Not on file  Stress: Not on file  Social Connections: Not on file     Family History: The patient's family history includes Cerebral aneurysm in her father; Healthy in her mother; Lung cancer in her sister.  ROS:   Please see the history of present illness.    Review of Systems  Constitutional:  Negative for malaise/fatigue.  Eyes:  Negative for blurred vision.  Respiratory:  Positive for shortness of breath.   Cardiovascular:  Negative for chest pain, palpitations, orthopnea, claudication, leg swelling and PND.  Gastrointestinal:  Negative for blood in stool, melena, nausea and vomiting.  Genitourinary:  Negative for hematuria.  Musculoskeletal:  Positive for joint pain and myalgias.  Neurological:  Negative for dizziness and loss of consciousness.    EKGs/Labs/Other Studies Reviewed:    The following studies were reviewed today: Echocardiogram: 09/2019 IMPRESSIONS    1. Left ventricular ejection fraction, by visual estimation, is 60 to  65%.  The left ventricle has normal function. There is moderately increased  left ventricular hypertrophy.   2. Left ventricular diastolic parameters are consistent with Grade I  diastolic dysfunction (impaired relaxation).   3. Global right ventricle has normal systolic function.The right  ventricular size is normal. No increase in right ventricular wall  thickness.   4. Left atrial size was normal.   5. Right atrial size was normal.   6. The mitral valve is grossly normal. No evidence of mitral valve  regurgitation.   7. The tricuspid valve is grossly normal. Tricuspid valve regurgitation  is trivial.   8. The aortic valve is tricuspid. Aortic valve regurgitation is mild.  Mild aortic valve sclerosis without stenosis.   9.  The pulmonic valve was grossly normal. Pulmonic valve regurgitation is  not visualized.  10. The inferior vena cava is normal in size with greater than 50%  respiratory variability, suggesting right atrial pressure of 3 mmHg.   EKG: No new ECG.  Recent Labs: No results found for requested labs within last 8760 hours.  Recent Lipid Panel    Component Value Date/Time   CHOL 137 07/23/2019 0000   TRIG 97 07/23/2019 0000   HDL 45 07/23/2019 0000   CHOLHDL 2.9 11/12/2017 0845   VLDL 13 11/12/2017 0845   LDLCALC 73 07/23/2019 0000     Risk Assessment/Calculations:    CHA2DS2-VASc Score = 4  {This indicates a 4.8% annual risk of stroke. The patient's score is based upon: CHF History: 0 HTN History: 1 Diabetes History: 0 Stroke History: 0 Vascular Disease History: 0 Age Score: 2 Gender Score: 1         Physical Exam:    VS:  BP 116/78   Pulse 72   Wt 171 lb (77.6 kg)   SpO2 97%   BMI 27.60 kg/m     Wt Readings from Last 3 Encounters:  12/05/21 171 lb (77.6 kg)  11/19/21 173 lb 4.8 oz (78.6 kg)  05/29/21 175 lb 14.4 oz (79.8 kg)     GEN:  Well nourished, well developed in no acute distress HEENT: Normal NECK: No JVD; No carotid  bruits CARDIAC: RRR, soft systolic murmur, rubs, gallops RESPIRATORY:  Clear to auscultation without rales, wheezing or rhonchi  ABDOMEN: Soft, non-tender, non-distended MUSCULOSKELETAL:  No edema; No deformity  SKIN: Warm and dry NEUROLOGIC:  Alert and oriented x 3 PSYCHIATRIC:  Normal affect   ASSESSMENT:    1. Dyspnea on exertion   2. Paroxysmal atrial fibrillation (HCC)   3. Hyperlipidemia, unspecified hyperlipidemia type   4. Sciatica of right side    PLAN:    In order of problems listed above:  #Paroxysmal Afib: Doing well with no significant palpitations. Tolerating xarelto without bleeding issues. -Continue metop 12.5mg  XL BID -Continue xareltor 20mg  daily  #HLD: LDL 80. No known CAD. -Continue zetia-simva 10-20mg  daily  #DOE: Chronic. Symptoms come on with exertion and resolve with rest. Have been overall stable with no associated chest pain, lightheadedness, dizziness or syncope. Given that she is still able to do all ADLs and her symptoms do not bother her too much, will continue with conservative management. If symptoms increase in frequency/severity or become more bothersome, can pursue stress testing at that time. -Continue watchful waiting and if increase in severity/frequency, will do myoview at that time  #Sciatica: -Referred to Dr. Tamala Julian            Medication Adjustments/Labs and Tests Ordered: Current medicines are reviewed at length with the patient today.  Concerns regarding medicines are outlined above.  No orders of the defined types were placed in this encounter.  No orders of the defined types were placed in this encounter.   Patient Instructions  Medication Instructions:  Your physician recommends that you continue on your current medications as directed. Please refer to the Current Medication list given to you today.   Labwork: None   Testing/Procedures: None   Follow-Up: 6 months  Any Other Special Instructions Will Be Listed  Below (If Applicable).  If you need a refill on your cardiac medications before your next appointment, please call your pharmacy.   Signed, Freada Bergeron, MD  12/05/2021 2:11 PM    [facility]  

## 2021-12-05 ENCOUNTER — Encounter: Payer: Self-pay | Admitting: Cardiology

## 2021-12-05 ENCOUNTER — Other Ambulatory Visit: Payer: Self-pay

## 2021-12-05 ENCOUNTER — Ambulatory Visit: Payer: Medicare Other | Admitting: Cardiology

## 2021-12-05 VITALS — BP 116/78 | HR 72 | Wt 171.0 lb

## 2021-12-05 DIAGNOSIS — R0609 Other forms of dyspnea: Secondary | ICD-10-CM

## 2021-12-05 DIAGNOSIS — I480 Paroxysmal atrial fibrillation: Secondary | ICD-10-CM | POA: Diagnosis not present

## 2021-12-05 DIAGNOSIS — E785 Hyperlipidemia, unspecified: Secondary | ICD-10-CM | POA: Diagnosis not present

## 2021-12-05 DIAGNOSIS — M5431 Sciatica, right side: Secondary | ICD-10-CM | POA: Diagnosis not present

## 2021-12-05 NOTE — Patient Instructions (Signed)
Medication Instructions:  Your physician recommends that you continue on your current medications as directed. Please refer to the Current Medication list given to you today.   Labwork: None  Testing/Procedures: None  Follow-Up: 6 months  Any Other Special Instructions Will Be Listed Below (If Applicable).     If you need a refill on your cardiac medications before your next appointment, please call your pharmacy.   

## 2021-12-08 ENCOUNTER — Other Ambulatory Visit: Payer: Self-pay | Admitting: Student

## 2021-12-21 DIAGNOSIS — E785 Hyperlipidemia, unspecified: Secondary | ICD-10-CM | POA: Diagnosis not present

## 2021-12-21 DIAGNOSIS — I10 Essential (primary) hypertension: Secondary | ICD-10-CM | POA: Diagnosis not present

## 2022-01-02 DIAGNOSIS — B078 Other viral warts: Secondary | ICD-10-CM | POA: Diagnosis not present

## 2022-01-15 ENCOUNTER — Telehealth: Payer: Self-pay | Admitting: Student

## 2022-01-15 NOTE — Telephone Encounter (Signed)
Patient would like to speak, she want's to know if she could upgrade her Tylenol since she is on blood thinner.

## 2022-01-15 NOTE — Telephone Encounter (Signed)
I told Hailey Graham that she can take tylenol arthritis strength as needed for her hip arthritis.She states she may take medication once a week. She will follow label instructions.

## 2022-02-01 DIAGNOSIS — I480 Paroxysmal atrial fibrillation: Secondary | ICD-10-CM | POA: Diagnosis not present

## 2022-02-01 DIAGNOSIS — I10 Essential (primary) hypertension: Secondary | ICD-10-CM | POA: Diagnosis not present

## 2022-02-01 DIAGNOSIS — M79604 Pain in right leg: Secondary | ICD-10-CM | POA: Diagnosis not present

## 2022-02-04 DIAGNOSIS — M9903 Segmental and somatic dysfunction of lumbar region: Secondary | ICD-10-CM | POA: Diagnosis not present

## 2022-02-04 DIAGNOSIS — M9905 Segmental and somatic dysfunction of pelvic region: Secondary | ICD-10-CM | POA: Diagnosis not present

## 2022-02-04 DIAGNOSIS — M5441 Lumbago with sciatica, right side: Secondary | ICD-10-CM | POA: Diagnosis not present

## 2022-02-04 DIAGNOSIS — M9902 Segmental and somatic dysfunction of thoracic region: Secondary | ICD-10-CM | POA: Diagnosis not present

## 2022-02-06 DIAGNOSIS — M9905 Segmental and somatic dysfunction of pelvic region: Secondary | ICD-10-CM | POA: Diagnosis not present

## 2022-02-06 DIAGNOSIS — M9903 Segmental and somatic dysfunction of lumbar region: Secondary | ICD-10-CM | POA: Diagnosis not present

## 2022-02-06 DIAGNOSIS — M9902 Segmental and somatic dysfunction of thoracic region: Secondary | ICD-10-CM | POA: Diagnosis not present

## 2022-02-06 DIAGNOSIS — M5441 Lumbago with sciatica, right side: Secondary | ICD-10-CM | POA: Diagnosis not present

## 2022-02-08 DIAGNOSIS — M5441 Lumbago with sciatica, right side: Secondary | ICD-10-CM | POA: Diagnosis not present

## 2022-02-08 DIAGNOSIS — M9903 Segmental and somatic dysfunction of lumbar region: Secondary | ICD-10-CM | POA: Diagnosis not present

## 2022-02-08 DIAGNOSIS — M9902 Segmental and somatic dysfunction of thoracic region: Secondary | ICD-10-CM | POA: Diagnosis not present

## 2022-02-08 DIAGNOSIS — M9905 Segmental and somatic dysfunction of pelvic region: Secondary | ICD-10-CM | POA: Diagnosis not present

## 2022-02-12 DIAGNOSIS — M9905 Segmental and somatic dysfunction of pelvic region: Secondary | ICD-10-CM | POA: Diagnosis not present

## 2022-02-12 DIAGNOSIS — M9903 Segmental and somatic dysfunction of lumbar region: Secondary | ICD-10-CM | POA: Diagnosis not present

## 2022-02-12 DIAGNOSIS — M9902 Segmental and somatic dysfunction of thoracic region: Secondary | ICD-10-CM | POA: Diagnosis not present

## 2022-02-12 DIAGNOSIS — M5441 Lumbago with sciatica, right side: Secondary | ICD-10-CM | POA: Diagnosis not present

## 2022-02-15 DIAGNOSIS — M5441 Lumbago with sciatica, right side: Secondary | ICD-10-CM | POA: Diagnosis not present

## 2022-02-15 DIAGNOSIS — M9903 Segmental and somatic dysfunction of lumbar region: Secondary | ICD-10-CM | POA: Diagnosis not present

## 2022-02-15 DIAGNOSIS — M9902 Segmental and somatic dysfunction of thoracic region: Secondary | ICD-10-CM | POA: Diagnosis not present

## 2022-02-15 DIAGNOSIS — M9905 Segmental and somatic dysfunction of pelvic region: Secondary | ICD-10-CM | POA: Diagnosis not present

## 2022-02-18 DIAGNOSIS — M9905 Segmental and somatic dysfunction of pelvic region: Secondary | ICD-10-CM | POA: Diagnosis not present

## 2022-02-18 DIAGNOSIS — M5441 Lumbago with sciatica, right side: Secondary | ICD-10-CM | POA: Diagnosis not present

## 2022-02-18 DIAGNOSIS — M9903 Segmental and somatic dysfunction of lumbar region: Secondary | ICD-10-CM | POA: Diagnosis not present

## 2022-02-18 DIAGNOSIS — M9902 Segmental and somatic dysfunction of thoracic region: Secondary | ICD-10-CM | POA: Diagnosis not present

## 2022-02-22 DIAGNOSIS — M5441 Lumbago with sciatica, right side: Secondary | ICD-10-CM | POA: Diagnosis not present

## 2022-02-22 DIAGNOSIS — M9902 Segmental and somatic dysfunction of thoracic region: Secondary | ICD-10-CM | POA: Diagnosis not present

## 2022-02-22 DIAGNOSIS — M9905 Segmental and somatic dysfunction of pelvic region: Secondary | ICD-10-CM | POA: Diagnosis not present

## 2022-02-22 DIAGNOSIS — M9903 Segmental and somatic dysfunction of lumbar region: Secondary | ICD-10-CM | POA: Diagnosis not present

## 2022-02-25 DIAGNOSIS — H401423 Capsular glaucoma with pseudoexfoliation of lens, left eye, severe stage: Secondary | ICD-10-CM | POA: Diagnosis not present

## 2022-02-25 DIAGNOSIS — M9903 Segmental and somatic dysfunction of lumbar region: Secondary | ICD-10-CM | POA: Diagnosis not present

## 2022-02-25 DIAGNOSIS — H401413 Capsular glaucoma with pseudoexfoliation of lens, right eye, severe stage: Secondary | ICD-10-CM | POA: Diagnosis not present

## 2022-02-25 DIAGNOSIS — M5441 Lumbago with sciatica, right side: Secondary | ICD-10-CM | POA: Diagnosis not present

## 2022-02-25 DIAGNOSIS — M9905 Segmental and somatic dysfunction of pelvic region: Secondary | ICD-10-CM | POA: Diagnosis not present

## 2022-02-25 DIAGNOSIS — M9902 Segmental and somatic dysfunction of thoracic region: Secondary | ICD-10-CM | POA: Diagnosis not present

## 2022-02-27 ENCOUNTER — Other Ambulatory Visit: Payer: Self-pay | Admitting: Student

## 2022-03-04 DIAGNOSIS — M9902 Segmental and somatic dysfunction of thoracic region: Secondary | ICD-10-CM | POA: Diagnosis not present

## 2022-03-04 DIAGNOSIS — M9905 Segmental and somatic dysfunction of pelvic region: Secondary | ICD-10-CM | POA: Diagnosis not present

## 2022-03-04 DIAGNOSIS — M9903 Segmental and somatic dysfunction of lumbar region: Secondary | ICD-10-CM | POA: Diagnosis not present

## 2022-03-04 DIAGNOSIS — M5441 Lumbago with sciatica, right side: Secondary | ICD-10-CM | POA: Diagnosis not present

## 2022-03-08 DIAGNOSIS — M9903 Segmental and somatic dysfunction of lumbar region: Secondary | ICD-10-CM | POA: Diagnosis not present

## 2022-03-08 DIAGNOSIS — M5441 Lumbago with sciatica, right side: Secondary | ICD-10-CM | POA: Diagnosis not present

## 2022-03-08 DIAGNOSIS — M9902 Segmental and somatic dysfunction of thoracic region: Secondary | ICD-10-CM | POA: Diagnosis not present

## 2022-03-08 DIAGNOSIS — M9905 Segmental and somatic dysfunction of pelvic region: Secondary | ICD-10-CM | POA: Diagnosis not present

## 2022-03-11 DIAGNOSIS — M5441 Lumbago with sciatica, right side: Secondary | ICD-10-CM | POA: Diagnosis not present

## 2022-03-11 DIAGNOSIS — M9902 Segmental and somatic dysfunction of thoracic region: Secondary | ICD-10-CM | POA: Diagnosis not present

## 2022-03-11 DIAGNOSIS — M9905 Segmental and somatic dysfunction of pelvic region: Secondary | ICD-10-CM | POA: Diagnosis not present

## 2022-03-11 DIAGNOSIS — M9903 Segmental and somatic dysfunction of lumbar region: Secondary | ICD-10-CM | POA: Diagnosis not present

## 2022-03-20 DIAGNOSIS — M9905 Segmental and somatic dysfunction of pelvic region: Secondary | ICD-10-CM | POA: Diagnosis not present

## 2022-03-20 DIAGNOSIS — M9903 Segmental and somatic dysfunction of lumbar region: Secondary | ICD-10-CM | POA: Diagnosis not present

## 2022-03-20 DIAGNOSIS — M9902 Segmental and somatic dysfunction of thoracic region: Secondary | ICD-10-CM | POA: Diagnosis not present

## 2022-03-20 DIAGNOSIS — M5441 Lumbago with sciatica, right side: Secondary | ICD-10-CM | POA: Diagnosis not present

## 2022-03-22 DIAGNOSIS — M5441 Lumbago with sciatica, right side: Secondary | ICD-10-CM | POA: Diagnosis not present

## 2022-03-22 DIAGNOSIS — M9902 Segmental and somatic dysfunction of thoracic region: Secondary | ICD-10-CM | POA: Diagnosis not present

## 2022-03-22 DIAGNOSIS — M9905 Segmental and somatic dysfunction of pelvic region: Secondary | ICD-10-CM | POA: Diagnosis not present

## 2022-03-22 DIAGNOSIS — M9903 Segmental and somatic dysfunction of lumbar region: Secondary | ICD-10-CM | POA: Diagnosis not present

## 2022-03-25 DIAGNOSIS — M9903 Segmental and somatic dysfunction of lumbar region: Secondary | ICD-10-CM | POA: Diagnosis not present

## 2022-03-25 DIAGNOSIS — M5441 Lumbago with sciatica, right side: Secondary | ICD-10-CM | POA: Diagnosis not present

## 2022-03-25 DIAGNOSIS — M9902 Segmental and somatic dysfunction of thoracic region: Secondary | ICD-10-CM | POA: Diagnosis not present

## 2022-03-25 DIAGNOSIS — M9905 Segmental and somatic dysfunction of pelvic region: Secondary | ICD-10-CM | POA: Diagnosis not present

## 2022-03-26 DIAGNOSIS — M1711 Unilateral primary osteoarthritis, right knee: Secondary | ICD-10-CM | POA: Diagnosis not present

## 2022-03-26 DIAGNOSIS — M545 Low back pain, unspecified: Secondary | ICD-10-CM | POA: Diagnosis not present

## 2022-03-26 DIAGNOSIS — M48061 Spinal stenosis, lumbar region without neurogenic claudication: Secondary | ICD-10-CM | POA: Diagnosis not present

## 2022-03-27 ENCOUNTER — Telehealth: Payer: Self-pay | Admitting: Cardiology

## 2022-03-27 NOTE — Telephone Encounter (Signed)
? ?  Pre-operative Risk Assessment  ?  ?Patient Name: Hailey Graham  ?DOB: May 15, 1928 ?MRN: [id number]  ? ?  ? ?Request for Surgical Clearance   ? ?Procedure:   Lumbar ESI Injection  ? ?Date of Surgery:  Clearance 04/18/22                              ?   ?Surgeon:  Does not indicate ?Surgeon's Group or Practice Name:  [facility] ?Phone number:  [phone] ext. [phone] ?Fax number:  [phone] ?  ?Type of Clearance Requested:  Pharmacy  ?  ?Type of Anesthesia:  Not Indicated ?  ?Additional requests/questions:   ? ?Signed, ?Malanie C Hildebrandt   ?03/27/2022, 2:32 PM  ? ?

## 2022-03-28 NOTE — Telephone Encounter (Signed)
Patient with diagnosis of PAF on Xarelto for anticoagulation.   ? ?Procedure: Lumbar ESI Injection  ?Date of procedure: 04/18/22 ? ? ?CHA2DS2-VASc Score = 3  ?This indicates a 3.2% annual risk of stroke. ?The patient's score is based upon: ?CHF History: 0 ?HTN History: 0 ?Diabetes History: 0 ?Stroke History: 0 ?Vascular Disease History: 0 ?Age Score: 2 ?Gender Score: 1 ? ? ?CrCl 58 mL/min ?Platelet count 235K ? ?Per office protocol, patient can hold Xarelto for 3 days prior to procedure.   ?

## 2022-03-28 NOTE — Telephone Encounter (Signed)
? ?  Patient Name: Hailey Graham  ?DOB: 02-11-1928 ?MRN: [id number] ? ?Primary Cardiologist: Freada Bergeron, MD ? ?Chart reviewed as part of pre-operative protocol coverage.  ? ?Clinical pharmacists have reviewed past medical history, labs, and medications as part of preoperative protocol coverage. ? ?The following recommendations have been made: ? ?Patient with diagnosis of PAF on Xarelto for anticoagulation.   ?  ?Procedure: Lumbar ESI Injection  ?Date of procedure: 04/18/22 ?  ?  ?CHA2DS2-VASc Score = 3  ?This indicates a 3.2% annual risk of stroke. ?The patient's score is based upon: ?CHF History: 0 ?HTN History: 0 ?Diabetes History: 0 ?Stroke History: 0 ?Vascular Disease History: 0 ?Age Score: 2 ?Gender Score: 1 ?  ?  ?CrCl 58 mL/min ?Platelet count 235K ?  ?Per office protocol, patient can hold Xarelto for 3 days prior to procedure.  ? ?I will route this recommendation to the requesting party via Epic fax function and remove from preop pool. ? ?Please call with questions. ? ?Lenna Sciara, NP ?03/28/2022, 11:50 AM ? ? ?

## 2022-04-05 DIAGNOSIS — M5416 Radiculopathy, lumbar region: Secondary | ICD-10-CM | POA: Diagnosis not present

## 2022-04-25 ENCOUNTER — Telehealth: Payer: Self-pay | Admitting: Cardiology

## 2022-04-25 DIAGNOSIS — Z7901 Long term (current) use of anticoagulants: Secondary | ICD-10-CM

## 2022-04-25 MED ORDER — RIVAROXABAN 20 MG PO TABS: 20.0000 mg | ORAL_TABLET | Freq: Every day | ORAL | 1 refills | Status: DC

## 2022-04-25 NOTE — Telephone Encounter (Signed)
Prescription refill request for Xarelto received.  ?Indication: Afib  ?Last office visit:12/05/21 Hailey Graham) ?Weight: 77.6kg ?Age: 88 ?Scr: 0.77 (05/28/21 via [facility])  ?CrCl: 58.41m/min ? ?Appropriate dose and refill sent to requested pharmacy.  ?

## 2022-04-25 NOTE — Telephone Encounter (Signed)
Patient requesting 90 d/s of xarelto '20mg'$   ?

## 2022-05-22 DIAGNOSIS — I10 Essential (primary) hypertension: Secondary | ICD-10-CM | POA: Diagnosis not present

## 2022-05-22 DIAGNOSIS — E785 Hyperlipidemia, unspecified: Secondary | ICD-10-CM | POA: Diagnosis not present

## 2022-05-29 DIAGNOSIS — I480 Paroxysmal atrial fibrillation: Secondary | ICD-10-CM | POA: Diagnosis not present

## 2022-05-29 DIAGNOSIS — I4519 Other right bundle-branch block: Secondary | ICD-10-CM | POA: Diagnosis not present

## 2022-05-29 DIAGNOSIS — Z79899 Other long term (current) drug therapy: Secondary | ICD-10-CM | POA: Diagnosis not present

## 2022-05-29 DIAGNOSIS — I10 Essential (primary) hypertension: Secondary | ICD-10-CM | POA: Diagnosis not present

## 2022-05-29 DIAGNOSIS — E785 Hyperlipidemia, unspecified: Secondary | ICD-10-CM | POA: Diagnosis not present

## 2022-05-29 DIAGNOSIS — C50912 Malignant neoplasm of unspecified site of left female breast: Secondary | ICD-10-CM | POA: Diagnosis not present

## 2022-06-04 DIAGNOSIS — I10 Essential (primary) hypertension: Secondary | ICD-10-CM | POA: Diagnosis not present

## 2022-06-04 DIAGNOSIS — E785 Hyperlipidemia, unspecified: Secondary | ICD-10-CM | POA: Diagnosis not present

## 2022-06-04 DIAGNOSIS — I480 Paroxysmal atrial fibrillation: Secondary | ICD-10-CM | POA: Diagnosis not present

## 2022-06-04 DIAGNOSIS — R7309 Other abnormal glucose: Secondary | ICD-10-CM | POA: Diagnosis not present

## 2022-06-04 DIAGNOSIS — Z853 Personal history of malignant neoplasm of breast: Secondary | ICD-10-CM | POA: Diagnosis not present

## 2022-06-05 ENCOUNTER — Encounter: Payer: Self-pay | Admitting: *Deleted

## 2022-06-05 NOTE — Progress Notes (Signed)
RN successfully faxed orders to [facility] per pt request.

## 2022-06-14 ENCOUNTER — Other Ambulatory Visit: Payer: Self-pay | Admitting: *Deleted

## 2022-06-14 DIAGNOSIS — M79606 Pain in leg, unspecified: Secondary | ICD-10-CM

## 2022-06-17 ENCOUNTER — Other Ambulatory Visit: Payer: Self-pay | Admitting: Hematology and Oncology

## 2022-06-17 ENCOUNTER — Ambulatory Visit
Admission: RE | Admit: 2022-06-17 | Discharge: 2022-06-17 | Disposition: A | Discharge status: Home / Self Care | Payer: Self-pay | Source: Ambulatory Visit | Attending: Hematology and Oncology | Admitting: Hematology and Oncology

## 2022-06-17 ENCOUNTER — Inpatient Hospital Stay
Admission: RE | Admit: 2022-06-17 | Discharge: 2022-06-17 | Disposition: A | Discharge status: Home / Self Care | Payer: Self-pay | Source: Ambulatory Visit | Attending: Hematology and Oncology | Admitting: Hematology and Oncology

## 2022-06-17 DIAGNOSIS — C50412 Malignant neoplasm of upper-outer quadrant of left female breast: Secondary | ICD-10-CM

## 2022-06-18 ENCOUNTER — Ambulatory Visit (HOSPITAL_COMMUNITY)
Admission: RE | Admit: 2022-06-18 | Discharge: 2022-06-18 | Disposition: A | Discharge status: Home / Self Care | Payer: Medicare Other | Source: Ambulatory Visit | Attending: Hematology and Oncology | Admitting: Hematology and Oncology

## 2022-06-18 DIAGNOSIS — C50412 Malignant neoplasm of upper-outer quadrant of left female breast: Secondary | ICD-10-CM

## 2022-06-18 DIAGNOSIS — R928 Other abnormal and inconclusive findings on diagnostic imaging of breast: Secondary | ICD-10-CM | POA: Diagnosis not present

## 2022-06-21 ENCOUNTER — Telehealth: Payer: Self-pay | Admitting: *Deleted

## 2022-06-21 NOTE — Telephone Encounter (Signed)
Received VM from pt.  RN attempt x1 to return call.  No answer, LVM for pt to return call to the office.  °

## 2022-06-26 ENCOUNTER — Ambulatory Visit (INDEPENDENT_AMBULATORY_CARE_PROVIDER_SITE_OTHER): Payer: Medicare Other

## 2022-06-26 ENCOUNTER — Ambulatory Visit: Payer: Medicare Other | Admitting: Vascular Surgery

## 2022-06-26 ENCOUNTER — Encounter: Payer: Self-pay | Admitting: Vascular Surgery

## 2022-06-26 VITALS — BP 131/81 | HR 59 | Temp 97.3°F | Ht 66.0 in | Wt 173.6 lb

## 2022-06-26 DIAGNOSIS — M79606 Pain in leg, unspecified: Secondary | ICD-10-CM | POA: Diagnosis not present

## 2022-06-26 NOTE — Progress Notes (Signed)
[facility] [place]  Patient name: Hailey Graham MRN: [id number] DOB: 1928-02-08 Sex: female  REASON FOR CONSULT: Evaluation pain in her right lower extremity, rule out arterial insufficiency  HPI: Hailey Graham is a 88 y.o. female, who is here today for discussion of discomfort in her right lower extremity.  She is a very pleasant active 88 year old.  She reports pain and achy sensation in her pretibial area and right lateral calf.  She reports that some days she has noticed comfort and other days she has significant aching which is limiting to her.  She walks with a cane when she is out in public but does not when she is at home.  She reports this helps with her balance.  She does have a history of right ankle fracture treated with plating in 2003.  She had right saphenous vein ablation with Dr. Kellie Simmering approximately 15 years ago.  She has no history of DVT.  She does have degenerative disc disease and has had spine injections with some mild temporary improvement in sciatic pain on the right.  Past Medical History:  Diagnosis Date   Allergy    Anxiety    Arthritis    Breast cancer of upper-outer quadrant of left female breast ([place]) 11/24/2015   Breast cancer of upper-outer quadrant of left female breast ([place]) 11/24/2015   Cataract    Complication of anesthesia    Depression    Diverticulosis    Gait abnormality    Gastroesophageal reflux disease    Glaucoma    Hyperlipidemia    Lipid profile in 05/2010:149, 86, 48, 84   Lung nodule    Right lower lobe; stable in 2010   Paroxysmal atrial fibrillation (HCC)    Rate related right bundle branch block; normal EF on echo in 2006; normal stress nuclear-2006; mild RV hypokinesis on MRI;  RA and RV enlargement on CT in 9/06; Anticoagulation->discontinued in 2009   Personal history of kidney stones    PONV (postoperative nausea and vomiting)    Nausea    Family History  Problem  Relation Age of Onset   Healthy Mother    Cerebral aneurysm Father    Lung cancer Sister     SOCIAL HISTORY: Social History   Socioeconomic History   Marital status: Widowed    Spouse name: Not on file   Number of children: 0   Years of education: 15   Highest education level: Not on file  Occupational History   Occupation: Retired  Tobacco Use   Smoking status: Never   Smokeless tobacco: Never  Vaping Use   Vaping Use: Never used  Substance and Sexual Activity   Alcohol use: No    Alcohol/week: 0.0 standard drinks of alcohol   Drug use: No   Sexual activity: Not Currently  Other Topics Concern   Not on file  Social History Narrative   Lives at home with husband.   Right-handed.   No caffeine use.   Social Determinants of Health   Financial Resource Strain: Not on file  Food Insecurity: Not on file  Transportation Needs: Not on file  Physical Activity: Not on file  Stress: Not on file  Social Connections: Not on file  Intimate Partner Violence: Not on file    Allergies  Allergen Reactions   Brimonidine Other (See Comments)    Crusting - difficulty opening eyes   Codeine     Unspecified   Morphine  Unspecified   Sulfa Drugs Cross Reactors Other (See Comments)    Unspecified    Current Outpatient Medications  Medication Sig Dispense Refill   acetaminophen (TYLENOL) 500 MG tablet Take 500 mg by mouth as needed for mild pain, moderate pain, fever or headache.     ALPRAZolam (XANAX) 0.5 MG tablet Take 0.25 mg by mouth 3 (three) times daily as needed for anxiety. Order is 0.05 tid prn     anastrozole (ARIMIDEX) 1 MG tablet Take 1 tablet (1 mg total) by mouth daily. 90 tablet 3   Cholecalciferol (VITAMIN D) 2000 UNITS tablet Take 2,000 Units by mouth daily.     Cyanocobalamin (B-12) 1000 MCG TABS Take by mouth daily.      docusate sodium (COLACE) 250 MG capsule Take 250 mg by mouth daily.     dorzolamide-timolol (COSOPT) 22.3-6.8 MG/ML ophthalmic solution  1 drop 2 (two) times daily.     ezetimibe-simvastatin (VYTORIN) 10-20 MG tablet TAKE 1 TABLET BY MOUTH ONCE DAILY. 90 tablet 3   famotidine (PEPCID) 20 MG tablet Take 1 tablet (20 mg total) by mouth at bedtime.     latanoprost (XALATAN) 0.005 % ophthalmic solution Place 1 drop into both eyes.      metoprolol succinate (TOPROL-XL) 25 MG 24 hr tablet TAKE (1/2) TABELT BY MOUTH TWICE DAILY 90 tablet 3   nystatin-triamcinolone (MYCOLOG II) cream Apply 1 application topically 2 (two) times daily. 30 g 0   pantoprazole (PROTONIX) 40 MG tablet TAKE (1) TABLET BY MOUTH EACH MORNING. 90 tablet 4   rivaroxaban (XARELTO) 20 MG TABS tablet Take 1 tablet (20 mg total) by mouth daily with supper. 90 tablet 1   ciprofloxacin (CIPRO) 250 MG tablet Take 250 mg by mouth 2 (two) times daily. (Patient not taking: Reported on 06/26/2022)     No current facility-administered medications for this visit.    REVIEW OF SYSTEMS:  '[X]'$  denotes positive finding, '[ ]'$  denotes negative finding Cardiac  Comments:  Chest pain or chest pressure:    Shortness of breath upon exertion:    Short of breath when lying flat:    Irregular heart rhythm: x       Vascular    Pain in calf, thigh, or hip brought on by ambulation: x   Pain in feet at night that wakes you up from your sleep:     Blood clot in your veins:    Leg swelling:  x       Pulmonary    Oxygen at home:    Productive cough:     Wheezing:         Neurologic    Sudden weakness in arms or legs:     Sudden numbness in arms or legs:     Sudden onset of difficulty speaking or slurred speech:    Temporary loss of vision in one eye:     Problems with dizziness:         Gastrointestinal    Blood in stool:     Vomited blood:         Genitourinary    Burning when urinating:     Blood in urine:        Psychiatric    Major depression:         Hematologic    Bleeding problems:    Problems with blood clotting too easily:        Skin    Rashes or ulcers:  Constitutional    Fever or chills:      PHYSICAL EXAM: Vitals:   06/26/22 1023  BP: 131/81  Pulse: (!) 59  Temp: (!) 97.3 F (36.3 C)  TempSrc: Temporal  SpO2: 94%  Weight: 173 lb 9.6 oz (78.7 kg)  Height: '5\' 6"'$  (1.676 m)    GENERAL: The patient is a well-nourished female, in no acute distress. The vital signs are documented above. CARDIOVASCULAR: 2+ radial and 2+ posterior tibial pulses bilaterally.  Extensive scattered telangiectasia on both lower extremities PULMONARY: There is good air exchange  MUSCULOSKELETAL: There are no major deformities or cyanosis. NEUROLOGIC: No focal weakness or paresthesias are detected. SKIN: There are no ulcers or rashes noted. PSYCHIATRIC: The patient has a normal affect.  DATA:  Noninvasive studies in our office today revealed normal ankle arm index and normal triphasic waveforms in both lower extremities  MEDICAL ISSUES: Discussed these findings with the patient.  Explained that that she does not have any evidence of arterial insufficiency to explain her lower extremity discomfort.  I also do not see any evidence of venous pathology that would explain this.  Does appear to be more neurogenic or musculoskeletal.  She was reassured with this discussion and will see [place] again on an as-needed basis   Rosetta Posner, MD FACS [facility] Tel [phone] Pager [phone]  Note: Portions of this report may have been transcribed using voice recognition software.  Every effort has been made to ensure accuracy; however, inadvertent computerized transcription errors may still be present.

## 2022-07-01 DIAGNOSIS — H401413 Capsular glaucoma with pseudoexfoliation of lens, right eye, severe stage: Secondary | ICD-10-CM | POA: Diagnosis not present

## 2022-07-01 DIAGNOSIS — H401423 Capsular glaucoma with pseudoexfoliation of lens, left eye, severe stage: Secondary | ICD-10-CM | POA: Diagnosis not present

## 2022-07-23 DIAGNOSIS — H401413 Capsular glaucoma with pseudoexfoliation of lens, right eye, severe stage: Secondary | ICD-10-CM | POA: Diagnosis not present

## 2022-07-23 DIAGNOSIS — H401423 Capsular glaucoma with pseudoexfoliation of lens, left eye, severe stage: Secondary | ICD-10-CM | POA: Diagnosis not present

## 2022-08-07 DIAGNOSIS — M1711 Unilateral primary osteoarthritis, right knee: Secondary | ICD-10-CM | POA: Diagnosis not present

## 2022-08-15 DIAGNOSIS — M1711 Unilateral primary osteoarthritis, right knee: Secondary | ICD-10-CM | POA: Diagnosis not present

## 2022-08-22 DIAGNOSIS — M1711 Unilateral primary osteoarthritis, right knee: Secondary | ICD-10-CM | POA: Diagnosis not present

## 2022-09-04 DIAGNOSIS — M5416 Radiculopathy, lumbar region: Secondary | ICD-10-CM | POA: Diagnosis not present

## 2022-09-04 DIAGNOSIS — M48061 Spinal stenosis, lumbar region without neurogenic claudication: Secondary | ICD-10-CM | POA: Diagnosis not present

## 2022-09-17 DIAGNOSIS — L570 Actinic keratosis: Secondary | ICD-10-CM | POA: Diagnosis not present

## 2022-09-23 DIAGNOSIS — K219 Gastro-esophageal reflux disease without esophagitis: Secondary | ICD-10-CM | POA: Diagnosis not present

## 2022-09-23 DIAGNOSIS — E785 Hyperlipidemia, unspecified: Secondary | ICD-10-CM | POA: Diagnosis not present

## 2022-09-23 DIAGNOSIS — I480 Paroxysmal atrial fibrillation: Secondary | ICD-10-CM | POA: Diagnosis not present

## 2022-09-23 DIAGNOSIS — R7303 Prediabetes: Secondary | ICD-10-CM | POA: Diagnosis not present

## 2022-09-23 DIAGNOSIS — I10 Essential (primary) hypertension: Secondary | ICD-10-CM | POA: Diagnosis not present

## 2022-09-30 DIAGNOSIS — Z23 Encounter for immunization: Secondary | ICD-10-CM | POA: Diagnosis not present

## 2022-09-30 DIAGNOSIS — I10 Essential (primary) hypertension: Secondary | ICD-10-CM | POA: Diagnosis not present

## 2022-09-30 DIAGNOSIS — M545 Low back pain, unspecified: Secondary | ICD-10-CM | POA: Diagnosis not present

## 2022-10-06 ENCOUNTER — Other Ambulatory Visit: Payer: Self-pay | Admitting: Student

## 2022-10-07 ENCOUNTER — Other Ambulatory Visit (HOSPITAL_COMMUNITY): Payer: Self-pay | Admitting: Hematology and Oncology

## 2022-10-07 DIAGNOSIS — R928 Other abnormal and inconclusive findings on diagnostic imaging of breast: Secondary | ICD-10-CM

## 2022-10-14 ENCOUNTER — Telehealth: Payer: Self-pay | Admitting: Cardiology

## 2022-10-14 MED ORDER — RIVAROXABAN 15 MG PO TABS: 15.0000 mg | ORAL_TABLET | Freq: Every day | ORAL | 1 refills | Status: DC

## 2022-10-14 NOTE — Telephone Encounter (Signed)
*  STAT* If patient is at the pharmacy, call can be transferred to refill team.   1. Which medications need to be refilled? (please list name of each medication and dose if known)  rivaroxaban (XARELTO) 20 MG TABS tablet  2. Which pharmacy/location (including street and city if local pharmacy) is medication to be sent to? [id number] - [place]  3. Do they need a 30 day or 90 day supply? 90 day supply

## 2022-10-14 NOTE — Telephone Encounter (Signed)
Yes, recommend switching to Xarelto '15mg'$  once daily

## 2022-10-14 NOTE — Telephone Encounter (Signed)
Called and told patient of dose reduction on Xarelto.  She verbalized understanding.  Refill sent in.

## 2022-10-14 NOTE — Addendum Note (Signed)
Addended by: Malen Gauze on: 10/14/2022 10:51 AM   Modules accepted: Orders

## 2022-10-14 NOTE — Telephone Encounter (Signed)
Prescription refill request for Xarelto received.  Indication: PAF Last office visit: 12/15/21  Shary Key MD Weight: 77.6kg Age: 88 Scr: 0.9 on 05/29/22 CrCl: 48.86  Based on above findings Xarelto '15mg'$  daily would be the appropriate dose.  Pt is currently on '20mg'$  daily.  Message sent to Pharmacy Pool to advise on dose reduction.

## 2022-10-27 NOTE — Progress Notes (Unsigned)
Cardiology Office Note:    Date:  10/27/2022   ID:  Hailey Graham, DOB [birth year], MRN [id number]  PCP:  Hailey Graham, [place] Providers Cardiologist:  Hailey Bergeron, MD { Click to update primary MD,subspecialty MD or APP then REFRESH:1}    Referring MD: Hailey Noble, MD   No chief complaint on file. ***  History of Present Illness:    [name] is a 88 y.o. female with a hx of ***  Past Medical History:  Diagnosis Date   Allergy    Anxiety    Arthritis    Breast cancer of upper-outer quadrant of left female breast ([place]) 11/24/2015   Breast cancer of upper-outer quadrant of left female breast ([place]) 11/24/2015   Cataract    Complication of anesthesia    Depression    Diverticulosis    Gait abnormality    Gastroesophageal reflux disease    Glaucoma    Hyperlipidemia    Lipid profile in 05/2010:149, 86, 48, 84   Lung nodule    Right lower lobe; stable in 2010   Paroxysmal atrial fibrillation (HCC)    Rate related right bundle branch block; normal EF on echo in 2006; normal stress nuclear-2006; mild RV hypokinesis on MRI;  RA and RV enlargement on CT in 9/06; Anticoagulation->discontinued in 2009   Personal history of kidney stones    PONV (postoperative nausea and vomiting)    Nausea    Past Surgical History:  Procedure Laterality Date   ABDOMINAL HYSTERECTOMY  2202   ANKLE SURGERY Right 2003    Fracture- rod   APPENDECTOMY     as a teenager   BREAST LUMPECTOMY WITH RADIOACTIVE SEED LOCALIZATION Left 12/13/2015   Procedure: BREAST LUMPECTOMY WITH RADIOACTIVE SEED LOCALIZATION;  Surgeon: Excell Seltzer, MD;  Location: [facility];  Service: General;  Laterality: Left;   CATARACT EXTRACTION W/ INTRAOCULAR LENS IMPLANT Right 2014   COLONOSCOPY W/ POLYPECTOMY     ESOPHAGOGASTRODUODENOSCOPY     GLAUCOMA SURGERY Right 2014   HERNIA REPAIR Right    1990's   ORIF ANKLE FRACTURE  2003   Right   TONSILLECTOMY     age 29   Varicose veins Right  2207    Current Medications: No outpatient medications have been marked as taking for the 10/29/22 encounter (Appointment) with Hailey Bergeron, MD.     Allergies:   Brimonidine, Codeine, Morphine, and Sulfa drugs cross reactors   Social History   Socioeconomic History   Marital status: Widowed    Spouse name: Not on file   Number of children: 0   Years of education: 15   Highest education level: Not on file  Occupational History   Occupation: Retired  Tobacco Use   Smoking status: Never   Smokeless tobacco: Never  Vaping Use   Vaping Use: Never used  Substance and Sexual Activity   Alcohol use: No    Alcohol/week: 0.0 standard drinks of alcohol   Drug use: No   Sexual activity: Not Currently  Other Topics Concern   Not on file  Social History Narrative   Lives at home with husband.   Right-handed.   No caffeine use.   Social Determinants of Health   Financial Resource Strain: Not on file  Food Insecurity: Not on file  Transportation Needs: Not on file  Physical Activity: Not on file  Stress: Not on file  Social Connections: Not on file     Family History: The  patient's ***family history includes Cerebral aneurysm in her father; Healthy in her mother; Lung cancer in her sister.  ROS:   Please see the history of present illness.    *** All other systems reviewed and are negative.  EKGs/Labs/Other Studies Reviewed:    The following studies were reviewed today: ***  EKG:  EKG is *** ordered today.  The ekg ordered today demonstrates ***  Recent Labs: No results found for requested labs within last 365 days.  Recent Lipid Panel    Component Value Date/Time   CHOL 137 07/23/2019 0000   TRIG 97 07/23/2019 0000   HDL 45 07/23/2019 0000   CHOLHDL 2.9 11/12/2017 0845   VLDL 13 11/12/2017 0845   LDLCALC 73 07/23/2019 0000     Risk Assessment/Calculations:   {Does this patient have ATRIAL FIBRILLATION?:[id number]}  No BP recorded.  {Refresh Note  OR Click here to enter BP  :1}***         Physical Exam:    VS:  There were no vitals taken for this visit.    Wt Readings from Last 3 Encounters:  06/26/22 173 lb 9.6 oz (78.7 kg)  12/05/21 171 lb (77.6 kg)  11/19/21 173 lb 4.8 oz (78.6 kg)     GEN: *** Well nourished, well developed in no acute distress HEENT: Normal NECK: No JVD; No carotid bruits LYMPHATICS: No lymphadenopathy CARDIAC: ***RRR, no murmurs, rubs, gallops RESPIRATORY:  Clear to auscultation without rales, wheezing or rhonchi  ABDOMEN: Soft, non-tender, non-distended MUSCULOSKELETAL:  No edema; No deformity  SKIN: Warm and dry NEUROLOGIC:  Alert and oriented x 3 PSYCHIATRIC:  Normal affect   ASSESSMENT:    No diagnosis found. PLAN:    In order of problems listed above:  ***      {Are you ordering a CV Procedure (e.g. stress test, cath, DCCV, TEE, etc)?   Press F2        :[id number]}    Medication Adjustments/Labs and Tests Ordered: Current medicines are reviewed at length with the patient today.  Concerns regarding medicines are outlined above.  No orders of the defined types were placed in this encounter.  No orders of the defined types were placed in this encounter.   There are no Patient Instructions on file for this visit.   Signed, Hailey Bergeron, MD  10/27/2022 8:09 PM    [place]

## 2022-10-28 NOTE — Progress Notes (Unsigned)
Cardiology Office Note:    Date:  10/28/2022   ID:  Hailey Graham, DOB 11-Jun-1928, MRN [id number]  PCP:  Asencion Noble, MD   [facility] Providers Cardiologist:  Freada Bergeron, MD {    Referring MD: Asencion Noble, MD    History of Present Illness:    Hailey Graham is a 88 y.o. female with a hx of paroxysmal atrial fibrillation, HLD, varicose veins, and history of breast cancer (s/p lumpectomy in 2016) who presents to clinic for follow-up. Was previously followed by Dr. Bronson Ing.  Was last seen in 11/2021 where she was doing well. Felt better on the lower dose of metoprolol 12.'5mg'$  BID. Continued to have mild DOE but it was stable and we were going to continue to monitor.  Today, the patient overall feels well. Struggles with balance issues and has been using a cane. No falls. Continues to have mild SOB that improves rapidly with rest. This is stable from prior visits. No chest pain, orthopnea, or PND. Tolerating xarelto with no bleeding issues. Trace edema that resolves with leg elevation. Remains active and does all ADLs. Still drives locally.  Contemplating moving to an assisted living facility closer to family.   Past Medical History:  Diagnosis Date   Allergy    Anxiety    Arthritis    Breast cancer of upper-outer quadrant of left female breast ([place]) 11/24/2015   Breast cancer of upper-outer quadrant of left female breast ([place]) 11/24/2015   Cataract    Complication of anesthesia    Depression    Diverticulosis    Gait abnormality    Gastroesophageal reflux disease    Glaucoma    Hyperlipidemia    Lipid profile in 05/2010:149, 86, 48, 84   Lung nodule    Right lower lobe; stable in 2010   Paroxysmal atrial fibrillation (HCC)    Rate related right bundle branch block; normal EF on echo in 2006; normal stress nuclear-2006; mild RV hypokinesis on MRI;  RA and RV enlargement on CT in 9/06; Anticoagulation->discontinued in 2009   Personal history of kidney stones     PONV (postoperative nausea and vomiting)    Nausea    Past Surgical History:  Procedure Laterality Date   ABDOMINAL HYSTERECTOMY  2202   ANKLE SURGERY Right 2003    Fracture- rod   APPENDECTOMY     as a teenager   BREAST LUMPECTOMY WITH RADIOACTIVE SEED LOCALIZATION Left 12/13/2015   Procedure: BREAST LUMPECTOMY WITH RADIOACTIVE SEED LOCALIZATION;  Surgeon: Excell Seltzer, MD;  Location: [facility];  Service: General;  Laterality: Left;   CATARACT EXTRACTION W/ INTRAOCULAR LENS IMPLANT Right 2014   COLONOSCOPY W/ POLYPECTOMY     ESOPHAGOGASTRODUODENOSCOPY     GLAUCOMA SURGERY Right 2014   HERNIA REPAIR Right    1990's   ORIF ANKLE FRACTURE  2003   Right   TONSILLECTOMY     age 41   Varicose veins Right 2207    Current Medications: No outpatient medications have been marked as taking for the 10/29/22 encounter (Appointment) with Freada Bergeron, MD.     Allergies:   Brimonidine, Codeine, Morphine, and Sulfa drugs cross reactors   Social History   Socioeconomic History   Marital status: Widowed    Spouse name: Not on file   Number of children: 0   Years of education: 15   Highest education level: Not on file  Occupational History   Occupation: Retired  Tobacco Use   Smoking status: Never  Smokeless tobacco: Never  Vaping Use   Vaping Use: Never used  Substance and Sexual Activity   Alcohol use: No    Alcohol/week: 0.0 standard drinks of alcohol   Drug use: No   Sexual activity: Not Currently  Other Topics Concern   Not on file  Social History Narrative   Lives at home with husband.   Right-handed.   No caffeine use.   Social Determinants of Health   Financial Resource Strain: Not on file  Food Insecurity: Not on file  Transportation Needs: Not on file  Physical Activity: Not on file  Stress: Not on file  Social Connections: Not on file     Family History: The patient's family history includes Cerebral aneurysm in her father; Healthy in her mother;  Lung cancer in her sister.  ROS:   Please see the history of present illness.    Review of Systems  Constitutional:  Negative for malaise/fatigue.  Eyes:  Negative for blurred vision.  Respiratory:  Positive for shortness of breath.   Cardiovascular:  Positive for leg swelling. Negative for chest pain, palpitations, orthopnea, claudication and PND.  Gastrointestinal:  Negative for blood in stool, melena, nausea and vomiting.  Genitourinary:  Negative for hematuria.  Musculoskeletal:  Positive for joint pain and myalgias.  Neurological:  Negative for dizziness and loss of consciousness.     EKGs/Labs/Other Studies Reviewed:    The following studies were reviewed today: Echocardiogram: 09/2019 IMPRESSIONS    1. Left ventricular ejection fraction, by visual estimation, is 60 to  65%. The left ventricle has normal function. There is moderately increased  left ventricular hypertrophy.   2. Left ventricular diastolic parameters are consistent with Grade I  diastolic dysfunction (impaired relaxation).   3. Global right ventricle has normal systolic function.The right  ventricular size is normal. No increase in right ventricular wall  thickness.   4. Left atrial size was normal.   5. Right atrial size was normal.   6. The mitral valve is grossly normal. No evidence of mitral valve  regurgitation.   7. The tricuspid valve is grossly normal. Tricuspid valve regurgitation  is trivial.   8. The aortic valve is tricuspid. Aortic valve regurgitation is mild.  Mild aortic valve sclerosis without stenosis.   9. The pulmonic valve was grossly normal. Pulmonic valve regurgitation is  not visualized.  10. The inferior vena cava is normal in size with greater than 50%  respiratory variability, suggesting right atrial pressure of 3 mmHg.   EKG: No new ECG.  Recent Labs: No results found for requested labs within last 365 days.  Recent Lipid Panel    Component Value Date/Time   CHOL 137  07/23/2019 0000   TRIG 97 07/23/2019 0000   HDL 45 07/23/2019 0000   CHOLHDL 2.9 11/12/2017 0845   VLDL 13 11/12/2017 0845   LDLCALC 73 07/23/2019 0000     Risk Assessment/Calculations:    CHA2DS2-VASc Score = 3  {This indicates a 3.2% annual risk of stroke. The patient's score is based upon: CHF History: 0 HTN History: 0 Diabetes History: 0 Stroke History: 0 Vascular Disease History: 0 Age Score: 2 Gender Score: 1          Physical Exam:    VS:  There were no vitals taken for this visit.    Wt Readings from Last 3 Encounters:  06/26/22 173 lb 9.6 oz (78.7 kg)  12/05/21 171 lb (77.6 kg)  11/19/21 173 lb 4.8 oz (78.6 kg)  GEN:  Well nourished, well developed in no acute distress HEENT: Normal NECK: No JVD; No carotid bruits CARDIAC: RRR, soft systolic murmur. No rubs or gallops. RESPIRATORY:  Clear to auscultation without rales, wheezing or rhonchi  ABDOMEN: Soft, non-tender, non-distended MUSCULOSKELETAL:  No edema; No deformity  SKIN: Warm and dry NEUROLOGIC:  Alert and oriented x 3 PSYCHIATRIC:  Normal affect   ASSESSMENT:    No diagnosis found.  PLAN:    In order of problems listed above:  #Paroxysmal Afib: Doing well with no significant palpitations. Tolerating xarelto without bleeding issues. -Continue metop 12.'5mg'$  XL BID -Continue xareltor '20mg'$  daily  #HLD: LDL 80. No known CAD. No further monitoring given advanced age. -Continue zetia-simva 10-'20mg'$  daily  #DOE: Chronic. Symptoms come on with exertion and resolve with rest. Have been overall stable with no associated chest pain, lightheadedness, dizziness or syncope. Given that she is still able to do all ADLs and her symptoms do not bother her too much, will continue with conservative management. If symptoms increase in frequency/severity or become more bothersome, can pursue stress testing at that time. -Continue watchful waiting and if increase in severity/frequency, will do myoview at  that time -Discussed option of TTE at this time as she may plan to move but she would like to re-visit the issue in 3 months.        Medication Adjustments/Labs and Tests Ordered: Current medicines are reviewed at length with the patient today.  Concerns regarding medicines are outlined above.  No orders of the defined types were placed in this encounter.  No orders of the defined types were placed in this encounter.   There are no Patient Instructions on file for this visit.   Signed, Freada Bergeron, MD  10/28/2022 12:56 PM    [place]

## 2022-10-29 ENCOUNTER — Encounter: Payer: Self-pay | Admitting: Cardiology

## 2022-10-29 ENCOUNTER — Ambulatory Visit: Payer: Medicare Other | Attending: Cardiology | Admitting: Cardiology

## 2022-10-29 VITALS — BP 120/80 | HR 55 | Ht 66.0 in | Wt 171.6 lb

## 2022-10-29 DIAGNOSIS — R0609 Other forms of dyspnea: Secondary | ICD-10-CM | POA: Diagnosis not present

## 2022-10-29 DIAGNOSIS — Z7901 Long term (current) use of anticoagulants: Secondary | ICD-10-CM

## 2022-10-29 DIAGNOSIS — E785 Hyperlipidemia, unspecified: Secondary | ICD-10-CM | POA: Diagnosis not present

## 2022-10-29 DIAGNOSIS — I480 Paroxysmal atrial fibrillation: Secondary | ICD-10-CM | POA: Diagnosis not present

## 2022-10-29 NOTE — Patient Instructions (Signed)
Medication Instructions:  Your physician recommends that you continue on your current medications as directed. Please refer to the Current Medication list given to you today.  *If you need a refill on your cardiac medications before your next appointment, please call your pharmacy*   Lab Work: NONE   If you have labs (blood work) drawn today and your tests are completely normal, you will receive your results only by: [place] (if you have MyChart) OR A paper copy in the mail If you have any lab test that is abnormal or we need to change your treatment, we will call you to review the results.   Testing/Procedures: NONE    Follow-Up: At [facility], you and your health needs are our priority.  As part of our continuing mission to provide you with exceptional heart care, we have created designated Provider Care Teams.  These Care Teams include your primary Cardiologist (physician) and Advanced Practice Providers (APPs -  Physician Assistants and Nurse Practitioners) who all work together to provide you with the care you need, when you need it.  We recommend signing up for the patient portal called "MyChart".  Sign up information is provided on this After Visit Summary.  MyChart is used to connect with patients for Virtual Visits (Telemedicine).  Patients are able to view lab/test results, encounter notes, upcoming appointments, etc.  Non-urgent messages can be sent to your provider as well.   To learn more about what you can do with MyChart, go to [email or web address].    Your next appointment:   3 month(s)  The format for your next appointment:   In Person  Provider:   You may see Freada Bergeron, MD or one of the following Advanced Practice Providers on your designated Care Team:   Bernerd Pho, PA-C  Ermalinda Barrios, PA-C     Other Instructions Thank you for choosing [facility]!    Important Information About [place]

## 2022-10-31 ENCOUNTER — Encounter: Payer: Self-pay | Admitting: Cardiology

## 2022-10-31 NOTE — Telephone Encounter (Signed)
Error

## 2022-11-07 ENCOUNTER — Other Ambulatory Visit: Payer: Self-pay | Admitting: Hematology and Oncology

## 2022-11-12 ENCOUNTER — Encounter (HOSPITAL_COMMUNITY): Payer: Self-pay

## 2022-11-12 ENCOUNTER — Ambulatory Visit (HOSPITAL_COMMUNITY)
Admission: RE | Admit: 2022-11-12 | Discharge: 2022-11-12 | Disposition: A | Discharge status: Home / Self Care | Payer: Medicare Other | Source: Ambulatory Visit | Attending: Hematology and Oncology | Admitting: Hematology and Oncology

## 2022-11-12 DIAGNOSIS — R928 Other abnormal and inconclusive findings on diagnostic imaging of breast: Secondary | ICD-10-CM

## 2022-11-12 DIAGNOSIS — R92323 Mammographic fibroglandular density, bilateral breasts: Secondary | ICD-10-CM | POA: Diagnosis not present

## 2022-11-15 NOTE — Progress Notes (Signed)
Patient Care Team: Asencion Noble, MD as PCP - General (Internal Medicine) Freada Bergeron, MD as PCP - Cardiology (Cardiology) Rogene Houston, MD (Gastroenterology) Excell Seltzer, MD (Inactive) as Consulting Physician (General Surgery) Nicholas Lose, MD as Consulting Physician (Hematology and Oncology) Arloa Koh, MD (Inactive) as Consulting Physician (Radiation Oncology) Sylvan Cheese, NP as Nurse Practitioner (Hematology and Oncology)  DIAGNOSIS:  Encounter Diagnosis  Name Primary?   Malignant neoplasm of upper-outer quadrant of left breast in female, estrogen receptor positive ([place]) Yes    SUMMARY OF ONCOLOGIC HISTORY: Oncology History  Breast cancer of upper-outer quadrant of left female breast ([place])  11/02/2015 Mammogram   screening mammogram category B breast density, asymmetry with indistinct margin in the left breast upper outer quadrant posterior depth, 2 internal mammary lymph nodes, 6 mm at 2:00 position axillary negative   11/21/2015 Initial Diagnosis   left breast biopsy: Invasive ductal carcinoma with associated low-grade DCIS with microcalcifications focally involving a papilloma, ER/PR 100%, HER-2 negative ratio 1.22   12/13/2015 Surgery   Left lumpectomy: Invasive ductal carcinoma 0.4 cm, DCIS 1.6 cm, clear margins, ER 100%, PR 100%, HER-2 negative ratio 1.22, Ki-67 5% T1 N0 stage IA   12/21/2015 - 03/20/2016 Anti-estrogen oral therapy   Tamoxifen 20 mg daily decreased to 10 mg daily, unable to tolerate it.   10/31/2020 Relapse/Recurrence   Recurrent disease: Multifocal  0.9 cm, 0.3 cm and an intramammary lymph node all 3 biopsy-proven grade 1 IDC ER/PR positive HER-2 equivocal FISH pending, Ki-67 2%     CHIEF COMPLIANT: Follow-up recurrent left breast cancer  INTERVAL HISTORY: Hailey Graham is a 88 y.o with the above-mentioned recurrent left breast cancer. Surgery was not an option. Currently been treated with anastrozole. She presents  to the clinic for a follow-up. She tolerating the anastrozole with no side effects or symptoms. She does have some complaints about her right lower leg she have some pain.    ALLERGIES:  is allergic to brimonidine, codeine, morphine, and sulfa drugs cross reactors.  MEDICATIONS:  Current Outpatient Medications  Medication Sig Dispense Refill   acetaminophen (TYLENOL) 500 MG tablet Take 500 mg by mouth as needed for mild pain, moderate pain, fever or headache.     ALPRAZolam (XANAX) 0.5 MG tablet Take 0.25 mg by mouth 3 (three) times daily as needed for anxiety. Order is 0.05 tid prn     Cholecalciferol (VITAMIN D) 2000 UNITS tablet Take 2,000 Units by mouth daily.     ciprofloxacin (CIPRO) 250 MG tablet Take 250 mg by mouth 2 (two) times daily.     Cyanocobalamin (B-12) 1000 MCG TABS Take by mouth daily.      docusate sodium (COLACE) 250 MG capsule Take 250 mg by mouth daily.     dorzolamide-timolol (COSOPT) 22.3-6.8 MG/ML ophthalmic solution 1 drop 2 (two) times daily.     ezetimibe-simvastatin (VYTORIN) 10-20 MG tablet TAKE 1 TABLET BY MOUTH ONCE DAILY. 90 tablet 0   famotidine (PEPCID) 20 MG tablet Take 1 tablet (20 mg total) by mouth at bedtime.     gabapentin (NEURONTIN) 100 MG capsule Take 200 mg by mouth at bedtime.     latanoprost (XALATAN) 0.005 % ophthalmic solution Place 1 drop into both eyes.      metoprolol succinate (TOPROL-XL) 25 MG 24 hr tablet TAKE (1/2) TABELT BY MOUTH TWICE DAILY 90 tablet 3   nystatin-triamcinolone (MYCOLOG II) cream Apply 1 application topically 2 (two) times daily. 30 g 0   pantoprazole (  PROTONIX) 40 MG tablet TAKE (1) TABLET BY MOUTH EACH MORNING. 90 tablet 4   pilocarpine (PILOCAR) 1 % ophthalmic solution Place 1 drop into the right eye 4 times daily.     Rivaroxaban (XARELTO) 15 MG TABS tablet Take 1 tablet (15 mg total) by mouth daily with supper. 90 tablet 1   anastrozole (ARIMIDEX) 1 MG tablet Take 1 tablet (1 mg total) by mouth daily. 90 tablet 3    No current facility-administered medications for this visit.    PHYSICAL EXAMINATION: ECOG PERFORMANCE STATUS: 1 - Symptomatic but completely ambulatory  Vitals:   11/19/22 1130  BP: (!) 156/80  Pulse: 62  Resp: 18  Temp: 97.8 F (36.6 C)  SpO2: 96%   Filed Weights   11/19/22 1130  Weight: 170 lb 9.6 oz (77.4 kg)     LABORATORY DATA:  I have reviewed the data as listed    Latest Ref Rng & Units 07/23/2019   12:00 AM 11/26/2016   12:58 PM 12/12/2015   11:37 AM  CMP  Glucose 65 - 99 mg/dL  78  108   BUN 8 - 27 mg/dL  21  24   Creatinine 0.5 - 1.1 0.7     0.73  0.81   Sodium 134 - 144 mmol/L  141  142   Potassium 3.5 - 5.2 mmol/L  4.4  4.3   Chloride 96 - 106 mmol/L  103  109   CO2 18 - 29 mmol/L  25  28   Calcium 8.7 - 10.7 10.1     9.7  9.9   Alkaline Phos 25 - 125 74        AST 13 - 35 17        ALT 7 - 35 16           This result is from an external source.    Lab Results  Component Value Date   WBC 5.6 07/23/2019   HGB 13.7 07/23/2019   HCT 43 07/23/2019   MCV 89 11/26/2016   PLT 215 12/12/2015   NEUTROABS 3 07/23/2019    ASSESSMENT & PLAN:  Breast cancer of upper-outer quadrant of left female breast ([place]) Left lumpectomy 12/13/2015: Invasive ductal carcinoma 0.4 cm, DCIS 1.6 cm, clear margins, ER 100%, PR 100%, HER-2 negative ratio 1.22, Ki-67 5% T1 N0 stage IA 10/31/2020: Left breast biopsy for 0.9 cm mass at 2 o'clock position: Grade 1 IDC ER greater than 95%, PR greater than 95%, HER-2 2+ by IHC FISH negative, intramammary lymph node biopsy: Positive for cancer, left breast biopsy 3 o'clock position: Grade 1 IDC with DCIS   Tamoxifen started 12/21/2015 discontinued 03/20/2016 (for severe hot flashes, night sweats) Current treatment: Anastrozole Anastrozole toxicities: Tolerating it extremely well.   Breast Cancer Surveillance: 1. Breast exam 11/19/2022: Small palpable skin nodule the surgical scar in the left breast: Biopsy scar tissue 2.  Mammogram: And ultrasound : Decrease in size of the breast lesion (0.5 cm compared to 0.7 cm) She will now get yearly mammograms at [facility]   Return to clinic in 1 year for follow-up    Orders Placed This Encounter  Procedures   MM DIAG BREAST TOMO BILATERAL    Standing Status:   Future    Standing Expiration Date:   11/20/2023    Order Specific Question:   Reason for Exam (SYMPTOM  OR DIAGNOSIS REQUIRED)    Answer:   Annual mammograms    Order Specific Question:   Preferred imaging  location?    Answer:   [facility]    Order Specific Question:   Release to patient    Answer:   Immediate   The patient has a good understanding of the overall plan. she agrees with it. she will call with any problems that may develop before the next visit here. Total time spent: 30 mins including face to face time and time spent for planning, charting and co-ordination of care   Harriette Ohara, MD 11/19/22    I Gardiner Coins am scribing for Dr. Lindi Adie  I have reviewed the above documentation for accuracy and completeness, and I agree with the above.

## 2022-11-19 ENCOUNTER — Inpatient Hospital Stay: Payer: Medicare Other | Attending: Hematology and Oncology | Admitting: Hematology and Oncology

## 2022-11-19 ENCOUNTER — Other Ambulatory Visit: Payer: Self-pay

## 2022-11-19 VITALS — BP 156/80 | HR 62 | Temp 97.8°F | Resp 18 | Ht 66.0 in | Wt 170.6 lb

## 2022-11-19 DIAGNOSIS — Z79811 Long term (current) use of aromatase inhibitors: Secondary | ICD-10-CM | POA: Diagnosis not present

## 2022-11-19 DIAGNOSIS — Z170 Estrogen receptor positive status [ER+]: Secondary | ICD-10-CM | POA: Diagnosis not present

## 2022-11-19 DIAGNOSIS — C50412 Malignant neoplasm of upper-outer quadrant of left female breast: Secondary | ICD-10-CM | POA: Insufficient documentation

## 2022-11-19 MED ORDER — ANASTROZOLE 1 MG PO TABS: 1.0000 mg | ORAL_TABLET | Freq: Every day | ORAL | 3 refills | Status: DC

## 2022-11-19 NOTE — Assessment & Plan Note (Signed)
Left lumpectomy 12/13/2015: Invasive ductal carcinoma 0.4 cm, DCIS 1.6 cm, clear margins, ER 100%, PR 100%, HER-2 negative ratio 1.22, Ki-67 5% T1 N0 stage IA 10/31/2020: Left breast biopsy for 0.9 cm mass at 2 o'clock position: Grade 1 IDC ER greater than 95%, PR greater than 95%, HER-2 2+ by IHC FISH negative, intramammary lymph node biopsy: Positive for cancer, left breast biopsy 3 o'clock position: Grade 1 IDC with DCIS   Tamoxifen started 12/21/2015 discontinued 03/20/2016 (for severe hot flashes, night sweats) Current treatment: Anastrozole Anastrozole toxicities: Tolerating it extremely well.   Breast Cancer Surveillance: 1. Breast exam 11/19/2022: Small palpable skin nodule the surgical scar in the left breast: Biopsy scar tissue 2. Mammogram: And ultrasound : Decrease in size of the breast lesion (0.5 cm compared to 0.7 cm)    Return to clinic in 1 year for follow-up 

## 2022-11-29 DIAGNOSIS — Z79899 Other long term (current) drug therapy: Secondary | ICD-10-CM | POA: Diagnosis not present

## 2022-11-29 DIAGNOSIS — E785 Hyperlipidemia, unspecified: Secondary | ICD-10-CM | POA: Diagnosis not present

## 2022-11-29 DIAGNOSIS — C50912 Malignant neoplasm of unspecified site of left female breast: Secondary | ICD-10-CM | POA: Diagnosis not present

## 2022-11-29 DIAGNOSIS — I10 Essential (primary) hypertension: Secondary | ICD-10-CM | POA: Diagnosis not present

## 2022-12-09 DIAGNOSIS — H401423 Capsular glaucoma with pseudoexfoliation of lens, left eye, severe stage: Secondary | ICD-10-CM | POA: Diagnosis not present

## 2022-12-09 DIAGNOSIS — H401413 Capsular glaucoma with pseudoexfoliation of lens, right eye, severe stage: Secondary | ICD-10-CM | POA: Diagnosis not present

## 2023-01-13 ENCOUNTER — Telehealth: Payer: Self-pay | Admitting: Physician Assistant

## 2023-01-13 NOTE — Telephone Encounter (Signed)
Patient uses [facility] in [place] given 90 day supply with RF:1 on October 25/2023

## 2023-01-13 NOTE — Telephone Encounter (Signed)
Covering B. Strader's inbox. Received notification from patient's [facility] that patient is not on anticoagulant with atrial fib. However, chart review would show she is on Xarelto. Can you please contact patient to confirm she is on this? Thank you!  (For documentation only: Last Cr 0.9 with CrCl 48.109m/min so dose remains appropriate.)

## 2023-01-15 DIAGNOSIS — M79661 Pain in right lower leg: Secondary | ICD-10-CM | POA: Diagnosis not present

## 2023-01-16 ENCOUNTER — Encounter: Payer: Self-pay | Admitting: Hematology and Oncology

## 2023-01-20 ENCOUNTER — Encounter: Payer: Self-pay | Admitting: Otolaryngology

## 2023-01-23 ENCOUNTER — Other Ambulatory Visit (HOSPITAL_COMMUNITY): Payer: Self-pay | Admitting: Otolaryngology

## 2023-01-23 DIAGNOSIS — M79661 Pain in right lower leg: Secondary | ICD-10-CM

## 2023-02-06 ENCOUNTER — Ambulatory Visit: Payer: Medicare Other | Admitting: Cardiology

## 2023-02-09 ENCOUNTER — Other Ambulatory Visit: Payer: Self-pay | Admitting: Student

## 2023-02-17 DIAGNOSIS — L570 Actinic keratosis: Secondary | ICD-10-CM | POA: Diagnosis not present

## 2023-02-17 DIAGNOSIS — D239 Other benign neoplasm of skin, unspecified: Secondary | ICD-10-CM | POA: Diagnosis not present

## 2023-02-19 ENCOUNTER — Telehealth: Payer: Self-pay | Admitting: Cardiology

## 2023-02-19 ENCOUNTER — Other Ambulatory Visit: Payer: Self-pay | Admitting: *Deleted

## 2023-02-19 MED ORDER — RIVAROXABAN 15 MG PO TABS: 15.0000 mg | ORAL_TABLET | Freq: Every day | ORAL | 1 refills | Status: DC

## 2023-02-19 NOTE — Telephone Encounter (Signed)
Pt c/o medication issue:  1. Name of Medication: Rivaroxaban (XARELTO) 15 MG TABS tablet   2. How are you currently taking this medication (dosage and times per day)?   3. Are you having a reaction (difficulty breathing--STAT)?   4. What is your medication issue?   Pt would like to speak to [facility], LPN about this prescription dosage

## 2023-02-19 NOTE — Telephone Encounter (Signed)
Pt requesting refill on Xarelto 15 mg. Note sent to Edrick Oh, RN.

## 2023-02-19 NOTE — Telephone Encounter (Signed)
Prescription refill request for Xarelto received.  Indication: PAF Last office visit: 10/29/22  Shary Key MD Weight: 77.8kg Age: 89 Scr: 0.90 on 05/29/22  KPN CrCl: 48.99  Based on above findings Xarelto '15mg'$  daily is the appropriate dose.  Refill approved.

## 2023-03-11 ENCOUNTER — Ambulatory Visit (HOSPITAL_COMMUNITY)
Admission: RE | Admit: 2023-03-11 | Discharge: 2023-03-11 | Disposition: A | Discharge status: Home / Self Care | Payer: Medicare Other | Source: Ambulatory Visit | Attending: Otolaryngology | Admitting: Otolaryngology

## 2023-03-11 DIAGNOSIS — M79661 Pain in right lower leg: Secondary | ICD-10-CM | POA: Diagnosis not present

## 2023-03-11 DIAGNOSIS — M25561 Pain in right knee: Secondary | ICD-10-CM | POA: Diagnosis not present

## 2023-03-13 ENCOUNTER — Ambulatory Visit: Payer: Medicare Other | Admitting: Cardiology

## 2023-03-19 ENCOUNTER — Telehealth: Payer: Self-pay | Admitting: Cardiology

## 2023-03-19 ENCOUNTER — Encounter: Payer: Self-pay | Admitting: Cardiology

## 2023-03-19 NOTE — Telephone Encounter (Signed)
Error

## 2023-03-19 NOTE — Telephone Encounter (Signed)
   Primary Cardiologist: Freada Bergeron, MD  Chart reviewed as part of pre-operative protocol coverage. Simple dental extractions (1-2 teeth) are considered low risk procedures per guidelines and generally do not require any specific cardiac clearance. It is also generally accepted that for simple extractions and dental cleanings, there is no need to interrupt blood thinner therapy.   SBE prophylaxis is not required for the patient.  I will route this recommendation to the requesting party via Epic fax function and remove from pre-op pool.  Please call with questions.  Emmaline Life, NP-C  03/19/2023, 12:54 PM [address] Office [phone] Fax [phone]

## 2023-03-19 NOTE — Telephone Encounter (Signed)
   [facility] Pre-operative Risk Assessment    Request for surgical clearance:  What type of surgery is being performed?  Extraction of tooth #10  When is this surgery scheduled?  TBD   What type of clearance is required (medical clearance vs. Pharmacy clearance to hold med vs. Both)?  Both   Are there any medications that need to be held prior to surgery and how long? Their office is requesting office advisement on holding blood thinner   Practice name and name of physician performing surgery?  [facility]  Faythe Ghee, DMD  What is your office phone number? [phone]    7.   What is your office fax number? [phone]  8.   Anesthesia type (None, local, MAC, general)? Local   Hailey Graham 03/19/2023, 12:16 PM

## 2023-04-01 ENCOUNTER — Ambulatory Visit: Payer: Medicare Other | Attending: Cardiology | Admitting: Student

## 2023-04-01 ENCOUNTER — Encounter: Payer: Self-pay | Admitting: Student

## 2023-04-01 VITALS — BP 128/70 | HR 52 | Ht 66.0 in | Wt 167.0 lb

## 2023-04-01 DIAGNOSIS — E782 Mixed hyperlipidemia: Secondary | ICD-10-CM | POA: Diagnosis not present

## 2023-04-01 DIAGNOSIS — R0609 Other forms of dyspnea: Secondary | ICD-10-CM

## 2023-04-01 DIAGNOSIS — I480 Paroxysmal atrial fibrillation: Secondary | ICD-10-CM

## 2023-04-01 DIAGNOSIS — Z7901 Long term (current) use of anticoagulants: Secondary | ICD-10-CM

## 2023-04-01 NOTE — Progress Notes (Signed)
Cardiology Office Note    Date:  04/01/2023  ID:  Hailey Graham, DOB 1928-08-19, MRN [id number] Cardiologist: Meriam Sprague, MD  --> Patient requests to follow-up in [facility]  History of Present Illness:    Hailey Graham is a 89 y.o. female with past medical history of paroxysmal atrial fibrillation, HLD, varicose veins and history of breast cancer (s/p lumpectomy in 2016) who presents to the office today for overdue 65-month follow-up.  She was last examined by Dr. Shari Prows in 10/2022 and reported having balance issues and mild dyspnea but denied any chest pain or palpitations. Options for workup of her dyspnea on exertion were reviewed with the patient but she preferred conservative management at that time and it was recommended that if symptoms increased in frequency or severity, could pursue stress testing and a follow-up echocardiogram. She was continued on her current cardiac medications including Toprol-XL 12.5 mg twice daily, Vytorin 10/20 mg daily and Xarelto for anticoagulation.  In talking with the patient today, she reports her biggest issue over the past several months has been worsening sciatica. She was started on Gabapentin by her PCP with minimal improvement in her symptoms. She is scheduled to follow-up with Neurology in the coming months. From a cardiac perspective, she reports overall doing well. Says that her respiratory status has been stable with no progression of dyspnea on exertion. She denies any recent chest pain or palpitations. No specific orthopnea, PND or pitting edema. She is concerned about the cost of Xarelto as she reports being denied for patient assistance.  Studies Reviewed:   EKG: EKG is not ordered today. EKG from 10/29/2022 is reviewed and shows NSR, HR 62 with RBBB.    Echocardiogram: 09/2019 IMPRESSIONS     1. Left ventricular ejection fraction, by visual estimation, is 60 to  65%. The left ventricle has normal function. There is  moderately increased  left ventricular hypertrophy.   2. Left ventricular diastolic parameters are consistent with Grade I  diastolic dysfunction (impaired relaxation).   3. Global right ventricle has normal systolic function.The right  ventricular size is normal. No increase in right ventricular wall  thickness.   4. Left atrial size was normal.   5. Right atrial size was normal.   6. The mitral valve is grossly normal. No evidence of mitral valve  regurgitation.   7. The tricuspid valve is grossly normal. Tricuspid valve regurgitation  is trivial.   8. The aortic valve is tricuspid. Aortic valve regurgitation is mild.  Mild aortic valve sclerosis without stenosis.   9. The pulmonic valve was grossly normal. Pulmonic valve regurgitation is  not visualized.  10. The inferior vena cava is normal in size with greater than 50%  respiratory variability, suggesting right atrial pressure of 3 mmHg.     Physical Exam:   VS:  BP 128/70   Pulse (!) 52   Ht 5\' 6"  (1.676 m)   Wt 167 lb (75.8 kg)   BMI 26.95 kg/m    Wt Readings from Last 3 Encounters:  04/01/23 167 lb (75.8 kg)  11/19/22 170 lb 9.6 oz (77.4 kg)  10/29/22 171 lb 9.6 oz (77.8 kg)     GEN: Pleasant, elderly female appearing in no acute distress NECK: No JVD; No carotid bruits CARDIAC: Irregularly irregular, no murmurs, rubs, gallops RESPIRATORY:  Clear to auscultation without rales, wheezing or rhonchi  ABDOMEN: Appears non-distended. No obvious abdominal masses. EXTREMITIES: No clubbing or cyanosis. No pitting edema.  Distal pedal  pulses are 2+ bilaterally.   Assessment and Plan:   1. Paroxysmal Atrial Fibrillation/Use of Long-term Anticoagulation - She denies any recent palpitations and heart rate is well-controlled in the 50's to 60's during today's visit. She remains on Toprol-XL 12.5 mg twice daily for rate control as she experienced fatigue when taking this all at once. - She is on Xarelto 15 mg daily for  anticoagulation which is the appropriate dose given her calculated creatinine clearance. Her creatinine was at 0.99 in 11/2022 and we will request a copy of most recent labs from her PCP.  She reports being denied for patient assistance for Xarelto and is interested in switching to Eliquis. Will provide with patient assistance information for this today and the correct dose would be 5 mg BID (only indication for reduced dosing is age). She is not interested in Coumadin.  2. Dyspnea on Exertion - She reports this has overall been stable with no progression of symptoms. She has preferred conservative management and is not interested in stress testing as discussed during prior office visits. She was encouraged to make [place] aware of any progression in symptoms as follow-up testing could be arranged.  3. HLD - Followed by her PCP. We will request a copy of her most recent labs. She remains on Vytorin 10-20 mg daily.  Signed, Ellsworth Lennox, PA-C

## 2023-04-01 NOTE — Patient Instructions (Signed)
    Medication Instructions:  Your physician recommends that you continue on your current medications as directed. Please refer to the Current Medication list given to you today.   Labwork: None today  Testing/Procedures: None today  Follow-Up: 6 months Dr.Mallipeddi or [place] Strader,PA-C  Any Other Special Instructions Will Be Listed Below (If Applicable).  If you need a refill on your cardiac medications before your next appointment, please call your pharmacy.

## 2023-04-02 ENCOUNTER — Encounter: Payer: Self-pay | Admitting: Internal Medicine

## 2023-04-03 ENCOUNTER — Other Ambulatory Visit: Payer: Self-pay | Admitting: Student

## 2023-04-07 DIAGNOSIS — E785 Hyperlipidemia, unspecified: Secondary | ICD-10-CM | POA: Diagnosis not present

## 2023-04-07 DIAGNOSIS — R7303 Prediabetes: Secondary | ICD-10-CM | POA: Diagnosis not present

## 2023-04-07 DIAGNOSIS — I10 Essential (primary) hypertension: Secondary | ICD-10-CM | POA: Diagnosis not present

## 2023-04-14 ENCOUNTER — Telehealth: Payer: Self-pay | Admitting: Cardiology

## 2023-04-14 DIAGNOSIS — E210 Primary hyperparathyroidism: Secondary | ICD-10-CM | POA: Diagnosis not present

## 2023-04-14 DIAGNOSIS — I10 Essential (primary) hypertension: Secondary | ICD-10-CM | POA: Diagnosis not present

## 2023-04-14 NOTE — Telephone Encounter (Signed)
Spoke with pt who states that she received a letter stating that she was denied patient assistance from [employer] for Eliquis. Pt would like to try for pt assistance from Xarelto. Forms printed and placed up front for pt to fill out.

## 2023-04-14 NOTE — Telephone Encounter (Signed)
Patient is asking that you give her a call. Once you get a chance. Please advise

## 2023-04-17 ENCOUNTER — Telehealth: Payer: Self-pay | Admitting: Cardiology

## 2023-04-17 DIAGNOSIS — H401423 Capsular glaucoma with pseudoexfoliation of lens, left eye, severe stage: Secondary | ICD-10-CM | POA: Diagnosis not present

## 2023-04-17 DIAGNOSIS — H401411 Capsular glaucoma with pseudoexfoliation of lens, right eye, mild stage: Secondary | ICD-10-CM | POA: Diagnosis not present

## 2023-04-17 NOTE — Telephone Encounter (Signed)
Called pt to notify that all paperwork was not filled out, no answer left msg with information.

## 2023-04-17 NOTE — Telephone Encounter (Signed)
Marjo Bicker is calling stating they received the patient's application for Xarelto, but it is missing information. She reports on pg 5 the patient did not sign or date, and on pg 6 the zip code and pt's gender is not listed. Please advise.

## 2023-04-24 ENCOUNTER — Telehealth: Payer: Self-pay | Admitting: Cardiology

## 2023-04-24 NOTE — Telephone Encounter (Signed)
Pt request that letter be faxed to Xarelto at [phone].

## 2023-04-24 NOTE — Telephone Encounter (Signed)
Patient requested a call back directly from [employer].

## 2023-04-24 NOTE — Telephone Encounter (Signed)
Patient requests note stating she lives a lone and cannot afford Xarelto.  K.[employer],LPN is working on this,patient aware. Financial information is needed to be included in letter.

## 2023-04-29 ENCOUNTER — Encounter: Payer: Self-pay | Admitting: *Deleted

## 2023-04-30 NOTE — Telephone Encounter (Signed)
Received a call from [place] from [facility] pt assistance. She states that the pt income exceeds their income level. She states that pt has been enrolled in another program and a shipment sent to pt. Pt notified and will keep office updated.

## 2023-05-23 DIAGNOSIS — I10 Essential (primary) hypertension: Secondary | ICD-10-CM | POA: Diagnosis not present

## 2023-05-23 DIAGNOSIS — E785 Hyperlipidemia, unspecified: Secondary | ICD-10-CM | POA: Diagnosis not present

## 2023-06-06 IMAGING — DX DG CHEST 2V
2 series · 2 of 2 positions shown · non-contrast
Comparison: Chest x-ray 09/09/2018.

CLINICAL DATA: [AGE] female with history of shortness of
breath on exertion for the past several months.

EXAM:
CHEST - 2 VIEW

[chest pa]
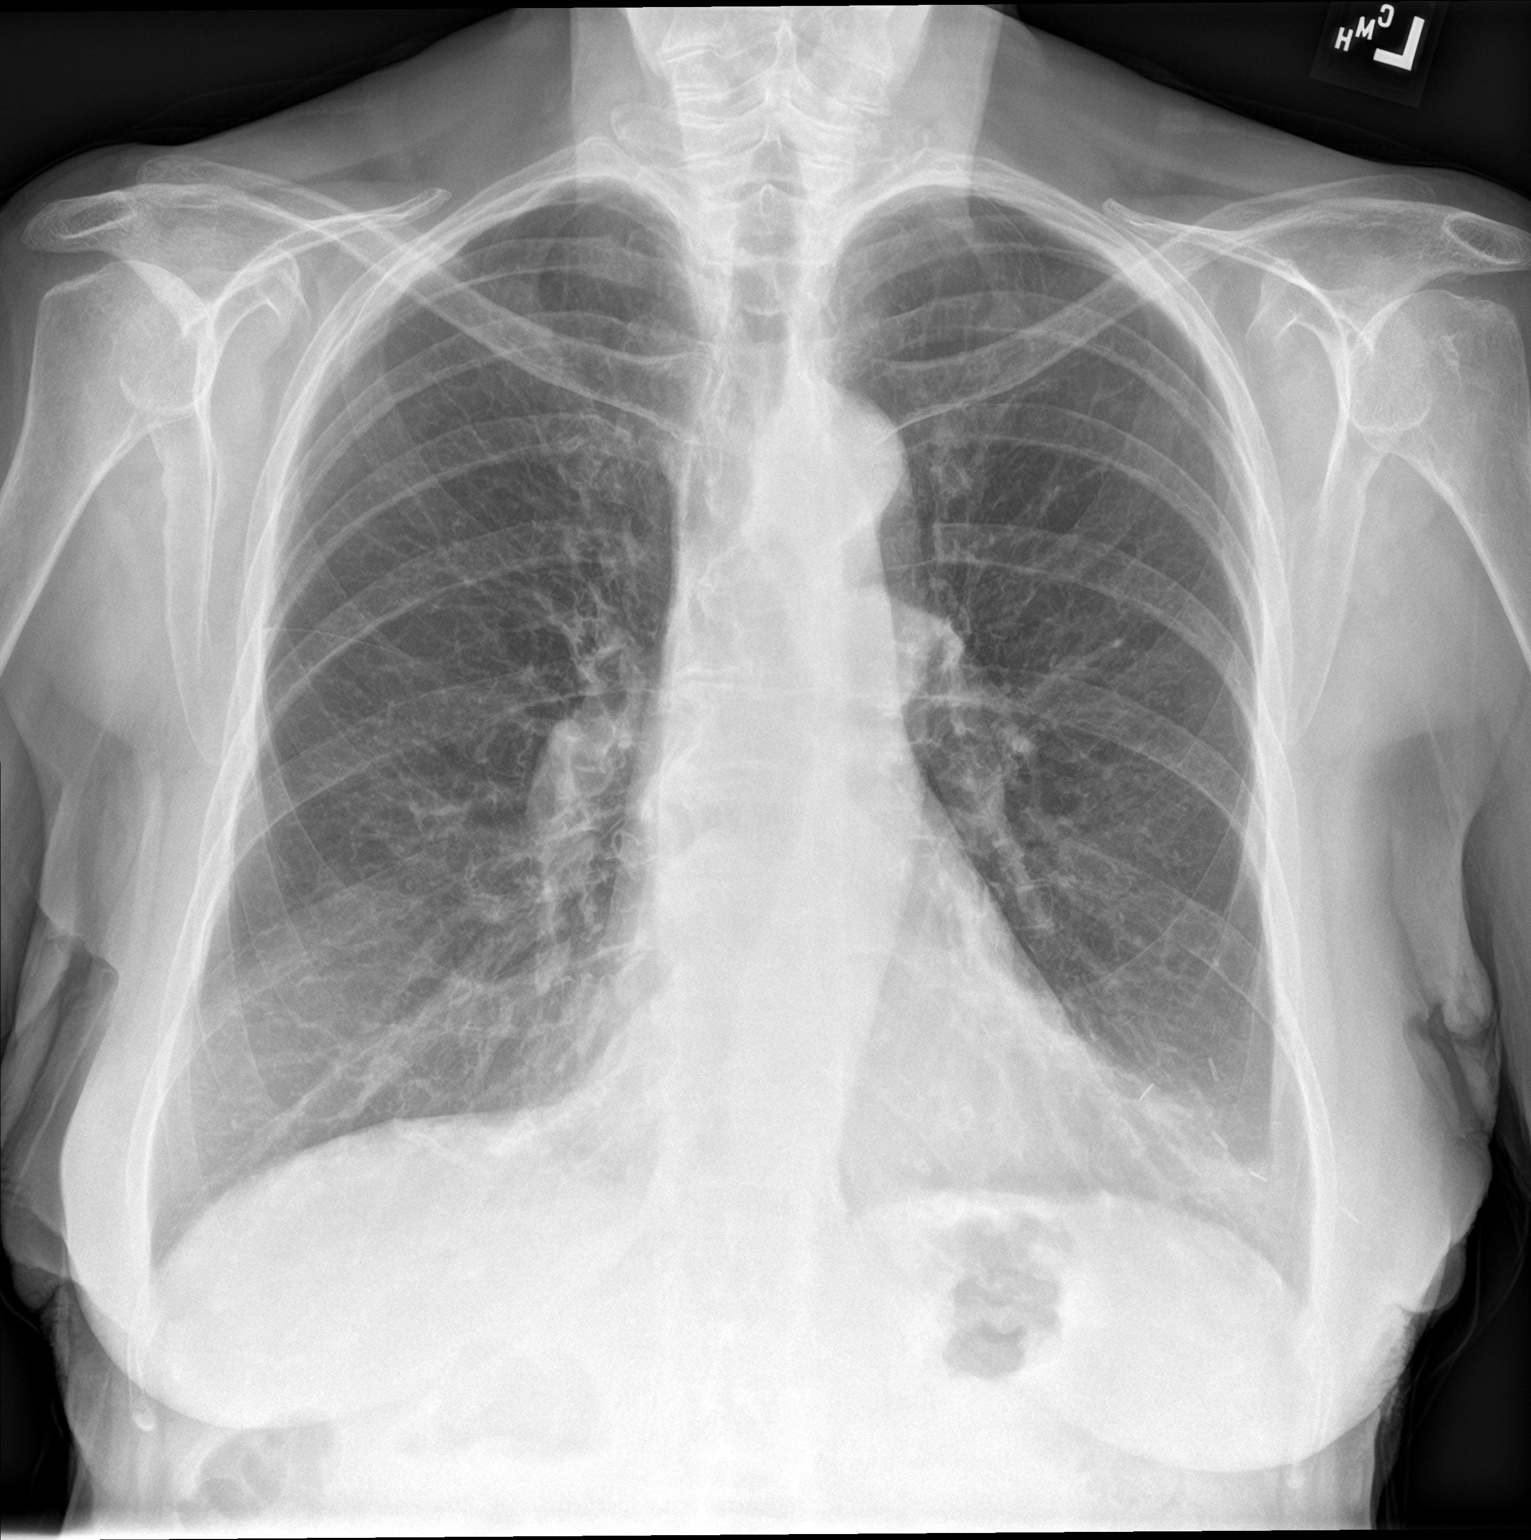

[chest lat]
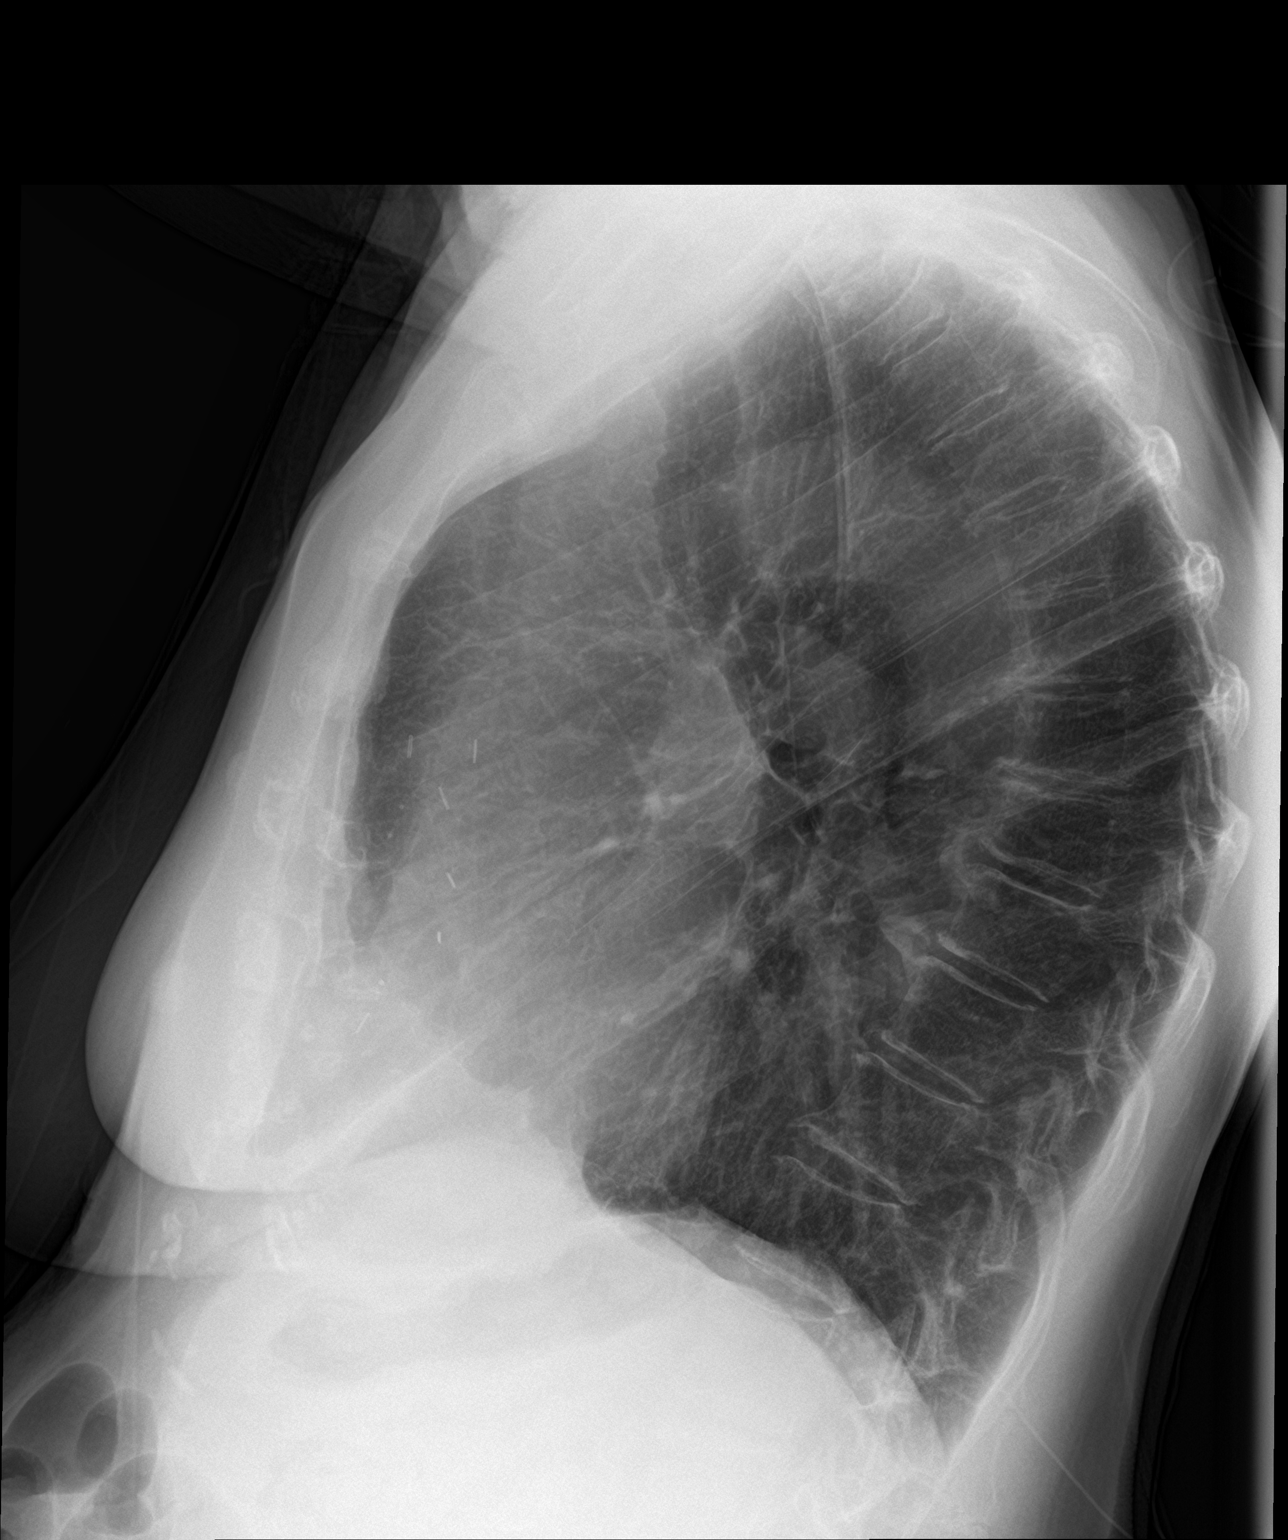

[2 of 2 positions shown; findings below may reference images not displayed]

FINDINGS: Lung volumes are normal. No consolidative airspace disease. No
pleural effusions. No pneumothorax. No pulmonary nodule or mass
noted. Pulmonary vasculature and the cardiomediastinal silhouette
are within normal limits. Surgical clips project over the left
breast.
IMPRESSION: 1.  No radiographic evidence of acute cardiopulmonary disease.

## 2023-06-30 ENCOUNTER — Ambulatory Visit: Payer: Medicare Other | Admitting: Neurology

## 2023-07-16 DIAGNOSIS — H401423 Capsular glaucoma with pseudoexfoliation of lens, left eye, severe stage: Secondary | ICD-10-CM | POA: Diagnosis not present

## 2023-07-16 DIAGNOSIS — H401411 Capsular glaucoma with pseudoexfoliation of lens, right eye, mild stage: Secondary | ICD-10-CM | POA: Diagnosis not present

## 2023-07-21 ENCOUNTER — Other Ambulatory Visit (HOSPITAL_COMMUNITY): Payer: Self-pay | Admitting: Hematology and Oncology

## 2023-07-21 DIAGNOSIS — Z853 Personal history of malignant neoplasm of breast: Secondary | ICD-10-CM

## 2023-08-06 DIAGNOSIS — E210 Primary hyperparathyroidism: Secondary | ICD-10-CM | POA: Diagnosis not present

## 2023-08-13 DIAGNOSIS — I480 Paroxysmal atrial fibrillation: Secondary | ICD-10-CM | POA: Diagnosis not present

## 2023-08-13 DIAGNOSIS — E210 Primary hyperparathyroidism: Secondary | ICD-10-CM | POA: Diagnosis not present

## 2023-08-26 DIAGNOSIS — D485 Neoplasm of uncertain behavior of skin: Secondary | ICD-10-CM | POA: Diagnosis not present

## 2023-08-26 DIAGNOSIS — L570 Actinic keratosis: Secondary | ICD-10-CM | POA: Diagnosis not present

## 2023-09-24 ENCOUNTER — Ambulatory Visit: Payer: Medicare Other | Admitting: Student

## 2023-10-17 DIAGNOSIS — Z23 Encounter for immunization: Secondary | ICD-10-CM | POA: Diagnosis not present

## 2023-10-20 ENCOUNTER — Other Ambulatory Visit: Payer: Self-pay | Admitting: *Deleted

## 2023-10-20 MED ORDER — EZETIMIBE-SIMVASTATIN 10-20 MG PO TABS: 1.0000 | ORAL_TABLET | Freq: Every day | ORAL | 2 refills | Status: DC

## 2023-10-31 ENCOUNTER — Other Ambulatory Visit: Payer: Self-pay | Admitting: Student

## 2023-11-18 ENCOUNTER — Ambulatory Visit (HOSPITAL_COMMUNITY)
Admission: RE | Admit: 2023-11-18 | Discharge: 2023-11-18 | Disposition: A | Discharge status: Home / Self Care | Payer: Medicare Other | Source: Ambulatory Visit | Attending: Hematology and Oncology | Admitting: Hematology and Oncology

## 2023-11-18 ENCOUNTER — Encounter (HOSPITAL_COMMUNITY): Payer: Self-pay

## 2023-11-18 ENCOUNTER — Ambulatory Visit (HOSPITAL_COMMUNITY)
Admission: RE | Admit: 2023-11-18 | Discharge: 2023-11-18 | Disposition: A | Discharge status: Home / Self Care | Payer: Medicare Other | Source: Ambulatory Visit | Attending: Hematology and Oncology

## 2023-11-18 DIAGNOSIS — Z853 Personal history of malignant neoplasm of breast: Secondary | ICD-10-CM

## 2023-11-18 DIAGNOSIS — N6325 Unspecified lump in the left breast, overlapping quadrants: Secondary | ICD-10-CM | POA: Diagnosis not present

## 2023-11-18 DIAGNOSIS — R92323 Mammographic fibroglandular density, bilateral breasts: Secondary | ICD-10-CM | POA: Diagnosis not present

## 2023-11-18 DIAGNOSIS — C50412 Malignant neoplasm of upper-outer quadrant of left female breast: Secondary | ICD-10-CM | POA: Diagnosis not present

## 2023-11-18 DIAGNOSIS — Z170 Estrogen receptor positive status [ER+]: Secondary | ICD-10-CM | POA: Diagnosis not present

## 2023-11-19 ENCOUNTER — Inpatient Hospital Stay: Payer: Medicare Other | Attending: Hematology and Oncology | Admitting: Hematology and Oncology

## 2023-11-19 VITALS — BP 117/62 | HR 58 | Temp 97.4°F | Resp 12 | Wt 159.7 lb

## 2023-11-19 DIAGNOSIS — Z79811 Long term (current) use of aromatase inhibitors: Secondary | ICD-10-CM | POA: Insufficient documentation

## 2023-11-19 DIAGNOSIS — Z170 Estrogen receptor positive status [ER+]: Secondary | ICD-10-CM | POA: Insufficient documentation

## 2023-11-19 DIAGNOSIS — C50412 Malignant neoplasm of upper-outer quadrant of left female breast: Secondary | ICD-10-CM | POA: Diagnosis not present

## 2023-11-19 DIAGNOSIS — C50411 Malignant neoplasm of upper-outer quadrant of right female breast: Secondary | ICD-10-CM | POA: Insufficient documentation

## 2023-11-19 MED ORDER — ANASTROZOLE 1 MG PO TABS: 1.0000 mg | ORAL_TABLET | Freq: Every day | ORAL | 3 refills | Status: AC

## 2023-11-19 NOTE — Assessment & Plan Note (Addendum)
Left lumpectomy 12/13/2015: Invasive ductal carcinoma 0.4 cm, DCIS 1.6 cm, clear margins, ER 100%, PR 100%, HER-2 negative ratio 1.22, Ki-67 5% T1 N0 stage IA 10/31/2020: Left breast biopsy for 0.9 cm mass at 2 o'clock position: Grade 1 IDC ER greater than 95%, PR greater than 95%, HER-2 2+ by IHC FISH negative, intramammary lymph node biopsy: Positive for cancer, left breast biopsy 3 o'clock position: Grade 1 IDC with DCIS   Tamoxifen started 12/21/2015 discontinued 03/20/2016 (for severe hot flashes, night sweats) Current treatment: Anastrozole Anastrozole toxicities: Tolerating it extremely well.   Breast Cancer Surveillance: 1. Breast exam 11/19/2023: Small palpable skin nodule the surgical scar in the left breast: Biopsy scar tissue 2. Mammogram: And ultrasound 11/18/2023 Stable (0.6 cm, 0.4 cm)  (originally it was 0.7 cm) She will now get yearly mammograms at [place]   Return to clinic in 1 year for follow-up

## 2023-11-19 NOTE — Progress Notes (Signed)
Patient Care Team: Carylon Perches, MD as PCP - General (Internal Medicine) Meriam Sprague, MD (Inactive) as PCP - Cardiology (Cardiology) Malissa Hippo, MD (Inactive) (Gastroenterology) Glenna Fellows, MD (Inactive) as Consulting Physician (General Surgery) Serena Croissant, MD as Consulting Physician (Hematology and Oncology) Chipper Herb, MD (Inactive) as Consulting Physician (Radiation Oncology) Salomon Fick, NP as Nurse Practitioner (Hematology and Oncology)  DIAGNOSIS:  Encounter Diagnosis  Name Primary?   Malignant neoplasm of upper-outer quadrant of left breast in female, estrogen receptor positive (HCC) Yes    SUMMARY OF ONCOLOGIC HISTORY: Oncology History  Breast cancer of upper-outer quadrant of left female breast (HCC)  11/02/2015 Mammogram   screening mammogram category B breast density, asymmetry with indistinct margin in the left breast upper outer quadrant posterior depth, 2 internal mammary lymph nodes, 6 mm at 2:00 position axillary negative   11/21/2015 Initial Diagnosis   left breast biopsy: Invasive ductal carcinoma with associated low-grade DCIS with microcalcifications focally involving a papilloma, ER/PR 100%, HER-2 negative ratio 1.22   12/13/2015 Surgery   Left lumpectomy: Invasive ductal carcinoma 0.4 cm, DCIS 1.6 cm, clear margins, ER 100%, PR 100%, HER-2 negative ratio 1.22, Ki-67 5% T1 N0 stage IA   12/21/2015 - 03/20/2016 Anti-estrogen oral therapy   Tamoxifen 20 mg daily decreased to 10 mg daily, unable to tolerate it.   10/31/2020 Relapse/Recurrence   Recurrent disease: Multifocal  0.9 cm, 0.3 cm and an intramammary lymph node all 3 biopsy-proven grade 1 IDC ER/PR positive HER-2 equivocal FISH pending, Ki-67 2%     CHIEF COMPLIANT: Follow-up of breast cancer on anastrozole therapy  HISTORY OF PRESENT ILLNESS:   History of Present Illness   The patient, with a history of breast cancer, presents for a routine follow-up. She  reports a recent mammogram and ultrasound, which showed no significant changes from the previous year. The patient is relieved, as she was initially concerned about the results. She has been taking anastrozole for her breast cancer, which she tolerates well after initially struggling with side effects on tamoxifen.  The patient also reports discontinuing gabapentin, which was prescribed for leg pain, as it was not providing any relief. She has been managing without it and has not noticed any worsening of her symptoms.  The patient is active, doing her own housework and going out almost every day. She has a support system, with a niece who visits regularly. She expresses contentment with her current lifestyle, despite missing her late partner, Reita Cliche.         ALLERGIES:  is allergic to brimonidine, codeine, morphine, and sulfa drugs cross reactors.  MEDICATIONS:  Current Outpatient Medications  Medication Sig Dispense Refill   acetaminophen (TYLENOL) 500 MG tablet Take 500 mg by mouth as needed for mild pain, moderate pain, fever or headache.     ALPRAZolam (XANAX) 0.5 MG tablet Take 0.25 mg by mouth 3 (three) times daily as needed for anxiety. Order is 0.05 tid prn     anastrozole (ARIMIDEX) 1 MG tablet Take 1 tablet (1 mg total) by mouth daily. 90 tablet 3   Cholecalciferol (VITAMIN D) 2000 UNITS tablet Take 2,000 Units by mouth daily.     ciprofloxacin (CIPRO) 250 MG tablet Take 250 mg by mouth 2 (two) times daily.     Cyanocobalamin (B-12) 1000 MCG TABS Take by mouth daily.      docusate sodium (COLACE) 250 MG capsule Take 250 mg by mouth daily.     dorzolamide-timolol (COSOPT) 22.3-6.8 MG/ML ophthalmic  solution 1 drop 2 (two) times daily.     ezetimibe-simvastatin (VYTORIN) 10-20 MG tablet Take 1 tablet by mouth daily. 90 tablet 2   famotidine (PEPCID) 20 MG tablet Take 1 tablet (20 mg total) by mouth at bedtime.     latanoprost (XALATAN) 0.005 % ophthalmic solution Place 1 drop into both  eyes.      metoprolol succinate (TOPROL-XL) 25 MG 24 hr tablet TAKE (1/2) TABLET BY MOUTH TWICE DAILY 90 tablet 2   nystatin-triamcinolone (MYCOLOG II) cream Apply 1 application topically 2 (two) times daily. 30 g 0   pantoprazole (PROTONIX) 40 MG tablet TAKE (1) TABLET BY MOUTH EACH MORNING. 90 tablet 4   pilocarpine (PILOCAR) 1 % ophthalmic solution Place 1 drop into the right eye 4 times daily.     Rivaroxaban (XARELTO) 15 MG TABS tablet Take 1 tablet (15 mg total) by mouth daily with supper. 90 tablet 1   No current facility-administered medications for this visit.    PHYSICAL EXAMINATION: ECOG PERFORMANCE STATUS: 1 - Symptomatic but completely ambulatory  Vitals:   11/19/23 1036  BP: 117/62  Pulse: (!) 58  Resp: 12  Temp: (!) 97.4 F (36.3 C)  SpO2: 100%   Filed Weights   11/19/23 1036  Weight: 159 lb 11.2 oz (72.4 kg)      LABORATORY DATA:  I have reviewed the data as listed    Latest Ref Rng & Units 07/23/2019   12:00 AM 11/26/2016   12:58 PM 12/12/2015   11:37 AM  CMP  Glucose 65 - 99 mg/dL  78  528   BUN 8 - 27 mg/dL  21  24   Creatinine 0.5 - 1.1 0.7     0.73  0.81   Sodium 134 - 144 mmol/L  141  142   Potassium 3.5 - 5.2 mmol/L  4.4  4.3   Chloride 96 - 106 mmol/L  103  109   CO2 18 - 29 mmol/L  25  28   Calcium 8.7 - 10.7 10.1     9.7  9.9   Alkaline Phos 25 - 125 74        AST 13 - 35 17        ALT 7 - 35 16           This result is from an external source.    Lab Results  Component Value Date   WBC 5.6 07/23/2019   HGB 13.7 07/23/2019   HCT 43 07/23/2019   MCV 89 11/26/2016   PLT 215 12/12/2015   NEUTROABS 3 07/23/2019    ASSESSMENT & PLAN:  Breast cancer of upper-outer quadrant of left female breast (HCC) Left lumpectomy 12/13/2015: Invasive ductal carcinoma 0.4 cm, DCIS 1.6 cm, clear margins, ER 100%, PR 100%, HER-2 negative ratio 1.22, Ki-67 5% T1 N0 stage IA 10/31/2020: Left breast biopsy for 0.9 cm mass at 2 o'clock position: Grade 1 IDC  ER greater than 95%, PR greater than 95%, HER-2 2+ by IHC FISH negative, intramammary lymph node biopsy: Positive for cancer, left breast biopsy 3 o'clock position: Grade 1 IDC with DCIS   Tamoxifen started 12/21/2015 discontinued 03/20/2016 (for severe hot flashes, night sweats) Current treatment: Anastrozole Anastrozole toxicities: Tolerating it extremely well.   Breast Cancer Surveillance: 1. Breast exam 11/19/2023: Small palpable skin nodule the surgical scar in the left breast: Biopsy scar tissue 2. Mammogram: And ultrasound 11/18/2023 Stable (0.6 cm, 0.4 cm)  (originally it was 0.7 cm) She will now  get yearly mammograms at [facility]   Return to clinic in 1 year for follow-up      No orders of the defined types were placed in this encounter.  The patient has a good understanding of the overall plan. she agrees with it. she will call with any problems that may develop before the next visit here. Total time spent: 30 mins including face to face time and time spent for planning, charting and co-ordination of care   Tamsen Meek, MD 11/19/23

## 2023-11-24 DIAGNOSIS — H401423 Capsular glaucoma with pseudoexfoliation of lens, left eye, severe stage: Secondary | ICD-10-CM | POA: Diagnosis not present

## 2023-11-24 DIAGNOSIS — H401411 Capsular glaucoma with pseudoexfoliation of lens, right eye, mild stage: Secondary | ICD-10-CM | POA: Diagnosis not present

## 2023-11-27 ENCOUNTER — Ambulatory Visit: Payer: Medicare Other | Attending: Student | Admitting: Student

## 2023-11-27 ENCOUNTER — Encounter: Payer: Self-pay | Admitting: Student

## 2023-11-27 VITALS — BP 136/80 | HR 67 | Ht 66.0 in | Wt 160.0 lb

## 2023-11-27 DIAGNOSIS — Z7901 Long term (current) use of anticoagulants: Secondary | ICD-10-CM

## 2023-11-27 DIAGNOSIS — I480 Paroxysmal atrial fibrillation: Secondary | ICD-10-CM | POA: Diagnosis not present

## 2023-11-27 DIAGNOSIS — E782 Mixed hyperlipidemia: Secondary | ICD-10-CM

## 2023-11-27 DIAGNOSIS — Z79899 Other long term (current) drug therapy: Secondary | ICD-10-CM

## 2023-11-27 NOTE — Progress Notes (Signed)
Cardiology Office Note    Date:  11/27/2023  ID:  Hailey Graham, DOB 12-05-1928, MRN [id number] Cardiologist: Previously Dr. Shari Prows and patient requests to follow-up with Dr. Tenny Craw in [place]  History of Present Illness:    Hailey Graham is a 89 y.o. female with past medical history of paroxysmal atrial fibrillation, HLD, history of breast cancer (s/p lumpectomy in 2016) and varicose veins who presents to the office today for 58-month follow-up.  She was examined by myself in 03/2023 and was having worsening sciatica at that time and had been on Gabapentin. She was overall doing well from a cardiac perspective and denied any recent palpitations. She was continued on Toprol-XL 12.5 mg twice daily for rate-control and Xarelto 15 mg daily for anticoagulation.  In talking the patient today, she reports overall doing well since her last office visit. She continues to live by herself and performs ADL's and grocery shopping independently. Drives locally but has family members or friends to take her to [place] if needed for appointments. She denies any recent dyspnea on exertion or chest pain. No recent palpitations, orthopnea or PND. She does experience intermittent lower extremity edema and has been intolerant to compression stockings due to difficulty putting these on. She remains on Xarelto for anticoagulation with no reports of melena, hematochezia or hematuria.   Studies Reviewed:   EKG: EKG is ordered today and demonstrates:   EKG Interpretation Date/Time:  Thursday November 27 2023 13:37:33 EST Ventricular Rate:  67 PR Interval:  114 QRS Duration:  134 QT Interval:  466 QTC Calculation: 492 R Axis:   73  Text Interpretation: Sinus rhythm with Premature atrial complexes Right bundle branch block Confirmed by Randall An ([id number]) on 11/27/2023 2:20:21 PM       Echocardiogram: 09/2019 IMPRESSIONS     1. Left ventricular ejection fraction, by visual estimation, is 60 to   65%. The left ventricle has normal function. There is moderately increased  left ventricular hypertrophy.   2. Left ventricular diastolic parameters are consistent with Grade I  diastolic dysfunction (impaired relaxation).   3. Global right ventricle has normal systolic function.The right  ventricular size is normal. No increase in right ventricular wall  thickness.   4. Left atrial size was normal.   5. Right atrial size was normal.   6. The mitral valve is grossly normal. No evidence of mitral valve  regurgitation.   7. The tricuspid valve is grossly normal. Tricuspid valve regurgitation  is trivial.   8. The aortic valve is tricuspid. Aortic valve regurgitation is mild.  Mild aortic valve sclerosis without stenosis.   9. The pulmonic valve was grossly normal. Pulmonic valve regurgitation is  not visualized.  10. The inferior vena cava is normal in size with greater than 50%  respiratory variability, suggesting right atrial pressure of 3 mmHg.    Risk Assessment/Calculations:   CHA2DS2-VASc Score = 3   This indicates a 3.2% annual risk of stroke. The patient's score is based upon: CHF History: 0 HTN History: 0 Diabetes History: 0 Stroke History: 0 Vascular Disease History: 0 Age Score: 2 Gender Score: 1    Physical Exam:   VS:  BP 136/80   Pulse 67   Ht 5\' 6"  (1.676 m)   Wt 160 lb (72.6 kg)   SpO2 98%   BMI 25.82 kg/m    Wt Readings from Last 3 Encounters:  11/27/23 160 lb (72.6 kg)  11/19/23 159 lb 11.2 oz (72.4 kg)  04/01/23  167 lb (75.8 kg)     GEN: Pleasant, elderly female appearing in no acute distress NECK: No JVD; No carotid bruits CARDIAC: RRR, no murmurs, rubs, gallops RESPIRATORY:  Clear to auscultation without rales, wheezing or rhonchi  ABDOMEN: Appears non-distended. No obvious abdominal masses. EXTREMITIES: No clubbing or cyanosis. No pitting edema.  Distal pedal pulses are 2+ bilaterally.   Assessment and Plan:   1. Paroxysmal Atrial  Fibrillation/Use of Long-term Anticoagulation - She denies any recent palpitations and is in normal sinus rhythm by EKG today with PAC's. Given her underlying conduction disease with RBBB, would not further titrate AV nodal blocking agents. Remains on Toprol-XL 12.5 mg twice daily. She is scheduled for follow-up labs with her PCP and requests that her Magnesium level be checked at that time and a lab slip for this was provided. - No reports of active bleeding. Remains on Xarelto 15 mg daily for anticoagulation. We will ask for copy of labs next week to be faxed to our office for review to make sure a follow-up CBC and BMET have been obtained. Creatinine was at 0.82 in 03/2023 by review of [facility] DXA which puts her CrCl at 49 mL/min.   2. HLD - Followed by her PCP. LDL was at 75 in 2023. She is scheduled for follow-up labs with her PCP for next week. Remains on Vytorin 10-20 mg daily.  Signed, Ellsworth Lennox, PA-C

## 2023-11-27 NOTE — Patient Instructions (Signed)
Medication Instructions:  Your physician recommends that you continue on your current medications as directed. Please refer to the Current Medication list given to you today.  *If you need a refill on your cardiac medications before your next appointment, please call your pharmacy*   Lab Work: Your physician recommends that you return for lab work at next lab draw.   If you have labs (blood work) drawn today and your tests are completely normal, you will receive your results only by: MyChart Message (if you have MyChart) OR A paper copy in the mail If you have any lab test that is abnormal or we need to change your treatment, we will call you to review the results.   Testing/Procedures: NONE    Follow-Up: At [facility], you and your health needs are our priority.  As part of our continuing mission to provide you with exceptional heart care, we have created designated Provider Care Teams.  These Care Teams include your primary Cardiologist (physician) and Advanced Practice Providers (APPs -  Physician Assistants and Nurse Practitioners) who all work together to provide you with the care you need, when you need it.  We recommend signing up for the patient portal called "MyChart".  Sign up information is provided on this After Visit Summary.  MyChart is used to connect with patients for Virtual Visits (Telemedicine).  Patients are able to view lab/test results, encounter notes, upcoming appointments, etc.  Non-urgent messages can be sent to your provider as well.   To learn more about what you can do with MyChart, go to [email or web address].    Your next appointment:   6 month(s)  Provider:   Dietrich Pates, MD or Randall An, PA-C    Other Instructions Thank you for choosing [facility]!

## 2023-12-04 DIAGNOSIS — I480 Paroxysmal atrial fibrillation: Secondary | ICD-10-CM | POA: Diagnosis not present

## 2023-12-04 DIAGNOSIS — Z79899 Other long term (current) drug therapy: Secondary | ICD-10-CM | POA: Diagnosis not present

## 2023-12-04 DIAGNOSIS — E210 Primary hyperparathyroidism: Secondary | ICD-10-CM | POA: Diagnosis not present

## 2023-12-05 LAB — MAGNESIUM: Magnesium: 2.2 mg/dL (ref 1.6–2.3)

## 2023-12-11 DIAGNOSIS — E785 Hyperlipidemia, unspecified: Secondary | ICD-10-CM | POA: Diagnosis not present

## 2023-12-11 DIAGNOSIS — I10 Essential (primary) hypertension: Secondary | ICD-10-CM | POA: Diagnosis not present

## 2023-12-11 DIAGNOSIS — E210 Primary hyperparathyroidism: Secondary | ICD-10-CM | POA: Diagnosis not present

## 2023-12-11 DIAGNOSIS — I480 Paroxysmal atrial fibrillation: Secondary | ICD-10-CM | POA: Diagnosis not present

## 2023-12-30 ENCOUNTER — Ambulatory Visit: Payer: Medicare Other | Admitting: Urology

## 2024-03-29 DIAGNOSIS — H401423 Capsular glaucoma with pseudoexfoliation of lens, left eye, severe stage: Secondary | ICD-10-CM | POA: Diagnosis not present

## 2024-03-29 DIAGNOSIS — H401411 Capsular glaucoma with pseudoexfoliation of lens, right eye, mild stage: Secondary | ICD-10-CM | POA: Diagnosis not present

## 2024-04-07 ENCOUNTER — Other Ambulatory Visit: Payer: Self-pay | Admitting: *Deleted

## 2024-04-07 DIAGNOSIS — I480 Paroxysmal atrial fibrillation: Secondary | ICD-10-CM

## 2024-04-07 MED ORDER — RIVAROXABAN 20 MG PO TABS: 20.0000 mg | ORAL_TABLET | Freq: Every day | ORAL | 1 refills | Status: DC

## 2024-04-07 NOTE — Telephone Encounter (Signed)
 Called pt to inform her of the xarelto dose change but there was no answer so left a message.

## 2024-04-07 NOTE — Telephone Encounter (Signed)
 Xarelto 15mg  refill request received from [facility]. Pt is 89 years old, weight-72.6kg, Crea-0.76 on 12/04/23 via [employer] tab from Dr. Velna Ghee office, last seen by Woodfin Hays on 11/27/23, Diagnosis-Afib, CrCl- 53 mL/min; Dose is inappropriate based on dosing criteria. Will send to pharmD to evaluate doseage.

## 2024-04-07 NOTE — Addendum Note (Signed)
 Addended by: Tyrus Gallus B on: 04/07/2024 05:04 PM   Modules accepted: Orders

## 2024-04-07 NOTE — Telephone Encounter (Signed)
 Spoke with the patient and advised that the xarelto dose was changing due to kidney function clearance. Explained what that meant and she advised I would send in the prescription at this time. She was thankful for the call and update.

## 2024-06-01 ENCOUNTER — Telehealth: Payer: Self-pay | Admitting: Internal Medicine

## 2024-06-01 ENCOUNTER — Telehealth: Payer: Self-pay

## 2024-06-01 DIAGNOSIS — I10 Essential (primary) hypertension: Secondary | ICD-10-CM | POA: Diagnosis not present

## 2024-06-01 DIAGNOSIS — D6869 Other thrombophilia: Secondary | ICD-10-CM | POA: Diagnosis not present

## 2024-06-01 DIAGNOSIS — I480 Paroxysmal atrial fibrillation: Secondary | ICD-10-CM | POA: Diagnosis not present

## 2024-06-01 NOTE — Telephone Encounter (Signed)
 Pt c/o medication issue:  1. Name of Medication:   rivaroxaban  (XARELTO ) 20 MG TABS tablet    2. How are you currently taking this medication (dosage and times per day)? Take 1 tablet (20 mg total) by mouth daily with supper.   3. Are you having a reaction (difficulty breathing--STAT)? No  4. What is your medication issue? Patient is calling because there was some confusion with her PCP and this medication. Patient is requesting to speak with RN Guillermina Leer. Please advise.

## 2024-06-01 NOTE — Telephone Encounter (Signed)
 Patient has been taking Xarelto  15 mg daily, recently received a Xarelto  20 mg prescription. Most recent GFR 73 on 12/05/23. Would be ok to take Xarelto  20 mg, she will contact Dr. Finis Hugger to confirm their preference.

## 2024-06-01 NOTE — Telephone Encounter (Signed)
   This was updated by our pharmacy team in 03/2024 as her creatinine clearance had been at 53 mL/min based on most recent labs and dosing is adjusted to 20 mg daily if creatinine clearance is greater than 50 mL/min. Xarelto  20 mg daily is the correct dose at this time.  This is a moving target though as it does change given her age, weight and renal function and we will continue to follow. She is on the correct dose at this time though.  Signed, Dorma Gash, PA-C 06/01/2024, 4:24 PM Pager: [phone]

## 2024-06-01 NOTE — Telephone Encounter (Signed)
 Pt states she was seen by her PCP Dr. Hildy Lowers and the question arose about her Xarelto . She was on 15 mg and now she is on 20 mg. Pt is unsure why medication dose has changed. Informed pt that in order to dose this medication that they look at her kidney function, age and weight. Pt asked that B. Strader review her chart to ensure she is on the correct dose.

## 2024-06-02 ENCOUNTER — Telehealth: Payer: Self-pay | Admitting: Adult Health

## 2024-06-02 MED ORDER — NYSTATIN-TRIAMCINOLONE 100000-0.1 UNIT/GM-% EX CREA: 1.0000 | TOPICAL_CREAM | Freq: Two times a day (BID) | CUTANEOUS | 2 refills | Status: DC

## 2024-06-02 NOTE — Telephone Encounter (Signed)
 Patient needs the Nystatin  Triamcinolone  Cream to [facility]. She asked if you could tell them to deliver & asked if you could add a refill. Please advise.

## 2024-06-02 NOTE — Addendum Note (Signed)
 Addended by: Lavon Bothwell A on: 06/02/2024 05:07 PM   Modules accepted: Orders

## 2024-06-02 NOTE — Telephone Encounter (Signed)
Refilled mycolog cream 

## 2024-06-02 NOTE — Telephone Encounter (Signed)
 B.Strader,PA-C message relayed to patient and she will continue Xarelto  20 mg daily.

## 2024-06-11 ENCOUNTER — Ambulatory Visit: Payer: Medicare Other | Admitting: Internal Medicine

## 2024-06-17 ENCOUNTER — Ambulatory Visit: Attending: Internal Medicine | Admitting: Internal Medicine

## 2024-06-17 ENCOUNTER — Encounter: Payer: Self-pay | Admitting: Internal Medicine

## 2024-06-17 VITALS — BP 150/75 | HR 64 | Ht 66.0 in | Wt 163.0 lb

## 2024-06-17 DIAGNOSIS — R609 Edema, unspecified: Secondary | ICD-10-CM

## 2024-06-17 DIAGNOSIS — I480 Paroxysmal atrial fibrillation: Secondary | ICD-10-CM

## 2024-06-17 DIAGNOSIS — R2681 Unsteadiness on feet: Secondary | ICD-10-CM | POA: Diagnosis not present

## 2024-06-17 DIAGNOSIS — E7849 Other hyperlipidemia: Secondary | ICD-10-CM

## 2024-06-17 DIAGNOSIS — Z79899 Other long term (current) drug therapy: Secondary | ICD-10-CM

## 2024-06-17 NOTE — Patient Instructions (Signed)
 Medication Instructions:  Your physician recommends that you continue on your current medications as directed. Please refer to the Current Medication list given to you today.  *If you need a refill on your cardiac medications before your next appointment, please call your pharmacy*  Lab Work: Your physician recommends that you return for lab work in: Today( CMET, TSH, BNP, CBC, A1C)   If you have labs (blood work) drawn today and your tests are completely normal, you will receive your results only by: MyChart Message (if you have MyChart) OR A paper copy in the mail If you have any lab test that is abnormal or we need to change your treatment, we will call you to review the results.  Testing/Procedures: NONE   Follow-Up: At [facility], you and your health needs are our priority.  As part of our continuing mission to provide you with exceptional heart care, our providers are all part of one team.  This team includes your primary Cardiologist (physician) and Advanced Practice Providers or APPs (Physician Assistants and Nurse Practitioners) who all work together to provide you with the care you need, when you need it.  Your next appointment:    November   Provider:   Vina Gull, MD    We recommend signing up for the patient portal called MyChart.  Sign up information is provided on this After Visit Summary.  MyChart is used to connect with patients for Virtual Visits (Telemedicine).  Patients are able to view lab/test results, encounter notes, upcoming appointments, etc.  Non-urgent messages can be sent to your provider as well.   To learn more about what you can do with MyChart, go to [email or web address].   Other Instructions Thank you for choosing [facility]!

## 2024-06-17 NOTE — Progress Notes (Signed)
 History of Present Illness:    Hailey Graham is a 89 y.o. female with past medical history of paroxysmal atrial fibrillation, HL, breast cancer (s/p lumpectomy in 2016) and varicose veins who presents for  follow-up. The pt was previously followed by GORMAN Rout and H Pemberton.  Last seen by B Strader     Since seen the pt  denies palpitations   Breathing is good  She notes rare SOB  No CP  No dizziness Her biggest problem is balance.  She uses a cane,rarely walker   For the past several weeks she has had LE edema   NO change in diet   Legs go down at night   Studies Reviewed:   EKG: EKG is not ordered today   EKG Interpretation Date/Time:    Ventricular Rate:    PR Interval:    QRS Duration:    QT Interval:    QTC Calculation:   R Axis:      Text Interpretation:         Echocardiogram: 09/2019 IMPRESSIONS     1. Left ventricular ejection fraction, by visual estimation, is 60 to  65%. The left ventricle has normal function. There is moderately increased  left ventricular hypertrophy.   2. Left ventricular diastolic parameters are consistent with Grade I  diastolic dysfunction (impaired relaxation).   3. Global right ventricle has normal systolic function.The right  ventricular size is normal. No increase in right ventricular wall  thickness.   4. Left atrial size was normal.   5. Right atrial size was normal.   6. The mitral valve is grossly normal. No evidence of mitral valve  regurgitation.   7. The tricuspid valve is grossly normal. Tricuspid valve regurgitation  is trivial.   8. The aortic valve is tricuspid. Aortic valve regurgitation is mild.  Mild aortic valve sclerosis without stenosis.   9. The pulmonic valve was grossly normal. Pulmonic valve regurgitation is  not visualized.  10. The inferior vena cava is normal in size with greater than 50%  respiratory variability, suggesting right atrial pressure of 3 mmHg.    Risk Assessment/Calculations:    CHA2DS2-VASc Score = 3   This indicates a 3.2% annual risk of stroke. The patient's score is based upon: CHF History: 0 HTN History: 0 Diabetes History: 0 Stroke History: 0 Vascular Disease History: 0 Age Score: 2 Gender Score: 1    Physical Exam:   VS:  BP (!) 150/75 (BP Location: Left Arm, Cuff Size: Large)   Pulse 64   Ht 5' 6 (1.676 m)   Wt 163 lb (73.9 kg)   SpO2 100%   BMI 26.31 kg/m    Wt Readings from Last 3 Encounters:  06/17/24 163 lb (73.9 kg)  11/27/23 160 lb (72.6 kg)  11/19/23 159 lb 11.2 oz (72.4 kg)     GEN: Pleasant, elderly female appearing in no acute distress NECK: No JVD;  CARDIAC: RRR, no murmurs RESPIRATORY:  Clear to auscultation ABDOMEN: Appears non-distended. No masses EXTREMITIES: Swollen, nonpitting edema  (R greater L)  Assessment and Plan:   1. Paroxysmal Atrial Fibrillation/Use of Long-term Anticoagulation  Pt asymptomatic    I would continue Toprol  XL and Xarelto   Follow periodic labs   2  LE edema    She has soft nonpitting edema of legs   Lungs CTA  Otherwise volume status looks OK Will check labs  Further Rx based on result     3. HL  Continue  Vytorin    4  Balance     Will refer back to PT for balance assessment/training   Signed, Vina Gull, MD

## 2024-06-18 ENCOUNTER — Telehealth: Payer: Self-pay | Admitting: Internal Medicine

## 2024-06-18 NOTE — Telephone Encounter (Signed)
 Pt labs reentered for [facility]. Pt notified

## 2024-06-18 NOTE — Addendum Note (Signed)
 Addended by: Samiha Denapoli G on: 06/18/2024 12:49 PM   Modules accepted: Orders

## 2024-06-18 NOTE — Telephone Encounter (Signed)
 Patient says she went to [facility] on [address] but they didn't have the orders available. She would like to make sure they have been released in the system. She requests a call back from [facility] specifically if possible.

## 2024-06-21 DIAGNOSIS — R600 Localized edema: Secondary | ICD-10-CM | POA: Diagnosis not present

## 2024-06-21 DIAGNOSIS — Z131 Encounter for screening for diabetes mellitus: Secondary | ICD-10-CM | POA: Diagnosis not present

## 2024-06-21 DIAGNOSIS — Z760 Encounter for issue of repeat prescription: Secondary | ICD-10-CM | POA: Diagnosis not present

## 2024-06-21 DIAGNOSIS — I480 Paroxysmal atrial fibrillation: Secondary | ICD-10-CM | POA: Diagnosis not present

## 2024-06-21 DIAGNOSIS — R609 Edema, unspecified: Secondary | ICD-10-CM | POA: Diagnosis not present

## 2024-06-21 DIAGNOSIS — E7849 Other hyperlipidemia: Secondary | ICD-10-CM | POA: Diagnosis not present

## 2024-06-21 DIAGNOSIS — Z79899 Other long term (current) drug therapy: Secondary | ICD-10-CM | POA: Diagnosis not present

## 2024-06-22 ENCOUNTER — Encounter (HOSPITAL_COMMUNITY): Payer: Self-pay | Admitting: Emergency Medicine

## 2024-06-22 ENCOUNTER — Telehealth: Payer: Self-pay | Admitting: Internal Medicine

## 2024-06-22 ENCOUNTER — Ambulatory Visit: Payer: Self-pay | Admitting: Internal Medicine

## 2024-06-22 ENCOUNTER — Other Ambulatory Visit: Payer: Self-pay

## 2024-06-22 ENCOUNTER — Inpatient Hospital Stay (HOSPITAL_COMMUNITY)
Admission: EM | Admit: 2024-06-22 | Discharge: 2024-07-02 | DRG: 329 | Disposition: A | Discharge status: Skilled Nursing Facility | Attending: Internal Medicine | Admitting: Internal Medicine

## 2024-06-22 DIAGNOSIS — K254 Chronic or unspecified gastric ulcer with hemorrhage: Secondary | ICD-10-CM | POA: Diagnosis not present

## 2024-06-22 DIAGNOSIS — K639 Disease of intestine, unspecified: Secondary | ICD-10-CM | POA: Diagnosis not present

## 2024-06-22 DIAGNOSIS — J9811 Atelectasis: Secondary | ICD-10-CM | POA: Diagnosis not present

## 2024-06-22 DIAGNOSIS — I480 Paroxysmal atrial fibrillation: Secondary | ICD-10-CM | POA: Diagnosis not present

## 2024-06-22 DIAGNOSIS — K6389 Other specified diseases of intestine: Secondary | ICD-10-CM | POA: Diagnosis not present

## 2024-06-22 DIAGNOSIS — Z7901 Long term (current) use of anticoagulants: Secondary | ICD-10-CM | POA: Diagnosis not present

## 2024-06-22 DIAGNOSIS — D72829 Elevated white blood cell count, unspecified: Secondary | ICD-10-CM | POA: Diagnosis not present

## 2024-06-22 DIAGNOSIS — Z66 Do not resuscitate: Secondary | ICD-10-CM | POA: Diagnosis present

## 2024-06-22 DIAGNOSIS — D649 Anemia, unspecified: Secondary | ICD-10-CM | POA: Diagnosis not present

## 2024-06-22 DIAGNOSIS — E872 Acidosis, unspecified: Secondary | ICD-10-CM | POA: Diagnosis not present

## 2024-06-22 DIAGNOSIS — Z79811 Long term (current) use of aromatase inhibitors: Secondary | ICD-10-CM

## 2024-06-22 DIAGNOSIS — K449 Diaphragmatic hernia without obstruction or gangrene: Secondary | ICD-10-CM | POA: Diagnosis not present

## 2024-06-22 DIAGNOSIS — E785 Hyperlipidemia, unspecified: Secondary | ICD-10-CM | POA: Diagnosis not present

## 2024-06-22 DIAGNOSIS — K5731 Diverticulosis of large intestine without perforation or abscess with bleeding: Secondary | ICD-10-CM | POA: Diagnosis not present

## 2024-06-22 DIAGNOSIS — F418 Other specified anxiety disorders: Secondary | ICD-10-CM | POA: Diagnosis not present

## 2024-06-22 DIAGNOSIS — Q438 Other specified congenital malformations of intestine: Secondary | ICD-10-CM

## 2024-06-22 DIAGNOSIS — D500 Iron deficiency anemia secondary to blood loss (chronic): Secondary | ICD-10-CM | POA: Diagnosis not present

## 2024-06-22 DIAGNOSIS — D62 Acute posthemorrhagic anemia: Secondary | ICD-10-CM | POA: Diagnosis not present

## 2024-06-22 DIAGNOSIS — I700 Atherosclerosis of aorta: Secondary | ICD-10-CM | POA: Diagnosis not present

## 2024-06-22 DIAGNOSIS — K3189 Other diseases of stomach and duodenum: Secondary | ICD-10-CM | POA: Diagnosis not present

## 2024-06-22 DIAGNOSIS — F32A Depression, unspecified: Secondary | ICD-10-CM | POA: Diagnosis present

## 2024-06-22 DIAGNOSIS — K769 Liver disease, unspecified: Secondary | ICD-10-CM | POA: Diagnosis not present

## 2024-06-22 DIAGNOSIS — K922 Gastrointestinal hemorrhage, unspecified: Principal | ICD-10-CM | POA: Diagnosis present

## 2024-06-22 DIAGNOSIS — R160 Hepatomegaly, not elsewhere classified: Secondary | ICD-10-CM | POA: Diagnosis not present

## 2024-06-22 DIAGNOSIS — C180 Malignant neoplasm of cecum: Secondary | ICD-10-CM | POA: Diagnosis not present

## 2024-06-22 DIAGNOSIS — K5730 Diverticulosis of large intestine without perforation or abscess without bleeding: Secondary | ICD-10-CM | POA: Diagnosis not present

## 2024-06-22 DIAGNOSIS — D120 Benign neoplasm of cecum: Secondary | ICD-10-CM | POA: Diagnosis not present

## 2024-06-22 DIAGNOSIS — K269 Duodenal ulcer, unspecified as acute or chronic, without hemorrhage or perforation: Secondary | ICD-10-CM | POA: Diagnosis not present

## 2024-06-22 DIAGNOSIS — K635 Polyp of colon: Secondary | ICD-10-CM | POA: Diagnosis not present

## 2024-06-22 DIAGNOSIS — K8020 Calculus of gallbladder without cholecystitis without obstruction: Secondary | ICD-10-CM | POA: Diagnosis not present

## 2024-06-22 DIAGNOSIS — R195 Other fecal abnormalities: Secondary | ICD-10-CM | POA: Diagnosis not present

## 2024-06-22 DIAGNOSIS — D509 Iron deficiency anemia, unspecified: Secondary | ICD-10-CM | POA: Diagnosis not present

## 2024-06-22 DIAGNOSIS — R6889 Other general symptoms and signs: Secondary | ICD-10-CM | POA: Diagnosis not present

## 2024-06-22 DIAGNOSIS — E876 Hypokalemia: Secondary | ICD-10-CM | POA: Diagnosis present

## 2024-06-22 DIAGNOSIS — M7989 Other specified soft tissue disorders: Secondary | ICD-10-CM | POA: Diagnosis not present

## 2024-06-22 DIAGNOSIS — R7889 Finding of other specified substances, not normally found in blood: Secondary | ICD-10-CM | POA: Diagnosis not present

## 2024-06-22 DIAGNOSIS — M170 Bilateral primary osteoarthritis of knee: Secondary | ICD-10-CM | POA: Diagnosis present

## 2024-06-22 DIAGNOSIS — I10 Essential (primary) hypertension: Secondary | ICD-10-CM | POA: Diagnosis not present

## 2024-06-22 DIAGNOSIS — Z853 Personal history of malignant neoplasm of breast: Secondary | ICD-10-CM

## 2024-06-22 DIAGNOSIS — F419 Anxiety disorder, unspecified: Secondary | ICD-10-CM | POA: Diagnosis present

## 2024-06-22 DIAGNOSIS — M25561 Pain in right knee: Secondary | ICD-10-CM | POA: Diagnosis not present

## 2024-06-22 DIAGNOSIS — M1711 Unilateral primary osteoarthritis, right knee: Secondary | ICD-10-CM | POA: Diagnosis not present

## 2024-06-22 DIAGNOSIS — C19 Malignant neoplasm of rectosigmoid junction: Secondary | ICD-10-CM | POA: Diagnosis not present

## 2024-06-22 DIAGNOSIS — Z743 Need for continuous supervision: Secondary | ICD-10-CM | POA: Diagnosis not present

## 2024-06-22 DIAGNOSIS — D123 Benign neoplasm of transverse colon: Secondary | ICD-10-CM | POA: Diagnosis not present

## 2024-06-22 LAB — CBC
HCT: 20.5 % — ABNORMAL LOW (ref 36.0–46.0)
Hematocrit: 23.3 % — ABNORMAL LOW (ref 34.0–46.6)
Hemoglobin: 6.3 g/dL — CL (ref 11.1–15.9)
Hemoglobin: 5.6 g/dL — CL (ref 12.0–15.0)
MCH: 19.8 pg — ABNORMAL LOW (ref 26.6–33.0)
MCH: 19.7 pg — ABNORMAL LOW (ref 26.0–34.0)
MCHC: 27 g/dL — ABNORMAL LOW (ref 31.5–35.7)
MCHC: 27.3 g/dL — ABNORMAL LOW (ref 30.0–36.0)
MCV: 73 fL — ABNORMAL LOW (ref 79–97)
MCV: 72.2 fL — ABNORMAL LOW (ref 80.0–100.0)
Platelets: 325 10*3/uL (ref 150–450)
Platelets: 247 10*3/uL (ref 150–400)
RBC: 3.18 x10E6/uL — ABNORMAL LOW (ref 3.77–5.28)
RBC: 2.84 MIL/uL — ABNORMAL LOW (ref 3.87–5.11)
RDW: 16.7 % — ABNORMAL HIGH (ref 11.7–15.4)
RDW: 18.6 % — ABNORMAL HIGH (ref 11.5–15.5)
WBC: 7.3 10*3/uL (ref 3.4–10.8)
WBC: 4.9 10*3/uL (ref 4.0–10.5)
nRBC: 0 % (ref 0.0–0.2)

## 2024-06-22 LAB — COMPREHENSIVE METABOLIC PANEL WITH GFR
ALT: 10 IU/L (ref 0–32)
AST: 19 IU/L (ref 0–40)
Albumin: 3.6 g/dL (ref 3.6–4.6)
Alkaline Phosphatase: 113 IU/L (ref 44–121)
BUN/Creatinine Ratio: 28 (ref 12–28)
BUN: 19 mg/dL (ref 10–36)
Bilirubin Total: 0.3 mg/dL (ref 0.0–1.2)
CO2: 19 mmol/L — ABNORMAL LOW (ref 20–29)
Calcium: 9.7 mg/dL (ref 8.7–10.3)
Chloride: 110 mmol/L — ABNORMAL HIGH (ref 96–106)
Creatinine, Ser: 0.67 mg/dL (ref 0.57–1.00)
Globulin, Total: 2.4 g/dL (ref 1.5–4.5)
Glucose: 95 mg/dL (ref 70–99)
Potassium: 3.9 mmol/L (ref 3.5–5.2)
Sodium: 142 mmol/L (ref 134–144)
Total Protein: 6 g/dL (ref 6.0–8.5)
eGFR: 81 mL/min/{1.73_m2} (ref >59)

## 2024-06-22 LAB — BASIC METABOLIC PANEL WITH GFR
Anion gap: 8 (ref 5–15)
BUN: 21 mg/dL (ref 8–23)
CO2: 21 mmol/L — ABNORMAL LOW (ref 22–32)
Calcium: 9.5 mg/dL (ref 8.9–10.3)
Chloride: 110 mmol/L (ref 98–111)
Creatinine, Ser: 0.74 mg/dL (ref 0.44–1.00)
GFR, Estimated: >60 mL/min (ref >60)
Glucose, Bld: 99 mg/dL (ref 70–99)
Potassium: 3.6 mmol/L (ref 3.5–5.1)
Sodium: 139 mmol/L (ref 135–145)

## 2024-06-22 LAB — POC OCCULT BLOOD, ED: Fecal Occult Bld: POSITIVE — AB

## 2024-06-22 LAB — ABO/RH: ABO/RH(D): A POS

## 2024-06-22 LAB — PREPARE RBC (CROSSMATCH)

## 2024-06-22 LAB — BRAIN NATRIURETIC PEPTIDE: BNP: 172 pg/mL — ABNORMAL HIGH (ref 0.0–100.0)

## 2024-06-22 LAB — HEMOGLOBIN A1C
Est. average glucose Bld gHb Est-mCnc: 117 mg/dL
Hgb A1c MFr Bld: 5.7 % — ABNORMAL HIGH (ref 4.8–5.6)

## 2024-06-22 LAB — TSH: TSH: 2.19 u[IU]/mL (ref 0.450–4.500)

## 2024-06-22 MED ORDER — EZETIMIBE-SIMVASTATIN 10-20 MG PO TABS: 1.0000 | ORAL_TABLET | Freq: Every day | ORAL | Status: DC

## 2024-06-22 MED ORDER — PANTOPRAZOLE SODIUM 40 MG IV SOLR
40.0000 mg | Freq: Two times a day (BID) | INTRAVENOUS | Status: DC
  Administered 2024-06-22 – 2024-07-01 (×17): 40 mg via INTRAVENOUS
  Filled 2024-06-22 (×18): qty 10

## 2024-06-22 MED ORDER — PANTOPRAZOLE SODIUM 40 MG IV SOLR
40.0000 mg | Freq: Once | INTRAVENOUS | Status: AC
  Administered 2024-06-22: 40 mg via INTRAVENOUS
  Filled 2024-06-22: qty 10

## 2024-06-22 MED ORDER — EZETIMIBE 10 MG PO TABS
10.0000 mg | ORAL_TABLET | Freq: Every day | ORAL | Status: DC
  Administered 2024-06-22 – 2024-07-02 (×10): 10 mg via ORAL
  Filled 2024-06-22 (×11): qty 1

## 2024-06-22 MED ORDER — PILOCARPINE HCL 1 % OP SOLN
1.0000 [drp] | Freq: Four times a day (QID) | OPHTHALMIC | Status: DC
  Administered 2024-06-23 – 2024-07-02 (×33): 1 [drp] via OPHTHALMIC
  Filled 2024-06-22: qty 15

## 2024-06-22 MED ORDER — ONDANSETRON HCL 4 MG PO TABS
4.0000 mg | ORAL_TABLET | Freq: Four times a day (QID) | ORAL | Status: DC | PRN
Start: 2024-06-22 — End: 2024-07-02
  Administered 2024-06-27 – 2024-06-30 (×2): 4 mg via ORAL
  Filled 2024-06-22 (×3): qty 1

## 2024-06-22 MED ORDER — SODIUM CHLORIDE 0.9% IV SOLUTION: Freq: Once | INTRAVENOUS | Status: AC

## 2024-06-22 MED ORDER — ALPRAZOLAM 0.25 MG PO TABS
0.2500 mg | ORAL_TABLET | Freq: Every day | ORAL | Status: DC | PRN
  Administered 2024-06-29 – 2024-06-30 (×2): 0.25 mg via ORAL
  Filled 2024-06-22 (×2): qty 1

## 2024-06-22 MED ORDER — METOPROLOL SUCCINATE ER 25 MG PO TB24
25.0000 mg | ORAL_TABLET | Freq: Every day | ORAL | Status: DC
  Administered 2024-06-23: 25 mg via ORAL
  Filled 2024-06-22: qty 1

## 2024-06-22 MED ORDER — LATANOPROST 0.005 % OP SOLN: 1.0000 [drp] | Freq: Every day | OPHTHALMIC | Status: DC

## 2024-06-22 MED ORDER — ONDANSETRON HCL 4 MG/2ML IJ SOLN
4.0000 mg | Freq: Four times a day (QID) | INTRAMUSCULAR | Status: DC | PRN
Start: 2024-06-22 — End: 2024-07-02
  Administered 2024-06-23 – 2024-06-29 (×6): 4 mg via INTRAVENOUS
  Filled 2024-06-22 (×5): qty 2

## 2024-06-22 MED ORDER — ACETAMINOPHEN 500 MG PO TABS
500.0000 mg | ORAL_TABLET | ORAL | Status: DC | PRN
  Administered 2024-06-23 – 2024-06-25 (×3): 500 mg via ORAL
  Filled 2024-06-22 (×3): qty 1

## 2024-06-22 MED ORDER — SODIUM CHLORIDE 0.9% FLUSH
3.0000 mL | Freq: Two times a day (BID) | INTRAVENOUS | Status: DC
  Administered 2024-06-22 – 2024-07-02 (×19): 3 mL via INTRAVENOUS

## 2024-06-22 MED ORDER — DORZOLAMIDE HCL-TIMOLOL MAL 2-0.5 % OP SOLN
1.0000 [drp] | Freq: Two times a day (BID) | OPHTHALMIC | Status: DC
  Administered 2024-06-23 – 2024-07-02 (×17): 1 [drp] via OPHTHALMIC
  Filled 2024-06-22 (×3): qty 10

## 2024-06-22 MED ORDER — SIMVASTATIN 20 MG PO TABS
20.0000 mg | ORAL_TABLET | Freq: Every day | ORAL | Status: DC
  Administered 2024-06-22 – 2024-07-01 (×10): 20 mg via ORAL
  Filled 2024-06-22 (×10): qty 1

## 2024-06-22 NOTE — ED Provider Notes (Addendum)
 [facility] DEPART[facility] AT [facility] Provider Note   CSN: [id number] Arrival date & time: 06/22/24  1319     Patient presents with: Anemia   Hailey Graham is a 89 y.o. female.   Patient has a history of atrial flutter.  She takes Xarelto  at night.  She was seen by her doctor recently and they checked her hemoglobin and it was low at around 6.  She was sent to the emergency department for further evaluation  The history is provided by the patient and medical records. No language interpreter was used.  Weakness Severity:  Mild Onset quality:  Sudden Timing:  Constant Progression:  Waxing and waning Chronicity:  New Context: not alcohol use   Relieved by:  Nothing Worsened by:  Nothing Associated symptoms: no abdominal pain, no chest pain, no cough, no diarrhea, no frequency, no headaches and no seizures        Prior to Admission medications   Medication Sig Start Date End Date Taking? Authorizing Provider  acetaminophen (TYLENOL) 500 MG tablet Take 500 mg by mouth as needed for mild pain, moderate pain, fever or headache.    [provider]  ALPRAZolam (XANAX) 0.5 MG tablet Take 0.25 mg by mouth 3 (three) times daily as needed for anxiety. Order is 0.05 tid prn    [provider]  anastrozole  (ARIMIDEX ) 1 MG tablet Take 1 tablet (1 mg total) by mouth daily. 11/19/23   Gudena, Vinay, MD  Cholecalciferol (VITAMIN D ) 2000 UNITS tablet Take 2,000 Units by mouth daily.    [provider]  ciprofloxacin (CIPRO) 250 MG tablet Take 250 mg by mouth 2 (two) times daily. Patient not taking: Reported on 06/17/2024 07/17/20   [provider]  Cyanocobalamin (B-12) 1000 MCG TABS Take by mouth daily.     [provider]  docusate sodium (COLACE) 250 MG capsule Take 250 mg by mouth daily.    [provider]  dorzolamide-timolol (COSOPT) 22.3-6.8 MG/ML ophthalmic solution 1 drop 2 (two) times daily. 05/24/20   [provider]  ezetimibe -simvastatin  (VYTORIN ) 10-20 MG tablet Take 1 tablet by mouth daily. 10/20/23   Strader, Laymon HERO, PA-C  famotidine  (PEPCID ) 20 MG tablet Take 1 tablet (20 mg total) by mouth at bedtime. 07/13/19   Rehman, Claudis PENNER, MD  latanoprost (XALATAN) 0.005 % ophthalmic solution Place 1 drop into both eyes.  03/20/20   [provider]  metoprolol  succinate (TOPROL -XL) 25 MG 24 hr tablet TAKE (1/2) TABLET BY MOUTH TWICE DAILY 10/31/23   Strader, Brittany M, PA-C  nystatin -triamcinolone  (MYCOLOG II) cream Apply 1 Application topically 2 (two) times daily. As needed to external tissue 06/02/24   Signa Delon LABOR, NP  pantoprazole  (PROTONIX ) 40 MG tablet TAKE (1) TABLET BY MOUTH EACH MORNING. 11/27/20   Eartha Flavors, Toribio, MD  pilocarpine (PILOCAR) 1 % ophthalmic solution Place 1 drop into the right eye 4 times daily. 06/11/21   [provider]    Allergies: Brimonidine, Codeine, Morphine, and Sulfa drugs cross reactors    Review of Systems  Constitutional:  Negative for appetite change and fatigue.  HENT:  Negative for congestion, ear discharge and sinus pressure.   Eyes:  Negative for discharge.  Respiratory:  Negative for cough.   Cardiovascular:  Negative for chest pain.  Gastrointestinal:  Negative for abdominal pain and diarrhea.  Genitourinary:  Negative for frequency and hematuria.  Musculoskeletal:  Negative for back pain.  Skin:  Negative for rash.  Neurological:  Positive for weakness. Negative for seizures and headaches.  Psychiatric/Behavioral:  Negative for hallucinations.     Updated Vital Signs BP (!) 117/46   Pulse 62   Temp 98.2 F (36.8 C) (Oral)   Resp 14   SpO2 96%   Physical Exam Vitals and nursing note reviewed.  Constitutional:      Appearance: She is well-developed.  HENT:     Head: Normocephalic.     Nose: Nose normal.   Eyes:     General: No scleral icterus.    Conjunctiva/sclera: Conjunctivae normal.   Neck:      Thyroid : No thyromegaly.   Cardiovascular:     Rate and Rhythm: Normal rate and regular rhythm.     Heart sounds: No murmur heard.    No friction rub. No gallop.  Pulmonary:     Breath sounds: No stridor. No wheezing or rales.  Chest:     Chest wall: No tenderness.  Abdominal:     General: There is no distension.     Tenderness: There is no abdominal tenderness. There is no rebound.  Genitourinary:    Comments: Rectal exam shows brown stool is heme positive  Musculoskeletal:        General: Normal range of motion.     Cervical back: Neck supple.  Lymphadenopathy:     Cervical: No cervical adenopathy.   Skin:    Findings: No erythema or rash.   Neurological:     Mental Status: She is alert and oriented to person, place, and time.     Motor: No abnormal muscle tone.     Coordination: Coordination normal.   Psychiatric:        Behavior: Behavior normal.     (all labs ordered are listed, but only abnormal results are displayed) Labs Reviewed  CBC - Abnormal; Notable for the following components:      Result Value   RBC 2.84 (*)    Hemoglobin 5.6 (*)    HCT 20.5 (*)    MCV 72.2 (*)    MCH 19.7 (*)    MCHC 27.3 (*)    RDW 18.6 (*)    All other components within normal limits  POC OCCULT BLOOD, ED - Abnormal; Notable for the following components:   Fecal Occult Bld POSITIVE (*)    All other components within normal limits  TYPE AND SCREEN  ABO/RH  PREPARE RBC (CROSSMATCH)    EKG: EKG Interpretation Date/Time:  Tuesday June 22 2024 13:40:44 EDT Ventricular Rate:  66 PR Interval:  184 QRS Duration:  146 QT Interval:  477 QTC Calculation: 500 R Axis:   64  Text Interpretation: Sinus rhythm Atrial premature complexes Right bundle branch block Non-specific ST-t changes Confirmed by Bernard Drivers ([id number]) on 06/22/2024 1:58:24 PM  Radiology: No results found.   Procedures   Medications Ordered in the ED  0.9 %  sodium chloride  infusion (Manually program  via Guardrails IV Fluids) (has no administration in time range)  pantoprazole  (PROTONIX ) injection 40 mg (40 mg Intravenous Given 06/22/24 1538)   CRITICAL CARE Performed by: Fairy Sermon Total critical care time: 45 minutes Critical care time was exclusive of separately billable procedures and treating other patients. Critical care was necessary to treat or prevent imminent or life-threatening deterioration. Critical care was time spent personally by me on the following activities: development of treatment plan with patient and/or surrogate as well as nursing, discussions with consultants, evaluation of patient's response to treatment, examination of patient, obtaining history  from patient or surrogate, ordering and performing treatments and interventions, ordering and review of laboratory studies, ordering and review of radiographic studies, pulse oximetry and re-evaluation of patient's condition.     Patient with anemia secondary to GI bleed.  She is hemodynamically stable.  I spoke with Dr. Eartha and he recommended Protonix  twice a day and n.p.o. after midnight and we will transfuse her 2 units of packed red blood cells                                 Medical Decision Making Amount and/or Complexity of Data Reviewed Labs: ordered. ECG/medicine tests: ordered.  Risk Prescription drug management. Decision regarding hospitalization.   Anemia secondary to Xarelto  and GI bleed.  Patient will be admitted to medicine with GI consult     Final diagnoses:  None    ED Discharge Orders     None          Suzette Pac, MD 06/22/24 1553    Suzette Pac, MD 06/22/24 1557

## 2024-06-22 NOTE — H&P (Signed)
 History and Physical    Hailey Graham FMW:[id number] DOB: 1928-12-10 DOA: 06/22/2024  PCP: Sheryle Carwin, MD  Patient coming from: PCPs office  I have personally briefly reviewed patient's old medical records in [facility]  Chief Complaint: Anemia  HPI: Hailey Graham is a 89 y.o. female with medical history significant of paroxysmal atrial fibrillation on Xarelto  and no other medical history who saw her cardiologist last Thursday for routine checkup, she was supposed to go for the lab check next day Friday but some reason order was not sent to the laboratory.  She went yesterday, blood was drawn, however CBC resulted today with hemoglobin under 7 so she was called by her cardiologist Dr. Okey to present to the ED.  Patient herself has no complaints at all.  She denies any nausea, vomiting, chest pain, shortness of breath, palpitation, any problem with urination or bowel bowel movements, no hematochezia, hematemesis or melena.  ED Course: Upon arrival to ED, patient fairly stable hemodynamically.  CBC showed hemoglobin of 5.6.  FOBT positive.  BMP not drawn.  2 units of PRBC transfusion ordered.  ED physician consulted GI, they recommended admission.  Hospitalist called for admission.  Review of Systems: As per HPI otherwise negative.    Past Medical History:  Diagnosis Date   Allergy    Anxiety    Arthritis    Breast cancer of upper-outer quadrant of left female breast (HCC) 11/24/2015   Cataract    Complication of anesthesia    Depression    Diverticulosis    Gait abnormality    Gastroesophageal reflux disease    Glaucoma    Hyperlipidemia    Lipid profile in 05/2010:149, 86, 48, 84   Lung nodule    Right lower lobe; stable in 2010   Paroxysmal atrial fibrillation (HCC)    Rate related right bundle branch block; normal EF on echo in 2006; normal stress nuclear-2006; mild RV hypokinesis on MRI;  RA and RV enlargement on CT in 9/06; Anticoagulation->discontinued in 2009    Personal history of kidney stones    PONV (postoperative nausea and vomiting)    Nausea    Past Surgical History:  Procedure Laterality Date   ABDOMINAL HYSTERECTOMY  2202   ANKLE SURGERY Right 12/23/2001   Fracture- rod   APPENDECTOMY     as a teenager   BREAST LUMPECTOMY Left 2016   invasive ductal ca   BREAST LUMPECTOMY WITH RADIOACTIVE SEED LOCALIZATION Left 12/13/2015   Procedure: BREAST LUMPECTOMY WITH RADIOACTIVE SEED LOCALIZATION;  Surgeon: Morene Olives, MD;  Location: [facility] OR;  Service: General;  Laterality: Left;   CATARACT EXTRACTION W/ INTRAOCULAR LENS IMPLANT Right 12/23/2012   COLONOSCOPY W/ POLYPECTOMY     ESOPHAGOGASTRODUODENOSCOPY     GLAUCOMA SURGERY Right 12/23/2012   HERNIA REPAIR Right    1990's   ORIF ANKLE FRACTURE  12/23/2001   Right   TONSILLECTOMY     age 40   Varicose veins Right 2207     reports that she has never smoked. She has never used smokeless tobacco. She reports that she does not drink alcohol and does not use drugs.  Allergies  Allergen Reactions   Brimonidine Other (See Comments)    Crusting - difficulty opening eyes   Sulfa Drugs Cross Reactors Other (See Comments)    Unknown    Codeine Nausea Only   Morphine Nausea Only   Sulfamethoxazole Nausea Only    Family History  Problem Relation Age of Onset  Healthy Mother    Cerebral aneurysm Father    Lung cancer Sister     Prior to Admission medications   Medication Sig Start Date End Date Taking? Authorizing Provider  acetaminophen (TYLENOL) 500 MG tablet Take 500 mg by mouth as needed for mild pain, moderate pain, fever or headache.    [provider]  ALPRAZolam (XANAX) 0.5 MG tablet Take 0.25 mg by mouth 3 (three) times daily as needed for anxiety. Order is 0.05 tid prn    [provider]  anastrozole  (ARIMIDEX ) 1 MG tablet Take 1 tablet (1 mg total) by mouth daily. 11/19/23   Gudena, Vinay, MD  Cholecalciferol (VITAMIN D ) 2000 UNITS tablet Take 2,000  Units by mouth daily.    [provider]  ciprofloxacin (CIPRO) 250 MG tablet Take 250 mg by mouth 2 (two) times daily. Patient not taking: Reported on 06/17/2024 07/17/20   [provider]  Cyanocobalamin (B-12) 1000 MCG TABS Take by mouth daily.     [provider]  docusate sodium (COLACE) 250 MG capsule Take 250 mg by mouth daily.    [provider]  dorzolamide-timolol (COSOPT) 22.3-6.8 MG/ML ophthalmic solution 1 drop 2 (two) times daily. 05/24/20   [provider]  ezetimibe -simvastatin  (VYTORIN ) 10-20 MG tablet Take 1 tablet by mouth daily. 10/20/23   Strader, Laymon HERO, PA-C  famotidine  (PEPCID ) 20 MG tablet Take 1 tablet (20 mg total) by mouth at bedtime. 07/13/19   Rehman, Claudis PENNER, MD  latanoprost (XALATAN) 0.005 % ophthalmic solution Place 1 drop into both eyes.  03/20/20   [provider]  metoprolol  succinate (TOPROL -XL) 25 MG 24 hr tablet TAKE (1/2) TABLET BY MOUTH TWICE DAILY 10/31/23   Strader, Brittany M, PA-C  nystatin -triamcinolone  (MYCOLOG II) cream Apply 1 Application topically 2 (two) times daily. As needed to external tissue 06/02/24   Signa Delon LABOR, NP  pantoprazole  (PROTONIX ) 40 MG tablet TAKE (1) TABLET BY MOUTH EACH MORNING. 11/27/20   Eartha Flavors, Toribio, MD  pilocarpine (PILOCAR) 1 % ophthalmic solution Place 1 drop into the right eye 4 times daily. 06/11/21   [provider]    Physical Exam: Vitals:   06/22/24 1605 06/22/24 1625 06/22/24 1700 06/22/24 1732  BP: (!) 146/74 131/61 131/62 (!) 155/88  Pulse: 65 69 65 68  Resp:      Temp: 97.8 F (36.6 C) 97.7 F (36.5 C)  98 F (36.7 C)  TempSrc: Oral Oral  Oral  SpO2: 98% 98% 97% 100%  Weight:    74 kg    Constitutional: NAD, calm, comfortable Vitals:   06/22/24 1605 06/22/24 1625 06/22/24 1700 06/22/24 1732  BP: (!) 146/74 131/61 131/62 (!) 155/88  Pulse: 65 69 65 68  Resp:      Temp: 97.8 F (36.6 C) 97.7 F (36.5 C)  98 F (36.7  C)  TempSrc: Oral Oral  Oral  SpO2: 98% 98% 97% 100%  Weight:    74 kg   Eyes: PERRL, lids and conjunctivae normal, appears pale ENMT: Mucous membranes are moist. Posterior pharynx clear of any exudate or lesions.Normal dentition.  Neck: normal, supple, no masses, no thyromegaly Respiratory: clear to auscultation bilaterally, no wheezing, no crackles. Normal respiratory effort. No accessory muscle use.  Cardiovascular: Regular rate and rhythm, no murmurs / rubs / gallops. No extremity edema. 2+ pedal pulses. No carotid bruits.  Abdomen: no tenderness, no masses palpated. No hepatosplenomegaly. Bowel sounds positive.  Musculoskeletal: no clubbing / cyanosis. No joint deformity upper  and lower extremities. Good ROM, no contractures. Normal muscle tone.  Skin: no rashes, lesions, ulcers. No induration Neurologic: CN 2-12 grossly intact. Sensation intact, DTR normal. Strength 5/5 in all 4.  Psychiatric: Normal judgment and insight. Alert and oriented x 3. Normal mood.    Labs on Admission: I have personally reviewed following labs and imaging studies  CBC: Recent Labs  Lab 06/21/24 1450 06/22/24 1410  WBC 7.3 4.9  HGB 6.3* 5.6*  HCT 23.3* 20.5*  MCV 73* 72.2*  PLT 325 247   Basic Metabolic Panel: Recent Labs  Lab 06/21/24 1450 06/22/24 1410  NA 142 139  K 3.9 3.6  CL 110* 110  CO2 19* 21*  GLUCOSE 95 99  BUN 19 21  CREATININE 0.67 0.74  CALCIUM 9.7 9.5   GFR: Estimated Creatinine Clearance: 45.2 mL/min (by C-G formula based on SCr of 0.74 mg/dL). Liver Function Tests: Recent Labs  Lab 06/21/24 1450  AST 19  ALT 10  ALKPHOS 113  BILITOT 0.3  PROT 6.0  ALBUMIN 3.6   No results for input(s): LIPASE, AMYLASE in the last 168 hours. No results for input(s): AMMONIA in the last 168 hours. Coagulation Profile: No results for input(s): INR, PROTIME in the last 168 hours. Cardiac Enzymes: No results for input(s): CKTOTAL, CKMB, CKMBINDEX, TROPONINI  in the last 168 hours. BNP (last 3 results) No results for input(s): PROBNP in the last 8760 hours. HbA1C: Recent Labs    06/21/24 1450  HGBA1C 5.7*   CBG: No results for input(s): GLUCAP in the last 168 hours. Lipid Profile: No results for input(s): CHOL, HDL, LDLCALC, TRIG, CHOLHDL, LDLDIRECT in the last 72 hours. Thyroid  Function Tests: Recent Labs    06/21/24 1450  TSH 2.190   Anemia Panel: No results for input(s): VITAMINB12, FOLATE, FERRITIN, TIBC, IRON, RETICCTPCT in the last 72 hours. Urine analysis: No results found for: COLORURINE, APPEARANCEUR, LABSPEC, PHURINE, GLUCOSEU, HGBUR, BILIRUBINUR, KETONESUR, PROTEINUR, UROBILINOGEN, NITRITE, LEUKOCYTESUR  Radiological Exams on Admission: No results found.  EKG: Independently reviewed. Sinus rhythm Atrial premature complexes Right bundle branch block  Assessment/Plan Principal Problem:   Upper GI bleed   Acute blood loss anemia secondary to presumed upper GI bleed, POA: Hemoglobin 5.6.  2 units PRBC transfusion ordered by ED.  She is already receiving the first 1.  GI is consulted.  They have recommended keeping her n.p.o. from midnight.  Will start on clear liquid diet and n.p.o. midnight.  Started on Protonix  twice daily.  Repeat CBC after transfusion completed as well as in the morning and transfuse if less than 7.  Hold Xarelto .  Paroxysmal atrial fibrillation: Currently in sinus rhythm.  Takes Xarelto  at home which we will hold due to presumed upper GI bleed and acute blood loss anemia.  Resume Toprol -XL for rate control.  Anxiety: Resume home dose of Xanax.  DVT prophylaxis: SCDs Start: 06/22/24 1558SCD Code Status: DNR Family Communication: Niece present at bedside.  Plan of care discussed with patient in length and he verbalized understanding and agreed with it. Disposition Plan: May discharge in 1 to 2 days Consults called: GI/Dr. Rogers Fredia Skeeter  MD [facility]  *Please note that this is a verbal dictation therefore any spelling or grammatical errors are due to the Dragon Medical One system interpretation.  Please page via Amion and do not message via secure chat for urgent patient care matters. Secure chat can be used for non urgent patient care matters. 06/22/2024, 6:53 PM  To contact the  attending provider between 7A-7P or the covering provider during after hours 7P-7A, please log into the web site [email or web address]

## 2024-06-22 NOTE — Telephone Encounter (Signed)
 All Conversations (Newest Message First)            Okey Vina GAILS, MD to [facility] Triage      06/22/24 11:16 AM I left a VM on her answering machine   Did not speak to her   Told her to go to ER  If cannot reach would call 911 Okey Vina GAILS, MD to [facility] Triage     06/22/24 10:38 AM Result Note Stop Xarelto  Pt needs to probably go to ER   Hgb is dangerously low    Needs to be repeated and if low she needs a transfusion TSH; Hemoglobin A1c; Comprehensive metabolic panel with GFR; CBC; Brain natriuretic peptide      11:44 am I spoke with wendy at Dr.Fagans office and relayed critical hbg level to her. She states she will try to reach patient also    11:58 am  I spoke with patient and she states she will call 911

## 2024-06-22 NOTE — Telephone Encounter (Signed)
 Caller Marcelle) stated patient called their office and wants a call back to clarify next steps.

## 2024-06-22 NOTE — ED Notes (Signed)
 Date and time results received: 06/22/24 1435 (use smartphrase .now to insert current time)  Test: hgb Critical Value: 5.6  Name of Provider Notified: Dr Bernard  Orders Received? Or Actions Taken?: Orders Received - See Orders for details

## 2024-06-22 NOTE — ED Triage Notes (Signed)
 Pt called by Dr to come to ED due to hgb 6.3. pt arrived a/o. Nad. Denies pain/weakness/shob. Chronic ble swelling noted. Pt states they go down at night. Color wnl. Non diaphoretic. Pt has no complaints.

## 2024-06-22 NOTE — Telephone Encounter (Signed)
 Was informed by [facility] of patients hgb and informed them she in currently in the ED

## 2024-06-22 NOTE — Telephone Encounter (Signed)
 [facility] is calling with alert lab results   [phone] option 1

## 2024-06-23 ENCOUNTER — Encounter (HOSPITAL_COMMUNITY): Payer: Self-pay | Admitting: Family Medicine

## 2024-06-23 DIAGNOSIS — D123 Benign neoplasm of transverse colon: Secondary | ICD-10-CM | POA: Diagnosis not present

## 2024-06-23 DIAGNOSIS — K254 Chronic or unspecified gastric ulcer with hemorrhage: Secondary | ICD-10-CM | POA: Diagnosis not present

## 2024-06-23 DIAGNOSIS — I480 Paroxysmal atrial fibrillation: Secondary | ICD-10-CM | POA: Diagnosis not present

## 2024-06-23 DIAGNOSIS — K922 Gastrointestinal hemorrhage, unspecified: Secondary | ICD-10-CM | POA: Diagnosis not present

## 2024-06-23 DIAGNOSIS — R195 Other fecal abnormalities: Secondary | ICD-10-CM | POA: Diagnosis not present

## 2024-06-23 DIAGNOSIS — K269 Duodenal ulcer, unspecified as acute or chronic, without hemorrhage or perforation: Secondary | ICD-10-CM | POA: Diagnosis not present

## 2024-06-23 DIAGNOSIS — D649 Anemia, unspecified: Secondary | ICD-10-CM | POA: Diagnosis not present

## 2024-06-23 DIAGNOSIS — Z7901 Long term (current) use of anticoagulants: Secondary | ICD-10-CM

## 2024-06-23 DIAGNOSIS — K449 Diaphragmatic hernia without obstruction or gangrene: Secondary | ICD-10-CM | POA: Diagnosis not present

## 2024-06-23 DIAGNOSIS — D500 Iron deficiency anemia secondary to blood loss (chronic): Secondary | ICD-10-CM | POA: Diagnosis not present

## 2024-06-23 DIAGNOSIS — D120 Benign neoplasm of cecum: Secondary | ICD-10-CM | POA: Diagnosis not present

## 2024-06-23 DIAGNOSIS — K3189 Other diseases of stomach and duodenum: Secondary | ICD-10-CM | POA: Diagnosis not present

## 2024-06-23 DIAGNOSIS — K5730 Diverticulosis of large intestine without perforation or abscess without bleeding: Secondary | ICD-10-CM | POA: Diagnosis not present

## 2024-06-23 DIAGNOSIS — C180 Malignant neoplasm of cecum: Secondary | ICD-10-CM | POA: Diagnosis not present

## 2024-06-23 LAB — TYPE AND SCREEN
ABO/RH(D): A POS
Antibody Screen: NEGATIVE
Unit division: 0
Unit division: 0
Unit division: 0
Unit division: 0

## 2024-06-23 LAB — CBC
HCT: 30.7 % — ABNORMAL LOW (ref 36.0–46.0)
Hemoglobin: 9.1 g/dL — ABNORMAL LOW (ref 12.0–15.0)
MCH: 22.1 pg — ABNORMAL LOW (ref 26.0–34.0)
MCHC: 29.6 g/dL — ABNORMAL LOW (ref 30.0–36.0)
MCV: 74.7 fL — ABNORMAL LOW (ref 80.0–100.0)
Platelets: 241 10*3/uL (ref 150–400)
RBC: 4.11 MIL/uL (ref 3.87–5.11)
RDW: 18.5 % — ABNORMAL HIGH (ref 11.5–15.5)
WBC: 7.2 10*3/uL (ref 4.0–10.5)
nRBC: 0 % (ref 0.0–0.2)

## 2024-06-23 LAB — BPAM RBC
Blood Product Expiration Date: 202507012359
Blood Product Expiration Date: 202507082359
Blood Product Expiration Date: 202508022359
Blood Product Expiration Date: 202508022359
ISSUE DATE / TIME: 202507011601
ISSUE DATE / TIME: 202507012023
ISSUE DATE / TIME: 202507012349
Unit Type and Rh: 6200
Unit Type and Rh: 6200
Unit Type and Rh: 6200
Unit Type and Rh: 9500

## 2024-06-23 LAB — BASIC METABOLIC PANEL WITH GFR
Anion gap: 11 (ref 5–15)
BUN: 13 mg/dL (ref 8–23)
CO2: 19 mmol/L — ABNORMAL LOW (ref 22–32)
Calcium: 9.3 mg/dL (ref 8.9–10.3)
Chloride: 114 mmol/L — ABNORMAL HIGH (ref 98–111)
Creatinine, Ser: 0.51 mg/dL (ref 0.44–1.00)
GFR, Estimated: >60 mL/min (ref >60)
Glucose, Bld: 87 mg/dL (ref 70–99)
Potassium: 3.2 mmol/L — ABNORMAL LOW (ref 3.5–5.1)
Sodium: 144 mmol/L (ref 135–145)

## 2024-06-23 MED ORDER — METOPROLOL SUCCINATE ER 25 MG PO TB24
12.5000 mg | ORAL_TABLET | Freq: Two times a day (BID) | ORAL | Status: DC
  Administered 2024-06-23 – 2024-07-02 (×18): 12.5 mg via ORAL
  Filled 2024-06-23 (×18): qty 1

## 2024-06-23 MED ORDER — ANASTROZOLE 1 MG PO TABS
1.0000 mg | ORAL_TABLET | Freq: Every evening | ORAL | Status: DC
  Administered 2024-06-23 – 2024-07-01 (×9): 1 mg via ORAL
  Filled 2024-06-23 (×10): qty 1

## 2024-06-23 MED ORDER — PEG 3350-KCL-NA BICARB-NACL 420 G PO SOLR
4000.0000 mL | Freq: Once | ORAL | Status: AC
  Administered 2024-06-23: 4000 mL via ORAL

## 2024-06-23 MED ORDER — POTASSIUM CHLORIDE 10 MEQ/100ML IV SOLN
10.0000 meq | INTRAVENOUS | Status: AC
  Administered 2024-06-23 (×3): 10 meq via INTRAVENOUS
  Filled 2024-06-23 (×3): qty 100

## 2024-06-23 NOTE — Plan of Care (Signed)
   Problem: Education: Goal: Knowledge of General Education information will improve Description Including pain rating scale, medication(s)/side effects and non-pharmacologic comfort measures Outcome: Progressing   Problem: Health Behavior/Discharge Planning: Goal: Ability to manage health-related needs will improve Outcome: Progressing

## 2024-06-23 NOTE — Progress Notes (Signed)
 Transition of Care Department [facility]) has reviewed patient and no other TOC needs have been identified at this time. We will continue to monitor patient advancement through interdisciplinary progression rounds. If new patient transition needs arise, please place a TOC consult.   06/23/24 0813  TOC Brief Assessment  Insurance and Status Reviewed  Patient has primary care physician Yes  Home environment has been reviewed Lives alone.  Prior level of function: Independent.  Prior/Current Home Services No current home services  Social Drivers of Health Review SDOH reviewed no interventions necessary  Readmission risk has been reviewed Yes  Transition of care needs no transition of care needs at this time

## 2024-06-23 NOTE — Care Management Obs Status (Signed)
 MEDICARE OBSERVATION STATUS NOTIFICATION   Patient Details  Name: Hailey Graham MRN: [id number] Date of Birth: 23-Mar-1928   Medicare Observation Status Notification Given:  Yes    Duwaine LITTIE Ada 06/23/2024, 4:29 PM

## 2024-06-23 NOTE — Progress Notes (Addendum)
 PROGRESS NOTE   Hailey Graham  FMW:[id number] DOB: [birth year] DOA: 06/22/2024 PCP: Sheryle Carwin, MD   Chief Complaint  Patient presents with   Anemia   Level of care: Telemetry  Brief Admission History:  89 y.o. female with medical history significant of paroxysmal atrial fibrillation on Xarelto  and no other medical history who saw her cardiologist last Thursday for routine checkup, she was supposed to go for the lab check next day Friday but some reason order was not sent to the laboratory.  She went yesterday, blood was drawn, however CBC resulted today with hemoglobin under 7 so she was called by her cardiologist Dr. Okey to present to the ED.  Patient herself has no complaints at all.  She denies any nausea, vomiting, chest pain, shortness of breath, palpitation, any problem with urination or bowel bowel movements, no hematochezia, hematemesis or melena.   ED Course: Upon arrival to ED, patient fairly stable hemodynamically.  CBC showed hemoglobin of 5.6.  FOBT positive.  BMP not drawn.  2 units of PRBC transfusion ordered.  ED physician consulted GI, they recommended admission.  Hospitalist called for admission.   Assessment and Plan:  Acute blood loss anemia secondary to presumed upper GI bleed, POA: Hemoglobin 5.6.  2 units PRBC transfusion ordered by ED.  She is already receiving the first 1.  GI is consulted.  They have recommended keeping her n.p.o. from midnight.  Will start on clear liquid diet and n.p.o. midnight.  Started on Protonix  twice daily.  Repeat CBC after transfusion completed as well as in the morning and transfuse if less than 7.  Hold Xarelto .  --appreciate GI consult and plans for EGD/colonoscopy for further evaluation    Paroxysmal atrial fibrillation: Currently in sinus rhythm.  Takes Xarelto  at home which we will hold due to presumed upper GI bleed and acute blood loss anemia.  Resume Toprol -XL for rate control. Pt says she takes 12.5 mg twice daily which we have  resumed how she takes at home    Anxiety: Resume home dose of Xanax.  History of left breast cancer -- resumed home arimedex   Hypokalemia -- IV replacement ordered -- check BMP in AM with Mg  DVT prophylaxis: SCDs Code Status: DNR  Family Communication:  Disposition anticipate home    Consultants:  GI services  Procedures:   Antimicrobials:    Subjective: Pt says she feels normal and is surprised her Hg was so low.    Objective: Vitals:   06/23/24 0015 06/23/24 0300 06/23/24 0421 06/23/24 1326  BP: 132/69 (!) 142/74 135/61 (!) 115/54  Pulse: 63 76 78 72  Resp:  12    Temp: 98 F (36.7 C) 98 F (36.7 C) 98.1 F (36.7 C) 98.3 F (36.8 C)  TempSrc: Oral Oral Oral Oral  SpO2: 99% 99% 96% 96%  Weight:      Height:        Intake/Output Summary (Last 24 hours) at 06/23/2024 1733 Last data filed at 06/23/2024 1300 Gross per 24 hour  Intake 1843.33 ml  Output 2100 ml  Net -256.67 ml   Filed Weights   06/22/24 1732 06/22/24 2047  Weight: 74 kg 74 kg   Examination:  General exam: Appears calm and comfortable  Respiratory system: Clear to auscultation. Respiratory effort normal. Cardiovascular system: normal S1 & S2 heard. No JVD, murmurs, rubs, gallops or clicks. No pedal edema. Gastrointestinal system: Abdomen is nondistended, soft and nontender. No organomegaly or masses felt. Normal bowel sounds heard.  Central nervous system: Alert and oriented. No focal neurological deficits. Extremities: Symmetric 5 x 5 power. Skin: No rashes, lesions or ulcers. Psychiatry: Judgement and insight appear normal. Mood & affect appropriate.   Data Reviewed: I have personally reviewed following labs and imaging studies  CBC: Recent Labs  Lab 06/21/24 1450 06/22/24 1410 06/23/24 0431  WBC 7.3 4.9 7.2  HGB 6.3* 5.6* 9.1*  HCT 23.3* 20.5* 30.7*  MCV 73* 72.2* 74.7*  PLT 325 247 241    Basic Metabolic Panel: Recent Labs  Lab 06/21/24 1450 06/22/24 1410 06/23/24 0431   NA 142 139 144  K 3.9 3.6 3.2*  CL 110* 110 114*  CO2 19* 21* 19*  GLUCOSE 95 99 87  BUN 19 21 13   CREATININE 0.67 0.74 0.51  CALCIUM 9.7 9.5 9.3    CBG: No results for input(s): GLUCAP in the last 168 hours.  No results found for this or any previous visit (from the past 240 hours).   Radiology Studies: No results found.  Scheduled Meds:  anastrozole   1 mg Oral QPM   dorzolamide-timolol  1 drop Both Eyes BID   ezetimibe   10 mg Oral Daily   And   simvastatin   20 mg Oral q1800   metoprolol  succinate  12.5 mg Oral BID   pantoprazole  (PROTONIX ) IV  40 mg Intravenous Q12H   pilocarpine  1 drop Right Eye QID   sodium chloride  flush  3 mL Intravenous Q12H   Continuous Infusions:   LOS: 0 days   Time spent: 54 mins  Deivi Huckins Vicci, MD How to contact the [facility] Attending or Consulting provider 7A - 7P or covering provider during after hours 7P -7A, for this patient?  Check the care team in [facility] and look for a) attending/consulting [facility] provider listed and b) the [facility] team listed Log into [email or web address] to find provider on call.  Locate the [facility] provider you are looking for under [facility] and page to a number that you can be directly reached. If you still have difficulty reaching the provider, please page the [facility] (Director on Call) for the Hospitalists listed on amion for assistance.  06/23/2024, 5:33 PM

## 2024-06-23 NOTE — Plan of Care (Signed)

## 2024-06-23 NOTE — Consult Note (Signed)
 Gastroenterology Consult   Referring Provider: No ref. provider found Primary Care Physician:  Sheryle Carwin, MD Primary Gastroenterologist:  Previously Dr. Eartha  Patient ID: Hailey Graham; [id number]; [birth year]   Admit date: 06/22/2024  LOS: 0 days   Date of Consultation: 06/23/2024  Reason for Consultation:  anemia  History of Present Illness   Hailey Graham is a 89 y.o. year old female with history of anxiety, arthritis, breast cancer, depression, GERD, HLD, Paroxysmal A fib on Xarelto  who presented to the ED with outpatient labs showing low hemoglobin. Hgb on arrival was 5.6, with stool heme positive. GI consulted for further evaluation.   ED course: Hgb 5.6 FOBT positive  Transfused x2 units PRBCs  Consult: Patient states that she had routine labs and was told hgb was low. Denies any history of the same. States she had been feeling her normal state of health. She has constipation intermittently at baseline. She had a BM this morning, she does not think it had any presence of blood or black color to her stools. She denies any abdominal pain, nausea, or vomiting. No changes in appetite or weight loss. She notes her xarelto  dose was changed a few months back from 15mg  to 20mg  by cardiology due to her creatnine clearance (per chart review).   No NSAID use No etoh or tobacco No history of CRC pt is aware of  Last dose of xarelto  was Monday evening  Last EGD:2012 2 submucosal lesions in gastric body, a few duodenal polyps (benign gatstric mucosa) Last Colonoscopy: 15-20 years, a few polyps patient   Past Medical History:  Diagnosis Date   Allergy    Anxiety    Arthritis    Breast cancer of upper-outer quadrant of left female breast (HCC) 11/24/2015   Cataract    Complication of anesthesia    Depression    Diverticulosis    Gait abnormality    Gastroesophageal reflux disease    Glaucoma    Hyperlipidemia    Lipid profile in 05/2010:149, 86, 48, 84   Lung nodule     Right lower lobe; stable in 2010   Paroxysmal atrial fibrillation (HCC)    Rate related right bundle branch block; normal EF on echo in 2006; normal stress nuclear-2006; mild RV hypokinesis on MRI;  RA and RV enlargement on CT in 9/06; Anticoagulation->discontinued in 2009   Personal history of kidney stones    PONV (postoperative nausea and vomiting)    Nausea     Prior to Admission medications   Medication Sig Start Date End Date Taking? Authorizing Provider  acetaminophen (TYLENOL) 500 MG tablet Take 1,000 mg by mouth as needed for mild pain (pain score 1-3), moderate pain (pain score 4-6), fever or headache.   Yes [provider]  ALPRAZolam (XANAX) 0.25 MG tablet Take 0.25 mg by mouth daily as needed for anxiety. 01/30/24  Yes [provider]  anastrozole  (ARIMIDEX ) 1 MG tablet Take 1 tablet (1 mg total) by mouth daily. 11/19/23  Yes Gudena, Vinay, MD  Cholecalciferol (VITAMIN D ) 2000 UNITS tablet Take 2,000 Units by mouth daily.   Yes [provider]  Cyanocobalamin (B-12) 1000 MCG TABS Take 1 tablet by mouth daily.   Yes [provider]  dorzolamide-timolol (COSOPT) 22.3-6.8 MG/ML ophthalmic solution Place 1 drop into both eyes 2 (two) times daily. 05/24/20  Yes [provider]  ezetimibe -simvastatin  (VYTORIN ) 10-20 MG tablet Take 1 tablet by mouth daily. 10/20/23  Yes Strader, [place] M, PA-C  latanoprost (  XALATAN) 0.005 % ophthalmic solution Place 1 drop into both eyes at bedtime. 03/20/20  Yes [provider]  metoprolol  succinate (TOPROL -XL) 25 MG 24 hr tablet TAKE (1/2) TABLET BY MOUTH TWICE DAILY Patient taking differently: Take 12.5 mg by mouth in the morning and at bedtime. 10/31/23  Yes Strader, [place] M, PA-C  pilocarpine (PILOCAR) 1 % ophthalmic solution Place 1 drop into the right eye 4 times daily. 06/11/21  Yes [provider]  rivaroxaban  (XARELTO ) 20 MG TABS tablet Take 20 mg by mouth daily with supper.   Yes  [provider]    Current Facility-Administered Medications  Medication Dose Route Frequency Provider Last Rate Last Admin   acetaminophen (TYLENOL) tablet 500 mg  500 mg Oral PRN Pahwani, Ravi, MD       ALPRAZolam (XANAX) tablet 0.25 mg  0.25 mg Oral Daily PRN Pahwani, Ravi, MD       dorzolamide-timolol (COSOPT) 2-0.5 % ophthalmic solution 1 drop  1 drop Both Eyes BID Vernon Ranks, MD       ezetimibe  (ZETIA ) tablet 10 mg  10 mg Oral Daily Pahwani, Ravi, MD   10 mg at 06/23/24 9141   And   simvastatin  (ZOCOR ) tablet 20 mg  20 mg Oral q1800 Pahwani, Ravi, MD   20 mg at 06/22/24 1947   metoprolol  succinate (TOPROL -XL) 24 hr tablet 25 mg  25 mg Oral Daily Pahwani, Ravi, MD   25 mg at 06/23/24 0859   ondansetron  (ZOFRAN ) tablet 4 mg  4 mg Oral Q6H PRN Vernon Ranks, MD       Or   ondansetron  (ZOFRAN ) injection 4 mg  4 mg Intravenous Q6H PRN Pahwani, Ravi, MD       pantoprazole  (PROTONIX ) injection 40 mg  40 mg Intravenous Q12H Pahwani, Ravi, MD   40 mg at 06/22/24 1948   pilocarpine (PILOCAR) 1 % ophthalmic solution 1 drop  1 drop Right Eye QID Pahwani, Ranks, MD       potassium chloride 10 mEq in 100 mL IVPB  10 mEq Intravenous Q1 Hr x 3 Johnson, Clanford L, MD       sodium chloride  flush (NS) 0.9 % injection 3 mL  3 mL Intravenous Q12H Vernon Ranks, MD   3 mL at 06/22/24 2040    Allergies as of 06/22/2024 - Review Complete 06/22/2024  Allergen Reaction Noted   Brimonidine Other (See Comments) 12/12/2015   Sulfa drugs cross reactors Other (See Comments) 01/25/2011   Codeine Nausea Only 06/15/2010   Morphine Nausea Only 06/15/2010   Sulfamethoxazole Nausea Only 10/23/2012    Review of Systems   Gen: Denies any fever, chills, loss of appetite, change in weight or weight loss CV: Denies chest pain, heart palpitations, syncope, edema  Resp: Denies shortness of breath with rest, cough, wheezing, coughing up blood, and pleurisy. GI: denies melena, hematochezia, nausea, vomiting,  diarrhea, constipation, dysphagia, odyonophagia, early satiety or weight loss.  GU : Denies urinary burning, blood in urine, urinary frequency, and urinary incontinence. MS: Denies joint pain, limitation of movement, swelling, cramps, and atrophy.  Derm: Denies rash, itching, dry skin, hives. Psych: Denies depression, anxiety, memory loss, hallucinations, and confusion. Heme: Denies bruising or bleeding Neuro:  Denies any headaches, dizziness, paresthesias, shaking  Physical Exam   Vital Signs in last 24 hours: Temp:  [97.7 F (36.5 C)-98.2 F (36.8 C)] 98.1 F (36.7 C) (07/02 0421) Pulse Rate:  [62-78] 78 (07/02 0421) Resp:  [12-20] 12 (07/02 0300) BP: (114-160)/(46-110) 135/61 (07/02  0421) SpO2:  [94 %-100 %] 96 % (07/02 0421) Weight:  [74 kg] 74 kg (07/01 2047) Last BM Date : 06/21/24  General:   Alert,  Well-developed, well-nourished, pleasant and cooperative in NAD Head:  Normocephalic and atraumatic. Eyes:  Sclera clear, no icterus.   Conjunctiva pink. Ears:  Normal auditory acuity. Mouth:  No deformity or lesions, dentition normal. Neck:  Supple; no masses Lungs:  Clear throughout to auscultation.   No wheezes, crackles, or rhonchi. No acute distress. Heart:  Regular rate and rhythm; no murmurs, clicks, rubs,  or gallops. Abdomen:  Soft, nontender and nondistended. No masses, hepatosplenomegaly or hernias noted. Normal bowel sounds, without guarding, and without rebound.   Msk:  Symmetrical without gross deformities. Normal posture. Extremities:  Without clubbing or edema. Neurologic:  Alert and  oriented x4. Skin:  Intact without significant lesions or rashes. Psych:  Alert and cooperative. Normal mood and affect.  Labs/Studies   Recent Labs Recent Labs    06/21/24 1450 06/22/24 1410 06/23/24 0431  WBC 7.3 4.9 7.2  HGB 6.3* 5.6* 9.1*  HCT 23.3* 20.5* 30.7*  PLT 325 247 241   BMET Recent Labs    06/21/24 1450 06/22/24 1410 06/23/24 0431  NA 142 139 144   K 3.9 3.6 3.2*  CL 110* 110 114*  CO2 19* 21* 19*  GLUCOSE 95 99 87  BUN 19 21 13   CREATININE 0.67 0.74 0.51  CALCIUM 9.7 9.5 9.3   LFT Recent Labs    06/21/24 1450  PROT 6.0  ALBUMIN 3.6  AST 19  ALT 10  ALKPHOS 113  BILITOT 0.3    Assessment   MAISHA BOGEN is a 89 y.o. year old female with history of anxiety, arthritis, breast cancer, depression, GERD, HLD, Paroxysmal A fib on Xarelto  who presented to the ED with significant anemia. Hgb on arrival was 5.6, with stool heme positive. GI consulted for further evaluation.   Anemia: hemoglobin 5.6 on arrival, 6.3 on 6/30, hgb was 12.2 in December 2024, no recent labs in between then and now for comparison. She notes recent increase in xarelto  from 15 to 20mg  over the past few months. She denies any associated symptoms of her anemia or any GI symptoms. Last TCS was many years ago, last EGD as above in 2012. At this time, I am recommending colonoscopy and EGD tomorrow as she warrants bidirectional endoscopic evaluation for any source of bleeding in the GI tract. Indications, risks and benefits of procedure discussed in detail with patient. Patient verbalized understanding and is in agreement to proceed with EGD and Colonoscopy on Thursday.    Last xarelto  dose Monday evening    Plan / Recommendations   Continue to Hold xarelto  Trend h&h, transfuse for hgb <7 PPI BID EGD/Colonoscopy tomorrow  Monitor for overt GI bleeding Clear liquids today, NPO midnight     06/23/2024, 9:06 AM  Clancey Welton L. Cricket Goodlin, MSN, APRN, AGNP-C Adult-Gerontology Nurse Practitioner [facility] Gastroenterology at [facility]

## 2024-06-23 NOTE — H&P (View-Only) (Signed)
 Gastroenterology Consult   Referring Provider: No ref. provider found Primary Care Physician:  Sheryle Carwin, MD Primary Gastroenterologist:  Previously Dr. Eartha  Patient ID: Hailey Graham; [id number]; [birth year]   Admit date: 06/22/2024  LOS: 0 days   Date of Consultation: 06/23/2024  Reason for Consultation:  anemia  History of Present Illness   Hailey Graham is a 89 y.o. year old female with history of anxiety, arthritis, breast cancer, depression, GERD, HLD, Paroxysmal A fib on Xarelto  who presented to the ED with outpatient labs showing low hemoglobin. Hgb on arrival was 5.6, with stool heme positive. GI consulted for further evaluation.   ED course: Hgb 5.6 FOBT positive  Transfused x2 units PRBCs  Consult: Patient states that she had routine labs and was told hgb was low. Denies any history of the same. States she had been feeling her normal state of health. She has constipation intermittently at baseline. She had a BM this morning, she does not think it had any presence of blood or black color to her stools. She denies any abdominal pain, nausea, or vomiting. No changes in appetite or weight loss. She notes her xarelto  dose was changed a few months back from 15mg  to 20mg  by cardiology due to her creatnine clearance (per chart review).   No NSAID use No etoh or tobacco No history of CRC pt is aware of  Last dose of xarelto  was Monday evening  Last EGD:2012 2 submucosal lesions in gastric body, a few duodenal polyps (benign gatstric mucosa) Last Colonoscopy: 15-20 years, a few polyps patient   Past Medical History:  Diagnosis Date   Allergy    Anxiety    Arthritis    Breast cancer of upper-outer quadrant of left female breast (HCC) 11/24/2015   Cataract    Complication of anesthesia    Depression    Diverticulosis    Gait abnormality    Gastroesophageal reflux disease    Glaucoma    Hyperlipidemia    Lipid profile in 05/2010:149, 86, 48, 84   Lung nodule     Right lower lobe; stable in 2010   Paroxysmal atrial fibrillation (HCC)    Rate related right bundle branch block; normal EF on echo in 2006; normal stress nuclear-2006; mild RV hypokinesis on MRI;  RA and RV enlargement on CT in 9/06; Anticoagulation->discontinued in 2009   Personal history of kidney stones    PONV (postoperative nausea and vomiting)    Nausea     Prior to Admission medications   Medication Sig Start Date End Date Taking? Authorizing Provider  acetaminophen (TYLENOL) 500 MG tablet Take 1,000 mg by mouth as needed for mild pain (pain score 1-3), moderate pain (pain score 4-6), fever or headache.   Yes [provider]  ALPRAZolam (XANAX) 0.25 MG tablet Take 0.25 mg by mouth daily as needed for anxiety. 01/30/24  Yes [provider]  anastrozole  (ARIMIDEX ) 1 MG tablet Take 1 tablet (1 mg total) by mouth daily. 11/19/23  Yes Gudena, Vinay, MD  Cholecalciferol (VITAMIN D ) 2000 UNITS tablet Take 2,000 Units by mouth daily.   Yes [provider]  Cyanocobalamin (B-12) 1000 MCG TABS Take 1 tablet by mouth daily.   Yes [provider]  dorzolamide-timolol (COSOPT) 22.3-6.8 MG/ML ophthalmic solution Place 1 drop into both eyes 2 (two) times daily. 05/24/20  Yes [provider]  ezetimibe -simvastatin  (VYTORIN ) 10-20 MG tablet Take 1 tablet by mouth daily. 10/20/23  Yes Strader, [place] M, PA-C  latanoprost (  XALATAN) 0.005 % ophthalmic solution Place 1 drop into both eyes at bedtime. 03/20/20  Yes [provider]  metoprolol  succinate (TOPROL -XL) 25 MG 24 hr tablet TAKE (1/2) TABLET BY MOUTH TWICE DAILY Patient taking differently: Take 12.5 mg by mouth in the morning and at bedtime. 10/31/23  Yes Strader, [place] M, PA-C  pilocarpine (PILOCAR) 1 % ophthalmic solution Place 1 drop into the right eye 4 times daily. 06/11/21  Yes [provider]  rivaroxaban  (XARELTO ) 20 MG TABS tablet Take 20 mg by mouth daily with supper.   Yes  [provider]    Current Facility-Administered Medications  Medication Dose Route Frequency Provider Last Rate Last Admin   acetaminophen (TYLENOL) tablet 500 mg  500 mg Oral PRN Pahwani, Ravi, MD       ALPRAZolam (XANAX) tablet 0.25 mg  0.25 mg Oral Daily PRN Pahwani, Ravi, MD       dorzolamide-timolol (COSOPT) 2-0.5 % ophthalmic solution 1 drop  1 drop Both Eyes BID Vernon Ranks, MD       ezetimibe  (ZETIA ) tablet 10 mg  10 mg Oral Daily Pahwani, Ravi, MD   10 mg at 06/23/24 9141   And   simvastatin  (ZOCOR ) tablet 20 mg  20 mg Oral q1800 Pahwani, Ravi, MD   20 mg at 06/22/24 1947   metoprolol  succinate (TOPROL -XL) 24 hr tablet 25 mg  25 mg Oral Daily Pahwani, Ravi, MD   25 mg at 06/23/24 0859   ondansetron  (ZOFRAN ) tablet 4 mg  4 mg Oral Q6H PRN Vernon Ranks, MD       Or   ondansetron  (ZOFRAN ) injection 4 mg  4 mg Intravenous Q6H PRN Pahwani, Ravi, MD       pantoprazole  (PROTONIX ) injection 40 mg  40 mg Intravenous Q12H Pahwani, Ravi, MD   40 mg at 06/22/24 1948   pilocarpine (PILOCAR) 1 % ophthalmic solution 1 drop  1 drop Right Eye QID Pahwani, Ranks, MD       potassium chloride 10 mEq in 100 mL IVPB  10 mEq Intravenous Q1 Hr x 3 Johnson, Clanford L, MD       sodium chloride  flush (NS) 0.9 % injection 3 mL  3 mL Intravenous Q12H Vernon Ranks, MD   3 mL at 06/22/24 2040    Allergies as of 06/22/2024 - Review Complete 06/22/2024  Allergen Reaction Noted   Brimonidine Other (See Comments) 12/12/2015   Sulfa drugs cross reactors Other (See Comments) 01/25/2011   Codeine Nausea Only 06/15/2010   Morphine Nausea Only 06/15/2010   Sulfamethoxazole Nausea Only 10/23/2012    Review of Systems   Gen: Denies any fever, chills, loss of appetite, change in weight or weight loss CV: Denies chest pain, heart palpitations, syncope, edema  Resp: Denies shortness of breath with rest, cough, wheezing, coughing up blood, and pleurisy. GI: denies melena, hematochezia, nausea, vomiting,  diarrhea, constipation, dysphagia, odyonophagia, early satiety or weight loss.  GU : Denies urinary burning, blood in urine, urinary frequency, and urinary incontinence. MS: Denies joint pain, limitation of movement, swelling, cramps, and atrophy.  Derm: Denies rash, itching, dry skin, hives. Psych: Denies depression, anxiety, memory loss, hallucinations, and confusion. Heme: Denies bruising or bleeding Neuro:  Denies any headaches, dizziness, paresthesias, shaking  Physical Exam   Vital Signs in last 24 hours: Temp:  [97.7 F (36.5 C)-98.2 F (36.8 C)] 98.1 F (36.7 C) (07/02 0421) Pulse Rate:  [62-78] 78 (07/02 0421) Resp:  [12-20] 12 (07/02 0300) BP: (114-160)/(46-110) 135/61 (07/02  0421) SpO2:  [94 %-100 %] 96 % (07/02 0421) Weight:  [74 kg] 74 kg (07/01 2047) Last BM Date : 06/21/24  General:   Alert,  Well-developed, well-nourished, pleasant and cooperative in NAD Head:  Normocephalic and atraumatic. Eyes:  Sclera clear, no icterus.   Conjunctiva pink. Ears:  Normal auditory acuity. Mouth:  No deformity or lesions, dentition normal. Neck:  Supple; no masses Lungs:  Clear throughout to auscultation.   No wheezes, crackles, or rhonchi. No acute distress. Heart:  Regular rate and rhythm; no murmurs, clicks, rubs,  or gallops. Abdomen:  Soft, nontender and nondistended. No masses, hepatosplenomegaly or hernias noted. Normal bowel sounds, without guarding, and without rebound.   Msk:  Symmetrical without gross deformities. Normal posture. Extremities:  Without clubbing or edema. Neurologic:  Alert and  oriented x4. Skin:  Intact without significant lesions or rashes. Psych:  Alert and cooperative. Normal mood and affect.  Labs/Studies   Recent Labs Recent Labs    06/21/24 1450 06/22/24 1410 06/23/24 0431  WBC 7.3 4.9 7.2  HGB 6.3* 5.6* 9.1*  HCT 23.3* 20.5* 30.7*  PLT 325 247 241   BMET Recent Labs    06/21/24 1450 06/22/24 1410 06/23/24 0431  NA 142 139 144   K 3.9 3.6 3.2*  CL 110* 110 114*  CO2 19* 21* 19*  GLUCOSE 95 99 87  BUN 19 21 13   CREATININE 0.67 0.74 0.51  CALCIUM 9.7 9.5 9.3   LFT Recent Labs    06/21/24 1450  PROT 6.0  ALBUMIN 3.6  AST 19  ALT 10  ALKPHOS 113  BILITOT 0.3    Assessment   Hailey Graham is a 89 y.o. year old female with history of anxiety, arthritis, breast cancer, depression, GERD, HLD, Paroxysmal A fib on Xarelto  who presented to the ED with significant anemia. Hgb on arrival was 5.6, with stool heme positive. GI consulted for further evaluation.   Anemia: hemoglobin 5.6 on arrival, 6.3 on 6/30, hgb was 12.2 in December 2024, no recent labs in between then and now for comparison. She notes recent increase in xarelto  from 15 to 20mg  over the past few months. She denies any associated symptoms of her anemia or any GI symptoms. Last TCS was many years ago, last EGD as above in 2012. At this time, I am recommending colonoscopy and EGD tomorrow as she warrants bidirectional endoscopic evaluation for any source of bleeding in the GI tract. Indications, risks and benefits of procedure discussed in detail with patient. Patient verbalized understanding and is in agreement to proceed with EGD and Colonoscopy on Thursday.    Last xarelto  dose Monday evening    Plan / Recommendations   Continue to Hold xarelto  Trend h&h, transfuse for hgb <7 PPI BID EGD/Colonoscopy tomorrow  Monitor for overt GI bleeding Clear liquids today, NPO midnight     06/23/2024, 9:06 AM  Clancey Welton L. Cricket Goodlin, MSN, APRN, AGNP-C Adult-Gerontology Nurse Practitioner [facility] Gastroenterology at [facility]

## 2024-06-23 NOTE — Hospital Course (Addendum)
 89 y.o. female with medical history significant of paroxysmal atrial fibrillation on Xarelto  and no other medical history who saw her cardiologist last Thursday for routine checkup, she was supposed to go for the lab check next day Friday but some reason order was not sent to the laboratory.  She went yesterday, blood was drawn, however CBC resulted today with hemoglobin under 7 so she was called by her cardiologist Dr. Okey to present to the ED.  Patient herself has no complaints at all.  She denies any nausea, vomiting, chest pain, shortness of breath, palpitation, any problem with urination or bowel bowel movements, no hematochezia, hematemesis or melena.   ED Course: Upon arrival to ED, patient fairly stable hemodynamically.  CBC showed hemoglobin of 5.6.  FOBT positive.  BMP not drawn.  2 units of PRBC transfusion ordered.  ED physician consulted GI, they recommended admission.  Hospitalist called for admission. Colonoscopy was done 06/25/24 and showed a cecal mass.  General surgery was consulted and patient underwent right hemicolectomy on 06/28/24 with liver biopsy.  Post-op, her diet was gradually advanced as her bowel function returned.  Her pain was well controlled.  She tolerated her diet.  Her electrolytes were optimized.  Her Hgb remained stable without any signs of active blood loss.

## 2024-06-24 ENCOUNTER — Encounter (HOSPITAL_COMMUNITY): Payer: Self-pay

## 2024-06-24 ENCOUNTER — Encounter (HOSPITAL_COMMUNITY)
Admission: EM | Disposition: A | Discharge status: Skilled Nursing Facility | Payer: Self-pay | Source: Home / Self Care | Attending: Family Medicine

## 2024-06-24 DIAGNOSIS — K922 Gastrointestinal hemorrhage, unspecified: Secondary | ICD-10-CM | POA: Diagnosis not present

## 2024-06-24 DIAGNOSIS — D649 Anemia, unspecified: Secondary | ICD-10-CM | POA: Diagnosis not present

## 2024-06-24 DIAGNOSIS — D500 Iron deficiency anemia secondary to blood loss (chronic): Secondary | ICD-10-CM | POA: Diagnosis not present

## 2024-06-24 DIAGNOSIS — C180 Malignant neoplasm of cecum: Secondary | ICD-10-CM | POA: Diagnosis not present

## 2024-06-24 DIAGNOSIS — R195 Other fecal abnormalities: Secondary | ICD-10-CM | POA: Diagnosis not present

## 2024-06-24 LAB — BASIC METABOLIC PANEL WITH GFR
Anion gap: 7 (ref 5–15)
BUN: 12 mg/dL (ref 8–23)
CO2: 19 mmol/L — ABNORMAL LOW (ref 22–32)
Calcium: 9.7 mg/dL (ref 8.9–10.3)
Chloride: 114 mmol/L — ABNORMAL HIGH (ref 98–111)
Creatinine, Ser: 0.64 mg/dL (ref 0.44–1.00)
GFR, Estimated: >60 mL/min (ref >60)
Glucose, Bld: 95 mg/dL (ref 70–99)
Potassium: 3.7 mmol/L (ref 3.5–5.1)
Sodium: 140 mmol/L (ref 135–145)

## 2024-06-24 LAB — CBC
HCT: 34.2 % — ABNORMAL LOW (ref 36.0–46.0)
Hemoglobin: 9.9 g/dL — ABNORMAL LOW (ref 12.0–15.0)
MCH: 22 pg — ABNORMAL LOW (ref 26.0–34.0)
MCHC: 28.9 g/dL — ABNORMAL LOW (ref 30.0–36.0)
MCV: 76.2 fL — ABNORMAL LOW (ref 80.0–100.0)
Platelets: 267 K/uL (ref 150–400)
RBC: 4.49 MIL/uL (ref 3.87–5.11)
RDW: 19.4 % — ABNORMAL HIGH (ref 11.5–15.5)
WBC: 8.2 K/uL (ref 4.0–10.5)
nRBC: 0 % (ref 0.0–0.2)

## 2024-06-24 LAB — MAGNESIUM: Magnesium: 2.1 mg/dL (ref 1.7–2.4)

## 2024-06-24 SURGERY — EGD (ESOPHAGOGASTRODUODENOSCOPY)
Anesthesia: Choice

## 2024-06-24 MED ORDER — SODIUM CHLORIDE 0.9 % IV SOLN: INTRAVENOUS | Status: AC

## 2024-06-24 MED ORDER — POLYETHYLENE GLYCOL 3350 17 G PO PACK
17.0000 g | PACK | ORAL | Status: AC
  Administered 2024-06-24 (×4): 17 g via ORAL
  Filled 2024-06-24 (×3): qty 1

## 2024-06-24 MED ORDER — BISACODYL 5 MG PO TBEC
10.0000 mg | DELAYED_RELEASE_TABLET | Freq: Once | ORAL | Status: AC
  Administered 2024-06-24: 10 mg via ORAL
  Filled 2024-06-24: qty 2

## 2024-06-24 NOTE — Plan of Care (Signed)
   Problem: Education: Goal: Knowledge of General Education information will improve Description Including pain rating scale, medication(s)/side effects and non-pharmacologic comfort measures Outcome: Progressing   Problem: Health Behavior/Discharge Planning: Goal: Ability to manage health-related needs will improve Outcome: Progressing

## 2024-06-24 NOTE — Progress Notes (Signed)
 PROGRESS NOTE   Hailey Graham  FMW:[id number] DOB: [birth year] DOA: 06/22/2024 PCP: Sheryle Carwin, MD   Chief Complaint  Patient presents with   Anemia   Level of care: Telemetry  Brief Admission History:  89 y.o. female with medical history significant of paroxysmal atrial fibrillation on Xarelto  and no other medical history who saw her cardiologist last Thursday for routine checkup, she was supposed to go for the lab check next day Friday but some reason order was not sent to the laboratory.  She went yesterday, blood was drawn, however CBC resulted today with hemoglobin under 7 so she was called by her cardiologist Dr. Okey to present to the ED.  Patient herself has no complaints at all.  She denies any nausea, vomiting, chest pain, shortness of breath, palpitation, any problem with urination or bowel bowel movements, no hematochezia, hematemesis or melena.   ED Course: Upon arrival to ED, patient fairly stable hemodynamically.  CBC showed hemoglobin of 5.6.  FOBT positive.  BMP not drawn.  2 units of PRBC transfusion ordered.  ED physician consulted GI, they recommended admission.  Hospitalist called for admission.   Assessment and Plan:  Acute blood loss anemia secondary to presumed upper GI bleed, POA: Hemoglobin 5.6.  2 units PRBC transfusion ordered by ED.  She is already receiving the first 1.  GI is consulted.  They have recommended keeping her n.p.o. from midnight.  Will start on clear liquid diet and n.p.o. midnight.  Started on Protonix  twice daily.  Repeat CBC after transfusion completed as well as in the morning and transfuse if less than 7.  Hold Xarelto .  --appreciate GI consult and plans for EGD/colonoscopy for further evaluation  -- plan is for EGD/colon tomorrow due to inadequate bowel prep   Paroxysmal atrial fibrillation: Currently in sinus rhythm.  Takes Xarelto  at home which we will hold due to presumed upper GI bleed and acute blood loss anemia.  Resume Toprol -XL for rate  control. Pt says she takes 12.5 mg twice daily which we have resumed how she takes at home    Anxiety: Resume home dose of Xanax.  History of left breast cancer -- resumed home arimedex   Hypokalemia -- IV replacement ordered -- check BMP in AM with Mg  DVT prophylaxis: SCDs Code Status: DNR  Family Communication:  Disposition anticipate home    Consultants:  GI services  Procedures:   Antimicrobials:    Subjective: Pt having a difficult time tolerating the bowel prep    Objective: Vitals:   06/23/24 1326 06/23/24 1900 06/23/24 2001 06/24/24 0355  BP: (!) 115/54  (!) 154/82 129/76  Pulse: 72  73 76  Resp:  18 20 20   Temp: 98.3 F (36.8 C)  98.6 F (37 C) 98.2 F (36.8 C)  TempSrc: Oral  Oral Oral  SpO2: 96%  99% 96%  Weight:      Height:        Intake/Output Summary (Last 24 hours) at 06/24/2024 1242 Last data filed at 06/23/2024 2311 Gross per 24 hour  Intake 480 ml  Output 700 ml  Net -220 ml   Filed Weights   06/22/24 1732 06/22/24 2047  Weight: 74 kg 74 kg   Examination:  General exam: Appears calm and comfortable  Respiratory system: Clear to auscultation. Respiratory effort normal. Cardiovascular system: normal S1 & S2 heard. No JVD, murmurs, rubs, gallops or clicks. No pedal edema. Gastrointestinal system: Abdomen is nondistended, soft and nontender. No organomegaly or masses felt.  Normal bowel sounds heard. Central nervous system: Alert and oriented. No focal neurological deficits. Extremities: Symmetric 5 x 5 power. Skin: No rashes, lesions or ulcers. Psychiatry: Judgement and insight appear normal. Mood & affect appropriate.   Data Reviewed: I have personally reviewed following labs and imaging studies  CBC: Recent Labs  Lab 06/21/24 1450 06/22/24 1410 06/23/24 0431 06/24/24 0407  WBC 7.3 4.9 7.2 8.2  HGB 6.3* 5.6* 9.1* 9.9*  HCT 23.3* 20.5* 30.7* 34.2*  MCV 73* 72.2* 74.7* 76.2*  PLT 325 247 241 267    Basic Metabolic  Panel: Recent Labs  Lab 06/21/24 1450 06/22/24 1410 06/23/24 0431 06/24/24 0407  NA 142 139 144 140  K 3.9 3.6 3.2* 3.7  CL 110* 110 114* 114*  CO2 19* 21* 19* 19*  GLUCOSE 95 99 87 95  BUN 19 21 13 12   CREATININE 0.67 0.74 0.51 0.64  CALCIUM 9.7 9.5 9.3 9.7  MG  --   --   --  2.1    CBG: No results for input(s): GLUCAP in the last 168 hours.  No results found for this or any previous visit (from the past 240 hours).   Radiology Studies: No results found.  Scheduled Meds:  anastrozole   1 mg Oral QPM   bisacodyl  10 mg Oral Once   dorzolamide-timolol  1 drop Both Eyes BID   ezetimibe   10 mg Oral Daily   And   simvastatin   20 mg Oral q1800   metoprolol  succinate  12.5 mg Oral BID   pantoprazole  (PROTONIX ) IV  40 mg Intravenous Q12H   pilocarpine  1 drop Right Eye QID   sodium chloride  flush  3 mL Intravenous Q12H   Continuous Infusions:  sodium chloride       LOS: 0 days   Time spent: 50 mins  Zakariya Knickerbocker Vicci, MD How to contact the [facility] Attending or Consulting provider 7A - 7P or covering provider during after hours 7P -7A, for this patient?  Check the care team in [facility] and look for a) attending/consulting TRH provider listed and b) the TRH team listed Log into [email or web address] to find provider on call.  Locate the TRH provider you are looking for under [facility] and page to a number that you can be directly reached. If you still have difficulty reaching the provider, please page the [facility] (Director on Call) for the Hospitalists listed on amion for assistance.  06/24/2024, 12:42 PM

## 2024-06-24 NOTE — Progress Notes (Signed)
 Gastroenterology Progress Note   Referring Provider: No ref. provider found Primary Care Physician:  Sheryle Carwin, MD Primary Gastroenterologist:  Dr. Eartha  Patient ID: Hailey Graham; [id number]; 01-Apr-1928   Subjective:    Had about 7 cups of bowel prep last night. A lot of nausea. No vomiting. Had about 5 stools, last one around 1am. Enema early this morning, reported to have brown liquid with some small solid pieces. No melena, brbpr.   Objective:   Vital signs in last 24 hours: Temp:  [98.2 F (36.8 C)-98.6 F (37 C)] 98.2 F (36.8 C) (07/03 0355) Pulse Rate:  [72-76] 76 (07/03 0355) Resp:  [18-20] 20 (07/03 0355) BP: (115-154)/(54-82) 129/76 (07/03 0355) SpO2:  [96 %-99 %] 96 % (07/03 0355) Last BM Date : 06/23/24 General:   Alert,  Well-developed, well-nourished, pleasant and cooperative in NAD Head:  Normocephalic and atraumatic. Eyes:  Sclera clear, no icterus.   Abdomen:  Soft, nontender and nondistended. No masses, hepatosplenomegaly or hernias noted. Normal bowel sounds, without guarding, and without rebound.   Extremities:  Without clubbing, deformity or edema. Neurologic:  Alert and  oriented x4;  grossly normal neurologically. Skin:  Intact without significant lesions or rashes. Psych:  Alert and cooperative. Normal mood and affect.  Intake/Output from previous day: 07/02 0701 - 07/03 0700 In: 480 [P.O.:480] Out: 1800 [Urine:1800] Intake/Output this shift: No intake/output data recorded.  Lab Results: CBC Recent Labs    06/22/24 1410 06/23/24 0431 06/24/24 0407  WBC 4.9 7.2 8.2  HGB 5.6* 9.1* 9.9*  HCT 20.5* 30.7* 34.2*  MCV 72.2* 74.7* 76.2*  PLT 247 241 267   BMET Recent Labs    06/22/24 1410 06/23/24 0431 06/24/24 0407  NA 139 144 140  K 3.6 3.2* 3.7  CL 110 114* 114*  CO2 21* 19* 19*  GLUCOSE 99 87 95  BUN 21 13 12   CREATININE 0.74 0.51 0.64  CALCIUM 9.5 9.3 9.7   LFTs Recent Labs    06/21/24 1450  BILITOT 0.3  ALKPHOS  113  AST 19  ALT 10  PROT 6.0  ALBUMIN 3.6   No results for input(s): LIPASE in the last 72 hours. PT/INR No results for input(s): LABPROT, INR in the last 72 hours.       Imaging Studies: No results found.[2 weeks]  Assessment:   Hailey Graham is a 89 y.o. year old female with history of anxiety, arthritis, breast cancer, depression, GERD, HLD, Paroxysmal A fib on Xarelto  who presented to the ED with significant anemia. Hgb on arrival was 5.6, with stool heme positive. GI consulted for further evaluation.    Anemia: hemoglobin 5.6 on arrival, 6.3 on 6/30, hgb was 12.2 in December 2024, no recent labs in between then and now for comparison. She notes recent increase in xarelto  from 15 to 20mg  over the past few months. She denies any associated symptoms of her anemia or any GI symptoms. Last TCS was many years ago, last EGD as above in 2012. Colonoscopy and EGD has been recommended, had plans for today but patient did not complete bowel prep and not adequately prepped for today. We will plan for procedures for tomorrow if she is adequately prepped. Lengthy discussion with patient, we will provide her supportive anti-emetics, allow her remainder of today to complete prep and regroup for tomorrow. I have spoken to her nurse who is aware of plan and will let me know if patient is unable to complete prep. If needed, we  will switch preps. Indications, risks and benefits of procedure discussed in detail with patient. Patient verbalized understanding and is in agreement to proceed with EGD and Colonoscopy on Thursday.     Last xarelto  dose Monday evening       Plan:   Clear liquids. NPO after midnight. Colonoscopy/EGD tomorrow with Dr. Shaaron. Complete bowel prep today, RN Lauren to keep me posted on progress.  Attending has been updated.   LOS: 0 days   Sonny RAMAN. Ezzard RIGGERS [facility] [phone] 7/3/202511:15 AM

## 2024-06-25 ENCOUNTER — Observation Stay (HOSPITAL_COMMUNITY)

## 2024-06-25 ENCOUNTER — Encounter (HOSPITAL_COMMUNITY): Payer: Self-pay | Admitting: Family Medicine

## 2024-06-25 ENCOUNTER — Encounter (HOSPITAL_COMMUNITY)
Admission: EM | Disposition: A | Discharge status: Skilled Nursing Facility | Payer: Self-pay | Source: Home / Self Care | Attending: Family Medicine

## 2024-06-25 DIAGNOSIS — K8020 Calculus of gallbladder without cholecystitis without obstruction: Secondary | ICD-10-CM | POA: Diagnosis not present

## 2024-06-25 DIAGNOSIS — I700 Atherosclerosis of aorta: Secondary | ICD-10-CM | POA: Diagnosis not present

## 2024-06-25 DIAGNOSIS — D123 Benign neoplasm of transverse colon: Secondary | ICD-10-CM | POA: Diagnosis not present

## 2024-06-25 DIAGNOSIS — D500 Iron deficiency anemia secondary to blood loss (chronic): Secondary | ICD-10-CM | POA: Diagnosis not present

## 2024-06-25 DIAGNOSIS — K6389 Other specified diseases of intestine: Secondary | ICD-10-CM | POA: Diagnosis not present

## 2024-06-25 DIAGNOSIS — K269 Duodenal ulcer, unspecified as acute or chronic, without hemorrhage or perforation: Secondary | ICD-10-CM

## 2024-06-25 DIAGNOSIS — Z853 Personal history of malignant neoplasm of breast: Secondary | ICD-10-CM | POA: Diagnosis not present

## 2024-06-25 DIAGNOSIS — J9811 Atelectasis: Secondary | ICD-10-CM | POA: Diagnosis not present

## 2024-06-25 DIAGNOSIS — C180 Malignant neoplasm of cecum: Secondary | ICD-10-CM

## 2024-06-25 DIAGNOSIS — C19 Malignant neoplasm of rectosigmoid junction: Secondary | ICD-10-CM | POA: Diagnosis not present

## 2024-06-25 DIAGNOSIS — K3189 Other diseases of stomach and duodenum: Secondary | ICD-10-CM | POA: Diagnosis not present

## 2024-06-25 DIAGNOSIS — R195 Other fecal abnormalities: Secondary | ICD-10-CM | POA: Diagnosis not present

## 2024-06-25 DIAGNOSIS — K254 Chronic or unspecified gastric ulcer with hemorrhage: Secondary | ICD-10-CM

## 2024-06-25 DIAGNOSIS — K5730 Diverticulosis of large intestine without perforation or abscess without bleeding: Secondary | ICD-10-CM | POA: Diagnosis not present

## 2024-06-25 DIAGNOSIS — D120 Benign neoplasm of cecum: Secondary | ICD-10-CM | POA: Diagnosis not present

## 2024-06-25 DIAGNOSIS — K449 Diaphragmatic hernia without obstruction or gangrene: Secondary | ICD-10-CM | POA: Diagnosis not present

## 2024-06-25 DIAGNOSIS — K2970 Gastritis, unspecified, without bleeding: Secondary | ICD-10-CM | POA: Diagnosis not present

## 2024-06-25 DIAGNOSIS — M7989 Other specified soft tissue disorders: Secondary | ICD-10-CM | POA: Diagnosis not present

## 2024-06-25 DIAGNOSIS — K922 Gastrointestinal hemorrhage, unspecified: Secondary | ICD-10-CM | POA: Diagnosis not present

## 2024-06-25 DIAGNOSIS — M1711 Unilateral primary osteoarthritis, right knee: Secondary | ICD-10-CM | POA: Diagnosis not present

## 2024-06-25 DIAGNOSIS — K259 Gastric ulcer, unspecified as acute or chronic, without hemorrhage or perforation: Secondary | ICD-10-CM | POA: Diagnosis not present

## 2024-06-25 DIAGNOSIS — K639 Disease of intestine, unspecified: Secondary | ICD-10-CM | POA: Diagnosis not present

## 2024-06-25 DIAGNOSIS — M25561 Pain in right knee: Secondary | ICD-10-CM | POA: Diagnosis not present

## 2024-06-25 HISTORY — PX: COLONOSCOPY: SHX5424

## 2024-06-25 HISTORY — PX: ESOPHAGOGASTRODUODENOSCOPY: SHX5428

## 2024-06-25 LAB — BASIC METABOLIC PANEL WITH GFR
Anion gap: 6 (ref 5–15)
BUN: 10 mg/dL (ref 8–23)
CO2: 20 mmol/L — ABNORMAL LOW (ref 22–32)
Calcium: 9.2 mg/dL (ref 8.9–10.3)
Chloride: 110 mmol/L (ref 98–111)
Creatinine, Ser: 0.6 mg/dL (ref 0.44–1.00)
GFR, Estimated: >60 mL/min (ref >60)
Glucose, Bld: 97 mg/dL (ref 70–99)
Potassium: 3.3 mmol/L — ABNORMAL LOW (ref 3.5–5.1)
Sodium: 136 mmol/L (ref 135–145)

## 2024-06-25 LAB — CBC
HCT: 34.7 % — ABNORMAL LOW (ref 36.0–46.0)
Hemoglobin: 10.5 g/dL — ABNORMAL LOW (ref 12.0–15.0)
MCH: 23 pg — ABNORMAL LOW (ref 26.0–34.0)
MCHC: 30.3 g/dL (ref 30.0–36.0)
MCV: 76.1 fL — ABNORMAL LOW (ref 80.0–100.0)
Platelets: 264 K/uL (ref 150–400)
RBC: 4.56 MIL/uL (ref 3.87–5.11)
RDW: 20.9 % — ABNORMAL HIGH (ref 11.5–15.5)
WBC: 9.1 K/uL (ref 4.0–10.5)
nRBC: 0 % (ref 0.0–0.2)

## 2024-06-25 LAB — URIC ACID: Uric Acid, Serum: 4.1 mg/dL (ref 2.5–7.1)

## 2024-06-25 SURGERY — COLONOSCOPY
Anesthesia: Monitor Anesthesia Care

## 2024-06-25 MED ORDER — LACTATED RINGERS IV SOLN: INTRAVENOUS | Status: DC

## 2024-06-25 MED ORDER — FENTANYL CITRATE (PF) 100 MCG/2ML IJ SOLN
INTRAMUSCULAR | Status: DC | PRN
  Administered 2024-06-25 (×4): 25 ug via INTRAVENOUS

## 2024-06-25 MED ORDER — POTASSIUM CHLORIDE CRYS ER 20 MEQ PO TBCR
40.0000 meq | EXTENDED_RELEASE_TABLET | Freq: Every day | ORAL | Status: AC
  Administered 2024-06-25: 40 meq via ORAL
  Filled 2024-06-25: qty 2

## 2024-06-25 MED ORDER — ONDANSETRON HCL 4 MG/2ML IJ SOLN
INTRAMUSCULAR | Status: AC
  Filled 2024-06-25: qty 2

## 2024-06-25 MED ORDER — OXYCODONE HCL 5 MG PO TABS
5.0000 mg | ORAL_TABLET | Freq: Four times a day (QID) | ORAL | Status: DC | PRN
  Administered 2024-06-25 – 2024-06-29 (×2): 5 mg via ORAL
  Filled 2024-06-25 (×5): qty 1

## 2024-06-25 MED ORDER — SPOT INK MARKER SYRINGE KIT
PACK | SUBMUCOSAL | Status: DC | PRN
  Administered 2024-06-25: 1 mL via SUBMUCOSAL

## 2024-06-25 MED ORDER — FENTANYL CITRATE (PF) 100 MCG/2ML IJ SOLN
INTRAMUSCULAR | Status: AC
  Filled 2024-06-25: qty 2

## 2024-06-25 MED ORDER — IOHEXOL 300 MG/ML  SOLN
100.0000 mL | Freq: Once | INTRAMUSCULAR | Status: AC | PRN
Start: 2024-06-25 — End: 2024-06-25
  Administered 2024-06-25: 100 mL via INTRAVENOUS

## 2024-06-25 MED ORDER — MIDAZOLAM HCL 2 MG/2ML IJ SOLN
INTRAMUSCULAR | Status: AC
  Filled 2024-06-25: qty 2

## 2024-06-25 MED ORDER — SODIUM CHLORIDE 0.9 % IV SOLN: INTRAVENOUS | Status: DC

## 2024-06-25 MED ORDER — LACTATED RINGERS IV SOLN: INTRAVENOUS | Status: DC | PRN

## 2024-06-25 MED ORDER — MIDAZOLAM HCL 2 MG/2ML IJ SOLN
INTRAMUSCULAR | Status: DC | PRN
  Administered 2024-06-25 (×2): 1 mg via INTRAVENOUS

## 2024-06-25 MED ORDER — PROPOFOL 10 MG/ML IV BOLUS
INTRAVENOUS | Status: DC | PRN
  Administered 2024-06-25: 20 mg via INTRAVENOUS

## 2024-06-25 MED ORDER — DICLOFENAC SODIUM 1 % EX GEL
2.0000 g | Freq: Four times a day (QID) | CUTANEOUS | Status: DC | PRN
  Administered 2024-06-25: 2 g via TOPICAL
  Filled 2024-06-25: qty 100

## 2024-06-25 NOTE — Op Note (Signed)
 [facility] Patient Name: Hailey Graham Procedure Date: 06/25/2024 8:56 AM MRN: [id number] Date of Birth: Dec 18, 1928 Attending MD: Lamar Ozell Hollingshead , MD, [id number] CSN: [id number] Age: 89 Admit Type: Inpatient Procedure:                Upper GI endoscopy Indications:              Heme positive stool Providers:                Lamar Ozell Hollingshead, MD, Jon LABOR. Gerome RN, RN,                            Gordy Bruckner Tech, Technician Referring MD:              Medicines:                Propofol  per Anesthesia Complications:            No immediate complications. Estimated Blood Loss:     Estimated blood loss was minimal. Procedure:                Pre-Anesthesia Assessment:                           - Prior to the procedure, a History and Physical                            was performed, and patient medications and                            allergies were reviewed. The patient's tolerance of                            previous anesthesia was also reviewed. The risks                            and benefits of the procedure and the sedation                            options and risks were discussed with the patient.                            All questions were answered, and informed consent                            was obtained. Prior Anticoagulants: The patient                            last took Eliquis (apixaban) 3 days prior to the                            procedure. ASA Grade Assessment: III - A patient                            with severe systemic disease. After reviewing the  risks and benefits, the patient was deemed in                            satisfactory condition to undergo the procedure.                           After obtaining informed consent, the endoscope was                            passed under direct vision. Throughout the                            procedure, the patient's blood pressure, pulse, and                             oxygen saturations were monitored continuously. The                            [id number]-H190 ([id number]) scope was introduced through the                            mouth, and advanced to the third part of duodenum.                            The upper GI endoscopy was accomplished without                            difficulty. The patient tolerated the procedure                            well. Scope In: 9:33:08 AM Scope Out: 9:38:27 AM Total Procedure Duration: 0 hours 5 minutes 19 seconds  Findings:      The examined esophagus was normal.      A medium-sized hiatal hernia was present. Gastric cavity empty. Numerous       hemorrhagic erosions involving the antrum and the body. Fairly intense       patchy erythema in between erosions. No frank ulcer. No infiltrating       process seen. No blood in the upper GI tract patent pylorus      Examination bulb, second third portion of duodenum revealed erosions in       the bulb otherwise, the mucosa appeared normal      Biopsies of the gastric antrum and body taken for histologic study Impression:               - Normal esophagus. Numerous hemorrhagic gastric                            erosions with fairly intense gastric                            erythema?"status post gastric biopsy                           - Medium-sized hiatal hernia.                           -  Bulbar erosions.                           - In the setting of anticoagulation, inflammatory                            changes found in the stomach today could result in                            a clinically significant anemia. Moderate Sedation:      Moderate (conscious) sedation was personally administered by an       anesthesia professional. The following parameters were monitored: oxygen       saturation, heart rate, blood pressure, respiratory rate, EKG, adequacy       of pulmonary ventilation, and response to care. Recommendation:           - Patient has a contact  number available for                            emergencies. The signs and symptoms of potential                            delayed complications were discussed with the                            patient. Return to normal activities tomorrow.                            Written discharge instructions were provided to the                            patient.                           - Clear liquid diet.                           - Return patient to hospital ward for ongoing care.                            See colonoscopy report. Procedure Code(s):        --- Professional ---                           [phone], Esophagogastroduodenoscopy, flexible,                            transoral; diagnostic, including collection of                            specimen(s) by brushing or washing, when performed                            (separate procedure) Diagnosis Code(s):        --- Professional ---  K44.9, Diaphragmatic hernia without obstruction or                            gangrene                           R19.5, Other fecal abnormalities CPT copyright 2022 [employer] Association. All rights reserved. The codes documented in this report are preliminary and upon coder review may  be revised to meet current compliance requirements. Lamar HERO. Layna Roeper, MD Lamar Ozell Hollingshead, MD 06/25/2024 10:23:02 AM This report has been signed electronically. Number of Addenda: 0

## 2024-06-25 NOTE — Progress Notes (Signed)
 PROGRESS NOTE   Hailey Graham  FMW:[id number] DOB: 1928-09-17 DOA: 06/22/2024 PCP: Sheryle Carwin, MD   Chief Complaint  Patient presents with   Anemia   Level of care: Telemetry  Brief Admission History:  89 y.o. female with medical history significant of paroxysmal atrial fibrillation on Xarelto  and no other medical history who saw her cardiologist last Thursday for routine checkup, she was supposed to go for the lab check next day Friday but some reason order was not sent to the laboratory.  She went yesterday, blood was drawn, however CBC resulted today with hemoglobin under 7 so she was called by her cardiologist Dr. Okey to present to the ED.  Patient herself has no complaints at all.  She denies any nausea, vomiting, chest pain, shortness of breath, palpitation, any problem with urination or bowel bowel movements, no hematochezia, hematemesis or melena.   ED Course: Upon arrival to ED, patient fairly stable hemodynamically.  CBC showed hemoglobin of 5.6.  FOBT positive.  BMP not drawn.  2 units of PRBC transfusion ordered.  ED physician consulted GI, they recommended admission.  Hospitalist called for admission.   Assessment and Plan:  Acute blood loss anemia secondary to presumed upper GI bleed, POA: Hemoglobin 5.6.  2 units PRBC transfusion ordered by ED.  She is already receiving the first 1.  GI is consulted.  She is going for EGD/colon today.  Holding Xarelto .  --appreciate GI consult and plans for EGD/colonoscopy for further evaluation  -- plan is for EGD/colon today   Paroxysmal atrial fibrillation: Currently in sinus rhythm.  Resumed home Toprol -XL for rate control. Pt says she takes 12.5 mg twice daily which we have resumed how she takes at home    Anxiety: Resumed home dose of Xanax .  History of left breast cancer -- resumed home arimedex   Hypokalemia -- oral dose replacement ordered -- check BMP in AM  DVT prophylaxis: SCDs Code Status: DNR  Family Communication:   Disposition anticipate home tomorrow if stable    Consultants:  GI services  Procedures:   Antimicrobials:    Subjective: Pt says she was able to complete the bowel prep; she is wanting to go forward with procedure today     Objective: Vitals:   06/24/24 1941 06/25/24 0555 06/25/24 0915 06/25/24 1026  BP: (!) 161/94 137/86 134/87   Pulse:  88 91 85  Resp:  (!) 22 17 18   Temp: 98.3 F (36.8 C) 99.6 F (37.6 C) 98 F (36.7 C)   TempSrc: Oral Oral Oral   SpO2: 95% 98% 97% 100%  Weight:      Height:       No intake or output data in the 24 hours ending 06/25/24 1028  Filed Weights   06/22/24 1732 06/22/24 2047  Weight: 74 kg 74 kg   Examination:  General exam: Appears calm and comfortable  Respiratory system: Clear to auscultation. Respiratory effort normal. Cardiovascular system: normal S1 & S2 heard. No JVD, murmurs, rubs, gallops or clicks. No pedal edema. Gastrointestinal system: Abdomen is nondistended, soft and nontender. No organomegaly or masses felt. Normal bowel sounds heard. Central nervous system: Alert and oriented. No focal neurological deficits. Extremities: Symmetric 5 x 5 power. Skin: No rashes, lesions or ulcers. Psychiatry: Judgement and insight appear normal. Mood & affect appropriate.   Data Reviewed: I have personally reviewed following labs and imaging studies  CBC: Recent Labs  Lab 06/21/24 1450 06/22/24 1410 06/23/24 0431 06/24/24 0407 06/25/24 0435  WBC  7.3 4.9 7.2 8.2 9.1  HGB 6.3* 5.6* 9.1* 9.9* 10.5*  HCT 23.3* 20.5* 30.7* 34.2* 34.7*  MCV 73* 72.2* 74.7* 76.2* 76.1*  PLT 325 247 241 267 264    Basic Metabolic Panel: Recent Labs  Lab 06/21/24 1450 06/22/24 1410 06/23/24 0431 06/24/24 0407 06/25/24 0435  NA 142 139 144 140 136  K 3.9 3.6 3.2* 3.7 3.3*  CL 110* 110 114* 114* 110  CO2 19* 21* 19* 19* 20*  GLUCOSE 95 99 87 95 97  BUN 19 21 13 12 10   CREATININE 0.67 0.74 0.51 0.64 0.60  CALCIUM 9.7 9.5 9.3 9.7 9.2  MG   --   --   --  2.1  --     CBG: No results for input(s): GLUCAP in the last 168 hours.  No results found for this or any previous visit (from the past 240 hours).   Radiology Studies: No results found.  Scheduled Meds:  [MAR Hold] anastrozole   1 mg Oral QPM   [MAR Hold] dorzolamide -timolol   1 drop Both Eyes BID   [MAR Hold] ezetimibe   10 mg Oral Daily   And   [MAR Hold] simvastatin   20 mg Oral q1800   [MAR Hold] metoprolol  succinate  12.5 mg Oral BID   [MAR Hold] pantoprazole  (PROTONIX ) IV  40 mg Intravenous Q12H   [MAR Hold] pilocarpine   1 drop Right Eye QID   [MAR Hold] sodium chloride  flush  3 mL Intravenous Q12H   Continuous Infusions:  sodium chloride      sodium chloride      lactated ringers       LOS: 0 days   Time spent: 48 mins  Geral Coker Vicci, MD How to contact the [facility] Attending or Consulting provider 7A - 7P or covering provider during after hours 7P -7A, for this patient?  Check the care team in [facility] and look for a) attending/consulting TRH provider listed and b) the TRH team listed Log into [email or web address] to find provider on call.  Locate the TRH provider you are looking for under [facility] and page to a number that you can be directly reached. If you still have difficulty reaching the provider, please page the [facility] (Director on Call) for the Hospitalists listed on amion for assistance.  06/25/2024, 10:28 AM

## 2024-06-25 NOTE — Plan of Care (Signed)

## 2024-06-25 NOTE — Progress Notes (Signed)
 Dr. Kallie at the bedside in PACU, spoke with patient about options regarding the mass found during her colonoscopy.  She went to speak to the niece in the waiting room, but niece had already went back upstairs to the patients room.  Patient alert and oriented, understood Dr. Kallie well.

## 2024-06-25 NOTE — Consult Note (Signed)
 [facility] Consult  Reason for Consult: Cecal Mass  Referring Physician: Dr. Shaaron   Chief Complaint   Anemia     HPI: Hailey Graham is a 89 y.o. female with a new Cecal mass noted on colonoscopy today. .The patient was sent from outpatient to the hospital after being found to be anemic to 5.6. She was hemoccult positive, and she has received blood. She reports no symptoms or signs that she had this going on. She reports she lives alone, drives, and cooks for herself. Her niece Donny Monte is her POA and she helps her make decisions.   She is aware of the mass.   Past Medical History:  Diagnosis Date   Allergy    Anxiety    Arthritis    Breast cancer of upper-outer quadrant of left female breast (HCC) 11/24/2015   Cataract    Complication of anesthesia    Depression    Diverticulosis    Gait abnormality    Gastroesophageal reflux disease    Glaucoma    Hyperlipidemia    Lipid profile in 05/2010:149, 86, 48, 84   Lung nodule    Right lower lobe; stable in 2010   Paroxysmal atrial fibrillation (HCC)    Rate related right bundle branch block; normal EF on echo in 2006; normal stress nuclear-2006; mild RV hypokinesis on MRI;  RA and RV enlargement on CT in 9/06; Anticoagulation->discontinued in 2009   Personal history of kidney stones    PONV (postoperative nausea and vomiting)    Nausea    Past Surgical History:  Procedure Laterality Date   ABDOMINAL HYSTERECTOMY  2202   ANKLE SURGERY Right 12/23/2001   Fracture- rod   APPENDECTOMY     as a teenager   BREAST LUMPECTOMY Left 2016   invasive ductal ca   BREAST LUMPECTOMY WITH RADIOACTIVE SEED LOCALIZATION Left 12/13/2015   Procedure: BREAST LUMPECTOMY WITH RADIOACTIVE SEED LOCALIZATION;  Surgeon: Morene Olives, MD;  Location: [facility] OR;  Service: General;  Laterality: Left;   CATARACT EXTRACTION W/ INTRAOCULAR LENS IMPLANT Right 12/23/2012   COLONOSCOPY W/ POLYPECTOMY     ESOPHAGOGASTRODUODENOSCOPY      GLAUCOMA SURGERY Right 12/23/2012   HERNIA REPAIR Right    1990's   ORIF ANKLE FRACTURE  12/23/2001   Right   TONSILLECTOMY     age 47   Varicose veins Right 2207    Family History  Problem Relation Age of Onset   Healthy Mother    Cerebral aneurysm Father    Lung cancer Sister     Social History   Tobacco Use   Smoking status: Never   Smokeless tobacco: Never  Vaping Use   Vaping status: Never Used  Substance Use Topics   Alcohol use: No    Alcohol/week: 0.0 standard drinks of alcohol   Drug use: No    Medications: I have reviewed the patient's current medications. Prior to Admission:  Medications Prior to Admission  Medication Sig Dispense Refill Last Dose/Taking   acetaminophen  (TYLENOL ) 500 MG tablet Take 1,000 mg by mouth as needed for mild pain (pain score 1-3), moderate pain (pain score 4-6), fever or headache.   06/22/2024 at  1:30 AM   ALPRAZolam  (XANAX ) 0.25 MG tablet Take 0.25 mg by mouth daily as needed for anxiety.   Past Week   anastrozole  (ARIMIDEX ) 1 MG tablet Take 1 tablet (1 mg total) by mouth daily. 90 tablet 3 06/21/2024 Evening   Cholecalciferol (VITAMIN D ) 2000 UNITS tablet Take  2,000 Units by mouth daily.   06/21/2024 Bedtime   Cyanocobalamin (B-12) 1000 MCG TABS Take 1 tablet by mouth daily.   06/21/2024 Morning   dorzolamide -timolol  (COSOPT ) 22.3-6.8 MG/ML ophthalmic solution Place 1 drop into both eyes 2 (two) times daily.   06/22/2024 Morning   ezetimibe -simvastatin  (VYTORIN ) 10-20 MG tablet Take 1 tablet by mouth daily. 90 tablet 2 06/21/2024 Evening   latanoprost  (XALATAN ) 0.005 % ophthalmic solution Place 1 drop into both eyes at bedtime.   06/21/2024 Bedtime   metoprolol  succinate (TOPROL -XL) 25 MG 24 hr tablet TAKE (1/2) TABLET BY MOUTH TWICE DAILY (Patient taking differently: Take 12.5 mg by mouth in the morning and at bedtime.) 90 tablet 2 06/22/2024 Morning   pilocarpine  (PILOCAR) 1 % ophthalmic solution Place 1 drop into the right eye 4 times  daily.   06/22/2024 Morning   rivaroxaban  (XARELTO ) 20 MG TABS tablet Take 20 mg by mouth daily with supper.   06/21/2024 at  6:30 PM   Scheduled:  anastrozole   1 mg Oral QPM   dorzolamide -timolol   1 drop Both Eyes BID   ezetimibe   10 mg Oral Daily   And   simvastatin   20 mg Oral q1800   metoprolol  succinate  12.5 mg Oral BID   pantoprazole  (PROTONIX ) IV  40 mg Intravenous Q12H   pilocarpine   1 drop Right Eye QID   sodium chloride  flush  3 mL Intravenous Q12H   Continuous: PRN:acetaminophen , ALPRAZolam , diclofenac  Sodium, ondansetron  **OR** ondansetron  (ZOFRAN ) IV, oxyCODONE   Allergies  Allergen Reactions   Brimonidine Other (See Comments)    Crusting - difficulty opening eyes   Sulfa Drugs Cross Reactors Other (See Comments)    Unknown    Codeine Nausea Only   Morphine Nausea Only   Sulfamethoxazole Nausea Only     ROS:  A comprehensive review of systems was negative except for: Hematologic/lymphatic: positive for anemia, no reported blood per rectum  Blood pressure (!) 140/83, pulse 90, temperature 98.2 F (36.8 C), temperature source Oral, resp. rate 19, height 5' 6 (1.676 m), weight 74 kg, SpO2 100%. Physical Exam Vitals reviewed.  HENT:     Head: Normocephalic.     Mouth/Throat:     Mouth: Mucous membranes are moist.  Eyes:     Extraocular Movements: Extraocular movements intact.  Cardiovascular:     Rate and Rhythm: Normal rate.  Abdominal:     General: There is no distension.     Palpations: Abdomen is soft.     Tenderness: There is no abdominal tenderness.  Musculoskeletal:        General: Normal range of motion.  Skin:    General: Skin is warm.  Neurological:     General: No focal deficit present.     Mental Status: She is alert.  Psychiatric:        Mood and Affect: Mood normal.        Behavior: Behavior normal.     Results: Results for orders placed or performed during the hospital encounter of 06/22/24 (from the past 48 hours)  CBC     Status:  Abnormal   Collection Time: 06/24/24  4:07 AM  Result Value Ref Range   WBC 8.2 4.0 - 10.5 K/uL   RBC 4.49 3.87 - 5.11 MIL/uL   Hemoglobin 9.9 (L) 12.0 - 15.0 g/dL   HCT 65.7 (L) 63.9 - 53.9 %   MCV 76.2 (L) 80.0 - 100.0 fL   MCH 22.0 (L) 26.0 - 34.0 pg   MCHC  28.9 (L) 30.0 - 36.0 g/dL   RDW 80.5 (H) 88.4 - 84.4 %   Platelets 267 150 - 400 K/uL    Comment: REPEATED TO VERIFY   nRBC 0.0 0.0 - 0.2 %    Comment: Performed at [facility], [address] [place], [place] [address]  Basic metabolic panel with GFR     Status: Abnormal   Collection Time: 06/24/24  4:07 AM  Result Value Ref Range   Sodium 140 135 - 145 mmol/L   Potassium 3.7 3.5 - 5.1 mmol/L   Chloride 114 (H) 98 - 111 mmol/L   CO2 19 (L) 22 - 32 mmol/L   Glucose, Bld 95 70 - 99 mg/dL    Comment: Glucose reference range applies only to samples taken after fasting for at least 8 hours.   BUN 12 8 - 23 mg/dL   Creatinine, Ser 9.35 0.44 - 1.00 mg/dL   Calcium 9.7 8.9 - 89.6 mg/dL   GFR, Estimated >39 >39 mL/min    Comment: (NOTE) Calculated using the CKD-EPI Creatinine Equation (2021)    Anion gap 7 5 - 15    Comment: Performed at [facility], [address] [place], [place] [address]  Magnesium     Status: None   Collection Time: 06/24/24  4:07 AM  Result Value Ref Range   Magnesium 2.1 1.7 - 2.4 mg/dL    Comment: Performed at [facility], [address], [place], [place] [address]  CBC     Status: Abnormal   Collection Time: 06/25/24  4:35 AM  Result Value Ref Range   WBC 9.1 4.0 - 10.5 K/uL   RBC 4.56 3.87 - 5.11 MIL/uL   Hemoglobin 10.5 (L) 12.0 - 15.0 g/dL   HCT 65.2 (L) 63.9 - 53.9 %   MCV 76.1 (L) 80.0 - 100.0 fL   MCH 23.0 (L) 26.0 - 34.0 pg   MCHC 30.3 30.0 - 36.0 g/dL   RDW 79.0 (H) 88.4 - 84.4 %   Platelets 264 150 - 400 K/uL   nRBC 0.0 0.0 - 0.2 %    Comment: Performed at [facility], [address], [place], [place] [address]  Basic metabolic panel with GFR     Status: Abnormal   Collection  Time: 06/25/24  4:35 AM  Result Value Ref Range   Sodium 136 135 - 145 mmol/L   Potassium 3.3 (L) 3.5 - 5.1 mmol/L   Chloride 110 98 - 111 mmol/L   CO2 20 (L) 22 - 32 mmol/L   Glucose, Bld 97 70 - 99 mg/dL    Comment: Glucose reference range applies only to samples taken after fasting for at least 8 hours.   BUN 10 8 - 23 mg/dL   Creatinine, Ser 9.39 0.44 - 1.00 mg/dL   Calcium 9.2 8.9 - 89.6 mg/dL   GFR, Estimated >39 >39 mL/min    Comment: (NOTE) Calculated using the CKD-EPI Creatinine Equation (2021)    Anion gap 6 5 - 15    Comment: Performed at [facility], [address], [place], [place] [address]  Uric acid     Status: None   Collection Time: 06/25/24  4:30 PM  Result Value Ref Range   Uric Acid, Serum 4.1 2.5 - 7.1 mg/dL    Comment: Performed at [facility], [address], [place], [place] [address]   Personally reviewed - thickened cecum lesion in the liver  DG Knee 1-2 Views Right Result Date: 06/25/2024 CLINICAL DATA:  Right  knee pain, swelling EXAM: RIGHT KNEE - 1-2 VIEW COMPARISON:  03/30/2020 FINDINGS: Moderate tricompartment osteoarthritis with joint space narrowing and spurring, most pronounced in the lateral compartment. No joint effusion. No acute bony abnormality. Specifically, no fracture, subluxation, or dislocation. IMPRESSION: Moderate tricompartment osteoarthritis. No acute bony abnormality. Electronically Signed   By: Franky Crease M.D.   On: 06/25/2024 16:41   CT ABDOMEN PELVIS W CONTRAST Result Date: 06/25/2024 CLINICAL DATA:  Colorectal carcinoma. Mass on colonoscopy. Anemia. Staging exam. * Tracking Code: BO * EXAM: CT ABDOMEN AND PELVIS WITH CONTRAST TECHNIQUE: Multidetector CT imaging of the abdomen and pelvis was performed using the standard protocol following bolus administration of intravenous contrast. RADIATION DOSE REDUCTION: This exam was performed according to the departmental dose-optimization program which includes automated exposure control,  adjustment of the mA and/or kV according to patient size and/or use of iterative reconstruction technique. CONTRAST:  OMNIPAQUE  IOHEXOL  300 MG/ML  SOLN COMPARISON:  Contrast CT 01/25/2011 FINDINGS: Lower chest: Lung bases are clear. Hepatobiliary: Hypoenhancing mass in the lateral aspect of the LEFT hepatic lobe measures 3.9 x 3.1 cm. This lesion was not present on contrast CT from 01/25/2011. Small gallstones in the gallbladder. No biliary duct dilatation Pancreas: Pancreas is normal. No ductal dilatation. No pancreatic inflammation. Spleen: Normal spleen Adrenals/urinary tract: Adrenal glands and kidneys are normal. The ureters and bladder normal. Stomach/Bowel: Stomach, duodenum and small bowel are normal. Circumferential thickening in the cecum (image 46/2). No discrete measurable mass. Appendix not identified. Ascending, transverse descending colon. Multiple diverticula through the sigmoid colon. No obstructing mass lesion identified. No lymphadenopathy within the sigmoid mesocolon. There is enlarged lymph node in ileocecal mesentery just ventral to the RIGHT psoas muscle measuring 19 mm x 10 mm on image 46/2. Vascular/Lymphatic: Abdominal aorta is normal caliber. No periportal or retroperitoneal adenopathy. No pelvic adenopathy. Reproductive: Post hysterectomy.  Adnexa unremarkable Other: No free fluid. Musculoskeletal: No aggressive osseous lesion. IMPRESSION: 1. Circumferential thickening in the cecum concerning for primary colon adenocarcinoma given the endoscopy findings. 2. Enlarged lymph node in the ileocecal mesentery concerning for local nodal metastasis. 3. Hypoenhancing mass in the LEFT hepatic lobe concerning for hepatic metastasis. 4. Cholelithiasis without evidence cholecystitis. 5. Sigmoid diverticulosis without evidence diverticulitis. Electronically Signed   By: Jackquline Boxer M.D.   On: 06/25/2024 15:46   DG Chest Port 1 View Result Date: 06/25/2024 CLINICAL DATA:  Colon mass.   History of breast cancer. EXAM: PORTABLE CHEST 1 VIEW COMPARISON:  10/01/2021. FINDINGS: The heart size and mediastinal contours are within normal limits. There is atherosclerotic calcification aorta. Mild atelectasis is noted at the lung bases bilaterally. No effusion or pneumothorax is seen. Surgical clips are noted in the left breast. No acute osseous abnormality. IMPRESSION: Mild atelectasis at the lung bases. Electronically Signed   By: Leita Birmingham M.D.   On: 06/25/2024 15:40     Assessment & Plan:  RICKI VANHANDEL is a 89 y.o. female with a new cecal cancer diagnosis. She is elderly but is still very independent and healthy. She has already done a bowel preparation. I discussed right hemicolectomy with her and her niece and open procedure to cut down on anesthesia time. Discussed doing this on Monday and risk of bleeding, infection, anastomotic leak, risk of obstruction if nothing is done. Discussed that the stage would not be known until imaging is done and lymph nodes removed.   Discussed that we would get  CT scan and can further discuss her options. If we  did surgery Monday she would not have to complete another mechanical prep. Discussed antibiotic prep prior to surgery. Discussed post operative course and potential need for SNF.   CT results back after I saw patient- concern for liver lesion and lymph node. Will notify them tomorrow and discuss more.   All questions were answered to the satisfaction of the patient and family.   Manuelita JAYSON Pander 06/25/2024, 5:46 PM

## 2024-06-25 NOTE — Anesthesia Preprocedure Evaluation (Addendum)
 Anesthesia Evaluation  Patient identified by MRN, date of birth, ID band Patient awake    Reviewed: Allergy & Precautions, H&P , NPO status , Patient's Chart, lab work & pertinent test results  History of Anesthesia Complications (+) PONV and history of anesthetic complications  Airway        Dental   Pulmonary neg pulmonary ROS          Cardiovascular negative cardio ROS      Neuro/Psych  PSYCHIATRIC DISORDERS Anxiety Depression    negative neurological ROS  negative psych ROS   GI/Hepatic negative GI ROS, Neg liver ROS,GERD  ,,  Endo/Other  negative endocrine ROS    Renal/GU negative Renal ROS  negative genitourinary   Musculoskeletal negative musculoskeletal ROS (+) Arthritis ,    Abdominal   Peds negative pediatric ROS (+)  Hematology negative hematology ROS (+) Blood dyscrasia, anemia   Anesthesia Other Findings   Reproductive/Obstetrics negative OB ROS                              Anesthesia Physical Anesthesia Plan  ASA: 3 and emergent  Anesthesia Plan: MAC   Post-op Pain Management:    Induction:   PONV Risk Score and Plan: Ondansetron  and Treatment may vary due to age or medical condition  Airway Management Planned: Nasal Cannula  Additional Equipment:   Intra-op Plan:   Post-operative Plan:   Informed Consent: I have reviewed the patients History and Physical, chart, labs and discussed the procedure including the risks, benefits and alternatives for the proposed anesthesia with the patient or authorized representative who has indicated his/her understanding and acceptance.     Dental advisory given  Plan Discussed with: Surgeon  Anesthesia Plan Comments:         Anesthesia Quick Evaluation

## 2024-06-25 NOTE — Progress Notes (Signed)
 Patients right knee has swelling, warm to touch and painful. Rates the pain an 8 out of 10. Patient has no discoloration to the area and has not hit it on anything. Will reach out to the provider for something stronger than Tylenol  500mg  for pain.

## 2024-06-25 NOTE — Op Note (Signed)
 [facility] Patient Name: Hailey Graham Procedure Date: 06/25/2024 8:55 AM MRN: [id number] Date of Birth: Jan 28, 1928 Attending MD: Lamar Ozell Hollingshead , MD, [id number] CSN: [id number] Age: 89 Admit Type: Inpatient Procedure:                Colonoscopy Indications:              Heme positive stool Providers:                Lamar Ozell Hollingshead, MD, Jon LABOR. Gerome RN, RN,                            Gordy Bruckner Tech, Technician Referring MD:              Medicines:                Propofol  per Anesthesia Complications:            No immediate complications. Estimated Blood Loss:     Estimated blood loss was minimal. Procedure:                After obtaining informed consent, the colonoscope                            was passed under direct vision. Throughout the                            procedure, the patient's blood pressure, pulse, and                            oxygen saturations were monitored continuously. The                            [id number] scope was introduced through                            the anus and advanced to the the cecum, identified                            by appendiceal orifice and ileocecal valve. The                            colonoscopy was performed with moderate difficulty                            due to multiple diverticula in the colon.                            Successful completion of the procedure was aided by                            using manual pressure. The patient tolerated the                            procedure well. The quality of the bowel  preparation was adequate. The ileocecal valve,                            appendiceal orifice, and rectum were photographed. Scope In: 9:44:26 AM Scope Out: 10:20:26 AM Scope Withdrawal Time: 0 hours 12 minutes 10 seconds  Total Procedure Duration: 0 hours 36 minutes 0 seconds  Findings:      The perianal and digital rectal examinations were  normal. This lady had       a stiff left colon with massive diverticula. Water immersion required to       slowly advance. Once the left colon was passed the remainder of the more       upstream colon was markedly redundant requiring external abdominal       pressure      Many large-mouthed and medium-mouthed diverticula were found in the       sigmoid colon and descending colon.      Two sessile polyps were found in the hepatic flexure. The polyps were 5       to 7 mm in size. Both completely resected with cold snare. Estimated       blood loss was minimal. Large fungating ulcerated neoplastic process       arising out of what appears to be the cecum enveloping the ileocecal       valve. Typical landmarks of the cecum ileocecal valve could not be       clearly appreciated due to the bulky large tumor. There was no lumen       upstream. There was also a single 1.5 cm carpet like polyp just distal       to the ulcerated mass which was not manipulated.      Utilizing jumbo biopsy forceps the mass was biopsied multiple times.       This area was tattooed distal to the tumor and the 1.5 cm carpet polyp       which was not manipulated. Please see photos. Impression:               Large bulky tumor arising out of what appears to be                            ileocecal valve/cecum?"biopsied                           Hepatic flexure polyps removed with cold snare                           1.5 cm carpet polyp distal to the cecal mass?"not                            manipulated. Tattoo placed distal to the cecal mass                            to include the 1.5 cm polyp.                           High diverticular burden in the descending and  sigmoid segments. Moderate Sedation:      Moderate (conscious) sedation was personally administered by an       anesthesia professional. The following parameters were monitored: oxygen       saturation, heart rate, blood  pressure, respiratory rate, EKG, adequacy       of pulmonary ventilation, and response to care. Recommendation:           - Clear liquid diet. Continue to hold                            anticoagulation. Surgery consultation for an                            opinion regarding resection. Follow-up on                            pathology. See EGD report. At patient request, I                            called her niece Hailey Graham at                            [phone]?"[email or web address] today's findings and                            recommendations. Multiple questions answered.                           - See EGD report. Procedure Code(s):        --- Professional ---                           [phone], Colonoscopy, flexible; diagnostic, including                            collection of specimen(s) by brushing or washing,                            when performed (separate procedure) Diagnosis Code(s):        --- Professional ---                           R19.5, Other fecal abnormalities CPT copyright 2022 [employer]. All rights reserved. The codes documented in this report are preliminary and upon coder review may  be revised to meet current compliance requirements. Lamar HERO. Vennela Jutte, MD Lamar Ozell Hollingshead, MD 06/25/2024 10:38:37 AM This report has been signed electronically. Number of Addenda: 0

## 2024-06-25 NOTE — Plan of Care (Signed)
   Problem: Activity: Goal: Risk for activity intolerance will decrease Outcome: Progressing   Problem: Coping: Goal: Level of anxiety will decrease Outcome: Progressing   Problem: Safety: Goal: Ability to remain free from injury will improve Outcome: Progressing

## 2024-06-25 NOTE — H&P (View-Only) (Signed)
 [facility] Consult  Reason for Consult: Cecal Mass  Referring Physician: Dr. Shaaron   Chief Complaint   Anemia     HPI: Hailey Graham is a 89 y.o. female with a new Cecal mass noted on colonoscopy today. .The patient was sent from outpatient to the hospital after being found to be anemic to 5.6. She was hemoccult positive, and she has received blood. She reports no symptoms or signs that she had this going on. She reports she lives alone, drives, and cooks for herself. Her niece Hailey Graham is her POA and she helps her make decisions.   She is aware of the mass.   Past Medical History:  Diagnosis Date   Allergy    Anxiety    Arthritis    Breast cancer of upper-outer quadrant of left female breast (HCC) 11/24/2015   Cataract    Complication of anesthesia    Depression    Diverticulosis    Gait abnormality    Gastroesophageal reflux disease    Glaucoma    Hyperlipidemia    Lipid profile in 05/2010:149, 86, 48, 84   Lung nodule    Right lower lobe; stable in 2010   Paroxysmal atrial fibrillation (HCC)    Rate related right bundle branch block; normal EF on echo in 2006; normal stress nuclear-2006; mild RV hypokinesis on MRI;  RA and RV enlargement on CT in 9/06; Anticoagulation->discontinued in 2009   Personal history of kidney stones    PONV (postoperative nausea and vomiting)    Nausea    Past Surgical History:  Procedure Laterality Date   ABDOMINAL HYSTERECTOMY  2202   ANKLE SURGERY Right 12/23/2001   Fracture- rod   APPENDECTOMY     as a teenager   BREAST LUMPECTOMY Left 2016   invasive ductal ca   BREAST LUMPECTOMY WITH RADIOACTIVE SEED LOCALIZATION Left 12/13/2015   Procedure: BREAST LUMPECTOMY WITH RADIOACTIVE SEED LOCALIZATION;  Surgeon: Morene Olives, MD;  Location: [facility] OR;  Service: General;  Laterality: Left;   CATARACT EXTRACTION W/ INTRAOCULAR LENS IMPLANT Right 12/23/2012   COLONOSCOPY W/ POLYPECTOMY     ESOPHAGOGASTRODUODENOSCOPY      GLAUCOMA SURGERY Right 12/23/2012   HERNIA REPAIR Right    1990's   ORIF ANKLE FRACTURE  12/23/2001   Right   TONSILLECTOMY     age 47   Varicose veins Right 2207    Family History  Problem Relation Age of Onset   Healthy Mother    Cerebral aneurysm Father    Lung cancer Sister     Social History   Tobacco Use   Smoking status: Never   Smokeless tobacco: Never  Vaping Use   Vaping status: Never Used  Substance Use Topics   Alcohol use: No    Alcohol/week: 0.0 standard drinks of alcohol   Drug use: No    Medications: I have reviewed the patient's current medications. Prior to Admission:  Medications Prior to Admission  Medication Sig Dispense Refill Last Dose/Taking   acetaminophen  (TYLENOL ) 500 MG tablet Take 1,000 mg by mouth as needed for mild pain (pain score 1-3), moderate pain (pain score 4-6), fever or headache.   06/22/2024 at  1:30 AM   ALPRAZolam  (XANAX ) 0.25 MG tablet Take 0.25 mg by mouth daily as needed for anxiety.   Past Week   anastrozole  (ARIMIDEX ) 1 MG tablet Take 1 tablet (1 mg total) by mouth daily. 90 tablet 3 06/21/2024 Evening   Cholecalciferol (VITAMIN D ) 2000 UNITS tablet Take  2,000 Units by mouth daily.   06/21/2024 Bedtime   Cyanocobalamin (B-12) 1000 MCG TABS Take 1 tablet by mouth daily.   06/21/2024 Morning   dorzolamide -timolol  (COSOPT ) 22.3-6.8 MG/ML ophthalmic solution Place 1 drop into both eyes 2 (two) times daily.   06/22/2024 Morning   ezetimibe -simvastatin  (VYTORIN ) 10-20 MG tablet Take 1 tablet by mouth daily. 90 tablet 2 06/21/2024 Evening   latanoprost  (XALATAN ) 0.005 % ophthalmic solution Place 1 drop into both eyes at bedtime.   06/21/2024 Bedtime   metoprolol  succinate (TOPROL -XL) 25 MG 24 hr tablet TAKE (1/2) TABLET BY MOUTH TWICE DAILY (Patient taking differently: Take 12.5 mg by mouth in the morning and at bedtime.) 90 tablet 2 06/22/2024 Morning   pilocarpine  (PILOCAR) 1 % ophthalmic solution Place 1 drop into the right eye 4 times  daily.   06/22/2024 Morning   rivaroxaban  (XARELTO ) 20 MG TABS tablet Take 20 mg by mouth daily with supper.   06/21/2024 at  6:30 PM   Scheduled:  anastrozole   1 mg Oral QPM   dorzolamide -timolol   1 drop Both Eyes BID   ezetimibe   10 mg Oral Daily   And   simvastatin   20 mg Oral q1800   metoprolol  succinate  12.5 mg Oral BID   pantoprazole  (PROTONIX ) IV  40 mg Intravenous Q12H   pilocarpine   1 drop Right Eye QID   sodium chloride  flush  3 mL Intravenous Q12H   Continuous: PRN:acetaminophen , ALPRAZolam , diclofenac  Sodium, ondansetron  **OR** ondansetron  (ZOFRAN ) IV, oxyCODONE   Allergies  Allergen Reactions   Brimonidine Other (See Comments)    Crusting - difficulty opening eyes   Sulfa Drugs Cross Reactors Other (See Comments)    Unknown    Codeine Nausea Only   Morphine Nausea Only   Sulfamethoxazole Nausea Only     ROS:  A comprehensive review of systems was negative except for: Hematologic/lymphatic: positive for anemia, no reported blood per rectum  Blood pressure (!) 140/83, pulse 90, temperature 98.2 F (36.8 C), temperature source Oral, resp. rate 19, height 5' 6 (1.676 m), weight 74 kg, SpO2 100%. Physical Exam Vitals reviewed.  HENT:     Head: Normocephalic.     Mouth/Throat:     Mouth: Mucous membranes are moist.  Eyes:     Extraocular Movements: Extraocular movements intact.  Cardiovascular:     Rate and Rhythm: Normal rate.  Abdominal:     General: There is no distension.     Palpations: Abdomen is soft.     Tenderness: There is no abdominal tenderness.  Musculoskeletal:        General: Normal range of motion.  Skin:    General: Skin is warm.  Neurological:     General: No focal deficit present.     Mental Status: She is alert.  Psychiatric:        Mood and Affect: Mood normal.        Behavior: Behavior normal.     Results: Results for orders placed or performed during the hospital encounter of 06/22/24 (from the past 48 hours)  CBC     Status:  Abnormal   Collection Time: 06/24/24  4:07 AM  Result Value Ref Range   WBC 8.2 4.0 - 10.5 K/uL   RBC 4.49 3.87 - 5.11 MIL/uL   Hemoglobin 9.9 (L) 12.0 - 15.0 g/dL   HCT 65.7 (L) 63.9 - 53.9 %   MCV 76.2 (L) 80.0 - 100.0 fL   MCH 22.0 (L) 26.0 - 34.0 pg   MCHC  28.9 (L) 30.0 - 36.0 g/dL   RDW 80.5 (H) 88.4 - 84.4 %   Platelets 267 150 - 400 K/uL    Comment: REPEATED TO VERIFY   nRBC 0.0 0.0 - 0.2 %    Comment: Performed at [facility], [address] [place], [place] [address]  Basic metabolic panel with GFR     Status: Abnormal   Collection Time: 06/24/24  4:07 AM  Result Value Ref Range   Sodium 140 135 - 145 mmol/L   Potassium 3.7 3.5 - 5.1 mmol/L   Chloride 114 (H) 98 - 111 mmol/L   CO2 19 (L) 22 - 32 mmol/L   Glucose, Bld 95 70 - 99 mg/dL    Comment: Glucose reference range applies only to samples taken after fasting for at least 8 hours.   BUN 12 8 - 23 mg/dL   Creatinine, Ser 9.35 0.44 - 1.00 mg/dL   Calcium 9.7 8.9 - 89.6 mg/dL   GFR, Estimated >39 >39 mL/min    Comment: (NOTE) Calculated using the CKD-EPI Creatinine Equation (2021)    Anion gap 7 5 - 15    Comment: Performed at [facility], [address] [place], [place] [address]  Magnesium     Status: None   Collection Time: 06/24/24  4:07 AM  Result Value Ref Range   Magnesium 2.1 1.7 - 2.4 mg/dL    Comment: Performed at [facility], [address], [place], [place] [address]  CBC     Status: Abnormal   Collection Time: 06/25/24  4:35 AM  Result Value Ref Range   WBC 9.1 4.0 - 10.5 K/uL   RBC 4.56 3.87 - 5.11 MIL/uL   Hemoglobin 10.5 (L) 12.0 - 15.0 g/dL   HCT 65.2 (L) 63.9 - 53.9 %   MCV 76.1 (L) 80.0 - 100.0 fL   MCH 23.0 (L) 26.0 - 34.0 pg   MCHC 30.3 30.0 - 36.0 g/dL   RDW 79.0 (H) 88.4 - 84.4 %   Platelets 264 150 - 400 K/uL   nRBC 0.0 0.0 - 0.2 %    Comment: Performed at [facility], [address], [place], [place] [address]  Basic metabolic panel with GFR     Status: Abnormal   Collection  Time: 06/25/24  4:35 AM  Result Value Ref Range   Sodium 136 135 - 145 mmol/L   Potassium 3.3 (L) 3.5 - 5.1 mmol/L   Chloride 110 98 - 111 mmol/L   CO2 20 (L) 22 - 32 mmol/L   Glucose, Bld 97 70 - 99 mg/dL    Comment: Glucose reference range applies only to samples taken after fasting for at least 8 hours.   BUN 10 8 - 23 mg/dL   Creatinine, Ser 9.39 0.44 - 1.00 mg/dL   Calcium 9.2 8.9 - 89.6 mg/dL   GFR, Estimated >39 >39 mL/min    Comment: (NOTE) Calculated using the CKD-EPI Creatinine Equation (2021)    Anion gap 6 5 - 15    Comment: Performed at [facility], [address], [place], [place] [address]  Uric acid     Status: None   Collection Time: 06/25/24  4:30 PM  Result Value Ref Range   Uric Acid, Serum 4.1 2.5 - 7.1 mg/dL    Comment: Performed at [facility], [address], [place], [place] [address]   Personally reviewed - thickened cecum lesion in the liver  DG Knee 1-2 Views Right Result Date: 06/25/2024 CLINICAL DATA:  Right  knee pain, swelling EXAM: RIGHT KNEE - 1-2 VIEW COMPARISON:  03/30/2020 FINDINGS: Moderate tricompartment osteoarthritis with joint space narrowing and spurring, most pronounced in the lateral compartment. No joint effusion. No acute bony abnormality. Specifically, no fracture, subluxation, or dislocation. IMPRESSION: Moderate tricompartment osteoarthritis. No acute bony abnormality. Electronically Signed   By: Franky Crease M.D.   On: 06/25/2024 16:41   CT ABDOMEN PELVIS W CONTRAST Result Date: 06/25/2024 CLINICAL DATA:  Colorectal carcinoma. Mass on colonoscopy. Anemia. Staging exam. * Tracking Code: BO * EXAM: CT ABDOMEN AND PELVIS WITH CONTRAST TECHNIQUE: Multidetector CT imaging of the abdomen and pelvis was performed using the standard protocol following bolus administration of intravenous contrast. RADIATION DOSE REDUCTION: This exam was performed according to the departmental dose-optimization program which includes automated exposure control,  adjustment of the mA and/or kV according to patient size and/or use of iterative reconstruction technique. CONTRAST:  OMNIPAQUE  IOHEXOL  300 MG/ML  SOLN COMPARISON:  Contrast CT 01/25/2011 FINDINGS: Lower chest: Lung bases are clear. Hepatobiliary: Hypoenhancing mass in the lateral aspect of the LEFT hepatic lobe measures 3.9 x 3.1 cm. This lesion was not present on contrast CT from 01/25/2011. Small gallstones in the gallbladder. No biliary duct dilatation Pancreas: Pancreas is normal. No ductal dilatation. No pancreatic inflammation. Spleen: Normal spleen Adrenals/urinary tract: Adrenal glands and kidneys are normal. The ureters and bladder normal. Stomach/Bowel: Stomach, duodenum and small bowel are normal. Circumferential thickening in the cecum (image 46/2). No discrete measurable mass. Appendix not identified. Ascending, transverse descending colon. Multiple diverticula through the sigmoid colon. No obstructing mass lesion identified. No lymphadenopathy within the sigmoid mesocolon. There is enlarged lymph node in ileocecal mesentery just ventral to the RIGHT psoas muscle measuring 19 mm x 10 mm on image 46/2. Vascular/Lymphatic: Abdominal aorta is normal caliber. No periportal or retroperitoneal adenopathy. No pelvic adenopathy. Reproductive: Post hysterectomy.  Adnexa unremarkable Other: No free fluid. Musculoskeletal: No aggressive osseous lesion. IMPRESSION: 1. Circumferential thickening in the cecum concerning for primary colon adenocarcinoma given the endoscopy findings. 2. Enlarged lymph node in the ileocecal mesentery concerning for local nodal metastasis. 3. Hypoenhancing mass in the LEFT hepatic lobe concerning for hepatic metastasis. 4. Cholelithiasis without evidence cholecystitis. 5. Sigmoid diverticulosis without evidence diverticulitis. Electronically Signed   By: Jackquline Boxer M.D.   On: 06/25/2024 15:46   DG Chest Port 1 View Result Date: 06/25/2024 CLINICAL DATA:  Colon mass.   History of breast cancer. EXAM: PORTABLE CHEST 1 VIEW COMPARISON:  10/01/2021. FINDINGS: The heart size and mediastinal contours are within normal limits. There is atherosclerotic calcification aorta. Mild atelectasis is noted at the lung bases bilaterally. No effusion or pneumothorax is seen. Surgical clips are noted in the left breast. No acute osseous abnormality. IMPRESSION: Mild atelectasis at the lung bases. Electronically Signed   By: Leita Birmingham M.D.   On: 06/25/2024 15:40     Assessment & Plan:  RICKI VANHANDEL is a 89 y.o. female with a new cecal cancer diagnosis. She is elderly but is still very independent and healthy. She has already done a bowel preparation. I discussed right hemicolectomy with her and her niece and open procedure to cut down on anesthesia time. Discussed doing this on Monday and risk of bleeding, infection, anastomotic leak, risk of obstruction if nothing is done. Discussed that the stage would not be known until imaging is done and lymph nodes removed.   Discussed that we would get  CT scan and can further discuss her options. If we  did surgery Monday she would not have to complete another mechanical prep. Discussed antibiotic prep prior to surgery. Discussed post operative course and potential need for SNF.   CT results back after I saw patient- concern for liver lesion and lymph node. Will notify them tomorrow and discuss more.   All questions were answered to the satisfaction of the patient and family.   Hailey Graham 06/25/2024, 5:46 PM

## 2024-06-25 NOTE — Interval H&P Note (Signed)
 History and Physical Interval Note:  06/25/2024 9:25 AM  Hailey Graham  has presented today for surgery, with the diagnosis of anemia and heme positive stool.  The various methods of treatment have been discussed with the patient and family. After consideration of risks, benefits and other options for treatment, the patient has consented to  Procedure(s): EGD (ESOPHAGOGASTRODUODENOSCOPY) (N/A) COLONOSCOPY (N/A) as a surgical intervention.  The patient's history has been reviewed, patient examined, no change in status, stable for surgery.  I have reviewed the patient's chart and labs.  Questions were answered to the patient's satisfaction.     Zalia Hautala  No change.  Hemoglobin 10.5 this morning 3 units total since admission.  Patient additionally prepped.  Agree with need for diagnostic EGD and colonoscopy today.  No dysphagia.  The risks, benefits, limitations, imponderables and alternatives regarding both EGD and colonoscopy have been reviewed with the patient. Questions have been answered. All parties agreeable.

## 2024-06-26 DIAGNOSIS — Z66 Do not resuscitate: Secondary | ICD-10-CM | POA: Diagnosis not present

## 2024-06-26 DIAGNOSIS — E8721 Acute metabolic acidosis: Secondary | ICD-10-CM | POA: Diagnosis not present

## 2024-06-26 DIAGNOSIS — I10 Essential (primary) hypertension: Secondary | ICD-10-CM | POA: Diagnosis not present

## 2024-06-26 DIAGNOSIS — M159 Polyosteoarthritis, unspecified: Secondary | ICD-10-CM | POA: Diagnosis not present

## 2024-06-26 DIAGNOSIS — K6389 Other specified diseases of intestine: Secondary | ICD-10-CM | POA: Diagnosis present

## 2024-06-26 DIAGNOSIS — D62 Acute posthemorrhagic anemia: Secondary | ICD-10-CM | POA: Diagnosis not present

## 2024-06-26 DIAGNOSIS — C180 Malignant neoplasm of cecum: Secondary | ICD-10-CM

## 2024-06-26 DIAGNOSIS — E876 Hypokalemia: Secondary | ICD-10-CM | POA: Diagnosis not present

## 2024-06-26 DIAGNOSIS — F32A Depression, unspecified: Secondary | ICD-10-CM | POA: Diagnosis present

## 2024-06-26 DIAGNOSIS — C182 Malignant neoplasm of ascending colon: Secondary | ICD-10-CM | POA: Diagnosis not present

## 2024-06-26 DIAGNOSIS — F418 Other specified anxiety disorders: Secondary | ICD-10-CM | POA: Diagnosis not present

## 2024-06-26 DIAGNOSIS — R195 Other fecal abnormalities: Secondary | ICD-10-CM | POA: Diagnosis not present

## 2024-06-26 DIAGNOSIS — D519 Vitamin B12 deficiency anemia, unspecified: Secondary | ICD-10-CM | POA: Diagnosis not present

## 2024-06-26 DIAGNOSIS — D500 Iron deficiency anemia secondary to blood loss (chronic): Secondary | ICD-10-CM | POA: Diagnosis not present

## 2024-06-26 DIAGNOSIS — Q438 Other specified congenital malformations of intestine: Secondary | ICD-10-CM | POA: Diagnosis not present

## 2024-06-26 DIAGNOSIS — I480 Paroxysmal atrial fibrillation: Secondary | ICD-10-CM | POA: Diagnosis not present

## 2024-06-26 DIAGNOSIS — E872 Acidosis, unspecified: Secondary | ICD-10-CM | POA: Diagnosis not present

## 2024-06-26 DIAGNOSIS — K7689 Other specified diseases of liver: Secondary | ICD-10-CM | POA: Diagnosis not present

## 2024-06-26 DIAGNOSIS — K922 Gastrointestinal hemorrhage, unspecified: Secondary | ICD-10-CM | POA: Diagnosis present

## 2024-06-26 DIAGNOSIS — C50412 Malignant neoplasm of upper-outer quadrant of left female breast: Secondary | ICD-10-CM | POA: Diagnosis not present

## 2024-06-26 DIAGNOSIS — C787 Secondary malignant neoplasm of liver and intrahepatic bile duct: Secondary | ICD-10-CM | POA: Diagnosis not present

## 2024-06-26 DIAGNOSIS — F419 Anxiety disorder, unspecified: Secondary | ICD-10-CM | POA: Diagnosis present

## 2024-06-26 DIAGNOSIS — R262 Difficulty in walking, not elsewhere classified: Secondary | ICD-10-CM | POA: Diagnosis not present

## 2024-06-26 DIAGNOSIS — Z7901 Long term (current) use of anticoagulants: Secondary | ICD-10-CM | POA: Diagnosis not present

## 2024-06-26 DIAGNOSIS — K769 Liver disease, unspecified: Secondary | ICD-10-CM | POA: Diagnosis not present

## 2024-06-26 DIAGNOSIS — K254 Chronic or unspecified gastric ulcer with hemorrhage: Secondary | ICD-10-CM | POA: Diagnosis not present

## 2024-06-26 DIAGNOSIS — K5731 Diverticulosis of large intestine without perforation or abscess with bleeding: Secondary | ICD-10-CM | POA: Diagnosis not present

## 2024-06-26 DIAGNOSIS — M170 Bilateral primary osteoarthritis of knee: Secondary | ICD-10-CM | POA: Diagnosis present

## 2024-06-26 DIAGNOSIS — H409 Unspecified glaucoma: Secondary | ICD-10-CM | POA: Diagnosis not present

## 2024-06-26 DIAGNOSIS — E559 Vitamin D deficiency, unspecified: Secondary | ICD-10-CM | POA: Diagnosis not present

## 2024-06-26 DIAGNOSIS — K449 Diaphragmatic hernia without obstruction or gangrene: Secondary | ICD-10-CM | POA: Diagnosis present

## 2024-06-26 DIAGNOSIS — D72829 Elevated white blood cell count, unspecified: Secondary | ICD-10-CM | POA: Diagnosis not present

## 2024-06-26 DIAGNOSIS — K5791 Diverticulosis of intestine, part unspecified, without perforation or abscess with bleeding: Secondary | ICD-10-CM | POA: Diagnosis not present

## 2024-06-26 DIAGNOSIS — M6281 Muscle weakness (generalized): Secondary | ICD-10-CM | POA: Diagnosis not present

## 2024-06-26 DIAGNOSIS — K219 Gastro-esophageal reflux disease without esophagitis: Secondary | ICD-10-CM | POA: Diagnosis not present

## 2024-06-26 DIAGNOSIS — R160 Hepatomegaly, not elsewhere classified: Secondary | ICD-10-CM | POA: Diagnosis not present

## 2024-06-26 DIAGNOSIS — R488 Other symbolic dysfunctions: Secondary | ICD-10-CM | POA: Diagnosis not present

## 2024-06-26 DIAGNOSIS — E785 Hyperlipidemia, unspecified: Secondary | ICD-10-CM | POA: Diagnosis not present

## 2024-06-26 DIAGNOSIS — Z48815 Encounter for surgical aftercare following surgery on the digestive system: Secondary | ICD-10-CM | POA: Diagnosis not present

## 2024-06-26 LAB — BASIC METABOLIC PANEL WITH GFR
Anion gap: 2 — ABNORMAL LOW (ref 5–15)
BUN: 13 mg/dL (ref 8–23)
CO2: 21 mmol/L — ABNORMAL LOW (ref 22–32)
Calcium: 9.4 mg/dL (ref 8.9–10.3)
Chloride: 110 mmol/L (ref 98–111)
Creatinine, Ser: 0.75 mg/dL (ref 0.44–1.00)
GFR, Estimated: >60 mL/min (ref >60)
Glucose, Bld: 101 mg/dL — ABNORMAL HIGH (ref 70–99)
Potassium: 4 mmol/L (ref 3.5–5.1)
Sodium: 133 mmol/L — ABNORMAL LOW (ref 135–145)

## 2024-06-26 LAB — CBC
HCT: 33.5 % — ABNORMAL LOW (ref 36.0–46.0)
Hemoglobin: 9.7 g/dL — ABNORMAL LOW (ref 12.0–15.0)
MCH: 22.2 pg — ABNORMAL LOW (ref 26.0–34.0)
MCHC: 29 g/dL — ABNORMAL LOW (ref 30.0–36.0)
MCV: 76.8 fL — ABNORMAL LOW (ref 80.0–100.0)
Platelets: 256 K/uL (ref 150–400)
RBC: 4.36 MIL/uL (ref 3.87–5.11)
RDW: 22.3 % — ABNORMAL HIGH (ref 11.5–15.5)
WBC: 11.3 K/uL — ABNORMAL HIGH (ref 4.0–10.5)
nRBC: 0 % (ref 0.0–0.2)

## 2024-06-26 NOTE — Plan of Care (Signed)

## 2024-06-26 NOTE — Progress Notes (Signed)
 PROGRESS NOTE   Hailey Graham  FMW:[id number] DOB: [birth year] DOA: 06/22/2024 PCP: Sheryle Carwin, MD   Chief Complaint  Patient presents with   Anemia   Level of care: Med-Surg  Brief Admission History:  89 y.o. female with medical history significant of paroxysmal atrial fibrillation on Xarelto  and no other medical history who saw her cardiologist last Thursday for routine checkup, she was supposed to go for the lab check next day Friday but some reason order was not sent to the laboratory.  She went yesterday, blood was drawn, however CBC resulted today with hemoglobin under 7 so she was called by her cardiologist Dr. Okey to present to the ED.  Patient herself has no complaints at all.  She denies any nausea, vomiting, chest pain, shortness of breath, palpitation, any problem with urination or bowel bowel movements, no hematochezia, hematemesis or melena.   ED Course: Upon arrival to ED, patient fairly stable hemodynamically.  CBC showed hemoglobin of 5.6.  FOBT positive.  BMP not drawn.  2 units of PRBC transfusion ordered.  ED physician consulted GI, they recommended admission.  Hospitalist called for admission.   Assessment and Plan:  Acute blood loss anemia secondary to presumed upper GI bleed, POA: Hemoglobin 5.6.  2 units PRBC transfusion ordered by ED.  She is already receiving the first 1.  GI is consulted.  She is going for EGD/colon today.  Holding Xarelto . Cecal Mass - consulted to general surgery; pt and family considering hemicolectomy after weighing the risks, benefits Per surgeon's orders she will remain on clears diet for now  --appreciate GI consult   Paroxysmal atrial fibrillation: Currently in sinus rhythm.  Resumed home Toprol -XL for rate control. Pt says she takes 12.5 mg twice daily which we have resumed how she takes at home    Anxiety: Resumed home dose of Xanax .  History of left breast cancer -- resumed home arimedex   Hypokalemia -- repleted   DVT  prophylaxis: SCDs Code Status: DNR  Family Communication: bedside 7/4, 7/5  Disposition home     Consultants:  GI services  Procedures:   Antimicrobials:    Subjective: Pt remains undecided about surgery; says her right knee feels better with diclofenac  gel      Objective: Vitals:   06/25/24 1235 06/25/24 1944 06/26/24 0332 06/26/24 1338  BP: (!) 140/83 116/74 (!) 105/52 125/77  Pulse: 90 90 77 80  Resp:  18 18   Temp: 98.2 F (36.8 C) 97.9 F (36.6 C) 99.1 F (37.3 C) 98.4 F (36.9 C)  TempSrc: Oral Oral Oral Axillary  SpO2: 100% 100% 91% 98%  Weight:      Height:        Intake/Output Summary (Last 24 hours) at 06/26/2024 1619 Last data filed at 06/26/2024 0300 Gross per 24 hour  Intake 1000 ml  Output --  Net 1000 ml    Filed Weights   06/22/24 1732 06/22/24 2047  Weight: 74 kg 74 kg   Examination:  General exam: Appears calm and comfortable  Respiratory system: Clear to auscultation. Respiratory effort normal. Cardiovascular system: normal S1 & S2 heard. No JVD, murmurs, rubs, gallops or clicks. No pedal edema. Gastrointestinal system: Abdomen is nondistended, soft and nontender. No organomegaly or masses felt. Normal bowel sounds heard. Central nervous system: Alert and oriented. No focal neurological deficits. Extremities: diffuse severe osteoarthritis of knees, R>L Skin: No rashes, lesions or ulcers. Psychiatry: Judgement and insight appear normal. Mood & affect appropriate.   Data Reviewed:  I have personally reviewed following labs and imaging studies  CBC: Recent Labs  Lab 06/22/24 1410 06/23/24 0431 06/24/24 0407 06/25/24 0435 06/26/24 0326  WBC 4.9 7.2 8.2 9.1 11.3*  HGB 5.6* 9.1* 9.9* 10.5* 9.7*  HCT 20.5* 30.7* 34.2* 34.7* 33.5*  MCV 72.2* 74.7* 76.2* 76.1* 76.8*  PLT 247 241 267 264 256    Basic Metabolic Panel: Recent Labs  Lab 06/22/24 1410 06/23/24 0431 06/24/24 0407 06/25/24 0435 06/26/24 0326  NA 139 144 140 136 133*  K  3.6 3.2* 3.7 3.3* 4.0  CL 110 114* 114* 110 110  CO2 21* 19* 19* 20* 21*  GLUCOSE 99 87 95 97 101*  BUN 21 13 12 10 13   CREATININE 0.74 0.51 0.64 0.60 0.75  CALCIUM 9.5 9.3 9.7 9.2 9.4  MG  --   --  2.1  --   --     CBG: No results for input(s): GLUCAP in the last 168 hours.  No results found for this or any previous visit (from the past 240 hours).   Radiology Studies: DG Knee 1-2 Views Right Result Date: 06/25/2024 CLINICAL DATA:  Right knee pain, swelling EXAM: RIGHT KNEE - 1-2 VIEW COMPARISON:  03/30/2020 FINDINGS: Moderate tricompartment osteoarthritis with joint space narrowing and spurring, most pronounced in the lateral compartment. No joint effusion. No acute bony abnormality. Specifically, no fracture, subluxation, or dislocation. IMPRESSION: Moderate tricompartment osteoarthritis. No acute bony abnormality. Electronically Signed   By: Franky Crease M.D.   On: 06/25/2024 16:41   CT ABDOMEN PELVIS W CONTRAST Result Date: 06/25/2024 CLINICAL DATA:  Colorectal carcinoma. Mass on colonoscopy. Anemia. Staging exam. * Tracking Code: BO * EXAM: CT ABDOMEN AND PELVIS WITH CONTRAST TECHNIQUE: Multidetector CT imaging of the abdomen and pelvis was performed using the standard protocol following bolus administration of intravenous contrast. RADIATION DOSE REDUCTION: This exam was performed according to the departmental dose-optimization program which includes automated exposure control, adjustment of the mA and/or kV according to patient size and/or use of iterative reconstruction technique. CONTRAST:  OMNIPAQUE  IOHEXOL  300 MG/ML  SOLN COMPARISON:  Contrast CT 01/25/2011 FINDINGS: Lower chest: Lung bases are clear. Hepatobiliary: Hypoenhancing mass in the lateral aspect of the LEFT hepatic lobe measures 3.9 x 3.1 cm. This lesion was not present on contrast CT from 01/25/2011. Small gallstones in the gallbladder. No biliary duct dilatation Pancreas: Pancreas is normal. No ductal dilatation. No  pancreatic inflammation. Spleen: Normal spleen Adrenals/urinary tract: Adrenal glands and kidneys are normal. The ureters and bladder normal. Stomach/Bowel: Stomach, duodenum and small bowel are normal. Circumferential thickening in the cecum (image 46/2). No discrete measurable mass. Appendix not identified. Ascending, transverse descending colon. Multiple diverticula through the sigmoid colon. No obstructing mass lesion identified. No lymphadenopathy within the sigmoid mesocolon. There is enlarged lymph node in ileocecal mesentery just ventral to the RIGHT psoas muscle measuring 19 mm x 10 mm on image 46/2. Vascular/Lymphatic: Abdominal aorta is normal caliber. No periportal or retroperitoneal adenopathy. No pelvic adenopathy. Reproductive: Post hysterectomy.  Adnexa unremarkable Other: No free fluid. Musculoskeletal: No aggressive osseous lesion. IMPRESSION: 1. Circumferential thickening in the cecum concerning for primary colon adenocarcinoma given the endoscopy findings. 2. Enlarged lymph node in the ileocecal mesentery concerning for local nodal metastasis. 3. Hypoenhancing mass in the LEFT hepatic lobe concerning for hepatic metastasis. 4. Cholelithiasis without evidence cholecystitis. 5. Sigmoid diverticulosis without evidence diverticulitis. Electronically Signed   By: Jackquline Boxer M.D.   On: 06/25/2024 15:46   DG Chest Port 1  View Result Date: 06/25/2024 CLINICAL DATA:  Colon mass.  History of breast cancer. EXAM: PORTABLE CHEST 1 VIEW COMPARISON:  10/01/2021. FINDINGS: The heart size and mediastinal contours are within normal limits. There is atherosclerotic calcification aorta. Mild atelectasis is noted at the lung bases bilaterally. No effusion or pneumothorax is seen. Surgical clips are noted in the left breast. No acute osseous abnormality. IMPRESSION: Mild atelectasis at the lung bases. Electronically Signed   By: Leita Birmingham M.D.   On: 06/25/2024 15:40   Scheduled Meds:  anastrozole   1 mg  Oral QPM   dorzolamide -timolol   1 drop Both Eyes BID   ezetimibe   10 mg Oral Daily   And   simvastatin   20 mg Oral q1800   metoprolol  succinate  12.5 mg Oral BID   pantoprazole  (PROTONIX ) IV  40 mg Intravenous Q12H   pilocarpine   1 drop Right Eye QID   sodium chloride  flush  3 mL Intravenous Q12H   Continuous Infusions:   LOS: 0 days   Time spent: 55 mins  Houston Zapien Vicci, MD How to contact the [facility] Attending or Consulting provider 7A - 7P or covering provider during after hours 7P -7A, for this patient?  Check the care team in [facility] and look for a) attending/consulting [facility] provider listed and b) the [facility] team listed Log into [email or web address] to find provider on call.  Locate the [facility] provider you are looking for under [facility] and page to a number that you can be directly reached. If you still have difficulty reaching the provider, please page the [facility] (Director on Call) for the Hospitalists listed on amion for assistance.  06/26/2024, 4:19 PM

## 2024-06-26 NOTE — Progress Notes (Signed)
 [facility]  Discussed the cecal mass /cancer and the liver lesion with her and her niece. Discussed risk of obstruction and bleeding. Discussed option of surgery again. They want to think about it some more. Discussed risk of bleeding ,infection, anastomotic leak, ureter injury. Post operative course discussed.   BP 125/77 (BP Location: Left Arm)   Pulse 80   Temp 98.4 F (36.9 C) (Axillary)   Resp 18   Ht 5' 6 (1.676 m)   Wt 74 kg   SpO2 98%   BMI 26.33 kg/m  Soft nondistended nontender  Patient with cecal mass. Will see what they decide. Tentative plan for Monday for R hemicolectomy. She should stay on clears until we know for sure what she wants to do.  Hailey Pander, MD [facility] [address] [place], [place] [address] [phone] (office)

## 2024-06-27 DIAGNOSIS — K922 Gastrointestinal hemorrhage, unspecified: Secondary | ICD-10-CM | POA: Diagnosis not present

## 2024-06-27 DIAGNOSIS — D500 Iron deficiency anemia secondary to blood loss (chronic): Secondary | ICD-10-CM | POA: Diagnosis not present

## 2024-06-27 DIAGNOSIS — C180 Malignant neoplasm of cecum: Secondary | ICD-10-CM | POA: Diagnosis not present

## 2024-06-27 DIAGNOSIS — R195 Other fecal abnormalities: Secondary | ICD-10-CM | POA: Diagnosis not present

## 2024-06-27 LAB — SURGICAL PCR SCREEN
MRSA, PCR: NEGATIVE
Staphylococcus aureus: NEGATIVE

## 2024-06-27 LAB — BASIC METABOLIC PANEL WITH GFR
Anion gap: 9 (ref 5–15)
BUN: 17 mg/dL (ref 8–23)
CO2: 18 mmol/L — ABNORMAL LOW (ref 22–32)
Calcium: 9.3 mg/dL (ref 8.9–10.3)
Chloride: 106 mmol/L (ref 98–111)
Creatinine, Ser: 0.64 mg/dL (ref 0.44–1.00)
GFR, Estimated: >60 mL/min (ref >60)
Glucose, Bld: 84 mg/dL (ref 70–99)
Potassium: 3.8 mmol/L (ref 3.5–5.1)
Sodium: 133 mmol/L — ABNORMAL LOW (ref 135–145)

## 2024-06-27 LAB — CBC
HCT: 32.5 % — ABNORMAL LOW (ref 36.0–46.0)
Hemoglobin: 9.3 g/dL — ABNORMAL LOW (ref 12.0–15.0)
MCH: 22.4 pg — ABNORMAL LOW (ref 26.0–34.0)
MCHC: 28.6 g/dL — ABNORMAL LOW (ref 30.0–36.0)
MCV: 78.1 fL — ABNORMAL LOW (ref 80.0–100.0)
Platelets: 213 K/uL (ref 150–400)
RBC: 4.16 MIL/uL (ref 3.87–5.11)
RDW: 22.8 % — ABNORMAL HIGH (ref 11.5–15.5)
WBC: 9.9 K/uL (ref 4.0–10.5)
nRBC: 0 % (ref 0.0–0.2)

## 2024-06-27 LAB — TYPE AND SCREEN
ABO/RH(D): A POS
Antibody Screen: NEGATIVE

## 2024-06-27 MED ORDER — METRONIDAZOLE 500 MG PO TABS
1000.0000 mg | ORAL_TABLET | Freq: Once | ORAL | Status: AC
  Administered 2024-06-27: 1000 mg via ORAL
  Filled 2024-06-27: qty 2

## 2024-06-27 MED ORDER — ALVIMOPAN 12 MG PO CAPS: 12.0000 mg | ORAL_CAPSULE | ORAL | Status: DC

## 2024-06-27 MED ORDER — NEOMYCIN SULFATE 500 MG PO TABS
1000.0000 mg | ORAL_TABLET | Freq: Once | ORAL | Status: AC
  Administered 2024-06-27: 1000 mg via ORAL
  Filled 2024-06-27: qty 2

## 2024-06-27 MED ORDER — MUPIROCIN 2 % EX OINT
1.0000 | TOPICAL_OINTMENT | Freq: Two times a day (BID) | CUTANEOUS | Status: AC
  Administered 2024-06-27 – 2024-07-01 (×9): 1 via NASAL
  Filled 2024-06-27: qty 22

## 2024-06-27 MED ORDER — CHLORHEXIDINE GLUCONATE CLOTH 2 % EX PADS
6.0000 | MEDICATED_PAD | Freq: Once | CUTANEOUS | Status: AC
  Administered 2024-06-27: 6 via TOPICAL

## 2024-06-27 MED ORDER — SODIUM CHLORIDE 0.9 % IV SOLN
2.0000 g | INTRAVENOUS | Status: AC
  Administered 2024-06-28: 2 g via INTRAVENOUS
  Filled 2024-06-27: qty 2

## 2024-06-27 MED ORDER — CHLORHEXIDINE GLUCONATE CLOTH 2 % EX PADS
6.0000 | MEDICATED_PAD | Freq: Once | CUTANEOUS | Status: AC
  Administered 2024-06-28: 6 via TOPICAL

## 2024-06-27 MED ORDER — ALVIMOPAN 12 MG PO CAPS
12.0000 mg | ORAL_CAPSULE | ORAL | Status: AC
  Administered 2024-06-28: 12 mg via ORAL
  Filled 2024-06-27: qty 1

## 2024-06-27 MED ORDER — SODIUM CHLORIDE 0.9 % IV SOLN: 2.0000 g | INTRAVENOUS | Status: DC

## 2024-06-27 NOTE — Plan of Care (Signed)

## 2024-06-27 NOTE — Progress Notes (Signed)
 PROGRESS NOTE   Hailey Graham  FMW:[id number] DOB: May 20, 1928 DOA: 06/22/2024 PCP: Hailey Carwin, MD   Chief Complaint  Patient presents with   Anemia   Level of care: Med-Surg  Brief Admission History:  89 y.o. female with medical history significant of paroxysmal atrial fibrillation on Xarelto  and no other medical history who saw her cardiologist last Thursday for routine checkup, she was supposed to go for the lab check next day Friday but some reason order was not sent to the laboratory.  She went yesterday, blood was drawn, however CBC resulted today with hemoglobin under 7 so she was called by her cardiologist Dr. Okey to present to the ED.  Patient herself has no complaints at all.  She denies any nausea, vomiting, chest pain, shortness of breath, palpitation, any problem with urination or bowel bowel movements, no hematochezia, hematemesis or melena.   ED Course: Upon arrival to ED, patient fairly stable hemodynamically.  CBC showed hemoglobin of 5.6.  FOBT positive.  BMP not drawn.  2 units of PRBC transfusion ordered.  ED physician consulted GI, they recommended admission.  Hospitalist called for admission.   Assessment and Plan:  Acute blood loss anemia secondary to lower GI bleed, POA: Hemoglobin 5.6.  2 units PRBC transfusion ordered by ED.   GI is consulted and colonoscopy with findings of a large cecal mass.  Holding Xarelto . Cecal Mass - consulted to general surgery; pt and family considering hemicolectomy after weighing the risks, benefits --appreciate GI and surgery consult --pt was offered right hemicolectomy and afted discussing with family and surgeon she would like to proceed with surgery --pt to OR on 7/7 for right hemicolectomy and possible liver biopsy with Dr Hailey Graham   Paroxysmal atrial fibrillation: Currently in sinus rhythm.  Resumed home Toprol -XL for rate control. Pt says she takes 12.5 mg twice daily which we have resumed how she takes at home    Anxiety:  Resumed home dose of Xanax .  History of left breast cancer -- resumed home arimedex   Hypokalemia -- repleted   DVT prophylaxis: SCDs Code Status: DNR  Family Communication: bedside 7/4, 7/5  Disposition home     Consultants:  GI services  Procedures:   Antimicrobials:    Subjective: Pt decided to move forward with surgery tomorrow after discussing with surgeon and family. Pt did not sleep well last night.        Objective: Vitals:   06/26/24 0332 06/26/24 1338 06/26/24 2028 06/27/24 0351  BP: (!) 105/52 125/77 (!) 121/59 (!) 101/45  Pulse: 77 80 73 82  Resp: 18  16 17   Temp: 99.1 F (37.3 C) 98.4 F (36.9 C) 98.4 F (36.9 C) 99.5 F (37.5 C)  TempSrc: Oral Axillary Oral Oral  SpO2: 91% 98% 99% 94%  Weight:      Height:        Intake/Output Summary (Last 24 hours) at 06/27/2024 1221 Last data filed at 06/27/2024 0934 Gross per 24 hour  Intake 363 ml  Output 150 ml  Net 213 ml    Filed Weights   06/22/24 1732 06/22/24 2047  Weight: 74 kg 74 kg   Examination:  General exam: Appears calm and comfortable  Respiratory system: Clear to auscultation. Respiratory effort normal. Cardiovascular system: normal S1 & S2 heard. No JVD, murmurs, rubs, gallops or clicks. No pedal edema. Gastrointestinal system: Abdomen is nondistended, soft and nontender. No organomegaly or masses felt. Normal bowel sounds heard. Central nervous system: Alert and oriented. No focal  neurological deficits. Extremities: diffuse severe osteoarthritis of knees, R>L Skin: No rashes, lesions or ulcers. Psychiatry: Judgement and insight appear normal. Mood & affect appropriate.   Data Reviewed: I have personally reviewed following labs and imaging studies  CBC: Recent Labs  Lab 06/23/24 0431 06/24/24 0407 06/25/24 0435 06/26/24 0326 06/27/24 0343  WBC 7.2 8.2 9.1 11.3* 9.9  HGB 9.1* 9.9* 10.5* 9.7* 9.3*  HCT 30.7* 34.2* 34.7* 33.5* 32.5*  MCV 74.7* 76.2* 76.1* 76.8* 78.1*  PLT 241 267  264 256 213    Basic Metabolic Panel: Recent Labs  Lab 06/23/24 0431 06/24/24 0407 06/25/24 0435 06/26/24 0326 06/27/24 0343  NA 144 140 136 133* 133*  K 3.2* 3.7 3.3* 4.0 3.8  CL 114* 114* 110 110 106  CO2 19* 19* 20* 21* 18*  GLUCOSE 87 95 97 101* 84  BUN 13 12 10 13 17   CREATININE 0.51 0.64 0.60 0.75 0.64  CALCIUM 9.3 9.7 9.2 9.4 9.3  MG  --  2.1  --   --   --     CBG: No results for input(s): GLUCAP in the last 168 hours.  Recent Results (from the past 240 hours)  Surgical PCR screen     Status: None   Collection Time: 06/27/24 10:09 AM   Specimen: Nasal Mucosa; Nasal Swab  Result Value Ref Range Status   MRSA, PCR NEGATIVE NEGATIVE Final   Staphylococcus aureus NEGATIVE NEGATIVE Final    Comment: (NOTE) The Xpert SA Assay (FDA approved for NASAL specimens in patients 75 years of age and older), is one component of a comprehensive surveillance program. It is not intended to diagnose infection nor to guide or monitor treatment. Performed at [facility], [address], [place], [place] [address]      Radiology Studies: DG Knee 1-2 Views Right Result Date: 06/25/2024 CLINICAL DATA:  Right knee pain, swelling EXAM: RIGHT KNEE - 1-2 VIEW COMPARISON:  03/30/2020 FINDINGS: Moderate tricompartment osteoarthritis with joint space narrowing and spurring, most pronounced in the lateral compartment. No joint effusion. No acute bony abnormality. Specifically, no fracture, subluxation, or dislocation. IMPRESSION: Moderate tricompartment osteoarthritis. No acute bony abnormality. Electronically Signed   By: Franky Crease M.D.   On: 06/25/2024 16:41   CT ABDOMEN PELVIS W CONTRAST Result Date: 06/25/2024 CLINICAL DATA:  Colorectal carcinoma. Mass on colonoscopy. Anemia. Staging exam. * Tracking Code: BO * EXAM: CT ABDOMEN AND PELVIS WITH CONTRAST TECHNIQUE: Multidetector CT imaging of the abdomen and pelvis was performed using the standard protocol following bolus administration of  intravenous contrast. RADIATION DOSE REDUCTION: This exam was performed according to the departmental dose-optimization program which includes automated exposure control, adjustment of the mA and/or kV according to patient size and/or use of iterative reconstruction technique. CONTRAST:  OMNIPAQUE  IOHEXOL  300 MG/ML  SOLN COMPARISON:  Contrast CT 01/25/2011 FINDINGS: Lower chest: Lung bases are clear. Hepatobiliary: Hypoenhancing mass in the lateral aspect of the LEFT hepatic lobe measures 3.9 x 3.1 cm. This lesion was not present on contrast CT from 01/25/2011. Small gallstones in the gallbladder. No biliary duct dilatation Pancreas: Pancreas is normal. No ductal dilatation. No pancreatic inflammation. Spleen: Normal spleen Adrenals/urinary tract: Adrenal glands and kidneys are normal. The ureters and bladder normal. Stomach/Bowel: Stomach, duodenum and small bowel are normal. Circumferential thickening in the cecum (image 46/2). No discrete measurable mass. Appendix not identified. Ascending, transverse descending colon. Multiple diverticula through the sigmoid colon. No obstructing mass lesion identified. No lymphadenopathy within the sigmoid mesocolon. There is  enlarged lymph node in ileocecal mesentery just ventral to the RIGHT psoas muscle measuring 19 mm x 10 mm on image 46/2. Vascular/Lymphatic: Abdominal aorta is normal caliber. No periportal or retroperitoneal adenopathy. No pelvic adenopathy. Reproductive: Post hysterectomy.  Adnexa unremarkable Other: No free fluid. Musculoskeletal: No aggressive osseous lesion. IMPRESSION: 1. Circumferential thickening in the cecum concerning for primary colon adenocarcinoma given the endoscopy findings. 2. Enlarged lymph node in the ileocecal mesentery concerning for local nodal metastasis. 3. Hypoenhancing mass in the LEFT hepatic lobe concerning for hepatic metastasis. 4. Cholelithiasis without evidence cholecystitis. 5. Sigmoid diverticulosis without evidence  diverticulitis. Electronically Signed   By: Jackquline Boxer M.D.   On: 06/25/2024 15:46   DG Chest Port 1 View Result Date: 06/25/2024 CLINICAL DATA:  Colon mass.  History of breast cancer. EXAM: PORTABLE CHEST 1 VIEW COMPARISON:  10/01/2021. FINDINGS: The heart size and mediastinal contours are within normal limits. There is atherosclerotic calcification aorta. Mild atelectasis is noted at the lung bases bilaterally. No effusion or pneumothorax is seen. Surgical clips are noted in the left breast. No acute osseous abnormality. IMPRESSION: Mild atelectasis at the lung bases. Electronically Signed   By: Leita Birmingham M.D.   On: 06/25/2024 15:40   Scheduled Meds:  [START ON 06/28/2024] alvimopan   12 mg Oral On Call to OR   anastrozole   1 mg Oral QPM   Chlorhexidine  Gluconate Cloth  6 each Topical Once   And   [START ON 06/28/2024] Chlorhexidine  Gluconate Cloth  6 each Topical Once   dorzolamide -timolol   1 drop Both Eyes BID   ezetimibe   10 mg Oral Daily   And   simvastatin   20 mg Oral q1800   metoprolol  succinate  12.5 mg Oral BID   metroNIDAZOLE   1,000 mg Oral Once   Followed by   metroNIDAZOLE   1,000 mg Oral Once   Followed by   metroNIDAZOLE   1,000 mg Oral Once   mupirocin  ointment  1 Application Nasal BID   neomycin   1,000 mg Oral Once   Followed by   neomycin   1,000 mg Oral Once   Followed by   neomycin   1,000 mg Oral Once   pantoprazole  (PROTONIX ) IV  40 mg Intravenous Q12H   pilocarpine   1 drop Right Eye QID   sodium chloride  flush  3 mL Intravenous Q12H   Continuous Infusions:  [START ON 06/28/2024] cefoTEtan  (CEFOTAN ) IV      LOS: 1 day   Time spent: 55 mins  Cambria Osten Vicci, MD How to contact the [facility] Attending or Consulting provider 7A - 7P or covering provider during after hours 7P -7A, for this patient?  Check the care team in [facility] and look for a) attending/consulting TRH provider listed and b) the TRH team listed Log into [email or web address] to find provider on call.  Locate  the TRH provider you are looking for under [facility] and page to a number that you can be directly reached. If you still have difficulty reaching the provider, please page the [facility] (Director on Call) for the Hospitalists listed on amion for assistance.  06/27/2024, 12:21 PM

## 2024-06-27 NOTE — Plan of Care (Signed)
  Problem: Education: Goal: Knowledge of General Education information will improve Description: Including pain rating scale, medication(s)/side effects and non-pharmacologic comfort measures Outcome: Progressing   Problem: Clinical Measurements: Goal: Will remain free from infection Outcome: Progressing   Problem: Nutrition: Goal: Adequate nutrition will be maintained Outcome: Progressing   

## 2024-06-27 NOTE — Progress Notes (Signed)
 [facility]  Patient wants to pursue surgery tomorrow. She has been on clears and completed her bowel prep for the colonoscopy. Discussed all the risk yesterday with [facility].   BP (!) 101/45   Pulse 82   Temp 99.5 F (37.5 C) (Oral)   Resp 17   Ht 5' 6 (1.676 m)   Wt 74 kg   SpO2 94%   BMI 26.33 kg/m  Soft, nondistended, nontender   Patient with cecal cancer and concern for liver lesion. OR tomorrow for right hemicolectomy and possible liver biopsy.   Antibiotic prep ordered:  Neomycin  1000mg  and Flagyl  1000mg  at 1400, 1500, 2200.   NPO at midnight Type and screen ordered Consent orders in place.  Manuelita Pander, MD [facility] [address] [place], [place] [address] [phone] (office)

## 2024-06-28 ENCOUNTER — Encounter (HOSPITAL_COMMUNITY): Payer: Self-pay | Admitting: Family Medicine

## 2024-06-28 ENCOUNTER — Inpatient Hospital Stay (HOSPITAL_COMMUNITY): Admitting: Anesthesiology

## 2024-06-28 ENCOUNTER — Encounter (HOSPITAL_COMMUNITY)
Admission: EM | Disposition: A | Discharge status: Skilled Nursing Facility | Payer: Self-pay | Source: Home / Self Care | Attending: Family Medicine

## 2024-06-28 ENCOUNTER — Other Ambulatory Visit: Payer: Self-pay

## 2024-06-28 DIAGNOSIS — R160 Hepatomegaly, not elsewhere classified: Secondary | ICD-10-CM | POA: Diagnosis not present

## 2024-06-28 DIAGNOSIS — I10 Essential (primary) hypertension: Secondary | ICD-10-CM | POA: Diagnosis not present

## 2024-06-28 DIAGNOSIS — K922 Gastrointestinal hemorrhage, unspecified: Secondary | ICD-10-CM | POA: Diagnosis not present

## 2024-06-28 DIAGNOSIS — E785 Hyperlipidemia, unspecified: Secondary | ICD-10-CM | POA: Diagnosis not present

## 2024-06-28 DIAGNOSIS — C180 Malignant neoplasm of cecum: Secondary | ICD-10-CM

## 2024-06-28 DIAGNOSIS — F418 Other specified anxiety disorders: Secondary | ICD-10-CM | POA: Diagnosis not present

## 2024-06-28 DIAGNOSIS — R195 Other fecal abnormalities: Secondary | ICD-10-CM | POA: Diagnosis not present

## 2024-06-28 DIAGNOSIS — D500 Iron deficiency anemia secondary to blood loss (chronic): Secondary | ICD-10-CM | POA: Diagnosis not present

## 2024-06-28 HISTORY — PX: COLOSTOMY REVISION: SHX5232

## 2024-06-28 LAB — CBC
HCT: 32 % — ABNORMAL LOW (ref 36.0–46.0)
Hemoglobin: 9.7 g/dL — ABNORMAL LOW (ref 12.0–15.0)
MCH: 23.4 pg — ABNORMAL LOW (ref 26.0–34.0)
MCHC: 30.3 g/dL (ref 30.0–36.0)
MCV: 77.1 fL — ABNORMAL LOW (ref 80.0–100.0)
Platelets: 233 K/uL (ref 150–400)
RBC: 4.15 MIL/uL (ref 3.87–5.11)
RDW: 23.4 % — ABNORMAL HIGH (ref 11.5–15.5)
WBC: 10.1 K/uL (ref 4.0–10.5)
nRBC: 0 % (ref 0.0–0.2)

## 2024-06-28 LAB — BASIC METABOLIC PANEL WITH GFR
Anion gap: 11 (ref 5–15)
BUN: 23 mg/dL (ref 8–23)
CO2: 17 mmol/L — ABNORMAL LOW (ref 22–32)
Calcium: 9.4 mg/dL (ref 8.9–10.3)
Chloride: 107 mmol/L (ref 98–111)
Creatinine, Ser: 0.7 mg/dL (ref 0.44–1.00)
GFR, Estimated: >60 mL/min (ref >60)
Glucose, Bld: 80 mg/dL (ref 70–99)
Potassium: 4 mmol/L (ref 3.5–5.1)
Sodium: 135 mmol/L (ref 135–145)

## 2024-06-28 LAB — CEA: CEA: 2.7 ng/mL (ref 0.0–4.7)

## 2024-06-28 SURGERY — COLECTOMY, RIGHT
Anesthesia: General | Site: Abdomen | Laterality: Right

## 2024-06-28 MED ORDER — CHLORHEXIDINE GLUCONATE 0.12 % MT SOLN
OROMUCOSAL | Status: AC
  Filled 2024-06-28: qty 15

## 2024-06-28 MED ORDER — FENTANYL CITRATE PF 50 MCG/ML IJ SOSY: 25.0000 ug | PREFILLED_SYRINGE | INTRAMUSCULAR | Status: DC | PRN

## 2024-06-28 MED ORDER — HEMOSTATIC AGENTS (NO CHARGE) OPTIME
TOPICAL | Status: DC | PRN
  Administered 2024-06-28: 2 via TOPICAL

## 2024-06-28 MED ORDER — 0.9 % SODIUM CHLORIDE (POUR BTL) OPTIME
TOPICAL | Status: DC | PRN
Start: 2024-06-28 — End: 2024-06-28
  Administered 2024-06-28: 2000 mL

## 2024-06-28 MED ORDER — FENTANYL CITRATE (PF) 100 MCG/2ML IJ SOLN
INTRAMUSCULAR | Status: DC | PRN
  Administered 2024-06-28 (×3): 50 ug via INTRAVENOUS

## 2024-06-28 MED ORDER — ENSURE SURGERY PO LIQD
237.0000 mL | Freq: Two times a day (BID) | ORAL | Status: DC
  Administered 2024-06-29 – 2024-07-01 (×4): 237 mL via ORAL
  Filled 2024-06-28 (×11): qty 237

## 2024-06-28 MED ORDER — DEXAMETHASONE SODIUM PHOSPHATE 10 MG/ML IJ SOLN
INTRAMUSCULAR | Status: DC | PRN
  Administered 2024-06-28: 7 mg via INTRAVENOUS

## 2024-06-28 MED ORDER — ALVIMOPAN 12 MG PO CAPS
12.0000 mg | ORAL_CAPSULE | Freq: Two times a day (BID) | ORAL | Status: DC
  Administered 2024-06-29: 12 mg via ORAL
  Filled 2024-06-28 (×2): qty 1

## 2024-06-28 MED ORDER — PHENYLEPHRINE HCL (PRESSORS) 10 MG/ML IV SOLN
INTRAVENOUS | Status: AC
  Filled 2024-06-28: qty 1

## 2024-06-28 MED ORDER — SODIUM CHLORIDE 0.9 % IV SOLN
2.0000 g | Freq: Two times a day (BID) | INTRAVENOUS | Status: AC
  Administered 2024-06-28 – 2024-06-29 (×2): 2 g via INTRAVENOUS
  Filled 2024-06-28 (×2): qty 2

## 2024-06-28 MED ORDER — FENTANYL CITRATE (PF) 100 MCG/2ML IJ SOLN
INTRAMUSCULAR | Status: AC
  Filled 2024-06-28: qty 2

## 2024-06-28 MED ORDER — ORAL CARE MOUTH RINSE
15.0000 mL | OROMUCOSAL | Status: DC | PRN
Start: 2024-06-28 — End: 2024-07-02

## 2024-06-28 MED ORDER — DEXMEDETOMIDINE HCL IN NACL 80 MCG/20ML IV SOLN
INTRAVENOUS | Status: DC | PRN
  Administered 2024-06-28 (×2): 8 ug via INTRAVENOUS

## 2024-06-28 MED ORDER — ACETAMINOPHEN 500 MG PO TABS
1000.0000 mg | ORAL_TABLET | Freq: Four times a day (QID) | ORAL | Status: DC
  Administered 2024-06-28 – 2024-07-01 (×8): 1000 mg via ORAL
  Filled 2024-06-28 (×10): qty 2

## 2024-06-28 MED ORDER — HYDROMORPHONE HCL 1 MG/ML IJ SOLN: 0.5000 mg | INTRAMUSCULAR | Status: DC | PRN

## 2024-06-28 MED ORDER — PHENYLEPHRINE HCL-NACL 20-0.9 MG/250ML-% IV SOLN
INTRAVENOUS | Status: DC | PRN
  Administered 2024-06-28: 40 ug/min via INTRAVENOUS

## 2024-06-28 MED ORDER — PROPOFOL 10 MG/ML IV BOLUS
INTRAVENOUS | Status: AC
  Filled 2024-06-28: qty 20

## 2024-06-28 MED ORDER — LACTATED RINGERS IV SOLN: INTRAVENOUS | Status: DC

## 2024-06-28 MED ORDER — PROPOFOL 500 MG/50ML IV EMUL
INTRAVENOUS | Status: DC | PRN
  Administered 2024-06-28: 90 mg via INTRAVENOUS

## 2024-06-28 MED ORDER — ORAL CARE MOUTH RINSE: 15.0000 mL | Freq: Once | OROMUCOSAL | Status: DC

## 2024-06-28 MED ORDER — SUGAMMADEX SODIUM 200 MG/2ML IV SOLN
INTRAVENOUS | Status: DC | PRN
  Administered 2024-06-28: 200 mg via INTRAVENOUS

## 2024-06-28 MED ORDER — PHENYLEPHRINE 80 MCG/ML (10ML) SYRINGE FOR IV PUSH (FOR BLOOD PRESSURE SUPPORT)
PREFILLED_SYRINGE | INTRAVENOUS | Status: DC | PRN
  Administered 2024-06-28 (×3): 160 ug via INTRAVENOUS

## 2024-06-28 MED ORDER — OXYCODONE HCL 5 MG/5ML PO SOLN: 5.0000 mg | Freq: Once | ORAL | Status: DC | PRN

## 2024-06-28 MED ORDER — PROCHLORPERAZINE EDISYLATE 10 MG/2ML IJ SOLN
10.0000 mg | INTRAMUSCULAR | Status: DC | PRN
  Administered 2024-06-28 – 2024-06-29 (×2): 10 mg via INTRAVENOUS
  Filled 2024-06-28 (×2): qty 2

## 2024-06-28 MED ORDER — BUPIVACAINE HCL (PF) 0.5 % IJ SOLN
INTRAMUSCULAR | Status: DC | PRN
Start: 2024-06-28 — End: 2024-06-28
  Administered 2024-06-28: 30 mL

## 2024-06-28 MED ORDER — ONDANSETRON HCL 4 MG/2ML IJ SOLN
4.0000 mg | Freq: Once | INTRAMUSCULAR | Status: AC | PRN
  Administered 2024-06-28: 4 mg via INTRAVENOUS

## 2024-06-28 MED ORDER — ONDANSETRON HCL 4 MG/2ML IJ SOLN
INTRAMUSCULAR | Status: AC
  Filled 2024-06-28: qty 2

## 2024-06-28 MED ORDER — TRAMADOL HCL 50 MG PO TABS
50.0000 mg | ORAL_TABLET | Freq: Four times a day (QID) | ORAL | Status: DC | PRN
  Administered 2024-06-29: 50 mg via ORAL
  Filled 2024-06-28: qty 1

## 2024-06-28 MED ORDER — BUPIVACAINE HCL (PF) 0.5 % IJ SOLN
INTRAMUSCULAR | Status: AC
  Filled 2024-06-28: qty 30

## 2024-06-28 MED ORDER — DEXAMETHASONE SODIUM PHOSPHATE 10 MG/ML IJ SOLN
INTRAMUSCULAR | Status: AC
  Filled 2024-06-28: qty 1

## 2024-06-28 MED ORDER — ROCURONIUM BROMIDE 10 MG/ML (PF) SYRINGE
PREFILLED_SYRINGE | INTRAVENOUS | Status: DC | PRN
  Administered 2024-06-28: 50 mg via INTRAVENOUS

## 2024-06-28 MED ORDER — LACTATED RINGERS IV SOLN: INTRAVENOUS | Status: DC | PRN

## 2024-06-28 MED ORDER — CHLORHEXIDINE GLUCONATE 0.12 % MT SOLN: 15.0000 mL | Freq: Once | OROMUCOSAL | Status: DC

## 2024-06-28 MED ORDER — OXYCODONE HCL 5 MG PO TABS: 5.0000 mg | ORAL_TABLET | Freq: Once | ORAL | Status: DC | PRN

## 2024-06-28 SURGICAL SUPPLY — 39 items
BAG HAMPER (MISCELLANEOUS) ×2 IMPLANT
COUNTER NDL MAGNETIC 40 RED (SET/KITS/TRAYS/PACK) ×2 IMPLANT
COUNTER NEEDLE MAGNETIC 40 RED (SET/KITS/TRAYS/PACK) ×1 IMPLANT
COVER LIGHT HANDLE STERIS (MISCELLANEOUS) ×8 IMPLANT
DRSG OPSITE POSTOP 4X8 (GAUZE/BANDAGES/DRESSINGS) IMPLANT
ELECTRODE REM PT RTRN 9FT ADLT (ELECTROSURGICAL) ×2 IMPLANT
GAUZE 4X4 16PLY ~~LOC~~+RFID DBL (SPONGE) ×2 IMPLANT
GAUZE SPONGE 4X4 12PLY STRL (GAUZE/BANDAGES/DRESSINGS) ×2 IMPLANT
GLOVE BIO SURGEON STRL SZ 6.5 (GLOVE) ×2 IMPLANT
GLOVE BIOGEL PI IND STRL 6.5 (GLOVE) ×2 IMPLANT
GLOVE BIOGEL PI IND STRL 7.0 (GLOVE) ×4 IMPLANT
GOWN STRL REUS W/TWL LRG LVL3 (GOWN DISPOSABLE) ×12 IMPLANT
INST SET MAJOR GENERAL (KITS) ×2 IMPLANT
KIT TURNOVER KIT A (KITS) ×2 IMPLANT
LIGASURE IMPACT 36 18CM CVD LR (INSTRUMENTS) ×2 IMPLANT
MANIFOLD NEPTUNE II (INSTRUMENTS) ×2 IMPLANT
NDL HYPO 18GX1.5 BLUNT FILL (NEEDLE) ×2 IMPLANT
NDL HYPO 21X1.5 SAFETY (NEEDLE) ×2 IMPLANT
NEEDLE HYPO 18GX1.5 BLUNT FILL (NEEDLE) ×1 IMPLANT
NEEDLE HYPO 21X1.5 SAFETY (NEEDLE) ×1 IMPLANT
NS IRRIG 1000ML POUR BTL (IV SOLUTION) ×4 IMPLANT
PACK COLON (CUSTOM PROCEDURE TRAY) ×2 IMPLANT
PAD ARMBOARD POSITIONER FOAM (MISCELLANEOUS) ×2 IMPLANT
PENCIL SMOKE EVACUATOR COATED (MISCELLANEOUS) ×2 IMPLANT
POSITIONER HEAD 8X9X4 ADT (SOFTGOODS) ×2 IMPLANT
RELOAD PROXIMATE 75MM BLUE (ENDOMECHANICALS) ×2 IMPLANT
RELOAD STAPLE 75 3.8 BLU REG (ENDOMECHANICALS) IMPLANT
RETRACTOR WND ALEXIS-O 25 LRG (MISCELLANEOUS) IMPLANT
SPONGE T-LAP 18X18 ~~LOC~~+RFID (SPONGE) ×6 IMPLANT
STAPLER GUN LINEAR PROX 60 (STAPLE) ×2 IMPLANT
STAPLER PROXIMATE 75MM BLUE (STAPLE) IMPLANT
STAPLER VISISTAT (STAPLE) ×2 IMPLANT
SUT CHROMIC 3 0 SH 27 (SUTURE) IMPLANT
SUT PDS AB CT VIOLET #0 27IN (SUTURE) ×4 IMPLANT
SUT SILK 3 0 SH CR/8 (SUTURE) ×2 IMPLANT
SUT VIC AB 3-0 SH 27X BRD (SUTURE) ×2 IMPLANT
SYR 30ML LL (SYRINGE) ×2 IMPLANT
TRAY FOLEY MTR SLVR 16FR STAT (SET/KITS/TRAYS/PACK) ×2 IMPLANT
YANKAUER SUCT BULB TIP 10FT TU (MISCELLANEOUS) ×2 IMPLANT

## 2024-06-28 NOTE — Anesthesia Procedure Notes (Signed)
 Procedure Name: Intubation Date/Time: 06/28/2024 1:19 PM  Performed by: Cordella Elvie HERO, CRNAPre-anesthesia Checklist: Patient identified, Emergency Drugs available, Suction available, Patient being monitored and Timeout performed Patient Re-evaluated:Patient Re-evaluated prior to induction Oxygen Delivery Method: Circle system utilized Preoxygenation: Pre-oxygenation with 100% oxygen Induction Type: IV induction Ventilation: Mask ventilation without difficulty Laryngoscope Size: Mac and 3 Grade View: Grade I Tube type: Oral Tube size: 7.0 mm Number of attempts: 1 Airway Equipment and Method: Stylet Placement Confirmation: ETT inserted through vocal cords under direct vision, positive ETCO2, CO2 detector and breath sounds checked- equal and bilateral Secured at: 22 cm Tube secured with: Tape Dental Injury: Teeth and Oropharynx as per pre-operative assessment

## 2024-06-28 NOTE — Progress Notes (Signed)
 PROGRESS NOTE   Hailey Graham  FMW:[id number] DOB: 05/27/1928 DOA: 06/22/2024 PCP: Sheryle Carwin, MD   Chief Complaint  Patient presents with   Anemia   Level of care: Med-Surg  Brief Admission History:  89 y.o. female with medical history significant of paroxysmal atrial fibrillation on Xarelto  and no other medical history who saw her cardiologist last Thursday for routine checkup, she was supposed to go for the lab check next day Friday but some reason order was not sent to the laboratory.  She went yesterday, blood was drawn, however CBC resulted today with hemoglobin under 7 so she was called by her cardiologist Dr. Okey to present to the ED.  Patient herself has no complaints at all.  She denies any nausea, vomiting, chest pain, shortness of breath, palpitation, any problem with urination or bowel bowel movements, no hematochezia, hematemesis or melena.   ED Course: Upon arrival to ED, patient fairly stable hemodynamically.  CBC showed hemoglobin of 5.6.  FOBT positive.  BMP not drawn.  2 units of PRBC transfusion ordered.  ED physician consulted GI, they recommended admission.  Hospitalist called for admission.   Assessment and Plan:  Acute blood loss anemia secondary to lower GI bleed, POA: Hemoglobin 5.6.  2 units PRBC transfusion ordered by ED.   GI is consulted and colonoscopy with findings of a large cecal mass.  Holding Xarelto . Cecal Mass - consulted to general surgery; pt and family considering hemicolectomy after weighing the risks, benefits --appreciate GI and surgery consult --pt was offered right hemicolectomy and afted discussing with family and surgeon she would like to proceed with surgery --pt to OR on 7/7 for right hemicolectomy and possible liver biopsy with Dr Kallie --follow up surgery orders for postop care   Paroxysmal atrial fibrillation: Currently in sinus rhythm.  Resumed home Toprol -XL for rate control. Pt says she takes 12.5 mg twice daily which we have  resumed how she takes at home    Anxiety: Resumed home dose of Xanax .  History of left breast cancer -- resumed home arimedex   Hypokalemia -- repleted   DVT prophylaxis: SCDs Code Status: DNR  Family Communication: bedside 7/4, 7/5  Disposition home     Consultants:  GI services  Procedures:   Antimicrobials:    Subjective: Pt agreeable to surgery today, she is resting but a little tired, not sleeping well in hospital.        Objective: Vitals:   06/27/24 2025 06/28/24 0539 06/28/24 0842 06/28/24 1130  BP: 131/76 115/63 114/63 119/63  Pulse: 74 74 77 82  Resp: 16 18  17   Temp: 98.2 F (36.8 C) 98.6 F (37 C)  98.1 F (36.7 C)  TempSrc: Oral Oral    SpO2: 100% 92%  93%  Weight:      Height:        Intake/Output Summary (Last 24 hours) at 06/28/2024 1229 Last data filed at 06/28/2024 0604 Gross per 24 hour  Intake 120 ml  Output 450 ml  Net -330 ml    Filed Weights   06/22/24 1732 06/22/24 2047  Weight: 74 kg 74 kg   Examination:  General exam: Appears calm and comfortable  Respiratory system: Clear to auscultation. Respiratory effort normal. Cardiovascular system: normal S1 & S2 heard. No JVD, murmurs, rubs, gallops or clicks. No pedal edema. Gastrointestinal system: Abdomen is nondistended, soft and nontender. No organomegaly or masses felt. Normal bowel sounds heard. Central nervous system: Alert and oriented. No focal neurological deficits. Extremities:  diffuse severe osteoarthritis of knees, R>L Skin: No rashes, lesions or ulcers. Psychiatry: Judgement and insight appear normal. Mood & affect appropriate.   Data Reviewed: I have personally reviewed following labs and imaging studies  CBC: Recent Labs  Lab 06/24/24 0407 06/25/24 0435 06/26/24 0326 06/27/24 0343 06/28/24 0518  WBC 8.2 9.1 11.3* 9.9 10.1  HGB 9.9* 10.5* 9.7* 9.3* 9.7*  HCT 34.2* 34.7* 33.5* 32.5* 32.0*  MCV 76.2* 76.1* 76.8* 78.1* 77.1*  PLT 267 264 256 213 233    Basic  Metabolic Panel: Recent Labs  Lab 06/24/24 0407 06/25/24 0435 06/26/24 0326 06/27/24 0343 06/28/24 0518  NA 140 136 133* 133* 135  K 3.7 3.3* 4.0 3.8 4.0  CL 114* 110 110 106 107  CO2 19* 20* 21* 18* 17*  GLUCOSE 95 97 101* 84 80  BUN 12 10 13 17 23   CREATININE 0.64 0.60 0.75 0.64 0.70  CALCIUM 9.7 9.2 9.4 9.3 9.4  MG 2.1  --   --   --   --     CBG: No results for input(s): GLUCAP in the last 168 hours.  Recent Results (from the past 240 hours)  Surgical PCR screen     Status: None   Collection Time: 06/27/24 10:09 AM   Specimen: Nasal Mucosa; Nasal Swab  Result Value Ref Range Status   MRSA, PCR NEGATIVE NEGATIVE Final   Staphylococcus aureus NEGATIVE NEGATIVE Final    Comment: (NOTE) The Xpert SA Assay (FDA approved for NASAL specimens in patients 23 years of age and older), is one component of a comprehensive surveillance program. It is not intended to diagnose infection nor to guide or monitor treatment. Performed at [facility], [address] [place], [place] [address]      Radiology Studies: No results found.  Scheduled Meds:  [MAR Hold] anastrozole   1 mg Oral QPM   chlorhexidine        [MAR Hold] dorzolamide -timolol   1 drop Both Eyes BID   [MAR Hold] ezetimibe   10 mg Oral Daily   And   [MAR Hold] simvastatin   20 mg Oral q1800   [MAR Hold] metoprolol  succinate  12.5 mg Oral BID   [MAR Hold] mupirocin  ointment  1 Application Nasal BID   [MAR Hold] pantoprazole  (PROTONIX ) IV  40 mg Intravenous Q12H   [MAR Hold] pilocarpine   1 drop Right Eye QID   [MAR Hold] sodium chloride  flush  3 mL Intravenous Q12H   Continuous Infusions:  cefoTEtan  (CEFOTAN ) IV      LOS: 2 days   Time spent: 55 mins  Jahki Witham Vicci, MD How to contact the [facility] Attending or Consulting provider 7A - 7P or covering provider during after hours 7P -7A, for this patient?  Check the care team in [facility] and look for a) attending/consulting TRH provider listed and b) the TRH team  listed Log into [email or web address] to find provider on call.  Locate the TRH provider you are looking for under [facility] and page to a number that you can be directly reached. If you still have difficulty reaching the provider, please page the [facility] (Director on Call) for the Hospitalists listed on amion for assistance.  06/28/2024, 12:29 PM

## 2024-06-28 NOTE — Progress Notes (Signed)
 RN transported patient to the floor.

## 2024-06-28 NOTE — Interval H&P Note (Signed)
 History and Physical Interval Note:  06/28/2024 11:19 AM  Hailey Graham  has presented today for surgery, with the diagnosis of cecal cancer, liver lesion.  The various methods of treatment have been discussed with the patient and family. After consideration of risks, benefits and other options for treatment, the patient has consented to  Procedure(s): COLECTOMY, RIGHT, possible liver biopsy (Right) as a surgical intervention.  The patient's history has been reviewed, patient examined, no change in status, stable for surgery.  I have reviewed the patient's chart and labs.  Questions were answered to the patient's satisfaction.     Hailey Graham

## 2024-06-28 NOTE — Anesthesia Preprocedure Evaluation (Signed)
 Anesthesia Evaluation  Patient identified by MRN, date of birth, ID band Patient awake    Reviewed: Allergy & Precautions, H&P , NPO status , Patient's Chart, lab work & pertinent test results, reviewed documented beta blocker date and time   History of Anesthesia Complications (+) PONV and history of anesthetic complications  Airway Mallampati: II  TM Distance: >3 FB Neck ROM: full    Dental no notable dental hx.    Pulmonary neg pulmonary ROS   Pulmonary exam normal breath sounds clear to auscultation       Cardiovascular Exercise Tolerance: Good hypertension, negative cardio ROS  Rhythm:regular Rate:Normal     Neuro/Psych  PSYCHIATRIC DISORDERS Anxiety Depression    negative neurological ROS  negative psych ROS   GI/Hepatic negative GI ROS, Neg liver ROS,GERD  ,,  Endo/Other  negative endocrine ROS    Renal/GU negative Renal ROS  negative genitourinary   Musculoskeletal   Abdominal   Peds  Hematology negative hematology ROS (+) Blood dyscrasia, anemia   Anesthesia Other Findings   Reproductive/Obstetrics negative OB ROS                              Anesthesia Physical Anesthesia Plan  ASA: 3  Anesthesia Plan: General and General ETT   Post-op Pain Management:    Induction:   PONV Risk Score and Plan: Ondansetron   Airway Management Planned:   Additional Equipment:   Intra-op Plan:   Post-operative Plan:   Informed Consent: I have reviewed the patients History and Physical, chart, labs and discussed the procedure including the risks, benefits and alternatives for the proposed anesthesia with the patient or authorized representative who has indicated his/her understanding and acceptance.     Dental Advisory Given  Plan Discussed with: CRNA  Anesthesia Plan Comments:         Anesthesia Quick Evaluation

## 2024-06-28 NOTE — Progress Notes (Addendum)
 [facility]  Updated Alice on the phone. Clear diet, PRN for pain, scheduled tylenol . Foley out after case.  Manuelita Pander, MD [facility] [address] [place], [place] [address] [phone] (office)

## 2024-06-28 NOTE — Op Note (Signed)
 [facility] Operative Note  06/28/24  Preoperative Diagnosis: Cecal mass and liver lesion    Postoperative Diagnosis: Same   Procedure(s) Performed: Right hemicolectomy and wedge liver biopsy    Surgeon: Manuelita BROCKS. Kallie, MD   Assistants: No qualified resident was available    Anesthesia: General endotracheal   Anesthesiologist: Kendell Yvonna PARAS, MD    Specimens: Right colon, liver biopsy    Estimated Blood Loss: Minimal   Blood Replacement: None    Complications: None   Wound Class: Clean contaminated    Operative Indications: [name] is a 89 yo with a cecal mass concerning for cancer and liver mass on CT scan. She had a colonoscopy that demonstrated this mass and sequela of bleeding. We discussed excision due to risk of obstruction and growth and risk of bleeding, infection, ureter injury, biopsy of liver if possible and risk of bleeding from that biopsy.   Findings: Cecal mass with tattoo in the ascending colon    Procedure: The patient was taken to the operating room and placed supine. General endotracheal anesthesia was induced. Intravenous antibiotics were administered per protocol.  An orogastric tube positioned to decompress the stomach. A foley was placed. The abdomen was prepped and draped in the usual sterile fashion.   A midline incision was made and carried down to the fascia and this was opened with care with scissors. A wound protector was placed. The colon was redundant and easily eviscerated out of the abdomen with a long mesentery. I was able to free up the white line of Toldt with cautery. I noted and protected the right ureter. I took down the hepatic flexure with ligasure, and freed up the transverse colon from the stomach. From here the distal and proximal points of transection were noted and taken with a 75 mm linear cutting stapler. The ligasure took the mesentery ensure adequate sampling for lymph node harvest. The duodenum was noted  and protected. The sample was sent to pathology.   I then felt the left lobe of the liver, and felt a hard mass that was present anteriorly and posteriorly on the left lobe. I took a wedge biopsy X 2 (one on the posterior aspect of the lobe and one on the anterior aspect of the lobe). Hemostasis was achieved with cautery. Surgicel was placed into the defects. No bleeding was noted.   I then turned to the anastomosis. The small bowel and transverse colon were placed side by side and enterotomies were made. No twisting of the bowel was noted. The common staple line was formed with a 75 mm linear cutting stapler antimesenteric fashion, and the common channel was closed with a TA 60 stapler. The staple line was oversewn with 3-0 Lembert silk suture. Two crotch sutures of 3-0 Lembert silk were done. The mesentery defect was closed with 3-0 Chromic gut.    I checked the liver biopsies again and this was hemostatic. Irrigation was performed.   The entire team changed gown and gloves and new closing equipment and drapes were placed per protocol. The midline was closed in the normal fashion with 0 PDS suture. Irrigation was performed.  Marcaine  was injected. The skin was closed with staples.  A honeycomb dressing was placed.   Final inspection revealed acceptable hemostasis. All counts were correct at the end of the case. The patient was awakened from anesthesia and extubated without complication.  The foley was removed.  The patient went to the PACU in stable condition.   Manuelita  Kallie, MD [facility] [address] [place], [place] [address] [phone] (office)

## 2024-06-28 NOTE — Progress Notes (Signed)
 GI signing off. Please reach out if needed.

## 2024-06-29 ENCOUNTER — Encounter (HOSPITAL_COMMUNITY): Payer: Self-pay | Admitting: General Surgery

## 2024-06-29 DIAGNOSIS — D500 Iron deficiency anemia secondary to blood loss (chronic): Secondary | ICD-10-CM | POA: Diagnosis not present

## 2024-06-29 DIAGNOSIS — R195 Other fecal abnormalities: Secondary | ICD-10-CM | POA: Diagnosis not present

## 2024-06-29 DIAGNOSIS — K922 Gastrointestinal hemorrhage, unspecified: Secondary | ICD-10-CM | POA: Diagnosis not present

## 2024-06-29 LAB — BASIC METABOLIC PANEL WITH GFR
Anion gap: 11 (ref 5–15)
BUN: 27 mg/dL — ABNORMAL HIGH (ref 8–23)
CO2: 18 mmol/L — ABNORMAL LOW (ref 22–32)
Calcium: 9.9 mg/dL (ref 8.9–10.3)
Chloride: 108 mmol/L (ref 98–111)
Creatinine, Ser: 0.81 mg/dL (ref 0.44–1.00)
GFR, Estimated: >60 mL/min (ref >60)
Glucose, Bld: 141 mg/dL — ABNORMAL HIGH (ref 70–99)
Potassium: 4.4 mmol/L (ref 3.5–5.1)
Sodium: 137 mmol/L (ref 135–145)

## 2024-06-29 LAB — CBC
HCT: 34.8 % — ABNORMAL LOW (ref 36.0–46.0)
Hemoglobin: 10 g/dL — ABNORMAL LOW (ref 12.0–15.0)
MCH: 22.3 pg — ABNORMAL LOW (ref 26.0–34.0)
MCHC: 28.7 g/dL — ABNORMAL LOW (ref 30.0–36.0)
MCV: 77.7 fL — ABNORMAL LOW (ref 80.0–100.0)
Platelets: 292 K/uL (ref 150–400)
RBC: 4.48 MIL/uL (ref 3.87–5.11)
RDW: 23.2 % — ABNORMAL HIGH (ref 11.5–15.5)
WBC: 14.2 K/uL — ABNORMAL HIGH (ref 4.0–10.5)
nRBC: 0 % (ref 0.0–0.2)

## 2024-06-29 LAB — SURGICAL PATHOLOGY

## 2024-06-29 MED ORDER — SODIUM BICARBONATE 650 MG PO TABS
650.0000 mg | ORAL_TABLET | Freq: Three times a day (TID) | ORAL | Status: DC
  Administered 2024-06-29 – 2024-07-02 (×8): 650 mg via ORAL
  Filled 2024-06-29 (×9): qty 1

## 2024-06-29 NOTE — Plan of Care (Signed)
  Problem: Clinical Measurements: Goal: Will remain free from infection Outcome: Progressing Goal: Respiratory complications will improve Outcome: Progressing   Problem: Activity: Goal: Risk for activity intolerance will decrease Outcome: Progressing   Problem: Nutrition: Goal: Adequate nutrition will be maintained Outcome: Progressing   Problem: Coping: Goal: Level of anxiety will decrease Outcome: Progressing   Problem: Pain Managment: Goal: General experience of comfort will improve and/or be controlled Outcome: Progressing   Problem: Safety: Goal: Ability to remain free from injury will improve Outcome: Progressing

## 2024-06-29 NOTE — TOC Initial Note (Signed)
 Transition of Care [facility]) - Initial/Assessment Note    Patient Details  Name: Hailey Graham MRN: [id number] Date of Birth: 10-20-1928  [facility]) CM/SW Contact:    Lucie Lunger, LCSWA Phone Number: 06/29/2024, 4:06 PM  Clinical Narrative:                 CSW updated that pt is requesting SNF placement once medically stable. CSW spoke with pt about this, she states that she would like SNF placement, CSW explained that PT will need to work with her and make SNF recommendation, pt is understanding. Pt states her preference is [facility] if possible. CSW to await PT eval before sending SNF referral out. TOC to follow.   Expected Discharge Plan: Skilled Nursing Facility Barriers to Discharge: Continued Medical Work up   Patient Goals and CMS Choice Patient states their goals for this hospitalization and ongoing recovery are:: get stronger CMS Medicare.gov Compare Post Acute Care list provided to:: Patient Choice offered to / list presented to : Patient [place] ownership interest in [facility].provided to:: Patient    Expected Discharge Plan and Services In-house Referral: Clinical Social Work Discharge Planning Services: CM Consult Post Acute Care Choice: Skilled Nursing Facility Living arrangements for the past 2 months: Single Family Home                                      Prior Living Arrangements/Services Living arrangements for the past 2 months: Single Family Home Lives with:: Self Patient language and need for interpreter reviewed:: Yes Do you feel safe going back to the place where you live?: Yes      Need for Family Participation in Patient Care: Yes (Comment) Care giver support system in place?: Yes (comment)   Criminal Activity/Legal Involvement Pertinent to Current Situation/Hospitalization: No - Comment as needed  Activities of Daily Living   ADL Screening (condition at time of admission) Independently performs ADLs?: Yes  (appropriate for developmental age) Is the patient deaf or have difficulty hearing?: No Does the patient have difficulty seeing, even when wearing glasses/contacts?: No Does the patient have difficulty concentrating, remembering, or making decisions?: No  Permission Sought/Granted                  Emotional Assessment Appearance:: Appears stated age Attitude/Demeanor/Rapport: Engaged Affect (typically observed): Accepting Orientation: : Oriented to Self, Oriented to Place, Oriented to  Time, Oriented to Situation Alcohol / Substance Use: Not Applicable Psych Involvement: No (comment)  Admission diagnosis:  Iron deficiency anemia due to chronic blood loss [D50.0] UGI bleed [K92.2] Upper GI bleed [K92.2] Lower GI bleed [K92.2] Patient Active Problem List   Diagnosis Date Noted   Liver mass, left lobe 06/28/2024   Lower GI bleed 06/26/2024   Cecal cancer (HCC) 06/25/2024   Iron deficiency anemia due to chronic blood loss 06/23/2024   Heme positive stool 06/23/2024   Upper GI bleed 06/22/2024   Abdominal pain, epigastric 08/01/2020   Menopause 11/02/2019   Situational anxiety 11/02/2019   Breast cancer of upper-outer quadrant of left female breast (HCC) 11/24/2015   Hx of diagnostic tests 03/22/2013   Chronic anticoagulation 02/19/2013   Paroxysmal atrial fibrillation (HCC)    Gastroesophageal reflux disease    Hyperlipidemia    Varicose veins of bilateral lower extremities with other complications 02/18/2012   PCP:  Sheryle Carwin, MD Pharmacy:   [place]  [address] - [place], [place] - [address] [place] [address] Phone: [phone] Fax: [phone]     Social Drivers of Health (SDOH) Social History: SDOH Screenings   Food Insecurity: No Food Insecurity (06/22/2024)  Housing: Low Risk  (06/22/2024)  Transportation Needs: No Transportation Needs (06/22/2024)  Utilities: Not At Risk (06/22/2024)  Depression (PHQ2-9): Low Risk  (02/21/2020)   Social Connections: Socially Isolated (06/22/2024)  Tobacco Use: Low Risk  (06/28/2024)   SDOH Interventions:     Readmission Risk Interventions     No data to display

## 2024-06-29 NOTE — Progress Notes (Signed)
 [facility]  Doing well. No Bms but having flatus.  BP 135/70 (BP Location: Left Arm)   Pulse 71   Temp 98.3 F (36.8 C) (Oral)   Resp 16   Ht 5' 6 (1.676 m)   Wt 71.8 kg   SpO2 93%   BMI 25.55 kg/m  Soft, nondistended, appropriately tender, honeycomb without staining, binder in place  Patient s/p right hemicolectomy and liver biopsy. Doing well.   Full liquid diet Entereg  PRN for pain, scheduled tylenol  Needs Incentive spirometry WBC Up likely related, H&H Stable  Awaiting bowel movements  Manuelita Pander, MD [facility] [address] [place], [place] [address] [phone] (office)

## 2024-06-29 NOTE — Progress Notes (Signed)
 Mobility Specialist Progress Note:    06/29/24 1038  Therapy Vitals  Pulse Rate 65  Resp 16  BP (!) 157/79  Patient Position (if appropriate) Sitting  Oxygen Therapy  SpO2 97 % (pt. ambulated to chair)  O2 Device Nasal Cannula  O2 Flow Rate (L/min) 2 L/min  Mobility  Activity Transferred from bed to chair;Stood at bedside;Ambulated with assistance in room  Level of Assistance Moderate assist, patient does 50-74%  Assistive Device Front wheel walker  Distance Ambulated (ft) 4 ft  Range of Motion/Exercises Active;All extremities  Activity Response Tolerated well  Mobility Referral Yes  Mobility visit 1 Mobility  Mobility Specialist Start Time (ACUTE ONLY) 1015  Mobility Specialist Stop Time (ACUTE ONLY) 1038  Mobility Specialist Time Calculation (min) (ACUTE ONLY) 23 min   Pt received in bed, agreeable to mobility. Required Mod-MaxA to sand and transfer with RW. Tolerated well, c/o shoulder pain after transfer. RN in room and notified. Left pt with RN, all needs met.   Sherrilee Ditty Mobility Specialist Please contact via Special educational needs teacher or  Rehab office at [phone]

## 2024-06-29 NOTE — Transfer of Care (Signed)
 Immediate Anesthesia Transfer of Care Note  Patient: Hailey Graham  Procedure(s) Performed: RIGHT HEMI-COLECTOMY WITH  LIVER BIOPSY (Right: Abdomen)  Patient Location: PACU  Anesthesia Type:General  Level of Consciousness: awake, alert , oriented, and patient cooperative  Airway & Oxygen Therapy: Patient Spontanous Breathing and Patient connected to face mask oxygen  Post-op Assessment: Report given to RN, Post -op Vital signs reviewed and stable, and Patient moving all extremities X 4  Post vital signs: Reviewed and stable  Last Vitals:  Vitals Value Taken Time  BP 135/70 06/29/24 04:32  Temp 36.8 C 06/29/24 04:32  Pulse 71 06/29/24 04:32  Resp 16 06/29/24 04:32  SpO2 94 % 06/29/24 07:24    Last Pain:  Vitals:   06/29/24 0724  TempSrc:   PainSc: 0-No pain      Patients Stated Pain Goal: 4 (06/28/24 1530)  Complications: No notable events documented.

## 2024-06-29 NOTE — Progress Notes (Signed)
 PROGRESS NOTE   Hailey Graham  FMW:[id number] DOB: 1928/04/29 DOA: 06/22/2024 PCP: Sheryle Carwin, MD   Chief Complaint  Patient presents with   Anemia   Level of care: Med-Surg  Brief Admission History:  89 y.o. female with medical history significant of paroxysmal atrial fibrillation on Xarelto  and no other medical history who saw her cardiologist last Thursday for routine checkup, she was supposed to go for the lab check next day Friday but some reason order was not sent to the laboratory.  She went yesterday, blood was drawn, however CBC resulted today with hemoglobin under 7 so she was called by her cardiologist Dr. Okey to present to the ED.  Patient herself has no complaints at all.  She denies any nausea, vomiting, chest pain, shortness of breath, palpitation, any problem with urination or bowel bowel movements, no hematochezia, hematemesis or melena.   ED Course: Upon arrival to ED, patient fairly stable hemodynamically.  CBC showed hemoglobin of 5.6.  FOBT positive.  BMP not drawn.  2 units of PRBC transfusion ordered.  ED physician consulted GI, they recommended admission.  Hospitalist called for admission.   Assessment and Plan:  Acute blood loss anemia secondary to lower GI bleed, POA: Hemoglobin 5.6.  2 units PRBC transfusion ordered by ED.   GI is consulted and colonoscopy with findings of a large cecal mass.  Holding Xarelto . Cecal Mass - consulted to general surgery; pt and family decided to have the hemicolectomy after weighing the risks, benefits --appreciate GI and surgery consult --pt was offered right hemicolectomy and afted discussing with family and surgeon she would like to proceed with surgery --pt was taken to OR on 7/7 for right hemicolectomy and liver biopsy with Dr Kallie --Postop s/p right hemicolectomy and liver biopsy 06/28/24 - she is recovering well postoperatively and surgery team advanced diet to full liquids  --working on ambulating, getting up to chair and  requested PT eval --anticipating need for short term rehab; pt requesting [facility], TOC team updated    Paroxysmal atrial fibrillation: Currently in sinus rhythm.  Resumed home Toprol -XL for rate control. Pt says she takes 12.5 mg twice daily which we have resumed how she takes at home    Metabolic acidosis -- suspect could be coming from the cancer -- added sodium bicarbonate  tablets on 7/8 -- recheck BMP in AM   Anxiety: Resumed home dose of Xanax .  History of left breast cancer -- resumed home arimedex   Hypokalemia -- repleted   Leukocytosis -- reactive from surgery -- repeat labs in AM   DVT prophylaxis: SCDs Code Status: DNR  Family Communication: bedside 7/4, 7/5  Disposition anticipating need for SNF     Consultants:  GI services  Procedures:   Antimicrobials:    Subjective: Pt agreeable to surgery today, she is resting but a little tired, not sleeping well in hospital.        Objective: Vitals:   06/29/24 0724 06/29/24 0902 06/29/24 1026 06/29/24 1038  BP:    (!) 157/79  Pulse:    65  Resp:    16  Temp:      TempSrc:      SpO2: 94% 93% 94% 97%  Weight:      Height:        Intake/Output Summary (Last 24 hours) at 06/29/2024 1120 Last data filed at 06/29/2024 0443 Gross per 24 hour  Intake 813.33 ml  Output 720 ml  Net 93.33 ml    [employer]  06/22/24 1732 06/22/24 2047 06/29/24 0432  Weight: 74 kg 74 kg 71.8 kg   Examination:  General exam: Appears calm and comfortable  Respiratory system: Clear to auscultation. Respiratory effort normal. Cardiovascular system: normal S1 & S2 heard. No JVD, murmurs, rubs, gallops or clicks. No pedal edema. Gastrointestinal system: Abdomen is nondistended, soft and nontender. No organomegaly or masses felt. Normal bowel sounds heard. Central nervous system: Alert and oriented. No focal neurological deficits. Extremities: diffuse severe osteoarthritis of knees, R>L Skin: No rashes, lesions or  ulcers. Psychiatry: Judgement and insight appear normal. Mood & affect appropriate.   Data Reviewed: I have personally reviewed following labs and imaging studies  CBC: Recent Labs  Lab 06/25/24 0435 06/26/24 0326 06/27/24 0343 06/28/24 0518 06/29/24 0422  WBC 9.1 11.3* 9.9 10.1 14.2*  HGB 10.5* 9.7* 9.3* 9.7* 10.0*  HCT 34.7* 33.5* 32.5* 32.0* 34.8*  MCV 76.1* 76.8* 78.1* 77.1* 77.7*  PLT 264 256 213 233 292    Basic Metabolic Panel: Recent Labs  Lab 06/24/24 0407 06/25/24 0435 06/26/24 0326 06/27/24 0343 06/28/24 0518 06/29/24 0422  NA 140 136 133* 133* 135 137  K 3.7 3.3* 4.0 3.8 4.0 4.4  CL 114* 110 110 106 107 108  CO2 19* 20* 21* 18* 17* 18*  GLUCOSE 95 97 101* 84 80 141*  BUN 12 10 13 17 23  27*  CREATININE 0.64 0.60 0.75 0.64 0.70 0.81  CALCIUM 9.7 9.2 9.4 9.3 9.4 9.9  MG 2.1  --   --   --   --   --     CBG: No results for input(s): GLUCAP in the last 168 hours.  Recent Results (from the past 240 hours)  Surgical PCR screen     Status: None   Collection Time: 06/27/24 10:09 AM   Specimen: Nasal Mucosa; Nasal Swab  Result Value Ref Range Status   MRSA, PCR NEGATIVE NEGATIVE Final   Staphylococcus aureus NEGATIVE NEGATIVE Final    Comment: (NOTE) The Xpert SA Assay (FDA approved for NASAL specimens in patients 16 years of age and older), is one component of a comprehensive surveillance program. It is not intended to diagnose infection nor to guide or monitor treatment. Performed at [facility], [address], [place], [place] [address]      Radiology Studies: No results found.  Scheduled Meds:  acetaminophen   1,000 mg Oral Q6H   alvimopan   12 mg Oral BID   anastrozole   1 mg Oral QPM   dorzolamide -timolol   1 drop Both Eyes BID   ezetimibe   10 mg Oral Daily   And   simvastatin   20 mg Oral q1800   feeding supplement  237 mL Oral BID BM   metoprolol  succinate  12.5 mg Oral BID   mupirocin  ointment  1 Application Nasal BID   pantoprazole   (PROTONIX ) IV  40 mg Intravenous Q12H   pilocarpine   1 drop Right Eye QID   sodium chloride  flush  3 mL Intravenous Q12H   Continuous Infusions:   LOS: 3 days   Time spent: 55 mins  Juanantonio Stolar Vicci, MD How to contact the [facility] Attending or Consulting provider 7A - 7P or covering provider during after hours 7P -7A, for this patient?  Check the care team in [facility] and look for a) attending/consulting TRH provider listed and b) the TRH team listed Log into [email or web address] to find provider on call.  Locate the TRH provider you are looking for under [facility] and page to a number that  you can be directly reached. If you still have difficulty reaching the provider, please page the [facility] (Director on Call) for the Hospitalists listed on amion for assistance.  06/29/2024, 11:20 AM

## 2024-06-30 DIAGNOSIS — D500 Iron deficiency anemia secondary to blood loss (chronic): Secondary | ICD-10-CM | POA: Diagnosis not present

## 2024-06-30 DIAGNOSIS — K922 Gastrointestinal hemorrhage, unspecified: Secondary | ICD-10-CM | POA: Diagnosis not present

## 2024-06-30 DIAGNOSIS — C180 Malignant neoplasm of cecum: Secondary | ICD-10-CM | POA: Diagnosis not present

## 2024-06-30 LAB — BASIC METABOLIC PANEL WITH GFR
Anion gap: 9 (ref 5–15)
BUN: 30 mg/dL — ABNORMAL HIGH (ref 8–23)
CO2: 21 mmol/L — ABNORMAL LOW (ref 22–32)
Calcium: 9.7 mg/dL (ref 8.9–10.3)
Chloride: 106 mmol/L (ref 98–111)
Creatinine, Ser: 0.65 mg/dL (ref 0.44–1.00)
GFR, Estimated: >60 mL/min (ref >60)
Glucose, Bld: 133 mg/dL — ABNORMAL HIGH (ref 70–99)
Potassium: 3.9 mmol/L (ref 3.5–5.1)
Sodium: 136 mmol/L (ref 135–145)

## 2024-06-30 LAB — CBC
HCT: 32.6 % — ABNORMAL LOW (ref 36.0–46.0)
Hemoglobin: 9.7 g/dL — ABNORMAL LOW (ref 12.0–15.0)
MCH: 23 pg — ABNORMAL LOW (ref 26.0–34.0)
MCHC: 29.8 g/dL — ABNORMAL LOW (ref 30.0–36.0)
MCV: 77.3 fL — ABNORMAL LOW (ref 80.0–100.0)
Platelets: 280 K/uL (ref 150–400)
RBC: 4.22 MIL/uL (ref 3.87–5.11)
RDW: 23.2 % — ABNORMAL HIGH (ref 11.5–15.5)
WBC: 13.2 K/uL — ABNORMAL HIGH (ref 4.0–10.5)
nRBC: 0 % (ref 0.0–0.2)

## 2024-06-30 MED ORDER — DOCUSATE SODIUM 100 MG PO CAPS
100.0000 mg | ORAL_CAPSULE | Freq: Two times a day (BID) | ORAL | Status: DC
  Administered 2024-06-30 – 2024-07-02 (×4): 100 mg via ORAL
  Filled 2024-06-30 (×5): qty 1

## 2024-06-30 NOTE — TOC Progression Note (Signed)
 Transition of Care [facility]) - Progression Note    Patient Details  Name: Hailey Graham MRN: [id number] Date of Birth: 1928-04-16  [facility]) CM/SW Contact  Lucie Lunger, [place] Phone Number: 06/30/2024, 11:00 AM  Clinical Narrative:    CSW updated that PT is recommending SNF for pt. CSW to complete referral and send out to [facility] at pts request. TOC to follow.   Expected Discharge Plan: Skilled Nursing Facility Barriers to Discharge: Continued Medical Work up  Expected Discharge Plan and Services In-house Referral: Clinical Social Work Discharge Planning Services: CM Consult Post Acute Care Choice: Skilled Nursing Facility Living arrangements for the past 2 months: Single Family Home                                       Social Determinants of Health (SDOH) Interventions SDOH Screenings   Food Insecurity: No Food Insecurity (06/22/2024)  Housing: Low Risk  (06/22/2024)  Transportation Needs: No Transportation Needs (06/22/2024)  Utilities: Not At Risk (06/22/2024)  Depression (PHQ2-9): Low Risk  (02/21/2020)  Social Connections: Socially Isolated (06/22/2024)  Tobacco Use: Low Risk  (06/28/2024)    Readmission Risk Interventions     No data to display

## 2024-06-30 NOTE — Progress Notes (Signed)
 [facility]  Had Bms. Wanting more to eat. No nausea/ does not like the binder.   BP 114/64 (BP Location: Left Arm)   Pulse 65   Temp 97.9 F (36.6 C) (Oral)   Resp 17   Ht 5' 6 (1.676 m)   Wt 70.9 kg   SpO2 92%   BMI 25.23 kg/m  Soft, nondistended, honeycomb c/d/I with old blood Binder removed while in bed  Patient s/p right hemicolectomy and liver biopsy. Doing well. Path pending.  Planning for  SNF.  Soft diet Entreg off Added colace  Manuelita Pander, MD [facility] [address] [place], [place] [address] [phone] (office)   Future Appointments  Date Time Provider Department Center  07/08/2024 11:45 AM Powell-Butler, Rosaria [place], PT [facility] None  07/14/2024  9:45 AM Pander Manuelita BROCKS, MD [facility] None  11/08/2024  1:00 PM Okey Vina GAILS, MD [facility]  11/23/2024 11:00 AM Odean Potts, MD [facility] None

## 2024-06-30 NOTE — Progress Notes (Signed)
 Mobility Specialist Progress Note:    06/30/24 1130  Mobility  Activity Transferred to/from BSC;Stood at bedside;Transferred from chair to bed  Level of Assistance Moderate assist, patient does 50-74%  Assistive Device Front wheel walker  Distance Ambulated (ft) 4 ft  Range of Motion/Exercises Active;All extremities  Activity Response Tolerated well  Mobility Referral Yes  Mobility visit 1 Mobility  Mobility Specialist Start Time (ACUTE ONLY) 1112  Mobility Specialist Stop Time (ACUTE ONLY) 1130  Mobility Specialist Time Calculation (min) (ACUTE ONLY) 18 min   Pt received requesting assistance. Required ModA to stand and transfer with RW. Tolerated well, asx throughout. Left pt supine, alarm on. All needs met.   Sherrilee Ditty Mobility Specialist Please contact via Special educational needs teacher or  Rehab office at [phone]

## 2024-06-30 NOTE — Evaluation (Signed)
 Physical Therapy Evaluation Patient Details Name: Hailey Graham MRN: [id number] DOB: 1928/07/12 Today's Date: 06/30/2024  History of Present Illness  Hailey Graham is a 89 y.o. female with medical history significant of paroxysmal atrial fibrillation on Xarelto  and no other medical history who saw her cardiologist last Thursday for routine checkup, she was supposed to go for the lab check next day Friday but some reason order was not sent to the laboratory.  She went yesterday, blood was drawn, however CBC resulted today with hemoglobin under 7 so she was called by her cardiologist Dr. Okey to present to the ED.  Patient herself has no complaints at all.  She denies any nausea, vomiting, chest pain, shortness of breath, palpitation, any problem with urination or bowel bowel movements, no hematochezia, hematemesis or melena.   Clinical Impression  Patient demonstrates slow labored movement for rolling to side and sitting up at bedside due to weakness and abdominal discomfort at surgical site and pain in legs. Patient very unsteady on feet and limited to a few steps at bedside due to weakness and poor standing balance. Patient tolerated sitting up in chair after therapy with family member present. Patient will benefit from continued skilled physical therapy in hospital and recommended venue below to increase strength, balance, endurance for safe ADLs and gait.           If plan is discharge home, recommend the following: A lot of help with bathing/dressing/bathroom;A lot of help with walking and/or transfers;Help with stairs or ramp for entrance;Assistance with cooking/housework   Can travel by private vehicle   No    Equipment Recommendations None recommended by PT  Recommendations for Other Services       Functional Status Assessment Patient has had a recent decline in their functional status and demonstrates the ability to make significant improvements in function in a reasonable and  predictable amount of time.     Precautions / Restrictions Precautions Precautions: Fall Recall of Precautions/Restrictions: Intact Restrictions Weight Bearing Restrictions Per Provider Order: No      Mobility  Bed Mobility Overal bed mobility: Needs Assistance Bed Mobility: Rolling, Sidelying to Sit Rolling: Min assist, Mod assist Sidelying to sit: Mod assist       General bed mobility comments: increased time, labored movement    Transfers Overall transfer level: Needs assistance Equipment used: Rolling walker (2 wheels) Transfers: Sit to/from Stand, Bed to chair/wheelchair/BSC Sit to Stand: Mod assist   Step pivot transfers: Mod assist       General transfer comment: unsteady on feet, flexed trunks, buckling of knees due to weakness    Ambulation/Gait Ambulation/Gait assistance: Mod assist, Max assist Gait Distance (Feet): 8 Feet Assistive device: Rolling walker (2 wheels) Gait Pattern/deviations: Decreased step length - right, Decreased step length - left, Decreased stride length, Knees buckling, Trunk flexed Gait velocity: slow     General Gait Details: limited to a few side steps, steps forward/backwards before having to sit due to BLE weakness and c/o fatigue  Stairs            Wheelchair Mobility     Tilt Bed    Modified Rankin (Stroke Patients Only)       Balance Overall balance assessment: Needs assistance Sitting-balance support: Feet supported, No upper extremity supported Sitting balance-Leahy Scale: Fair Sitting balance - Comments: fair/good seated at EOB   Standing balance support: During functional activity, No upper extremity supported Standing balance-Leahy Scale: Poor Standing balance comment: fair/poor using RW  Pertinent Vitals/Pain Pain Assessment Pain Assessment: Faces Faces Pain Scale: Hurts even more Pain Location: BLE with pressure, RLE worse Pain Descriptors / Indicators:  Grimacing, Guarding, Sore, Discomfort Pain Intervention(s): Limited activity within patient's tolerance, Monitored during session, Repositioned    Home Living Family/patient expects to be discharged to:: Private residence Living Arrangements: Alone Available Help at Discharge: Family;Available PRN/intermittently Type of Home: House Home Access: Stairs to enter   Entrance Stairs-Number of Steps: 1   Home Layout: One level Home Equipment: Agricultural consultant (2 wheels);Cane - single point;Grab bars - tub/shower      Prior Function Prior Level of Function : Independent/Modified Independent             Mobility Comments: Community ambulation without AD ADLs Comments: Independent     Extremity/Trunk Assessment   Upper Extremity Assessment Upper Extremity Assessment: Generalized weakness    Lower Extremity Assessment Lower Extremity Assessment: Generalized weakness    Cervical / Trunk Assessment Cervical / Trunk Assessment: Kyphotic  Communication   Communication Communication: No apparent difficulties    Cognition Arousal: Alert Behavior During Therapy: WFL for tasks assessed/performed   PT - Cognitive impairments: No apparent impairments                         Following commands: Intact       Cueing Cueing Techniques: Verbal cues, Tactile cues     General Comments      Exercises     Assessment/Plan    PT Assessment Patient needs continued PT services  PT Problem List Decreased strength;Decreased activity tolerance;Decreased balance;Decreased mobility       PT Treatment Interventions DME instruction;Gait training;Stair training;Functional mobility training;Therapeutic activities;Therapeutic exercise;Balance training;Patient/family education    PT Goals (Current goals can be found in the Care Plan section)  Acute Rehab PT Goals Patient Stated Goal: return home after rehab PT Goal Formulation: With patient/family Time For Goal Achievement:  07/14/24 Potential to Achieve Goals: Good    Frequency Min 3X/week     Co-evaluation               AM-PAC PT 6 Clicks Mobility  Outcome Measure Help needed turning from your back to your side while in a flat bed without using bedrails?: A Lot Help needed moving from lying on your back to sitting on the side of a flat bed without using bedrails?: A Lot Help needed moving to and from a bed to a chair (including a wheelchair)?: A Lot Help needed standing up from a chair using your arms (e.g., wheelchair or bedside chair)?: A Lot Help needed to walk in hospital room?: A Lot Help needed climbing 3-5 steps with a railing? : Total 6 Click Score: 11    End of Session   Activity Tolerance: Patient tolerated treatment well;Patient limited by fatigue Patient left: in chair;with call bell/phone within reach Nurse Communication: Mobility status PT Visit Diagnosis: Unsteadiness on feet (R26.81);Other abnormalities of gait and mobility (R26.89);Muscle weakness (generalized) (M62.81)    Time: 9057-8997 PT Time Calculation (min) (ACUTE ONLY): 20 min   Charges:   PT Evaluation $PT Eval Moderate Complexity: 1 Mod PT Treatments $Therapeutic Activity: 8-22 mins PT General Charges $$ ACUTE PT VISIT: 1 Visit         1:55 PM, 06/30/24 Lynwood Music, MPT Physical Therapist with [facility] 336 [phone] office [phone] mobile phone

## 2024-06-30 NOTE — Plan of Care (Signed)
  Problem: Acute Rehab PT Goals(only PT should resolve) Goal: Pt Will Go Supine/Side To Sit Outcome: Progressing Flowsheets (Taken 06/30/2024 1357) Pt will go Supine/Side to Sit:  with minimal assist  with moderate assist Goal: Patient Will Transfer Sit To/From Stand Outcome: Progressing Flowsheets (Taken 06/30/2024 1357) Patient will transfer sit to/from stand:  with minimal assist  with moderate assist Goal: Pt Will Transfer Bed To Chair/Chair To Bed Outcome: Progressing Flowsheets (Taken 06/30/2024 1357) Pt will Transfer Bed to Chair/Chair to Bed:  with min assist  with mod assist Goal: Pt Will Ambulate Outcome: Progressing Flowsheets (Taken 06/30/2024 1357) Pt will Ambulate:  25 feet  with minimal assist  with moderate assist  with rolling walker   1:58 PM, 06/30/24 Lynwood Music, MPT Physical Therapist with [facility] 336 [phone] office [phone] mobile phone

## 2024-06-30 NOTE — Progress Notes (Signed)
 PROGRESS NOTE  Hailey Graham FMW:[id number] DOB: [birth year] DOA: 06/22/2024 PCP: Sheryle Carwin, MD  Brief History:  89 y.o. female with medical history significant of paroxysmal atrial fibrillation on Xarelto  and no other medical history who saw her cardiologist last Thursday for routine checkup, she was supposed to go for the lab check next day Friday but some reason order was not sent to the laboratory.  She went yesterday, blood was drawn, however CBC resulted today with hemoglobin under 7 so she was called by her cardiologist Dr. Okey to present to the ED.  Patient herself has no complaints at all.  She denies any nausea, vomiting, chest pain, shortness of breath, palpitation, any problem with urination or bowel bowel movements, no hematochezia, hematemesis or melena.   ED Course: Upon arrival to ED, patient fairly stable hemodynamically.  CBC showed hemoglobin of 5.6.  FOBT positive.  BMP not drawn.  2 units of PRBC transfusion ordered.  ED physician consulted GI, they recommended admission.  Hospitalist called for admission. Colonoscopy was done 06/25/24 and showed a cecal mass.  General surgery was consulted and patient underwent right hemicolectomy on 06/28/24 with liver biopsy.  Post-op, her diet was gradually advanced as her bowel function returned.   Assessment/Plan: Acute blood loss anemia secondary to lower GI bleed, POA: Hemoglobin 5.6.  2 units PRBC transfusion ordered by ED.   GI is consulted and colonoscopy with findings of a large cecal mass.  Holding Xarelto . --consulted to general surgery; pt and family decided to have the hemicolectomy after weighing the risks, benefits --appreciate GI and surgery consult --pt was offered right hemicolectomy and afted discussing with family and surgeon she would like to proceed with surgery --pt was taken to OR on 7/7 for right hemicolectomy and liver biopsy with Dr Kallie --Postop s/p right hemicolectomy and liver biopsy 06/28/24 - she is  recovering well postoperatively and surgery team advanced diet to full liquids>>soft diet on 06/30/24 --working on ambulating, getting up to chair and requested PT eval>>SNF --anticipating need for short term rehab; pt requesting [facility], TOC team updated    Paroxysmal atrial fibrillation: Currently in sinus rhythm.  -Resumed home Toprol -XL for rate control. --Pt says she takes 12.5 mg twice daily which we have resumed    Metabolic acidosis -- added sodium bicarbonate  tablets on 7/8   Anxiety:  -Resumed home dose of Xanax .   History of left breast cancer -- resumed home arimedex    Hypokalemia -- repleted    Leukocytosis -- reactive from surgery -- remains afebrile and hemodynamically stable       Family Communication:   daughter at bedside 7/10  Consultants:  GI, general surgery  Code Status:   DNR  DVT Prophylaxis:  SCDs   Procedures: As Listed in Progress Note Above  Antibiotics: None       Subjective: Abd pain is controlled.  She had 2 BMs since yesterday.  Denies n/v, cp, sob, f/c  Objective: Vitals:   06/29/24 1353 06/29/24 1950 06/30/24 0421 06/30/24 1226  BP: (!) 164/70 (!) 141/60 114/64 139/81  Pulse: 63 74 65 68  Resp: 20 19 17    Temp: (!) 97.4 F (36.3 C) 97.8 F (36.6 C) 97.9 F (36.6 C) (!) 97.5 F (36.4 C)  TempSrc: Oral Oral Oral Axillary  SpO2: 95% 94% 92% 96%  Weight:   70.9 kg   Height:        Intake/Output Summary (Last 24  hours) at 06/30/2024 1752 Last data filed at 06/30/2024 0900 Gross per 24 hour  Intake 240 ml  Output 550 ml  Net -310 ml   Weight change: -0.9 kg Exam:  General:  Pt is alert, follows commands appropriately, not in acute distress HEENT: No icterus, No thrush, No neck mass, [place]/AT Cardiovascular: RRR, S1/S2, no rubs, no gallops Respiratory: CTA bilaterally, no wheezing, no crackles, no rhonchi Abdomen: Soft/+BS, non tender, non distended, no guarding Extremities: No edema, No lymphangitis, No  petechiae, No rashes, no synovitis   Data Reviewed: I have personally reviewed following labs and imaging studies Basic Metabolic Panel: Recent Labs  Lab 06/24/24 0407 06/25/24 0435 06/26/24 0326 06/27/24 0343 06/28/24 0518 06/29/24 0422 06/30/24 0414  NA 140   < > 133* 133* 135 137 136  K 3.7   < > 4.0 3.8 4.0 4.4 3.9  CL 114*   < > 110 106 107 108 106  CO2 19*   < > 21* 18* 17* 18* 21*  GLUCOSE 95   < > 101* 84 80 141* 133*  BUN 12   < > 13 17 23  27* 30*  CREATININE 0.64   < > 0.75 0.64 0.70 0.81 0.65  CALCIUM 9.7   < > 9.4 9.3 9.4 9.9 9.7  MG 2.1  --   --   --   --   --   --    < > = values in this interval not displayed.   Liver Function Tests: No results for input(s): AST, ALT, ALKPHOS, BILITOT, PROT, ALBUMIN in the last 168 hours. No results for input(s): LIPASE, AMYLASE in the last 168 hours. No results for input(s): AMMONIA in the last 168 hours. Coagulation Profile: No results for input(s): INR, PROTIME in the last 168 hours. CBC: Recent Labs  Lab 06/26/24 0326 06/27/24 0343 06/28/24 0518 06/29/24 0422 06/30/24 0414  WBC 11.3* 9.9 10.1 14.2* 13.2*  HGB 9.7* 9.3* 9.7* 10.0* 9.7*  HCT 33.5* 32.5* 32.0* 34.8* 32.6*  MCV 76.8* 78.1* 77.1* 77.7* 77.3*  PLT 256 213 233 292 280   Cardiac Enzymes: No results for input(s): CKTOTAL, CKMB, CKMBINDEX, TROPONINI in the last 168 hours. BNP: Invalid input(s): POCBNP CBG: No results for input(s): GLUCAP in the last 168 hours. HbA1C: No results for input(s): HGBA1C in the last 72 hours. Urine analysis: No results found for: COLORURINE, APPEARANCEUR, LABSPEC, PHURINE, GLUCOSEU, HGBUR, BILIRUBINUR, KETONESUR, PROTEINUR, UROBILINOGEN, NITRITE, LEUKOCYTESUR Sepsis Labs: @LABRCNTIP (procalcitonin:4,lacticidven:4) ) Recent Results (from the past 240 hours)  Surgical PCR screen     Status: None   Collection Time: 06/27/24 10:09 AM   Specimen: Nasal Mucosa; Nasal  Swab  Result Value Ref Range Status   MRSA, PCR NEGATIVE NEGATIVE Final   Staphylococcus aureus NEGATIVE NEGATIVE Final    Comment: (NOTE) The Xpert SA Assay (FDA approved for NASAL specimens in patients 24 years of age and older), is one component of a comprehensive surveillance program. It is not intended to diagnose infection nor to guide or monitor treatment. Performed at [facility], [address], [place], [place] [address]      Scheduled Meds:  acetaminophen   1,000 mg Oral Q6H   anastrozole   1 mg Oral QPM   docusate sodium   100 mg Oral BID   dorzolamide -timolol   1 drop Both Eyes BID   ezetimibe   10 mg Oral Daily   And   simvastatin   20 mg Oral q1800   feeding supplement  237 mL Oral BID BM   metoprolol   succinate  12.5 mg Oral BID   mupirocin  ointment  1 Application Nasal BID   pantoprazole  (PROTONIX ) IV  40 mg Intravenous Q12H   pilocarpine   1 drop Right Eye QID   sodium bicarbonate   650 mg Oral TID   sodium chloride  flush  3 mL Intravenous Q12H   Continuous Infusions:  Procedures/Studies: DG Knee 1-2 Views Right Result Date: 06/25/2024 CLINICAL DATA:  Right knee pain, swelling EXAM: RIGHT KNEE - 1-2 VIEW COMPARISON:  03/30/2020 FINDINGS: Moderate tricompartment osteoarthritis with joint space narrowing and spurring, most pronounced in the lateral compartment. No joint effusion. No acute bony abnormality. Specifically, no fracture, subluxation, or dislocation. IMPRESSION: Moderate tricompartment osteoarthritis. No acute bony abnormality. Electronically Signed   By: Franky Crease M.D.   On: 06/25/2024 16:41   CT ABDOMEN PELVIS W CONTRAST Result Date: 06/25/2024 CLINICAL DATA:  Colorectal carcinoma. Mass on colonoscopy. Anemia. Staging exam. * Tracking Code: BO * EXAM: CT ABDOMEN AND PELVIS WITH CONTRAST TECHNIQUE: Multidetector CT imaging of the abdomen and pelvis was performed using the standard protocol following bolus administration of intravenous contrast. RADIATION DOSE  REDUCTION: This exam was performed according to the departmental dose-optimization program which includes automated exposure control, adjustment of the mA and/or kV according to patient size and/or use of iterative reconstruction technique. CONTRAST:  OMNIPAQUE  IOHEXOL  300 MG/ML  SOLN COMPARISON:  Contrast CT 01/25/2011 FINDINGS: Lower chest: Lung bases are clear. Hepatobiliary: Hypoenhancing mass in the lateral aspect of the LEFT hepatic lobe measures 3.9 x 3.1 cm. This lesion was not present on contrast CT from 01/25/2011. Small gallstones in the gallbladder. No biliary duct dilatation Pancreas: Pancreas is normal. No ductal dilatation. No pancreatic inflammation. Spleen: Normal spleen Adrenals/urinary tract: Adrenal glands and kidneys are normal. The ureters and bladder normal. Stomach/Bowel: Stomach, duodenum and small bowel are normal. Circumferential thickening in the cecum (image 46/2). No discrete measurable mass. Appendix not identified. Ascending, transverse descending colon. Multiple diverticula through the sigmoid colon. No obstructing mass lesion identified. No lymphadenopathy within the sigmoid mesocolon. There is enlarged lymph node in ileocecal mesentery just ventral to the RIGHT psoas muscle measuring 19 mm x 10 mm on image 46/2. Vascular/Lymphatic: Abdominal aorta is normal caliber. No periportal or retroperitoneal adenopathy. No pelvic adenopathy. Reproductive: Post hysterectomy.  Adnexa unremarkable Other: No free fluid. Musculoskeletal: No aggressive osseous lesion. IMPRESSION: 1. Circumferential thickening in the cecum concerning for primary colon adenocarcinoma given the endoscopy findings. 2. Enlarged lymph node in the ileocecal mesentery concerning for local nodal metastasis. 3. Hypoenhancing mass in the LEFT hepatic lobe concerning for hepatic metastasis. 4. Cholelithiasis without evidence cholecystitis. 5. Sigmoid diverticulosis without evidence diverticulitis. Electronically Signed    By: Jackquline Boxer M.D.   On: 06/25/2024 15:46   DG Chest Port 1 View Result Date: 06/25/2024 CLINICAL DATA:  Colon mass.  History of breast cancer. EXAM: PORTABLE CHEST 1 VIEW COMPARISON:  10/01/2021. FINDINGS: The heart size and mediastinal contours are within normal limits. There is atherosclerotic calcification aorta. Mild atelectasis is noted at the lung bases bilaterally. No effusion or pneumothorax is seen. Surgical clips are noted in the left breast. No acute osseous abnormality. IMPRESSION: Mild atelectasis at the lung bases. Electronically Signed   By: Leita Birmingham M.D.   On: 06/25/2024 15:40    Alm Schneider, DO  [facility]  If 7PM-7AM, please contact night-coverage [email or web address] Password [email or web address] 06/30/2024, 5:52 PM   LOS: 4 days

## 2024-06-30 NOTE — TOC Progression Note (Signed)
 Transition of Care [facility]) - Progression Note    Patient Details  Name: Hailey Graham MRN: [id number] Date of Birth: [birth year]  [name] CM/SW Contact  Lucie Lunger, [place] Phone Number: 06/30/2024, 3:30 PM  Clinical Narrative:    CSW updated that PT is recommending SNF placement. CSW completed SNF referral and sent out to [facility] as requested by pt. CSW spoke to pt to update that [facility] offered bed, pt accepts, HUB updated to reflect pts bed choice. Insurance auth started and approved for SNF placement. TOC to follow.   Expected Discharge Plan: Skilled Nursing Facility Barriers to Discharge: Continued Medical Work up  Expected Discharge Plan and Services In-house Referral: Clinical Social Work Discharge Planning Services: CM Consult Post Acute Care Choice: Skilled Nursing Facility Living arrangements for the past 2 months: Single Family Home                                       Social Determinants of Health (SDOH) Interventions SDOH Screenings   Food Insecurity: No Food Insecurity (06/22/2024)  Housing: Low Risk  (06/22/2024)  Transportation Needs: No Transportation Needs (06/22/2024)  Utilities: Not At Risk (06/22/2024)  Depression (PHQ2-9): Low Risk  (02/21/2020)  Social Connections: Socially Isolated (06/22/2024)  Tobacco Use: Low Risk  (06/28/2024)    Readmission Risk Interventions     No data to display

## 2024-06-30 NOTE — [place] FL2 (Signed)
   MEDICAID FL2 LEVEL OF CARE FORM     IDENTIFICATION  Patient Name: Hailey Graham Birthdate: 17-Jun-1928 Sex: female Admission Date (Current Location): 06/22/2024  [facility] and [place] Number:  [employer] and Address:  [facility],  [address] [place] [address]      Provider Number: [id number]  Attending Physician Name and Address:  Evonnie Lenis, MD  Relative Name and Phone Number:       Current Level of Care: Hospital Recommended Level of Care: Skilled Nursing Facility Prior Approval Number:    Date Approved/Denied:   PASRR Number: [id number]  Discharge Plan: SNF    Current Diagnoses: Patient Active Problem List   Diagnosis Date Noted   Liver mass, left lobe 06/28/2024   Lower GI bleed 06/26/2024   Cecal cancer (HCC) 06/25/2024   Iron deficiency anemia due to chronic blood loss 06/23/2024   Heme positive stool 06/23/2024   Upper GI bleed 06/22/2024   Abdominal pain, epigastric 08/01/2020   Menopause 11/02/2019   Situational anxiety 11/02/2019   Breast cancer of upper-outer quadrant of left female breast (HCC) 11/24/2015   Hx of diagnostic tests 03/22/2013   Chronic anticoagulation 02/19/2013   Paroxysmal atrial fibrillation (HCC)    Gastroesophageal reflux disease    Hyperlipidemia    Varicose veins of bilateral lower extremities with other complications 02/18/2012    Orientation RESPIRATION BLADDER Height & Weight     Self, Time, Situation, Place  O2 (1L) Incontinent Weight: 156 lb 4.9 oz (70.9 kg) Height:  5' 6 (167.6 cm)  BEHAVIORAL SYMPTOMS/MOOD NEUROLOGICAL BOWEL NUTRITION STATUS      Continent Diet (See D/C summary)  AMBULATORY STATUS COMMUNICATION OF NEEDS Skin   Extensive Assist Verbally Normal                       Personal Care Assistance Level of Assistance  Bathing, Feeding, Dressing Bathing Assistance: Limited assistance Feeding assistance: Independent Dressing Assistance: Limited assistance      Functional Limitations Info  Sight, Hearing, Speech Sight Info: Impaired Hearing Info: Adequate Speech Info: Adequate    SPECIAL CARE FACTORS FREQUENCY  PT (By licensed PT), OT (By licensed OT)     PT Frequency: 5 times weekly OT Frequency: 5 times weekly            Contractures Contractures Info: Not present    Additional Factors Info  Code Status, Allergies Code Status Info: DNR-Limited Allergies Info: Brimonidine, Sulfa Drugs Cross Reactors, Codeine, Morphine, Sulfamethoxazole           Current Medications (06/30/2024):  This is the current hospital active medication list Current Facility-Administered Medications  Medication Dose Route Frequency Provider Last Rate Last Admin   acetaminophen  (TYLENOL ) tablet 1,000 mg  1,000 mg Oral Q6H Kallie Manuelita BROCKS, MD   1,000 mg at 06/29/24 2331   ALPRAZolam  (XANAX ) tablet 0.25 mg  0.25 mg Oral Daily PRN Kallie Manuelita BROCKS, MD   0.25 mg at 06/29/24 2112   anastrozole  (ARIMIDEX ) tablet 1 mg  1 mg Oral QPM Kallie Manuelita BROCKS, MD   1 mg at 06/29/24 1824   diclofenac  Sodium (VOLTAREN ) 1 % topical gel 2 g  2 g Topical QID PRN Kallie Manuelita BROCKS, MD   2 g at 06/25/24 2139   docusate sodium  (COLACE) capsule 100 mg  100 mg Oral BID Kallie Manuelita BROCKS, MD       dorzolamide -timolol  (COSOPT ) 2-0.5 % ophthalmic solution 1 drop  1 drop  Both Eyes BID Kallie Manuelita BROCKS, MD   1 drop at 06/30/24 9094   ezetimibe  (ZETIA ) tablet 10 mg  10 mg Oral Daily Kallie Manuelita BROCKS, MD   10 mg at 06/30/24 9095   And   simvastatin  (ZOCOR ) tablet 20 mg  20 mg Oral q1800 Kallie Manuelita BROCKS, MD   20 mg at 06/29/24 1823   feeding supplement (ENSURE SURGERY) liquid 237 mL  237 mL Oral BID BM Kallie Manuelita BROCKS, MD   237 mL at 06/30/24 9094   HYDROmorphone  (DILAUDID ) injection 0.5 mg  0.5 mg Intravenous Q4H PRN Kallie Manuelita BROCKS, MD       metoprolol  succinate (TOPROL -XL) 24 hr tablet 12.5 mg  12.5 mg Oral BID Kallie Manuelita BROCKS, MD   12.5 mg at 06/30/24 9095    mupirocin  ointment (BACTROBAN ) 2 % 1 Application  1 Application Nasal BID Kallie Manuelita BROCKS, MD   1 Application at 06/30/24 9093   ondansetron  (ZOFRAN ) tablet 4 mg  4 mg Oral Q6H PRN Kallie Manuelita BROCKS, MD   4 mg at 06/27/24 1349   Or   ondansetron  (ZOFRAN ) injection 4 mg  4 mg Intravenous Q6H PRN Kallie Manuelita BROCKS, MD   4 mg at 06/29/24 2116   Oral care mouth rinse  15 mL Mouth Rinse PRN Kallie Manuelita BROCKS, MD       oxyCODONE  (Oxy IR/ROXICODONE ) immediate release tablet 5 mg  5 mg Oral Q6H PRN Kallie Manuelita BROCKS, MD   5 mg at 06/29/24 2113   pantoprazole  (PROTONIX ) injection 40 mg  40 mg Intravenous Q12H Kallie Manuelita BROCKS, MD   40 mg at 06/30/24 0904   pilocarpine  (PILOCAR) 1 % ophthalmic solution 1 drop  1 drop Right Eye QID Kallie Manuelita BROCKS, MD   1 drop at 06/30/24 0905   prochlorperazine  (COMPAZINE ) injection 10 mg  10 mg Intravenous Q4H PRN Johnson, Clanford L, MD   10 mg at 06/29/24 2329   sodium bicarbonate  tablet 650 mg  650 mg Oral TID Vicci Pen L, MD   650 mg at 06/30/24 9095   sodium chloride  flush (NS) 0.9 % injection 3 mL  3 mL Intravenous Q12H Kallie Manuelita BROCKS, MD   3 mL at 06/30/24 9092   traMADol  (ULTRAM ) tablet 50 mg  50 mg Oral Q6H PRN Kallie Manuelita BROCKS, MD   50 mg at 06/29/24 1240   Facility-Administered Medications Ordered in Other Encounters  Medication Dose Route Frequency Provider Last Rate Last Admin   fentaNYL  (SUBLIMAZE ) injection   Intravenous Anesthesia Intra-op Herschell Hollering, MD   25 mcg at 06/25/24 0950   lactated ringers  infusion   Intravenous Continuous PRN Herschell Hollering, MD 1,000 mL/hr at 06/25/24 0947 Rate Change at 06/25/24 0947   midazolam  (VERSED ) injection   Intravenous Anesthesia Intra-op Herschell Hollering, MD   1 mg at 06/25/24 1002   propofol  (DIPRIVAN ) 10 mg/mL bolus/IV push   Intravenous Anesthesia Intra-op Herschell Hollering, MD   20 mg at 06/25/24 0930     Discharge Medications: Please see discharge summary for a list of discharge  medications.  Relevant Imaging Results:  Relevant Lab Results:   Additional Information SSN: [name] [place]

## 2024-06-30 NOTE — Discharge Instructions (Signed)
 Discharge Open Abdominal Surgery Instructions:  Bandage can be removed at time of discharge if not already removed. Ok to shower. But do not submerge in water.   Common Complaints: Pain at the incision site is common. This will improve with time. Take your pain medications as described below. Some nausea is common and poor appetite. The main goal is to stay hydrated the first few days after surgery.   Diet/ Activity: Diet as tolerated. You have started and tolerated a diet in the hospital, and should continue to increase what you are able to eat.   You may not have a large appetite, but it is important to stay hydrated. Drink 64 ounces of water a day. Your appetite will return with time.  Shower per your regular routine daily.   Pat the incision dry. Wear an abdominal binder daily with activity. You do not have to wear this while sleeping or sitting.  Rest and listen to your body, but do not remain in bed all day.  Walk everyday for at least 15-20 minutes. Deep cough and move around every 1-2 hours in the first few days after surgery.  Do not lift > 10 lbs, perform excessive bending, pushing, pulling, squatting for 6-8 weeks after surgery.  The activity restrictions and the abdominal binder are to prevent hernia formation at your incision while you are healing.  Do not place lotions or balms on your incision unless instructed to specifically by Dr. Kallie.   Pain Expectations and Narcotics: -After surgery you will have pain associated with your incisions and this is normal. The pain is muscular and nerve pain, and will get better with time. -You are encouraged and expected to take non narcotic medications like tylenol  and ibuprofen (when able) to treat pain as multiple modalities can aid with pain treatment. -Narcotics are only used when pain is severe or there is breakthrough pain. -You are not expected to have a pain score of 0 after surgery, as we cannot prevent pain. A pain score of 3-4  that allows you to be functional, move, walk, and tolerate some activity is the goal. The pain will continue to improve over the days after surgery and is dependent on your surgery. -Due to [place]  law, we are only able to give a certain amount of pain medication to treat post operative pain, and we only give additional narcotics on a patient by patient basis.  -For most laparoscopic surgery, studies have shown that the majority of patients only need 10-15 narcotic pills, and for open surgeries most patients only need 15-20.   -Having appropriate expectations of pain and knowledge of pain management with non narcotics is important as we do not want anyone to become addicted to narcotic pain medication.  -Using ice packs in the first 48 hours and heating pads after 48 hours, wearing an abdominal binder (when recommended), and using over the counter medications are all ways to help with pain management.   -Simple acts like meditation and mindfulness practices after surgery can also help with pain control and research has proven the benefit of these practices.  Medication: Take tylenol  and ibuprofen as needed for pain control, alternating every 4-6 hours.  Example:  Tylenol  1000mg  @ 6am, 12noon, 6pm, (Do not exceed 4000mg  of tylenol  a day). Ibuprofen 800mg  @ 9am, 3pm, 9pm, 3am (Do not exceed 3600mg  of ibuprofen a day).  Take narcotic for breakthrough pain if prescribed.  Take Colace for constipation related to narcotic pain medication. If you do  not have a bowel movement in 2 days, take Miralax  over the counter.  Drink plenty of water to also prevent constipation.   Contact Information: If you have questions or concerns, please call our office, [phone], Monday- Thursday 8AM-5PM and Friday 8AM-12Noon.  If it is after hours or on the weekend, please call [facility] Main Number, [phone], [phone], and ask to speak to the surgeon on call for Dr. Kallie at [facility].

## 2024-07-01 ENCOUNTER — Ambulatory Visit: Payer: Self-pay | Admitting: Internal Medicine

## 2024-07-01 DIAGNOSIS — C180 Malignant neoplasm of cecum: Secondary | ICD-10-CM | POA: Diagnosis not present

## 2024-07-01 DIAGNOSIS — D500 Iron deficiency anemia secondary to blood loss (chronic): Secondary | ICD-10-CM | POA: Diagnosis not present

## 2024-07-01 DIAGNOSIS — K922 Gastrointestinal hemorrhage, unspecified: Secondary | ICD-10-CM | POA: Diagnosis not present

## 2024-07-01 LAB — BASIC METABOLIC PANEL WITH GFR
Anion gap: 6 (ref 5–15)
BUN: 30 mg/dL — ABNORMAL HIGH (ref 8–23)
CO2: 25 mmol/L (ref 22–32)
Calcium: 9.3 mg/dL (ref 8.9–10.3)
Chloride: 108 mmol/L (ref 98–111)
Creatinine, Ser: 0.66 mg/dL (ref 0.44–1.00)
GFR, Estimated: >60 mL/min (ref >60)
Glucose, Bld: 88 mg/dL (ref 70–99)
Potassium: 3.6 mmol/L (ref 3.5–5.1)
Sodium: 139 mmol/L (ref 135–145)

## 2024-07-01 LAB — CBC
HCT: 32.3 % — ABNORMAL LOW (ref 36.0–46.0)
Hemoglobin: 9.5 g/dL — ABNORMAL LOW (ref 12.0–15.0)
MCH: 22.6 pg — ABNORMAL LOW (ref 26.0–34.0)
MCHC: 29.4 g/dL — ABNORMAL LOW (ref 30.0–36.0)
MCV: 76.9 fL — ABNORMAL LOW (ref 80.0–100.0)
Platelets: 274 K/uL (ref 150–400)
RBC: 4.2 MIL/uL (ref 3.87–5.11)
RDW: 23.5 % — ABNORMAL HIGH (ref 11.5–15.5)
WBC: 10.8 K/uL — ABNORMAL HIGH (ref 4.0–10.5)
nRBC: 0 % (ref 0.0–0.2)

## 2024-07-01 LAB — MAGNESIUM: Magnesium: 1.9 mg/dL (ref 1.7–2.4)

## 2024-07-01 LAB — PHOSPHORUS: Phosphorus: 1.6 mg/dL — ABNORMAL LOW (ref 2.5–4.6)

## 2024-07-01 MED ORDER — POTASSIUM PHOSPHATES 15 MMOLE/5ML IV SOLN
30.0000 mmol | Freq: Once | INTRAVENOUS | Status: DC
  Administered 2024-07-01: 30 mmol via INTRAVENOUS
  Filled 2024-07-01: qty 10

## 2024-07-01 MED ORDER — PANTOPRAZOLE SODIUM 40 MG PO TBEC
40.0000 mg | DELAYED_RELEASE_TABLET | Freq: Two times a day (BID) | ORAL | Status: DC
  Administered 2024-07-01 – 2024-07-02 (×2): 40 mg via ORAL
  Filled 2024-07-01 (×2): qty 1

## 2024-07-01 MED ORDER — K PHOS MONO-SOD PHOS DI & MONO 155-852-130 MG PO TABS
500.0000 mg | ORAL_TABLET | Freq: Three times a day (TID) | ORAL | Status: DC
  Administered 2024-07-01 – 2024-07-02 (×2): 500 mg via ORAL
  Filled 2024-07-01 (×3): qty 2

## 2024-07-01 NOTE — Progress Notes (Signed)
 3 Days Post-Op  Subjective: Patient has no complaints.  She states she is eating a little bit better.  She had a bowel movement overnight.  Objective: Vital signs in last 24 hours: Temp:  [98 F (36.7 C)-98.2 F (36.8 C)] 98.2 F (36.8 C) (07/10 1249) Pulse Rate:  [68] 68 (07/10 1249) Resp:  [18] 18 (07/10 1249) BP: (104-156)/(61-72) 156/72 (07/10 1249) SpO2:  [92 %-94 %] 94 % (07/10 1249) Last BM Date : 06/27/24  Intake/Output from previous day: 07/09 0701 - 07/10 0700 In: 240 [P.O.:240] Out: 400 [Urine:400] Intake/Output this shift: Total I/O In: 240 [P.O.:240] Out: -   General appearance: alert, cooperative, and no distress GI: soft, non-tender; bowel sounds normal; no masses,  no organomegaly and incision healing well.  Lab Results:  Recent Labs    06/30/24 0414 07/01/24 0410  WBC 13.2* 10.8*  HGB 9.7* 9.5*  HCT 32.6* 32.3*  PLT 280 274   BMET Recent Labs    06/30/24 0414 07/01/24 0410  NA 136 139  K 3.9 3.6  CL 106 108  CO2 21* 25  GLUCOSE 133* 88  BUN 30* 30*  CREATININE 0.65 0.66  CALCIUM 9.7 9.3   PT/INR No results for input(s): LABPROT, INR in the last 72 hours.  Studies/Results: No results found.  Anti-infectives: Anti-infectives (From admission, onward)    Start     Dose/Rate Route Frequency Ordered Stop   06/29/24 0000  cefoTEtan  (CEFOTAN ) 2 g in sodium chloride  0.9 % 100 mL IVPB        2 g 200 mL/hr over 30 Minutes Intravenous Every 12 hours 06/28/24 1541 06/29/24 1117   06/28/24 0700  cefoTEtan  (CEFOTAN ) 2 g in sodium chloride  0.9 % 100 mL IVPB        2 g 200 mL/hr over 30 Minutes Intravenous On call to O.R. 06/27/24 1038 06/28/24 1625   06/27/24 2200  neomycin  (MYCIFRADIN ) tablet 1,000 mg       Placed in Followed by Linked Group   1,000 mg Oral  Once 06/27/24 0930 06/27/24 2050   06/27/24 2200  metroNIDAZOLE  (FLAGYL ) tablet 1,000 mg       Placed in Followed by Linked Group   1,000 mg Oral  Once 06/27/24 0930 06/27/24 2049    06/27/24 1500  neomycin  (MYCIFRADIN ) tablet 1,000 mg       Placed in Followed by Linked Group   1,000 mg Oral  Once 06/27/24 0930 06/27/24 1543   06/27/24 1500  metroNIDAZOLE  (FLAGYL ) tablet 1,000 mg       Placed in Followed by Linked Group   1,000 mg Oral  Once 06/27/24 0930 06/27/24 1543   06/27/24 1400  neomycin  (MYCIFRADIN ) tablet 1,000 mg       Placed in Followed by Linked Group   1,000 mg Oral  Once 06/27/24 0930 06/27/24 1344   06/27/24 1400  metroNIDAZOLE  (FLAGYL ) tablet 1,000 mg       Placed in Followed by Linked Group   1,000 mg Oral  Once 06/27/24 0930 06/27/24 1345   06/27/24 1130  cefoTEtan  (CEFOTAN ) 2 g in sodium chloride  0.9 % 100 mL IVPB  Status:  Discontinued        2 g 200 mL/hr over 30 Minutes Intravenous On call to O.R. 06/27/24 1037 06/27/24 1038       Assessment/Plan: s/p Procedure(s): RIGHT HEMI-COLECTOMY WITH  LIVER BIOPSY Impression: Stable postoperatively.  Awaiting placement.  Pathology still pending.  Hypophosphatemia already noted and is being addressed.  LOS: 5  days    Oneil Budge 07/01/2024

## 2024-07-01 NOTE — Progress Notes (Addendum)
 PROGRESS NOTE  Hailey Graham FMW:[id number] DOB: 1928/01/20 DOA: 06/22/2024 PCP: Sheryle Carwin, MD  Brief History:  89 y.o. female with medical history significant of paroxysmal atrial fibrillation on Xarelto  and no other medical history who saw her cardiologist last Thursday for routine checkup, she was supposed to go for the lab check next day Friday but some reason order was not sent to the laboratory.  She went yesterday, blood was drawn, however CBC resulted today with hemoglobin under 7 so she was called by her cardiologist Dr. Okey to present to the ED.  Patient herself has no complaints at all.  She denies any nausea, vomiting, chest pain, shortness of breath, palpitation, any problem with urination or bowel bowel movements, no hematochezia, hematemesis or melena.   ED Course: Upon arrival to ED, patient fairly stable hemodynamically.  CBC showed hemoglobin of 5.6.  FOBT positive.  BMP not drawn.  2 units of PRBC transfusion ordered.  ED physician consulted GI, they recommended admission.  Hospitalist called for admission. Colonoscopy was done 06/25/24 and showed a cecal mass.  General surgery was consulted and patient underwent right hemicolectomy on 06/28/24 with liver biopsy.  Post-op, her diet was gradually advanced as her bowel function returned.   Assessment/Plan: Acute blood loss anemia secondary to lower GI bleed, --POA: Hemoglobin 5.6.  2 units PRBC transfusion ordered by ED.   --GI is consulted and colonoscopy with findings of a large cecal mass.  Holding Xarelto . --consulted to general surgery; pt and family decided to have the hemicolectomy after weighing the risks, benefits --appreciate GI and surgery consult --pt was offered right hemicolectomy and afted discussing with family and surgeon she would like to proceed with surgery --pt was taken to OR on 7/7 for right hemicolectomy and liver biopsy with Dr Kallie --Postop s/p right hemicolectomy and liver biopsy 06/28/24 - she  is recovering well postoperatively and surgery team advanced diet to full liquids>>soft diet on 06/30/24 --working on ambulating, getting up to chair and requested PT eval>>SNF --anticipating need for short term rehab; pt requesting [facility], TOC team updated  --06/25/24 colonoscopy path = adenocarcinoma --06/28/24 surgical path = pending --appointment at [facility] 07/15/24 at 11AM   Paroxysmal atrial fibrillation: Currently in sinus rhythm.  -Resumed home Toprol -XL for rate control. --Pt says she takes 12.5 mg twice daily which we have resumed    Metabolic acidosis -- added sodium bicarbonate  tablets on 7/8>>stop on 7/10   Anxiety:  -Resumed home dose of Xanax .   History of left breast cancer  -- Left lumpectomy 12/13/2015: Invasive ductal carcinoma 0.4 cm, DCIS 1.6 cm, clear margins, ER 100%, PR 100%, HER-2 negative  -- could not tolerate tamoxifen  -- resumed home arimedex    Hypokalemia/Hypophosphatemia -- repleting   Leukocytosis -- reactive from surgery -- remains afebrile and hemodynamically stable             Family Communication:   daughter at bedside 7/10   Consultants:  GI, general surgery   Code Status:   DNR   DVT Prophylaxis:  SCDs     Procedures: As Listed in Progress Note Above   Antibiotics: None            Subjective: Pt had small BM this morning.  Denies f/c, cp, sob, n/v/d.  Abd pain is controlled  Objective: Vitals:   06/30/24 0421 06/30/24 1226 06/30/24 2023 07/01/24 1249  BP: 114/64 139/81 104/61 (!) 156/72  Pulse:  65 68 68 68  Resp: 17  18 18   Temp: 97.9 F (36.6 C) (!) 97.5 F (36.4 C) 98 F (36.7 C) 98.2 F (36.8 C)  TempSrc: Oral Axillary Oral Oral  SpO2: 92% 96% 92% 94%  Weight: 70.9 kg     Height:        Intake/Output Summary (Last 24 hours) at 07/01/2024 1716 Last data filed at 07/01/2024 0856 Gross per 24 hour  Intake 240 ml  Output 400 ml  Net -160 ml   Weight change:  Exam:  General:  Pt is alert,  follows commands appropriately, not in acute distress HEENT: No icterus, No thrush, No neck mass, [place]/AT Cardiovascular: RRR, S1/S2, no rubs, no gallops Respiratory: fine basilar rales.  No wheeze Abdomen: Soft/+BS, mild incisional tender, non distended, no guarding Extremities: No edema, No lymphangitis, No petechiae, No rashes, no synovitis   Data Reviewed: I have personally reviewed following labs and imaging studies Basic Metabolic Panel: Recent Labs  Lab 06/27/24 0343 06/28/24 0518 06/29/24 0422 06/30/24 0414 07/01/24 0410  NA 133* 135 137 136 139  K 3.8 4.0 4.4 3.9 3.6  CL 106 107 108 106 108  CO2 18* 17* 18* 21* 25  GLUCOSE 84 80 141* 133* 88  BUN 17 23 27* 30* 30*  CREATININE 0.64 0.70 0.81 0.65 0.66  CALCIUM 9.3 9.4 9.9 9.7 9.3  MG  --   --   --   --  1.9  PHOS  --   --   --   --  1.6*   Liver Function Tests: No results for input(s): AST, ALT, ALKPHOS, BILITOT, PROT, ALBUMIN in the last 168 hours. No results for input(s): LIPASE, AMYLASE in the last 168 hours. No results for input(s): AMMONIA in the last 168 hours. Coagulation Profile: No results for input(s): INR, PROTIME in the last 168 hours. CBC: Recent Labs  Lab 06/27/24 0343 06/28/24 0518 06/29/24 0422 06/30/24 0414 07/01/24 0410  WBC 9.9 10.1 14.2* 13.2* 10.8*  HGB 9.3* 9.7* 10.0* 9.7* 9.5*  HCT 32.5* 32.0* 34.8* 32.6* 32.3*  MCV 78.1* 77.1* 77.7* 77.3* 76.9*  PLT 213 233 292 280 274   Cardiac Enzymes: No results for input(s): CKTOTAL, CKMB, CKMBINDEX, TROPONINI in the last 168 hours. BNP: Invalid input(s): POCBNP CBG: No results for input(s): GLUCAP in the last 168 hours. HbA1C: No results for input(s): HGBA1C in the last 72 hours. Urine analysis: No results found for: COLORURINE, APPEARANCEUR, LABSPEC, PHURINE, GLUCOSEU, HGBUR, BILIRUBINUR, KETONESUR, PROTEINUR, UROBILINOGEN, NITRITE, LEUKOCYTESUR Sepsis  Labs: @LABRCNTIP (procalcitonin:4,lacticidven:4) ) Recent Results (from the past 240 hours)  Surgical PCR screen     Status: None   Collection Time: 06/27/24 10:09 AM   Specimen: Nasal Mucosa; Nasal Swab  Result Value Ref Range Status   MRSA, PCR NEGATIVE NEGATIVE Final   Staphylococcus aureus NEGATIVE NEGATIVE Final    Comment: (NOTE) The Xpert SA Assay (FDA approved for NASAL specimens in patients 66 years of age and older), is one component of a comprehensive surveillance program. It is not intended to diagnose infection nor to guide or monitor treatment. Performed at [facility], [address], [place], [place] [address]      Scheduled Meds:  acetaminophen   1,000 mg Oral Q6H   anastrozole   1 mg Oral QPM   docusate sodium   100 mg Oral BID   dorzolamide -timolol   1 drop Both Eyes BID   ezetimibe   10 mg Oral Daily   And   simvastatin   20 mg Oral q1800  feeding supplement  237 mL Oral BID BM   metoprolol  succinate  12.5 mg Oral BID   mupirocin  ointment  1 Application Nasal BID   pantoprazole   40 mg Oral BID AC   phosphorus  500 mg Oral TID   pilocarpine   1 drop Right Eye QID   sodium bicarbonate   650 mg Oral TID   sodium chloride  flush  3 mL Intravenous Q12H   Continuous Infusions:  Procedures/Studies: DG Knee 1-2 Views Right Result Date: 06/25/2024 CLINICAL DATA:  Right knee pain, swelling EXAM: RIGHT KNEE - 1-2 VIEW COMPARISON:  03/30/2020 FINDINGS: Moderate tricompartment osteoarthritis with joint space narrowing and spurring, most pronounced in the lateral compartment. No joint effusion. No acute bony abnormality. Specifically, no fracture, subluxation, or dislocation. IMPRESSION: Moderate tricompartment osteoarthritis. No acute bony abnormality. Electronically Signed   By: Franky Crease M.D.   On: 06/25/2024 16:41   CT ABDOMEN PELVIS W CONTRAST Result Date: 06/25/2024 CLINICAL DATA:  Colorectal carcinoma. Mass on colonoscopy. Anemia. Staging exam. * Tracking Code: BO *  EXAM: CT ABDOMEN AND PELVIS WITH CONTRAST TECHNIQUE: Multidetector CT imaging of the abdomen and pelvis was performed using the standard protocol following bolus administration of intravenous contrast. RADIATION DOSE REDUCTION: This exam was performed according to the departmental dose-optimization program which includes automated exposure control, adjustment of the mA and/or kV according to patient size and/or use of iterative reconstruction technique. CONTRAST:  OMNIPAQUE  IOHEXOL  300 MG/ML  SOLN COMPARISON:  Contrast CT 01/25/2011 FINDINGS: Lower chest: Lung bases are clear. Hepatobiliary: Hypoenhancing mass in the lateral aspect of the LEFT hepatic lobe measures 3.9 x 3.1 cm. This lesion was not present on contrast CT from 01/25/2011. Small gallstones in the gallbladder. No biliary duct dilatation Pancreas: Pancreas is normal. No ductal dilatation. No pancreatic inflammation. Spleen: Normal spleen Adrenals/urinary tract: Adrenal glands and kidneys are normal. The ureters and bladder normal. Stomach/Bowel: Stomach, duodenum and small bowel are normal. Circumferential thickening in the cecum (image 46/2). No discrete measurable mass. Appendix not identified. Ascending, transverse descending colon. Multiple diverticula through the sigmoid colon. No obstructing mass lesion identified. No lymphadenopathy within the sigmoid mesocolon. There is enlarged lymph node in ileocecal mesentery just ventral to the RIGHT psoas muscle measuring 19 mm x 10 mm on image 46/2. Vascular/Lymphatic: Abdominal aorta is normal caliber. No periportal or retroperitoneal adenopathy. No pelvic adenopathy. Reproductive: Post hysterectomy.  Adnexa unremarkable Other: No free fluid. Musculoskeletal: No aggressive osseous lesion. IMPRESSION: 1. Circumferential thickening in the cecum concerning for primary colon adenocarcinoma given the endoscopy findings. 2. Enlarged lymph node in the ileocecal mesentery concerning for local nodal  metastasis. 3. Hypoenhancing mass in the LEFT hepatic lobe concerning for hepatic metastasis. 4. Cholelithiasis without evidence cholecystitis. 5. Sigmoid diverticulosis without evidence diverticulitis. Electronically Signed   By: Jackquline Boxer M.D.   On: 06/25/2024 15:46   DG Chest Port 1 View Result Date: 06/25/2024 CLINICAL DATA:  Colon mass.  History of breast cancer. EXAM: PORTABLE CHEST 1 VIEW COMPARISON:  10/01/2021. FINDINGS: The heart size and mediastinal contours are within normal limits. There is atherosclerotic calcification aorta. Mild atelectasis is noted at the lung bases bilaterally. No effusion or pneumothorax is seen. Surgical clips are noted in the left breast. No acute osseous abnormality. IMPRESSION: Mild atelectasis at the lung bases. Electronically Signed   By: Leita Birmingham M.D.   On: 06/25/2024 15:40    Alm Schneider, DO  [facility]  If 7PM-7AM, please contact night-coverage [email or web address] Password [facility] 07/01/2024, 5:16 PM  LOS: 5 days

## 2024-07-01 NOTE — Plan of Care (Signed)

## 2024-07-02 DIAGNOSIS — C189 Malignant neoplasm of colon, unspecified: Secondary | ICD-10-CM | POA: Diagnosis not present

## 2024-07-02 DIAGNOSIS — Z853 Personal history of malignant neoplasm of breast: Secondary | ICD-10-CM | POA: Diagnosis not present

## 2024-07-02 DIAGNOSIS — D519 Vitamin B12 deficiency anemia, unspecified: Secondary | ICD-10-CM | POA: Diagnosis not present

## 2024-07-02 DIAGNOSIS — Z7189 Other specified counseling: Secondary | ICD-10-CM | POA: Diagnosis not present

## 2024-07-02 DIAGNOSIS — C50412 Malignant neoplasm of upper-outer quadrant of left female breast: Secondary | ICD-10-CM | POA: Diagnosis not present

## 2024-07-02 DIAGNOSIS — K219 Gastro-esophageal reflux disease without esophagitis: Secondary | ICD-10-CM | POA: Diagnosis not present

## 2024-07-02 DIAGNOSIS — H409 Unspecified glaucoma: Secondary | ICD-10-CM | POA: Diagnosis not present

## 2024-07-02 DIAGNOSIS — Z48815 Encounter for surgical aftercare following surgery on the digestive system: Secondary | ICD-10-CM | POA: Diagnosis not present

## 2024-07-02 DIAGNOSIS — C787 Secondary malignant neoplasm of liver and intrahepatic bile duct: Secondary | ICD-10-CM | POA: Diagnosis present

## 2024-07-02 DIAGNOSIS — K5791 Diverticulosis of intestine, part unspecified, without perforation or abscess with bleeding: Secondary | ICD-10-CM | POA: Diagnosis not present

## 2024-07-02 DIAGNOSIS — R262 Difficulty in walking, not elsewhere classified: Secondary | ICD-10-CM | POA: Diagnosis not present

## 2024-07-02 DIAGNOSIS — F418 Other specified anxiety disorders: Secondary | ICD-10-CM | POA: Diagnosis not present

## 2024-07-02 DIAGNOSIS — C180 Malignant neoplasm of cecum: Secondary | ICD-10-CM | POA: Diagnosis not present

## 2024-07-02 DIAGNOSIS — M6281 Muscle weakness (generalized): Secondary | ICD-10-CM | POA: Diagnosis not present

## 2024-07-02 DIAGNOSIS — D500 Iron deficiency anemia secondary to blood loss (chronic): Secondary | ICD-10-CM | POA: Diagnosis not present

## 2024-07-02 DIAGNOSIS — Z7901 Long term (current) use of anticoagulants: Secondary | ICD-10-CM | POA: Diagnosis not present

## 2024-07-02 DIAGNOSIS — E785 Hyperlipidemia, unspecified: Secondary | ICD-10-CM | POA: Diagnosis not present

## 2024-07-02 DIAGNOSIS — R488 Other symbolic dysfunctions: Secondary | ICD-10-CM | POA: Diagnosis not present

## 2024-07-02 DIAGNOSIS — I480 Paroxysmal atrial fibrillation: Secondary | ICD-10-CM | POA: Diagnosis not present

## 2024-07-02 DIAGNOSIS — E876 Hypokalemia: Secondary | ICD-10-CM | POA: Diagnosis not present

## 2024-07-02 DIAGNOSIS — M159 Polyosteoarthritis, unspecified: Secondary | ICD-10-CM | POA: Diagnosis not present

## 2024-07-02 DIAGNOSIS — D72829 Elevated white blood cell count, unspecified: Secondary | ICD-10-CM | POA: Diagnosis not present

## 2024-07-02 DIAGNOSIS — E8721 Acute metabolic acidosis: Secondary | ICD-10-CM | POA: Diagnosis not present

## 2024-07-02 DIAGNOSIS — E559 Vitamin D deficiency, unspecified: Secondary | ICD-10-CM | POA: Diagnosis not present

## 2024-07-02 DIAGNOSIS — K922 Gastrointestinal hemorrhage, unspecified: Secondary | ICD-10-CM | POA: Diagnosis not present

## 2024-07-02 DIAGNOSIS — C182 Malignant neoplasm of ascending colon: Secondary | ICD-10-CM | POA: Diagnosis present

## 2024-07-02 LAB — BASIC METABOLIC PANEL WITH GFR
Anion gap: 8 (ref 5–15)
BUN: 27 mg/dL — ABNORMAL HIGH (ref 8–23)
CO2: 24 mmol/L (ref 22–32)
Calcium: 9.1 mg/dL (ref 8.9–10.3)
Chloride: 107 mmol/L (ref 98–111)
Creatinine, Ser: 0.67 mg/dL (ref 0.44–1.00)
GFR, Estimated: >60 mL/min (ref >60)
Glucose, Bld: 97 mg/dL (ref 70–99)
Potassium: 3.4 mmol/L — ABNORMAL LOW (ref 3.5–5.1)
Sodium: 139 mmol/L (ref 135–145)

## 2024-07-02 LAB — MAGNESIUM: Magnesium: 1.7 mg/dL (ref 1.7–2.4)

## 2024-07-02 LAB — PHOSPHORUS: Phosphorus: 2.9 mg/dL (ref 2.5–4.6)

## 2024-07-02 LAB — CBC
HCT: 30.4 % — ABNORMAL LOW (ref 36.0–46.0)
Hemoglobin: 8.9 g/dL — ABNORMAL LOW (ref 12.0–15.0)
MCH: 22.6 pg — ABNORMAL LOW (ref 26.0–34.0)
MCHC: 29.3 g/dL — ABNORMAL LOW (ref 30.0–36.0)
MCV: 77.2 fL — ABNORMAL LOW (ref 80.0–100.0)
Platelets: 263 K/uL (ref 150–400)
RBC: 3.94 MIL/uL (ref 3.87–5.11)
RDW: 23.9 % — ABNORMAL HIGH (ref 11.5–15.5)
WBC: 8.2 K/uL (ref 4.0–10.5)
nRBC: 0 % (ref 0.0–0.2)

## 2024-07-02 MED ORDER — ENSURE SURGERY PO LIQD: 237.0000 mL | Freq: Two times a day (BID) | ORAL | Status: AC

## 2024-07-02 MED ORDER — ALPRAZOLAM 0.25 MG PO TABS: 0.2500 mg | ORAL_TABLET | Freq: Every day | ORAL | 0 refills | Status: AC | PRN

## 2024-07-02 MED ORDER — SODIUM BICARBONATE 650 MG PO TABS
650.0000 mg | ORAL_TABLET | Freq: Two times a day (BID) | ORAL | Status: AC
Start: 2024-07-02 — End: ?

## 2024-07-02 MED ORDER — MAGNESIUM OXIDE -MG SUPPLEMENT 400 (240 MG) MG PO TABS: 400.0000 mg | ORAL_TABLET | Freq: Every day | ORAL | Status: AC

## 2024-07-02 MED ORDER — POTASSIUM CHLORIDE CRYS ER 20 MEQ PO TBCR
20.0000 meq | EXTENDED_RELEASE_TABLET | Freq: Once | ORAL | Status: AC
  Administered 2024-07-02: 20 meq via ORAL
  Filled 2024-07-02: qty 1

## 2024-07-02 MED ORDER — TRAMADOL HCL 50 MG PO TABS: 50.0000 mg | ORAL_TABLET | Freq: Four times a day (QID) | ORAL | 0 refills | Status: DC | PRN

## 2024-07-02 MED ORDER — MAGNESIUM OXIDE -MG SUPPLEMENT 400 (240 MG) MG PO TABS
400.0000 mg | ORAL_TABLET | Freq: Every day | ORAL | Status: DC
  Filled 2024-07-02: qty 1

## 2024-07-02 MED ORDER — PANTOPRAZOLE SODIUM 40 MG PO TBEC: 40.0000 mg | DELAYED_RELEASE_TABLET | Freq: Every day | ORAL | Status: AC

## 2024-07-02 NOTE — Progress Notes (Signed)
 Mobility Specialist Progress Note:    07/02/24 1112  Mobility  Activity Transferred from bed to chair  Level of Assistance Moderate assist, patient does 50-74%  Assistive Device Front wheel walker  Distance Ambulated (ft) 3 ft  Range of Motion/Exercises Active;All extremities  Activity Response Tolerated well  Mobility Referral Yes  Mobility visit 1 Mobility  Mobility Specialist Start Time (ACUTE ONLY) 1112  Mobility Specialist Stop Time (ACUTE ONLY) 1130  Mobility Specialist Time Calculation (min) (ACUTE ONLY) 18 min   Pt received in bed, visitor in room. Agreeable to mobility, required ModA to stand and transfer with RW. Tolerated well,asx throughout. All needs met.  Diamond Martucci Mobility Specialist Please contact via Special educational needs teacher or  Rehab office at [phone]

## 2024-07-02 NOTE — Progress Notes (Signed)
 Spoke with Carole at [facility] and provided patient report.

## 2024-07-02 NOTE — TOC Transition Note (Signed)
 Transition of Care [facility]) - Discharge Note   Patient Details  Name: Hailey Graham MRN: [id number] Date of Birth: 11-05-1928  Transition [facility]) CM/SW Contact:  Lucie Lunger, LCSWA Phone Number: 07/02/2024, 11:56 AM   Clinical Narrative:    CSW updated that pt is medically stable for D/C to [facility] today. CSW spoke to [place] with [facility] who states they are ready to accept pt. CSW sent D/C clinicals via HUB to [facility]. Kerri to call pts niece Donny to review admission packet. Room and report sent to RN. TOC signing off.   Final next level of care: Skilled Nursing Facility Barriers to Discharge: Barriers Resolved   Patient Goals and CMS Choice Patient states their goals for this hospitalization and ongoing recovery are:: go to SNF CMS Medicare.gov Compare Post Acute Care list provided to:: Patient Choice offered to / list presented to : Patient [place] ownership interest in [facility]provided to:: Patient    Discharge Placement              Patient chooses bed at: [facility] Patient to be transferred to facility by: facility staff Name of family member notified: pt aware and niece being called by facility Patient and family notified of of transfer: 07/02/24  Discharge Plan and Services Additional resources added to the After Visit Summary for   In-house Referral: Clinical Social Work Discharge Planning Services: CM Consult Post Acute Care Choice: Skilled Nursing Facility                               Social Drivers of Health (SDOH) Interventions SDOH Screenings   Food Insecurity: No Food Insecurity (06/22/2024)  Housing: Low Risk  (06/22/2024)  Transportation Needs: No Transportation Needs (06/22/2024)  Utilities: Not At Risk (06/22/2024)  Depression (PHQ2-9): Low Risk  (02/21/2020)  Social Connections: Socially Isolated (06/22/2024)  Tobacco Use: Low Risk  (06/28/2024)     Readmission Risk Interventions     No data to display

## 2024-07-02 NOTE — Discharge Summary (Signed)
 Physician Discharge Summary   Patient: Hailey Graham MRN: [id number] DOB: 16-Feb-1928  Admit date:     06/22/2024  Discharge date: 07/02/24  Discharge Physician: Alm Bradin Mcadory   PCP: Sheryle Carwin, MD   Recommendations at discharge:   Please follow up with primary care provider within 1-2 weeks  Please repeat BMP and CBC in one week appointment at [facility] 07/15/24 at [facility] Course: 89 y.o. female with medical history significant of paroxysmal atrial fibrillation on Xarelto  and no other medical history who saw her cardiologist last Thursday for routine checkup, she was supposed to go for the lab check next day Friday but some reason order was not sent to the laboratory.  She went yesterday, blood was drawn, however CBC resulted today with hemoglobin under 7 so she was called by her cardiologist Dr. Okey to present to the ED.  Patient herself has no complaints at all.  She denies any nausea, vomiting, chest pain, shortness of breath, palpitation, any problem with urination or bowel bowel movements, no hematochezia, hematemesis or melena.   ED Course: Upon arrival to ED, patient fairly stable hemodynamically.  CBC showed hemoglobin of 5.6.  FOBT positive.  BMP not drawn.  2 units of PRBC transfusion ordered.  ED physician consulted GI, they recommended admission.  Hospitalist called for admission. Colonoscopy was done 06/25/24 and showed a cecal mass.  General surgery was consulted and patient underwent right hemicolectomy on 06/28/24 with liver biopsy.  Post-op, her diet was gradually advanced as her bowel function returned.  Her pain was well controlled.  She tolerated her diet.  Her electrolytes were optimized.  Her Hgb remained stable without any signs of active blood loss.  Assessment and Plan: Acute blood loss anemia secondary to lower GI bleed, --POA: Hemoglobin 5.6.  2 units PRBC transfusion ordered by ED.   --GI is consulted and colonoscopy with findings of a large cecal mass.   Holding Xarelto . --consulted to general surgery; pt and family decided to have the hemicolectomy after weighing the risks, benefits --appreciate GI and surgery consult --pt was offered right hemicolectomy and afted discussing with family and surgeon she would like to proceed with surgery --pt was taken to OR on 7/7 for right hemicolectomy and liver biopsy with Dr Kallie --Postop s/p right hemicolectomy and liver biopsy 06/28/24 - she is recovering well postoperatively and surgery team advanced diet to full liquids>>soft diet on 06/30/24 --working on ambulating, getting up to chair and requested PT eval>>SNF --anticipating need for short term rehab; pt requesting [facility], TOC team updated  --06/25/24 colonoscopy path = adenocarcinoma --06/28/24 surgical path = pending --appointment at [facility] 07/15/24 at 11AM   Paroxysmal atrial fibrillation: Currently in sinus rhythm.  -Resumed home Toprol -XL for rate control. --Pt says she takes 12.5 mg twice daily which we have resumed  --resume Xaelto   Metabolic acidosis -- added sodium bicarbonate  tablets on 7/8   Anxiety:  -Resumed home dose of Xanax .   History of left breast cancer  -- Left lumpectomy 12/13/2015: Invasive ductal carcinoma 0.4 cm, DCIS 1.6 cm, clear margins, ER 100%, PR 100%, HER-2 negative  -- could not tolerate tamoxifen  -- resumed home arimedex    Hypokalemia/Hypophosphatemia -- repleting   Leukocytosis -- reactive from surgery -- remains afebrile and hemodynamically stable -- improved          Consultants: GI, general surgery Procedures performed: none  Disposition: Skilled nursing facility Diet recommendation:  Soft diet DISCHARGE MEDICATION:  Allergies as of 07/02/2024       Reactions   Brimonidine Other (See Comments)   Crusting - difficulty opening eyes   Sulfa Drugs Cross Reactors Other (See Comments)   Unknown    Codeine Nausea Only   Morphine Nausea Only   Sulfamethoxazole Nausea Only         Medication List     TAKE these medications    acetaminophen  500 MG tablet Commonly known as: TYLENOL  Take 1,000 mg by mouth as needed for mild pain (pain score 1-3), moderate pain (pain score 4-6), fever or headache.   ALPRAZolam  0.25 MG tablet Commonly known as: XANAX  Take 1 tablet (0.25 mg total) by mouth daily as needed for anxiety. What changed:  medication strength when to take this additional instructions Another medication with the same name was removed. Continue taking this medication, and follow the directions you see here.   anastrozole  1 MG tablet Commonly known as: ARIMIDEX  Take 1 tablet (1 mg total) by mouth daily.   B-12 1000 MCG Tabs Take 1 tablet by mouth daily.   dorzolamide -timolol  2-0.5 % ophthalmic solution Commonly known as: COSOPT  Place 1 drop into both eyes 2 (two) times daily.   ezetimibe -simvastatin  10-20 MG tablet Commonly known as: VYTORIN  Take 1 tablet by mouth daily.   feeding supplement Liqd Take 237 mLs by mouth 2 (two) times daily between meals.   latanoprost  0.005 % ophthalmic solution Commonly known as: XALATAN  Place 1 drop into both eyes at bedtime.   magnesium  oxide 400 (240 Mg) MG tablet Commonly known as: MAG-OX Take 1 tablet (400 mg total) by mouth daily. X 4 days   metoprolol  succinate 25 MG 24 hr tablet Commonly known as: TOPROL -XL TAKE (1/2) TABLET BY MOUTH TWICE DAILY What changed: See the new instructions.   pantoprazole  40 MG tablet Commonly known as: PROTONIX  Take 1 tablet (40 mg total) by mouth daily.   pilocarpine  1 % ophthalmic solution Commonly known as: PILOCAR Place 1 drop into the right eye 4 times daily.   rivaroxaban  20 MG Tabs tablet Commonly known as: XARELTO  Take 20 mg by mouth daily with supper.   sodium bicarbonate  650 MG tablet Take 1 tablet (650 mg total) by mouth 2 (two) times daily.   traMADol  50 MG tablet Commonly known as: ULTRAM  Take 1 tablet (50 mg total) by mouth every 6 (six)  hours as needed for moderate pain (pain score 4-6).   Vitamin D  50 MCG (2000 UT) tablet Take 2,000 Units by mouth daily.        Contact information for follow-up providers     Kallie Manuelita BROCKS, MD Follow up on 07/14/2024.   Specialty: General Surgery Why: staple removal Contact information: [address] [place] [facility] [address] [phone]         Davonna Siad, MD Follow up.   Specialty: Oncology Why: July 15, 2024 at 11AM Contact information: [address] [place] [address] [phone]              Contact information for after-discharge care     Destination     [facility] .   Service: Skilled Nursing Contact information: [address] [place]  [address] [phone]                    Discharge Exam: Filed Weights   06/29/24 0432 06/30/24 0421 07/02/24 0345  Weight: 71.8 kg 70.9 kg 72.6 kg   HEENT:  [place]/AT, No thrush, no  icterus CV:  RRR, no rub, no S3, no S4 Lung:  fine bibasilar  crackles. No wheeze Abd:  soft/+BS, NT Ext:  Non pitting edema, no lymphangitis, no synovitis, no rash   Condition at discharge: stable  The results of significant diagnostics from this hospitalization (including imaging, microbiology, ancillary and laboratory) are listed below for reference.   Imaging Studies: DG Knee 1-2 Views Right Result Date: 06/25/2024 CLINICAL DATA:  Right knee pain, swelling EXAM: RIGHT KNEE - 1-2 VIEW COMPARISON:  03/30/2020 FINDINGS: Moderate tricompartment osteoarthritis with joint space narrowing and spurring, most pronounced in the lateral compartment. No joint effusion. No acute bony abnormality. Specifically, no fracture, subluxation, or dislocation. IMPRESSION: Moderate tricompartment osteoarthritis. No acute bony abnormality. Electronically Signed   By: Franky Crease M.D.   On: 06/25/2024 16:41   CT ABDOMEN PELVIS W CONTRAST Result Date: 06/25/2024 CLINICAL DATA:  Colorectal carcinoma.  Mass on colonoscopy. Anemia. Staging exam. * Tracking Code: BO * EXAM: CT ABDOMEN AND PELVIS WITH CONTRAST TECHNIQUE: Multidetector CT imaging of the abdomen and pelvis was performed using the standard protocol following bolus administration of intravenous contrast. RADIATION DOSE REDUCTION: This exam was performed according to the departmental dose-optimization program which includes automated exposure control, adjustment of the mA and/or kV according to patient size and/or use of iterative reconstruction technique. CONTRAST:  OMNIPAQUE  IOHEXOL  300 MG/ML  SOLN COMPARISON:  Contrast CT 01/25/2011 FINDINGS: Lower chest: Lung bases are clear. Hepatobiliary: Hypoenhancing mass in the lateral aspect of the LEFT hepatic lobe measures 3.9 x 3.1 cm. This lesion was not present on contrast CT from 01/25/2011. Small gallstones in the gallbladder. No biliary duct dilatation Pancreas: Pancreas is normal. No ductal dilatation. No pancreatic inflammation. Spleen: Normal spleen Adrenals/urinary tract: Adrenal glands and kidneys are normal. The ureters and bladder normal. Stomach/Bowel: Stomach, duodenum and small bowel are normal. Circumferential thickening in the cecum (image 46/2). No discrete measurable mass. Appendix not identified. Ascending, transverse descending colon. Multiple diverticula through the sigmoid colon. No obstructing mass lesion identified. No lymphadenopathy within the sigmoid mesocolon. There is enlarged lymph node in ileocecal mesentery just ventral to the RIGHT psoas muscle measuring 19 mm x 10 mm on image 46/2. Vascular/Lymphatic: Abdominal aorta is normal caliber. No periportal or retroperitoneal adenopathy. No pelvic adenopathy. Reproductive: Post hysterectomy.  Adnexa unremarkable Other: No free fluid. Musculoskeletal: No aggressive osseous lesion. IMPRESSION: 1. Circumferential thickening in the cecum concerning for primary colon adenocarcinoma given the endoscopy findings. 2. Enlarged lymph  node in the ileocecal mesentery concerning for local nodal metastasis. 3. Hypoenhancing mass in the LEFT hepatic lobe concerning for hepatic metastasis. 4. Cholelithiasis without evidence cholecystitis. 5. Sigmoid diverticulosis without evidence diverticulitis. Electronically Signed   By: Jackquline Boxer M.D.   On: 06/25/2024 15:46   DG Chest Port 1 View Result Date: 06/25/2024 CLINICAL DATA:  Colon mass.  History of breast cancer. EXAM: PORTABLE CHEST 1 VIEW COMPARISON:  10/01/2021. FINDINGS: The heart size and mediastinal contours are within normal limits. There is atherosclerotic calcification aorta. Mild atelectasis is noted at the lung bases bilaterally. No effusion or pneumothorax is seen. Surgical clips are noted in the left breast. No acute osseous abnormality. IMPRESSION: Mild atelectasis at the lung bases. Electronically Signed   By: Leita Birmingham M.D.   On: 06/25/2024 15:40    Microbiology: Results for orders placed or performed during the hospital encounter of 06/22/24  Surgical PCR screen     Status: None   Collection Time: 06/27/24 10:09 AM  Specimen: Nasal Mucosa; Nasal Swab  Result Value Ref Range Status   MRSA, PCR NEGATIVE NEGATIVE Final   Staphylococcus aureus NEGATIVE NEGATIVE Final    Comment: (NOTE) The Xpert SA Assay (FDA approved for NASAL specimens in patients 53 years of age and older), is one component of a comprehensive surveillance program. It is not intended to diagnose infection nor to guide or monitor treatment. Performed at [facility], [address] [place], [place] [address]     Labs: CBC: Recent Labs  Lab 06/28/24 0518 06/29/24 0422 06/30/24 0414 07/01/24 0410 07/02/24 0501  WBC 10.1 14.2* 13.2* 10.8* 8.2  HGB 9.7* 10.0* 9.7* 9.5* 8.9*  HCT 32.0* 34.8* 32.6* 32.3* 30.4*  MCV 77.1* 77.7* 77.3* 76.9* 77.2*  PLT 233 292 280 274 263   Basic Metabolic Panel: Recent Labs  Lab 06/28/24 0518 06/29/24 0422 06/30/24 0414 07/01/24 0410  07/02/24 0501  NA 135 137 136 139 139  K 4.0 4.4 3.9 3.6 3.4*  CL 107 108 106 108 107  CO2 17* 18* 21* 25 24  GLUCOSE 80 141* 133* 88 97  BUN 23 27* 30* 30* 27*  CREATININE 0.70 0.81 0.65 0.66 0.67  CALCIUM 9.4 9.9 9.7 9.3 9.1  MG  --   --   --  1.9 1.7  PHOS  --   --   --  1.6* 2.9   Liver Function Tests: No results for input(s): AST, ALT, ALKPHOS, BILITOT, PROT, ALBUMIN in the last 168 hours. CBG: No results for input(s): GLUCAP in the last 168 hours.  Discharge time spent: greater than 30 minutes.  Signed: Alm Schneider, MD [facility] 07/02/2024

## 2024-07-02 NOTE — Anesthesia Postprocedure Evaluation (Signed)
 Anesthesia Post Note  Patient: Hailey Graham  Procedure(s) Performed: RIGHT HEMI-COLECTOMY WITH  LIVER BIOPSY (Right: Abdomen)  Patient location during evaluation: Phase II Anesthesia Type: General Level of consciousness: awake Pain management: pain level controlled Vital Signs Assessment: post-procedure vital signs reviewed and stable Respiratory status: spontaneous breathing and respiratory function stable Cardiovascular status: blood pressure returned to baseline and stable Postop Assessment: no headache and no apparent nausea or vomiting Anesthetic complications: no Comments: Late entry   No notable events documented.   Last Vitals:  Vitals:   07/01/24 2252 07/02/24 0345  BP: 114/64 95/65  Pulse: 84 (!) 58  Resp: 18 18  Temp: 37.4 C 36.6 C  SpO2: 93% 95%    Last Pain:  Vitals:   07/02/24 0715  TempSrc:   PainSc: Asleep                 Yvonna JINNY Bosworth

## 2024-07-02 NOTE — Plan of Care (Signed)
   Problem: Education: Goal: Knowledge of General Education information will improve Description: Including pain rating scale, medication(s)/side effects and non-pharmacologic comfort measures Outcome: Progressing   Problem: Clinical Measurements: Goal: Will remain free from infection Outcome: Progressing   Problem: Activity: Goal: Risk for activity intolerance will decrease Outcome: Progressing

## 2024-07-05 ENCOUNTER — Non-Acute Institutional Stay (SKILLED_NURSING_FACILITY): Payer: Self-pay | Admitting: Adult Health

## 2024-07-05 ENCOUNTER — Encounter: Payer: Self-pay | Admitting: Adult Health

## 2024-07-05 DIAGNOSIS — I480 Paroxysmal atrial fibrillation: Secondary | ICD-10-CM | POA: Diagnosis not present

## 2024-07-05 DIAGNOSIS — Z170 Estrogen receptor positive status [ER+]: Secondary | ICD-10-CM

## 2024-07-05 DIAGNOSIS — F418 Other specified anxiety disorders: Secondary | ICD-10-CM

## 2024-07-05 DIAGNOSIS — H40053 Ocular hypertension, bilateral: Secondary | ICD-10-CM | POA: Diagnosis not present

## 2024-07-05 DIAGNOSIS — C180 Malignant neoplasm of cecum: Secondary | ICD-10-CM

## 2024-07-05 DIAGNOSIS — K922 Gastrointestinal hemorrhage, unspecified: Secondary | ICD-10-CM

## 2024-07-05 DIAGNOSIS — C50412 Malignant neoplasm of upper-outer quadrant of left female breast: Secondary | ICD-10-CM

## 2024-07-05 DIAGNOSIS — E785 Hyperlipidemia, unspecified: Secondary | ICD-10-CM | POA: Diagnosis not present

## 2024-07-05 DIAGNOSIS — E872 Acidosis, unspecified: Secondary | ICD-10-CM

## 2024-07-05 DIAGNOSIS — E876 Hypokalemia: Secondary | ICD-10-CM | POA: Diagnosis not present

## 2024-07-05 DIAGNOSIS — D500 Iron deficiency anemia secondary to blood loss (chronic): Secondary | ICD-10-CM

## 2024-07-05 NOTE — Progress Notes (Signed)
 Location:  [facility] Room Number: 130 Place of Service:  SNF (31)   CODE STATUS: dnr   Allergies  Allergen Reactions   Brimonidine Other (See Comments)    Crusting - difficulty opening eyes   Sulfa Drugs Cross Reactors Other (See Comments)    Unknown    Codeine Nausea Only   Morphine Nausea Only   Sulfamethoxazole Nausea Only    Chief Complaint  Patient presents with   Hospitalization Follow-up    HPI:  She is  89 year old woman who has been hospitalized from 06-22-24 through 07-02-24. Her past medical history includes: left breast cancer (lumpectomy 12-13-15) on arimedex; atrial fibrillation on xarelto ; hyperlipidemia. She presented to the ED. After seeing her cardiologist she had lab drawn was found to have hgb <7. She was called by her cardiologist Dr. Okey to present to the ED. She had no complaints of nausea, vomiting, changes in bowel; no blood in stools; no black tarry stools. She had a colonoscopy on 06-25-24: demonstrated cecal mass(her colonoscopy path: adenocarcinoma) . She had a general surgery consult and underwent a hemicolectomy on 06-28-24 with a liver biopsy. Her diet was gradually increased and her bowel function returned.She was treated for acute blood loss secondary to lower GI bleed: did receive 2 units of packed cells.  She is here for short term rehab with her goal to return back home. She will continue to be followed for her chronic illnesses including: Lower GI bleed/iron deficiency anemia due to chronic blood loss: Cecal cancer;  Situational anxiety: Hyperlipidemia unspecified hyperlipidemia type    Past Medical History:  Diagnosis Date   Allergy    Anxiety    Arthritis    Breast cancer of upper-outer quadrant of left female breast (HCC) 11/24/2015   Cataract    Complication of anesthesia    Depression    Diverticulosis    Gait abnormality    Gastroesophageal reflux disease    Glaucoma    Hyperlipidemia    Lipid profile in  05/2010:149, 86, 48, 84   Lung nodule    Right lower lobe; stable in 2010   Paroxysmal atrial fibrillation (HCC)    Rate related right bundle branch block; normal EF on echo in 2006; normal stress nuclear-2006; mild RV hypokinesis on MRI;  RA and RV enlargement on CT in 9/06; Anticoagulation->discontinued in 2009   Personal history of kidney stones    PONV (postoperative nausea and vomiting)    Nausea    Past Surgical History:  Procedure Laterality Date   ABDOMINAL HYSTERECTOMY  2202   ANKLE SURGERY Right 12/23/2001   Fracture- rod   APPENDECTOMY     as a teenager   BREAST LUMPECTOMY Left 2016   invasive ductal ca   BREAST LUMPECTOMY WITH RADIOACTIVE SEED LOCALIZATION Left 12/13/2015   Procedure: BREAST LUMPECTOMY WITH RADIOACTIVE SEED LOCALIZATION;  Surgeon: Morene Olives, MD;  Location: [facility] OR;  Service: General;  Laterality: Left;   CATARACT EXTRACTION W/ INTRAOCULAR LENS IMPLANT Right 12/23/2012   COLONOSCOPY N/A 06/25/2024   Procedure: COLONOSCOPY;  Surgeon: Shaaron Lamar HERO, MD;  [facility];  Service: Endoscopy;  Laterality: N/A;   COLONOSCOPY W/ POLYPECTOMY     COLOSTOMY REVISION Right 06/28/2024   Procedure: RIGHT HEMI-COLECTOMY WITH  LIVER BIOPSY;  Surgeon: Kallie Manuelita BROCKS, MD;  Location: [facility] ORS;  Service: General;  Laterality: Right;   ESOPHAGOGASTRODUODENOSCOPY     ESOPHAGOGASTRODUODENOSCOPY N/A 06/25/2024   Procedure: EGD (ESOPHAGOGASTRODUODENOSCOPY);  Surgeon: Shaaron Lamar  M, MD;  Location: [facility];  Service: Endoscopy;  Laterality: N/A;   GLAUCOMA SURGERY Right 12/23/2012   HERNIA REPAIR Right    1990's   ORIF ANKLE FRACTURE  12/23/2001   Right   TONSILLECTOMY     age 43   Varicose veins Right 2207    Social History   Socioeconomic History   Marital status: Widowed    Spouse name: Not on file   Number of children: 0   Years of education: 15   Highest education level: Not on file  Occupational History   Occupation: Retired  Tobacco Use    Smoking status: Never   Smokeless tobacco: Never  Vaping Use   Vaping status: Never Used  Substance and Sexual Activity   Alcohol use: No    Alcohol/week: 0.0 standard drinks of alcohol   Drug use: No   Sexual activity: Not Currently  Other Topics Concern   Not on file  Social History Narrative   Lives at home with husband.   Right-handed.   No caffeine use.   Social Drivers of Corporate investment banker Strain: Not on file  Food Insecurity: No Food Insecurity (06/22/2024)   Hunger Vital Sign    Worried About Running Out of Food in the Last Year: Never true    Ran Out of Food in the Last Year: Never true  Transportation Needs: No Transportation Needs (06/22/2024)   PRAPARE - Administrator, Civil Service (Medical): No    Lack of Transportation (Non-Medical): No  Physical Activity: Not on file  Stress: Not on file  Social Connections: Socially Isolated (06/22/2024)   Social Connection and Isolation Panel    Frequency of Communication with Friends and Family: More than three times a week    Frequency of Social Gatherings with Friends and Family: Twice a week    Attends Religious Services: Never    Database administrator or Organizations: No    Attends Banker Meetings: Never    Marital Status: Widowed  Intimate Partner Violence: Not At Risk (06/22/2024)   Humiliation, Afraid, Rape, and Kick questionnaire    Fear of Current or Ex-Partner: No    Emotionally Abused: No    Physically Abused: No    Sexually Abused: No   Family History  Problem Relation Age of Onset   Healthy Mother    Cerebral aneurysm Father    Lung cancer Sister       VITAL SIGNS BP 107/61   Pulse 74   Temp (!) 97 F (36.1 C)   Resp 18   Ht 5' 6 (1.676 m)   Wt 152 lb 12.8 oz (69.3 kg)   SpO2 100%   BMI 24.66 kg/m   Outpatient Encounter Medications as of 07/05/2024  Medication Sig Note   acetaminophen  (TYLENOL ) 500 MG tablet Take 1,000 mg by mouth as needed for mild  pain (pain score 1-3), moderate pain (pain score 4-6), fever or headache.    ALPRAZolam  (XANAX ) 0.25 MG tablet Take 1 tablet (0.25 mg total) by mouth daily as needed for anxiety.    anastrozole  (ARIMIDEX ) 1 MG tablet Take 1 tablet (1 mg total) by mouth daily.    Cholecalciferol (VITAMIN D ) 2000 UNITS tablet Take 2,000 Units by mouth daily.    Cyanocobalamin (B-12) 1000 MCG TABS Take 1 tablet by mouth daily.    dorzolamide -timolol  (COSOPT ) 22.3-6.8 MG/ML ophthalmic solution Place 1 drop into both eyes 2 (two) times daily.  ezetimibe -simvastatin  (VYTORIN ) 10-20 MG tablet Take 1 tablet by mouth daily.    feeding supplement (ENSURE SURGERY) LIQD Take 237 mLs by mouth 2 (two) times daily between meals.    latanoprost  (XALATAN ) 0.005 % ophthalmic solution Place 1 drop into both eyes at bedtime.    magnesium  oxide (MAG-OX) 400 (240 Mg) MG tablet Take 1 tablet (400 mg total) by mouth daily. X 4 days    metoprolol  succinate (TOPROL -XL) 25 MG 24 hr tablet TAKE (1/2) TABLET BY MOUTH TWICE DAILY (Patient taking differently: Take 12.5 mg by mouth in the morning and at bedtime.)    pantoprazole  (PROTONIX ) 40 MG tablet Take 1 tablet (40 mg total) by mouth daily.    pilocarpine  (PILOCAR) 1 % ophthalmic solution Place 1 drop into the right eye 4 times daily. 06/22/2024: Comes from [facility] [address], [place]   rivaroxaban  (XARELTO ) 20 MG TABS tablet Take 20 mg by mouth daily with supper.    sodium bicarbonate  650 MG tablet Take 1 tablet (650 mg total) by mouth 2 (two) times daily.    traMADol  (ULTRAM ) 50 MG tablet Take 1 tablet (50 mg total) by mouth every 6 (six) hours as needed for moderate pain (pain score 4-6).    Facility-Administered Encounter Medications as of 07/05/2024  Medication   fentaNYL  (SUBLIMAZE ) injection   lactated ringers  infusion   midazolam  (VERSED ) injection   propofol  (DIPRIVAN ) 10 mg/mL bolus/IV push     SIGNIFICANT DIAGNOSTIC EXAMS  LABS 06-22-24: wbc 4.9; hgb 5.6; hct 20.5;  mcv 72.2 plt 247; glucose 99; bun 21; creat 0.74;k+ 3.6; na++ 136; ca 9.5; gfr >60 06-25-24: wbc 9.1; hgb 10.5; hct 34.7; mcv 76.1 plt 264; glucose 97; bun 10; creat 0.60; k+ 3.3; na++ 136; ca 9.2 gfr>60  07-02-24: wbc 8.2; hgb 8.9; hct 30.4; mcv 77.2; plt 263; glucose 97 ;bun 27; creat 0.67; k+ 3.4; na++ 139; ca 9.1; gfr >60 mag 1.7; phos 2.9   Review of Systems  Constitutional:  Positive for malaise/fatigue.  Respiratory:  Negative for cough and shortness of breath.   Cardiovascular:  Negative for chest pain, palpitations and leg swelling.  Gastrointestinal:  Negative for abdominal pain, constipation and heartburn.  Musculoskeletal:  Positive for back pain. Negative for joint pain and myalgias.  Skin: Negative.   Neurological:  Negative for dizziness.  Psychiatric/Behavioral:  The patient is not nervous/anxious.    Physical Exam Constitutional:      General: She is not in acute distress.    Appearance: She is well-developed. She is not diaphoretic.  Neck:     Thyroid : No thyromegaly.  Cardiovascular:     Rate and Rhythm: Normal rate and regular rhythm.     Heart sounds: Normal heart sounds.  Pulmonary:     Effort: Pulmonary effort is normal. No respiratory distress.     Breath sounds: Normal breath sounds.  Abdominal:     General: Bowel sounds are normal. There is no distension.     Palpations: Abdomen is soft.     Tenderness: There is no abdominal tenderness.  Musculoskeletal:        General: Normal range of motion.     Cervical back: Neck supple.     Right lower leg: No edema.     Left lower leg: No edema.  Lymphadenopathy:     Cervical: No cervical adenopathy.  Skin:    General: Skin is warm and dry.  Neurological:     Mental Status: She is alert and oriented to person, place,  and time.  Psychiatric:        Mood and Affect: Mood normal.      ASSESSMENT/ PLAN:  TODAY  Lower GI bleed/iron deficiency anemia due to chronic blood loss: hgb 8.9; will repeat labs.   2.  Cecal cancer; is due to see oncology 07-15-24  3. Situational anxiety: will continue xanax  0.25 g daily as needed through 07-15-24  4. Hyperlipidemia unspecified hyperlipidemia type: will continue vytorin  40/10 mg daily   5. Increased intraocular pressure bilateral: will continue cosopt  both eyes twice daily; latanoprost  both eyes nightly pilocarpine  right eye four times daily   6. Paroxysmal atrial fibrillation: heart rate stable will continue toprol  xl 12.5 mg twice daily for rate control and xarelto  20 mg daily   7. Malignant neoplasm of upper outer left breast in female estrogen receptor positive: will continue anastrozole  1 mg daily   8. Hypomagnesemia/hypokalemia: will continue mag ox 400 mg daily through 07-06-24 will repeat labs.   9. Metabolic acidosis: will continue sodium bicarb 650 mg twice daily   Will check cbc; iron studies; bmp and mag levels.   Barnie Seip NP [name]  call [phone]

## 2024-07-06 ENCOUNTER — Encounter: Payer: Self-pay | Admitting: Internal Medicine

## 2024-07-06 ENCOUNTER — Telehealth: Payer: Self-pay | Admitting: *Deleted

## 2024-07-06 ENCOUNTER — Non-Acute Institutional Stay (SKILLED_NURSING_FACILITY): Payer: Self-pay | Admitting: Internal Medicine

## 2024-07-06 DIAGNOSIS — D500 Iron deficiency anemia secondary to blood loss (chronic): Secondary | ICD-10-CM

## 2024-07-06 DIAGNOSIS — C180 Malignant neoplasm of cecum: Secondary | ICD-10-CM

## 2024-07-06 DIAGNOSIS — I480 Paroxysmal atrial fibrillation: Secondary | ICD-10-CM | POA: Diagnosis not present

## 2024-07-06 LAB — SURGICAL PATHOLOGY

## 2024-07-06 NOTE — Assessment & Plan Note (Addendum)
 Currently rhythm is regular and rate well-controlled.  Xarelto  was stopped as IP because of the active lower GI bleed related to the adenocarcinoma of the colon. Xarelto  has been resumed; no bleeding dyscrasias reported.  Continue to monitor.

## 2024-07-06 NOTE — Telephone Encounter (Signed)
 Dr. Mavis reviewed pathology results for Dr. Kallie.   Reports that patient has known colon cancer. States that there are mets to liver, but the lymph nodes are clear.   Advised that Dr. Kallie will review in detail with patient at follow  up on 07/14/2024.  Call placed to patient. LMTRC.

## 2024-07-06 NOTE — Patient Instructions (Signed)
 See assessment and plan under each diagnosis in the problem list and acutely for this visit

## 2024-07-06 NOTE — Assessment & Plan Note (Signed)
 07/02/2024 H/H 8.9/30.4 with microcytic, hypochromic indices.  No active bleeding dyscrasias reported.  Continue iron supplementation.

## 2024-07-06 NOTE — Assessment & Plan Note (Signed)
 Follow-up at [facility] 07/15/2024.

## 2024-07-06 NOTE — Progress Notes (Unsigned)
 NURSING HOME LOCATION:  [facility] ROOM NUMBER:  130 P  CODE STATUS:  DNR  PCP:  Gaither Langton MD  This is a comprehensive admission note to this SNFperformed on this date less than 30 days from date of admission. Included are preadmission medical/surgical history; reconciled medication list; family history; social history and comprehensive review of systems.  Corrections and additions to the records were documented. Comprehensive physical exam was also performed. Additionally a clinical summary was entered for each active diagnosis pertinent to this admission in the Problem List to enhance continuity of care.  HPI: She was hospitalized 7/1 - 07/02/2024, admitted with profound but asymptomatic anemia.  She has been on Xarelto  for PAF and was to have a CBC checked in the cardiology's office.  Inadvertently this was delayed but upon completion hemoglobin was less than 7.  Dr. Okey referred her to the ED where hemoglobin was 5.6.  FOBT was positive.  The patient received 2 units of packed cells.  Xarelto  was held. She underwent colonoscopy on 7/4 which revealed a cecal mass.  Subsequently surgical path revealed adenocarcinoma.  Right hemicolectomy was completed 7/7 with a liver biopsy.  Follow-up at the [facility] was to be 7/24. Hospital course was complicated by reactive leukocytosis.  At the time of discharge final H/H was 8.9/30.4; indices were hypochromic, microcytic. PT/OT consulted and recommended SNF placement for rehab.  Past medical and surgical history: Includes essential hypertension, breast cancer on Arimidex , dyslipidemia, GERD, history of anxiety/depression, PAF, and history of nephrolithiasis. Surgeries and procedures include breast lumpectomy with seed implantation, & abdominal hysterectomy, and colonoscopy with polypectomy.  Family history: reviewed, non contributory due to advanced age.  Social history: Non-smoker, nondrinker.  Review of systems: She  describes occasional tingling in her toes which resolves with movement.  She confirms that she had no symptoms related to the profound hemoglobin which she delineated as 6.3.  She states I feel wonderful.  She states that she had no follow-up colonoscopies at the recommendation of her GI specialist as per standards of care after age 79.  She denied having any active GI symptoms.  She has recently had some watery stool which is improving.  Constitutional: No fever, significant weight change, fatigue  Eyes: No redness, discharge, pain, vision change ENT/mouth: No nasal congestion, purulent discharge, earache, change in hearing, sore throat  Cardiovascular: No chest pain, palpitations, paroxysmal nocturnal dyspnea, claudication, edema  Respiratory: No cough, sputum production, hemoptysis, DOE, significant snoring, apnea Gastrointestinal: No heartburn, dysphagia, abdominal pain, nausea /vomiting, rectal bleeding, melena, change in bowels Genitourinary: No dysuria, hematuria, pyuria, incontinence, nocturia Musculoskeletal: No joint stiffness, joint swelling, weakness, pain Dermatologic: No rash, pruritus, change in appearance of skin Neurologic: No dizziness, headache, syncope, seizures, numbness, tingling Psychiatric: No significant anxiety, depression, insomnia, anorexia Endocrine: No change in hair/skin/nails, excessive thirst, excessive hunger, excessive urination  Hematologic/lymphatic: No significant bruising, lymphadenopathy, abnormal bleeding Allergy/immunology: No itchy/watery eyes, significant sneezing, urticaria, angioedema  Physical exam:  Pertinent or positive findings: She is alert, oriented, and interactive.  She is quite a charming individual.  She is not wearing the lower partial.  Bowel sounds are hyperactive.  Surgical staples are present.  Interosseous wasting of the hands is noted.  Pedal pulses are decreased to palpation.  General appearance: Adequately nourished; no acute  distress, increased work of breathing is present.   Lymphatic: No lymphadenopathy about the head, neck, axilla. Eyes: No conjunctival inflammation or lid edema is present. There is no  scleral icterus. Ears:  External ear exam shows no significant lesions or deformities.   Nose:  External nasal examination shows no deformity or inflammation. Nasal mucosa are pink and moist without lesions, exudates Oral exam: Lips and gums are healthy appearing.There is no oropharyngeal erythema or exudate. Neck:  No thyromegaly, masses, tenderness noted.    Heart:  Normal rate and regular rhythm. S1 and S2 normal without gallop, murmur, click, rub.  Lungs: Chest clear to auscultation without wheezes, rhonchi, rales, rubs. Abdomen: Bowel sounds are normal.  Abdomen is soft and nontender with no organomegaly, hernias, masses. GU: Deferred  Extremities:  No cyanosis, clubbing, edema. Neurologic exam:  Strength equal  in upper & lower extremities. Balance, Rhomberg, finger to nose testing could not be completed due to clinical state Deep tendon reflexes are equal Skin: Warm & dry w/o tenting. No significant lesions or rash.  See clinical summary under each active problem in the Problem List with associated updated therapeutic plan

## 2024-07-07 NOTE — Telephone Encounter (Signed)
Call placed to patient. LMTRC.  

## 2024-07-08 ENCOUNTER — Ambulatory Visit (HOSPITAL_COMMUNITY)

## 2024-07-08 ENCOUNTER — Encounter (HOSPITAL_COMMUNITY): Payer: Self-pay | Admitting: Hematology

## 2024-07-09 ENCOUNTER — Other Ambulatory Visit (HOSPITAL_COMMUNITY)
Admission: RE | Admit: 2024-07-09 | Discharge: 2024-07-09 | Disposition: A | Discharge status: Home / Self Care | Source: Skilled Nursing Facility | Attending: Adult Health | Admitting: Adult Health

## 2024-07-09 DIAGNOSIS — E8721 Acute metabolic acidosis: Secondary | ICD-10-CM | POA: Insufficient documentation

## 2024-07-09 LAB — BASIC METABOLIC PANEL WITH GFR
Anion gap: 6 (ref 5–15)
BUN: 22 mg/dL (ref 8–23)
CO2: 24 mmol/L (ref 22–32)
Calcium: 8.9 mg/dL (ref 8.9–10.3)
Chloride: 109 mmol/L (ref 98–111)
Creatinine, Ser: 0.69 mg/dL (ref 0.44–1.00)
GFR, Estimated: >60 mL/min (ref >60)
Glucose, Bld: 87 mg/dL (ref 70–99)
Potassium: 3.4 mmol/L — ABNORMAL LOW (ref 3.5–5.1)
Sodium: 139 mmol/L (ref 135–145)

## 2024-07-09 LAB — IRON AND TIBC
Iron: 20 ug/dL — ABNORMAL LOW (ref 28–170)
Saturation Ratios: 8 % — ABNORMAL LOW (ref 10.4–31.8)
TIBC: 238 ug/dL — ABNORMAL LOW (ref 250–450)
UIBC: 218 ug/dL

## 2024-07-09 LAB — HEMOGLOBIN AND HEMATOCRIT, BLOOD
HCT: 25.8 % — ABNORMAL LOW (ref 36.0–46.0)
Hemoglobin: 7.7 g/dL — ABNORMAL LOW (ref 12.0–15.0)

## 2024-07-09 LAB — MAGNESIUM: Magnesium: 1.9 mg/dL (ref 1.7–2.4)

## 2024-07-11 ENCOUNTER — Other Ambulatory Visit: Payer: Self-pay | Admitting: Student

## 2024-07-12 ENCOUNTER — Telehealth (INDEPENDENT_AMBULATORY_CARE_PROVIDER_SITE_OTHER): Admitting: General Surgery

## 2024-07-12 ENCOUNTER — Other Ambulatory Visit (HOSPITAL_COMMUNITY)
Admission: RE | Admit: 2024-07-12 | Discharge: 2024-07-12 | Disposition: A | Discharge status: Home / Self Care | Source: Skilled Nursing Facility | Attending: Adult Health | Admitting: Adult Health

## 2024-07-12 DIAGNOSIS — C180 Malignant neoplasm of cecum: Secondary | ICD-10-CM

## 2024-07-12 DIAGNOSIS — E8721 Acute metabolic acidosis: Secondary | ICD-10-CM | POA: Insufficient documentation

## 2024-07-12 LAB — BASIC METABOLIC PANEL WITH GFR
Anion gap: 7 (ref 5–15)
BUN: 17 mg/dL (ref 8–23)
CO2: 23 mmol/L (ref 22–32)
Calcium: 9.1 mg/dL (ref 8.9–10.3)
Chloride: 109 mmol/L (ref 98–111)
Creatinine, Ser: 0.72 mg/dL (ref 0.44–1.00)
GFR, Estimated: >60 mL/min (ref >60)
Glucose, Bld: 86 mg/dL (ref 70–99)
Potassium: 3.6 mmol/L (ref 3.5–5.1)
Sodium: 139 mmol/L (ref 135–145)

## 2024-07-12 LAB — HEMOGLOBIN AND HEMATOCRIT, BLOOD
HCT: 28.2 % — ABNORMAL LOW (ref 36.0–46.0)
Hemoglobin: 8.4 g/dL — ABNORMAL LOW (ref 12.0–15.0)

## 2024-07-12 NOTE — Telephone Encounter (Signed)
 Patient family made aware by Dr. Kallie.

## 2024-07-12 NOTE — Telephone Encounter (Signed)
 [facility]  Updated Sharyne her niece about the pathology. Updated her about the liver biopsy being positive for colon cancer. I will see her on Wednesday to get the staples.   She has an appt with Dr. Davonna on Thursday.  Manuelita Pander, MD [facility] [address] [place], [place] [address] [phone] (office)

## 2024-07-14 ENCOUNTER — Ambulatory Visit (INDEPENDENT_AMBULATORY_CARE_PROVIDER_SITE_OTHER): Admitting: General Surgery

## 2024-07-14 ENCOUNTER — Encounter: Payer: Self-pay | Admitting: General Surgery

## 2024-07-14 VITALS — BP 103/67 | HR 92 | Temp 98.0°F | Resp 12 | Ht 66.0 in | Wt 152.0 lb

## 2024-07-14 DIAGNOSIS — C180 Malignant neoplasm of cecum: Secondary | ICD-10-CM

## 2024-07-14 NOTE — Patient Instructions (Addendum)
 Lab Results  Component Value Date   NA 139 07/12/2024   K 3.6 07/12/2024   CO2 23 07/12/2024   GLUCOSE 86 07/12/2024   BUN 17 07/12/2024   CREATININE 0.72 07/12/2024   CALCIUM 9.1 07/12/2024   EGFR 81 06/21/2024   GFRNONAA >60 07/12/2024   Lab Results  Component Value Date   WBC 8.2 07/02/2024   HGB 8.4 (L) 07/12/2024   HCT 28.2 (L) 07/12/2024   MCV 77.2 (L) 07/02/2024   PLT 263 07/02/2024   Lab work improving.  Continue diet as tolerated. Keep stools regular and soft. No heavy lifting > 10 lbs, excessive bending, pushing, pulling, or squatting for 8 weeks (08/23/2024) after surgery.  Continue to work with therapy.  You will discuss your options and the cancer further with Dr. Davonna.  Steristrips will peel off in the next 5-7 days. You can remove them once they are peeling off. It is ok to shower. Pat the area dry.

## 2024-07-14 NOTE — Progress Notes (Signed)
 [facility]  Doing well overall. No pain. Having Bms and eating. Pathology with cancer of the cecum, 0/24 lymph nodes positive but the liver mass was positive for colon cancer. Discussed this with her and Donny today.   BP 103/67   Pulse 92   Temp 98 F (36.7 C) (Oral)   Resp 12   Ht 5' 6 (1.676 m)   Wt 152 lb (68.9 kg)   SpO2 94%   BMI 24.53 kg/m  Soft, nondistended, nontender, staples removed, no erythema or drainage Steri strips placed   Labs from [facility] 7/21- Hgb improving  Lab Results  Component Value Date   NA 139 07/12/2024   K 3.6 07/12/2024   CO2 23 07/12/2024   GLUCOSE 86 07/12/2024   BUN 17 07/12/2024   CREATININE 0.72 07/12/2024   CALCIUM 9.1 07/12/2024   EGFR 81 06/21/2024   GFRNONAA >60 07/12/2024   Lab Results  Component Value Date   WBC 8.2 07/02/2024   HGB 8.4 (L) 07/12/2024   HCT 28.2 (L) 07/12/2024   MCV 77.2 (L) 07/02/2024   PLT 263 07/02/2024   Patient s/p R hemicolectomy and liver biopsy for Stage IV colon cancer with mets to the liver. Doing well overall.   Lab work improving.  Continue diet as tolerated. Keep stools regular and soft. No heavy lifting > 10 lbs, excessive bending, pushing, pulling, or squatting for 8 weeks (08/23/2024) after surgery.  Continue to work with therapy.  You will discuss your options and the cancer further with Dr. Davonna.  Steristrips will peel off in the next 5-7 days. You can remove them once they are peeling off. It is ok to shower. Pat the area dry.   Future Appointments  Date Time Provider Department Center  07/15/2024 11:00 AM Davonna Siad, MD [facility] None  07/15/2024 11:45 AM [facility] None  08/10/2024 11:15 AM Kallie Manuelita BROCKS, MD [facility] None  11/08/2024  1:00 PM Okey Vina GAILS, MD [facility]  H  11/23/2024 11:00 AM Odean Potts, MD [facility] None   Manuelita Kallie, MD [facility] [address] [place], [place]  [address] [phone] (office)

## 2024-07-15 ENCOUNTER — Inpatient Hospital Stay: Attending: Oncology | Admitting: Oncology

## 2024-07-15 ENCOUNTER — Inpatient Hospital Stay

## 2024-07-15 ENCOUNTER — Other Ambulatory Visit: Payer: Self-pay | Admitting: Student

## 2024-07-15 VITALS — BP 111/75 | HR 89 | Temp 98.2°F | Resp 18

## 2024-07-15 DIAGNOSIS — C787 Secondary malignant neoplasm of liver and intrahepatic bile duct: Secondary | ICD-10-CM | POA: Diagnosis not present

## 2024-07-15 DIAGNOSIS — D500 Iron deficiency anemia secondary to blood loss (chronic): Secondary | ICD-10-CM

## 2024-07-15 DIAGNOSIS — C189 Malignant neoplasm of colon, unspecified: Secondary | ICD-10-CM

## 2024-07-15 DIAGNOSIS — Z170 Estrogen receptor positive status [ER+]: Secondary | ICD-10-CM | POA: Diagnosis not present

## 2024-07-15 DIAGNOSIS — C182 Malignant neoplasm of ascending colon: Secondary | ICD-10-CM | POA: Insufficient documentation

## 2024-07-15 DIAGNOSIS — Z7189 Other specified counseling: Secondary | ICD-10-CM | POA: Diagnosis not present

## 2024-07-15 DIAGNOSIS — Z853 Personal history of malignant neoplasm of breast: Secondary | ICD-10-CM | POA: Diagnosis not present

## 2024-07-15 DIAGNOSIS — C50412 Malignant neoplasm of upper-outer quadrant of left female breast: Secondary | ICD-10-CM

## 2024-07-15 LAB — CBC WITH DIFFERENTIAL/PLATELET
Abs Immature Granulocytes: 0.03 K/uL (ref 0.00–0.07)
Basophils Absolute: 0.1 K/uL (ref 0.0–0.1)
Basophils Relative: 1 %
Eosinophils Absolute: 0.2 K/uL (ref 0.0–0.5)
Eosinophils Relative: 2 %
HCT: 31.7 % — ABNORMAL LOW (ref 36.0–46.0)
Hemoglobin: 9.3 g/dL — ABNORMAL LOW (ref 12.0–15.0)
Immature Granulocytes: 0 %
Lymphocytes Relative: 11 %
Lymphs Abs: 1.1 K/uL (ref 0.7–4.0)
MCH: 23.3 pg — ABNORMAL LOW (ref 26.0–34.0)
MCHC: 29.3 g/dL — ABNORMAL LOW (ref 30.0–36.0)
MCV: 79.3 fL — ABNORMAL LOW (ref 80.0–100.0)
Monocytes Absolute: 1.2 K/uL — ABNORMAL HIGH (ref 0.1–1.0)
Monocytes Relative: 13 %
Neutro Abs: 6.9 K/uL (ref 1.7–7.7)
Neutrophils Relative %: 73 %
Platelets: 453 K/uL — ABNORMAL HIGH (ref 150–400)
RBC: 4 MIL/uL (ref 3.87–5.11)
RDW: 27.4 % — ABNORMAL HIGH (ref 11.5–15.5)
WBC: 9.5 K/uL (ref 4.0–10.5)
nRBC: 0 % (ref 0.0–0.2)

## 2024-07-15 LAB — FERRITIN: Ferritin: 45 ng/mL (ref 11–307)

## 2024-07-15 LAB — IRON AND TIBC
Iron: 19 ug/dL — ABNORMAL LOW (ref 28–170)
Saturation Ratios: 6 % — ABNORMAL LOW (ref 10.4–31.8)
TIBC: 306 ug/dL (ref 250–450)
UIBC: 287 ug/dL

## 2024-07-15 NOTE — Patient Instructions (Addendum)
 [facility]  Discharge Instructions  You were seen and examined today by Dr. Davonna. Dr. Davonna is a medical oncologist, meaning that he specializes in the treatment of cancer diagnoses. Dr. Davonna discussed your past medical history, family history of cancers, and the events that led to you being here today.  You were referred to Dr. Davonna due to a new diagnosis of Stage IV Colon Cancer. It is a Stage IV cancer due to the fact that it spread to the liver.  Typically for Stage IV colon cancer, you would be given chemotherapy. Unfortunately, chemotherapy carries the risk of side effects that can impact your quality of life.  Dr. Davonna has recommended a CT chest to ensure there is no spread of the cancer to your lungs.  We will refer you to Dr. Dannielle at [facility] in [place] to discuss if radiation therapy is an option to the liver lesion if you would like to meet with him.  You were anemic. We will reassess with labs today and arrange for you to have IV iron.  Dr. Davonna will follow-up with you after your CT scan.   Thank you for choosing [facility] to provide your oncology and hematology care.   To afford each patient quality time with our provider, please arrive at least 15 minutes before your scheduled appointment time. You may need to reschedule your appointment if you arrive late (10 or more minutes). Arriving late affects you and other patients whose appointments are after yours.  Also, if you miss three or more appointments without notifying the office, you may be dismissed from the clinic at the provider's discretion.    Again, thank you for choosing [facility].  Our hope is that these requests will decrease the amount of time that you wait before being seen by our physicians.   If you have a lab appointment with the Cancer Center - please note that after April 8th, all labs will be drawn in the cancer  center.  You do not have to check in or register with the main entrance as you have in the past but will complete your check-in at the cancer center.            _____________________________________________________________  Should you have questions after your visit to [facility], please contact our office at [phone] and follow the prompts.  Our office hours are 8:00 a.m. to 4:30 p.m. Monday - Thursday and 8:00 a.m. to 2:30 p.m. Friday.  Please note that voicemails left after 4:00 p.m. may not be returned until the following business day.  We are closed weekends and all major holidays.  You do have access to a nurse 24-7, just call the main number to the clinic [phone] and do not press any options, hold on the line and a nurse will answer the phone.    For prescription refill requests, have your pharmacy contact our office and allow 72 hours.    Masks are no longer required in the cancer centers. If you would like for your care team to wear a mask while they are taking care of you, please let them know. You may have one support person who is at least 88 years old accompany you for your appointments.

## 2024-07-15 NOTE — Progress Notes (Signed)
 Hematology-Oncology Clinic Note  Sheryle Carwin, MD   Reason for Referral: Colon cancer  Oncology History: I have reviewed her chart and materials related to her cancer extensively and collaborated history with the patient. Summary of oncologic history is as follows:  Oncology History  Breast cancer of upper-outer quadrant of left female breast (HCC)  11/02/2015 Mammogram   screening mammogram category B breast density, asymmetry with indistinct margin in the left breast upper outer quadrant posterior depth, 2 internal mammary lymph nodes, 6 mm at 2:00 position axillary negative   11/21/2015 Initial Diagnosis   left breast biopsy: Invasive ductal carcinoma with associated low-grade DCIS with microcalcifications focally involving a papilloma, ER/PR 100%, HER-2 negative ratio 1.22   12/13/2015 Surgery   Left lumpectomy: Invasive ductal carcinoma 0.4 cm, DCIS 1.6 cm, clear margins, ER 100%, PR 100%, HER-2 negative ratio 1.22, Ki-67 5% T1 N0 stage IA   12/21/2015 - 03/20/2016 Anti-estrogen oral therapy   Tamoxifen  20 mg daily decreased to 10 mg daily, unable to tolerate it.   10/31/2020 Relapse/Recurrence   Recurrent disease: Multifocal  0.9 cm, 0.3 cm and an intramammary lymph node all 3 biopsy-proven grade 1 IDC ER/PR positive HER-2 equivocal FISH pending, Ki-67 2%   Colon cancer metastasized to liver (HCC)  06/25/2024 Imaging   CT abdomen and pelvis with contrast:  IMPRESSION: 1. Circumferential thickening in the cecum concerning for primary colon adenocarcinoma given the endoscopy findings. 2. Enlarged lymph node in the ileocecal mesentery concerning for local nodal metastasis. 3. Hypoenhancing mass in the LEFT hepatic lobe concerning for hepatic metastasis. 4. Cholelithiasis without evidence cholecystitis. 5. Sigmoid diverticulosis without evidence diverticulitis.   06/28/2024 Pathology Results   FINAL MICROSCOPIC DIAGNOSIS:  A. COLON, RIGHT, RESECTION: -  Invasive well to  moderately differentiated adenocarcinoma (4.5 cm in greatest dimension) with extension through submucosa, muscularis propria, subserosal tissue with adherence and extension into terminal ileum interpreted as pT4b. -  Margins negative -  24 lymph nodes negative for malignancy (0/24)  B. LIVER, BIOPSY: -  Metastatic adenocarcinoma morphologically consistent with the above colonic adenocarcinoma.  ONCOLOGY TABLE: Lymphovascular Invasion: Not identified Perineural Invasion: Not identified Margin Status for Invasive Carcinoma: All margins negative for invasive carcinoma Pathologic Stage Classification (pTNM, AJCC 8th Edition): pT4b, pN0,pM1a MMR/MSI testing : Preserved    07/15/2024 Initial Diagnosis   Colon cancer metastasized to liver (HCC)   07/15/2024 Cancer Staging   Staging form: Colon and Rectum, AJCC 8th Edition - Pathologic stage from 07/15/2024: Stage IVA (pT4b, pN0, pM1a) - Signed by Davonna Siad, MD on 07/15/2024 Stage prefix: Initial diagnosis Total positive nodes: 0 Total nodes examined: 24       History of Presenting Illness: Hailey Graham 89 y.o. female is referred by for newly diagnosed colon cancer with liver metastasis.  Patient is accompanied by her niece today who is also her power of care .  She has a past medical history of breast cancer initially diagnosed in 2016 s/p lumpectomy and a few months of tamoxifen  which was not tolerated very well.  Unsure if she was in another endocrine therapy after that.  She then had recurrence in the 2021 that was biopsy-proven and was started on anastrozole  which she is tolerating well and is currently taking.  She reported that she was recently in her cardiology office where she was found to have low hemoglobin and was sent to the ER for transfusion.  She was admitted to [facility] on 06/22/2024 and had a colonoscopy which revealed  a large bulky ileocecal mass, went for hemicolectomy proving colon cancer.  Patient has no complaints  today.  She is currently living in [place] center.  She also received multiple blood transfusions during this hospital admission.She feels well overall, with no significant symptoms related to her cancer. No recent weight loss or changes in appetite. She has been active at home, performing all her housework independently until the recent diagnosis. She lives alone and has been considering moving to a retirement center but is concerned about the requirement for independence.  No history of smoking or alcohol use. She is currently using a walker for mobility and has started walking short distances. She wants to remain comfortable and avoid suffering.  Medical History: Past Medical History:  Diagnosis Date   Allergy    Anxiety    Arthritis    Breast cancer of upper-outer quadrant of left female breast (HCC) 11/24/2015   Cataract    Complication of anesthesia    Depression    Diverticulosis    Gait abnormality    Gastroesophageal reflux disease    Glaucoma    Hyperlipidemia    Lipid profile in 05/2010:149, 86, 48, 84   Lung nodule    Right lower lobe; stable in 2010   Paroxysmal atrial fibrillation (HCC)    Rate related right bundle branch block; normal EF on echo in 2006; normal stress nuclear-2006; mild RV hypokinesis on MRI;  RA and RV enlargement on CT in 9/06; Anticoagulation->discontinued in 2009   Personal history of kidney stones    PONV (postoperative nausea and vomiting)    Nausea    Surgical history: Past Surgical History:  Procedure Laterality Date   ABDOMINAL HYSTERECTOMY  2202   ANKLE SURGERY Right 12/23/2001   Fracture- rod   APPENDECTOMY     as a teenager   BREAST LUMPECTOMY Left 2016   invasive ductal ca   BREAST LUMPECTOMY WITH RADIOACTIVE SEED LOCALIZATION Left 12/13/2015   Procedure: BREAST LUMPECTOMY WITH RADIOACTIVE SEED LOCALIZATION;  Surgeon: Morene Olives, MD;  Location: [facility] OR;  Service: General;  Laterality: Left;   CATARACT EXTRACTION W/ INTRAOCULAR  LENS IMPLANT Right 12/23/2012   COLONOSCOPY N/A 06/25/2024   Procedure: COLONOSCOPY;  Surgeon: Shaaron Lamar HERO, MD;  Location: [facility] SU[facility];  Service: Endoscopy;  Laterality: N/A;   COLONOSCOPY W/ POLYPECTOMY     COLOSTOMY REVISION Right 06/28/2024   Procedure: RIGHT HEMI-COLECTOMY WITH  LIVER BIOPSY;  Surgeon: Kallie Manuelita BROCKS, MD;  Location: [facility] ORS;  Service: General;  Laterality: Right;   ESOPHAGOGASTRODUODENOSCOPY     ESOPHAGOGASTRODUODENOSCOPY N/A 06/25/2024   Procedure: EGD (ESOPHAGOGASTRODUODENOSCOPY);  Surgeon: Shaaron Lamar HERO, MD;  Location: [facility];  Service: Endoscopy;  Laterality: N/A;   GLAUCOMA SURGERY Right 12/23/2012   HERNIA REPAIR Right    1990's   ORIF ANKLE FRACTURE  12/23/2001   Right   TONSILLECTOMY     age 62   Varicose veins Right 2207     Allergies:  is allergic to brimonidine, sulfa drugs cross reactors, codeine, morphine, and sulfamethoxazole.  Medications:  Current Outpatient Medications  Medication Sig Dispense Refill   acetaminophen  (TYLENOL ) 500 MG tablet Take 1,000 mg by mouth as needed for mild pain (pain score 1-3), moderate pain (pain score 4-6), fever or headache.     ALPRAZolam  (XANAX ) 0.25 MG tablet Take 1 tablet (0.25 mg total) by mouth daily as needed for anxiety. 5 tablet 0   anastrozole  (ARIMIDEX ) 1 MG tablet Take 1 tablet (1 mg total) by  mouth daily. 90 tablet 3   Cholecalciferol (VITAMIN D ) 2000 UNITS tablet Take 2,000 Units by mouth daily.     Cyanocobalamin (B-12) 1000 MCG TABS Take 1 tablet by mouth daily.     dorzolamide -timolol  (COSOPT ) 22.3-6.8 MG/ML ophthalmic solution Place 1 drop into both eyes 2 (two) times daily.     ezetimibe -simvastatin  (VYTORIN ) 10-20 MG tablet TAKE ONE TABLET BY MOUTH EVERY DAY 90 tablet 3   feeding supplement (ENSURE SURGERY) LIQD Take 237 mLs by mouth 2 (two) times daily between meals.     latanoprost  (XALATAN ) 0.005 % ophthalmic solution Place 1 drop into both eyes at bedtime.     magnesium  oxide  (MAG-OX) 400 (240 Mg) MG tablet Take 1 tablet (400 mg total) by mouth daily. X 4 days     metoprolol  succinate (TOPROL -XL) 25 MG 24 hr tablet TAKE (1/2) TABLET BY MOUTH TWICE DAILY (Patient taking differently: Take 12.5 mg by mouth in the morning and at bedtime.) 90 tablet 2   pantoprazole  (PROTONIX ) 40 MG tablet Take 1 tablet (40 mg total) by mouth daily.     pilocarpine  (PILOCAR) 1 % ophthalmic solution Place 1 drop into the right eye 4 times daily.     rivaroxaban  (XARELTO ) 20 MG TABS tablet Take 20 mg by mouth daily with supper.     sodium bicarbonate  650 MG tablet Take 1 tablet (650 mg total) by mouth 2 (two) times daily.     traMADol  (ULTRAM ) 50 MG tablet Take 1 tablet (50 mg total) by mouth every 6 (six) hours as needed for moderate pain (pain score 4-6). 8 tablet 0   No current facility-administered medications for this visit.    Review of Systems: Constitutional: Denies fevers, chills or abnormal night sweats Eyes: Denies blurriness of vision, double vision or watery eyes Ears, nose, mouth, throat, and face: Denies mucositis or sore throat Respiratory: Denies cough, dyspnea or wheezes Cardiovascular: Denies palpitation, chest discomfort or lower extremity swelling Gastrointestinal:  Denies nausea, heartburn or change in bowel habits Skin: Denies abnormal skin rashes Lymphatics: Denies new lymphadenopathy or easy bruising Neurological:Denies numbness, tingling or new weaknesses Behavioral/Psych: Mood is stable, no new changes  All other systems were reviewed with the patient and are negative.  Physical Examination: ECOG PERFORMANCE STATUS: 3 - Symptomatic, >50% confined to bed  Vitals:   07/15/24 1116  BP: 111/75  Pulse: 89  Resp: 18  Temp: 98.2 F (36.8 C)  SpO2: 100%   There were no vitals filed for this visit.  GENERAL:alert, no distress and comfortable, frail elderly female SKIN: skin color, texture, turgor are normal, no rashes or significant lesions LYMPH:  no  palpable lymphadenopathy in the cervical, axillary or inguinal LUNGS: clear to auscultation and percussion with normal breathing effort HEART: regular rate & rhythm and no murmurs and no lower extremity edema ABDOMEN:abdomen soft, non-tender and normal bowel sounds Musculoskeletal:no cyanosis of digits and no clubbing  PSYCH: alert & oriented x 3 with fluent speech NEURO: no focal motor/sensory deficits   Laboratory Data: I have reviewed the data as listed Lab Results  Component Value Date   WBC 9.5 07/15/2024   HGB 9.3 (L) 07/15/2024   HCT 31.7 (L) 07/15/2024   MCV 79.3 (L) 07/15/2024   PLT 453 (H) 07/15/2024   Recent Labs    06/21/24 1450 06/22/24 1410 07/02/24 0501 07/09/24 0455 07/12/24 0800  NA 142   < > 139 139 139  K 3.9   < > 3.4* 3.4* 3.6  CL  110*   < > 107 109 109  CO2 19*   < > 24 24 23   GLUCOSE 95   < > 97 87 86  BUN 19   < > 27* 22 17  CREATININE 0.67   < > 0.67 0.69 0.72  CALCIUM 9.7   < > 9.1 8.9 9.1  GFRNONAA  --    < > >60 >60 >60  PROT 6.0  --   --   --   --   ALBUMIN 3.6  --   --   --   --   AST 19  --   --   --   --   ALT 10  --   --   --   --   ALKPHOS 113  --   --   --   --   BILITOT 0.3  --   --   --   --    < > = values in this interval not displayed.    Latest Reference Range & Units 07/09/24 04:55  Iron 28 - 170 ug/dL 20 (L)  UIBC ug/dL 781  TIBC 749 - 549 ug/dL 761 (L)  Saturation Ratios 10.4 - 31.8 % 8 (L)  (L): Data is abnormally low  Radiographic Studies: I have personally reviewed the radiological images as listed and agreed with the findings in the report.  CT ABDOMEN PELVIS W CONTRAST CLINICAL DATA:  Colorectal carcinoma. Mass on colonoscopy. Anemia. Staging exam. * Tracking Code: BO *  EXAM: CT ABDOMEN AND PELVIS WITH CONTRAST  TECHNIQUE: Multidetector CT imaging of the abdomen and pelvis was performed using the standard protocol following bolus administration of intravenous contrast.  RADIATION DOSE REDUCTION: This  exam was performed according to the departmental dose-optimization program which includes automated exposure control, adjustment of the mA and/or kV according to patient size and/or use of iterative reconstruction technique.  CONTRAST:  OMNIPAQUE  IOHEXOL  300 MG/ML  SOLN  COMPARISON:  Contrast CT 01/25/2011  FINDINGS: Lower chest: Lung bases are clear.  Hepatobiliary: Hypoenhancing mass in the lateral aspect of the LEFT hepatic lobe measures 3.9 x 3.1 cm. This lesion was not present on contrast CT from 01/25/2011. Small gallstones in the gallbladder. No biliary duct dilatation  Pancreas: Pancreas is normal. No ductal dilatation. No pancreatic inflammation.  Spleen: Normal spleen  Adrenals/urinary tract: Adrenal glands and kidneys are normal. The ureters and bladder normal.  Stomach/Bowel: Stomach, duodenum and small bowel are normal. Circumferential thickening in the cecum (image 46/2). No discrete measurable mass. Appendix not identified.  Ascending, transverse descending colon. Multiple diverticula through the sigmoid colon. No obstructing mass lesion identified.  No lymphadenopathy within the sigmoid mesocolon.  There is enlarged lymph node in ileocecal mesentery just ventral to the RIGHT psoas muscle measuring 19 mm x 10 mm on image 46/2.  Vascular/Lymphatic: Abdominal aorta is normal caliber. No periportal or retroperitoneal adenopathy. No pelvic adenopathy.  Reproductive: Post hysterectomy.  Adnexa unremarkable  Other: No free fluid.  Musculoskeletal: No aggressive osseous lesion.  IMPRESSION: 1. Circumferential thickening in the cecum concerning for primary colon adenocarcinoma given the endoscopy findings. 2. Enlarged lymph node in the ileocecal mesentery concerning for local nodal metastasis. 3. Hypoenhancing mass in the LEFT hepatic lobe concerning for hepatic metastasis. 4. Cholelithiasis without evidence cholecystitis. 5. Sigmoid diverticulosis  without evidence diverticulitis.  Electronically Signed   By: Jackquline Boxer M.D.   On: 06/25/2024 15:46  DG Chest Port 1 View CLINICAL DATA:  Colon mass.  History  of breast cancer.  EXAM: PORTABLE CHEST 1 VIEW  COMPARISON:  10/01/2021.  FINDINGS: The heart size and mediastinal contours are within normal limits. There is atherosclerotic calcification aorta. Mild atelectasis is noted at the lung bases bilaterally. No effusion or pneumothorax is seen. Surgical clips are noted in the left breast. No acute osseous abnormality.  IMPRESSION: Mild atelectasis at the lung bases.  Electronically Signed   By: Leita Birmingham M.D.   On: 06/25/2024 15:40    ASSESSMENT & PLAN:  Patient is a 89 y.o. female presenting for newly diagnosed colon cancer  Assessment & Plan Colon cancer metastasized to liver [facility]) Patient with colon cancer metastatic to liver s/p hemicolectomy.  - Discussed in detail the diagnosis and prognosis of metastatic colon cancer.  Treatment would include adjuvant chemotherapy.  With patient's age and comorbidities would not recommend chemotherapy for her.  Patient is also very reluctant to get chemotherapy. - For liver metastasis, surgery is the best option.  But considering patient's age and comorbidities recommended radiation if she is willing to undergo further treatment. - Will obtain CT chest to rule out any lung metastasis - Also discussed the role of palliative care and metastatic carcinoma if patient is willing to not get any further treatment. -Patient reported that she would like to think about her treatment options and would discuss at the next visit.  Return to clinic 1 week after CT scan to discuss results and further management Iron deficiency anemia due to chronic blood loss The most likely cause of her anemia is due to chronic blood loss from colon cancer.  Lab Results  Component Value Date   IRON 19 (L) 07/15/2024   TIBC 306 07/15/2024    FERRITIN 45 07/15/2024    TSAT: 8  -Will repeat labs today to obtain new baseline -We discussed some of the risks, benefits, and alternatives of intravenous iron infusions. The patient is symptomatic from anemia and the iron level is critically low. She tolerated oral iron supplement poorly and desires to achieve higher levels of iron faster for adequate hematopoesis. Some of the side-effects to be expected including risks of infusion reactions, phlebitis, headaches, nausea and fatigue.  The patient is willing to proceed. Patient education material was dispensed. Goal is to keep ferritin level greater than 50 and resolution of anemia -Continue oral iron every other day.  Use MiraLAX  for constipation  Return to clinic in 2 months with labs to assess response to IV iron  Malignant neoplasm of upper-outer quadrant of left breast in female, estrogen receptor positive (HCC) Patient has a past medical history of recurrent endocrine receptor positive breast cancer.  Did not tolerate tamoxifen . Previously seen Dr. Gudena and is currently on anastrozole  Last mammogram in 10/2023: No evidence of disease  - Continue anastrozole  -Repeat mammogram in 10/2024 -Continue calcium and vitamin D  supplementation Goals of care, counseling/discussion Discussed goals of care with the patient considering patient has metastatic stage IV carcinoma.  - Patient at this time desires comfort, avoid aggressive treatment and prefers quality of life.  Wishes to avoid life-prolonging measures - DNR/DNI as per patient's request - Discussed potential transition to palliative care if patient does not want any further treatment for her colon cancer.    Orders Placed This Encounter  Procedures   CT Chest W Contrast    Standing Status:   Future    Expected Date:   07/16/2024    Expiration Date:   07/15/2025    If indicated for the ordered  procedure, I authorize the administration of contrast media per Radiology protocol:   Yes     Does the patient have a contrast media/X-ray dye allergy?:   No    Preferred imaging location?:   [facility]    Release to patient:   Immediate [1]   CBC with Differential    Standing Status:   Future    Number of Occurrences:   1    Expected Date:   07/15/2024    Expiration Date:   10/13/2024   Iron and TIBC ([facility] ONLY)    Standing Status:   Future    Number of Occurrences:   1    Expected Date:   07/15/2024    Expiration Date:   10/13/2024   Ferritin    Standing Status:   Future    Number of Occurrences:   1    Expected Date:   07/15/2024    Expiration Date:   10/13/2024   CEA    Standing Status:   Future    Number of Occurrences:   1    Expected Date:   07/15/2024    Expiration Date:   10/13/2024    The total time spent in the appointment was 60 minutes encounter with patients including review of chart and various tests results, discussions about plan of care and coordination of care plan   All questions were answered. The patient knows to call the clinic with any problems, questions or concerns. No barriers to learning was detected.  Mickiel Dry, MD 7/24/20255:29 PM

## 2024-07-16 ENCOUNTER — Encounter: Payer: Self-pay | Admitting: Adult Health

## 2024-07-16 ENCOUNTER — Non-Acute Institutional Stay (SKILLED_NURSING_FACILITY): Admitting: Adult Health

## 2024-07-16 ENCOUNTER — Encounter (HOSPITAL_COMMUNITY): Payer: Self-pay

## 2024-07-16 DIAGNOSIS — Z170 Estrogen receptor positive status [ER+]: Secondary | ICD-10-CM | POA: Diagnosis not present

## 2024-07-16 DIAGNOSIS — C50412 Malignant neoplasm of upper-outer quadrant of left female breast: Secondary | ICD-10-CM

## 2024-07-16 DIAGNOSIS — C189 Malignant neoplasm of colon, unspecified: Secondary | ICD-10-CM | POA: Diagnosis not present

## 2024-07-16 DIAGNOSIS — C787 Secondary malignant neoplasm of liver and intrahepatic bile duct: Secondary | ICD-10-CM | POA: Diagnosis not present

## 2024-07-16 DIAGNOSIS — I480 Paroxysmal atrial fibrillation: Secondary | ICD-10-CM | POA: Diagnosis not present

## 2024-07-16 LAB — CEA: CEA: 2.8 ng/mL (ref 0.0–4.7)

## 2024-07-16 NOTE — Progress Notes (Signed)
 Location:  [facility] Room Number: 130 Place of Service:  SNF (31)   CODE STATUS: dnr   Allergies  Allergen Reactions   Brimonidine Other (See Comments)    Crusting - difficulty opening eyes   Sulfa Drugs Cross Reactors Other (See Comments)    Unknown    Codeine Nausea Only   Morphine Nausea Only   Sulfamethoxazole Nausea Only    Chief Complaint  Patient presents with   Acute Visit    Care plan meeting     HPI:  We have come together for her care plan meeting. Family present. BIMS 15/15 mood 2/30: nervous at times. She requires moderate to max assist with her adl care. She is frequently incontinent of bladder and bowel. Therapy: ambulate 150 feet with walker at supervision; independent upper; min assist lower body; transfer with walker; 4 steps bilateral rails with min assist; brp: independent. BCAT 40/50. Regular diet appetite 26-100% feeds self; weight is 153.8 pounds.  Activities: social. She will continue to be followed for her chronic illnesses including:  Colon cancer with liver metastasis  Paroxysmal atrial fibrillation   Malignant neoplasm of upper outer quadrant of left breast in female estrogen receptor positive  Past Medical History:  Diagnosis Date   Allergy    Anxiety    Arthritis    Breast cancer of upper-outer quadrant of left female breast (HCC) 11/24/2015   Cataract    Complication of anesthesia    Depression    Diverticulosis    Gait abnormality    Gastroesophageal reflux disease    Glaucoma    Hyperlipidemia    Lipid profile in 05/2010:149, 86, 48, 84   Lung nodule    Right lower lobe; stable in 2010   Paroxysmal atrial fibrillation (HCC)    Rate related right bundle branch block; normal EF on echo in 2006; normal stress nuclear-2006; mild RV hypokinesis on MRI;  RA and RV enlargement on CT in 9/06; Anticoagulation->discontinued in 2009   Personal history of kidney stones    PONV (postoperative nausea and vomiting)    Nausea     Past Surgical History:  Procedure Laterality Date   ABDOMINAL HYSTERECTOMY  2202   ANKLE SURGERY Right 12/23/2001   Fracture- rod   APPENDECTOMY     as a teenager   BREAST LUMPECTOMY Left 2016   invasive ductal ca   BREAST LUMPECTOMY WITH RADIOACTIVE SEED LOCALIZATION Left 12/13/2015   Procedure: BREAST LUMPECTOMY WITH RADIOACTIVE SEED LOCALIZATION;  Surgeon: Morene Olives, MD;  Location: [facility] OR;  Service: General;  Laterality: Left;   CATARACT EXTRACTION W/ INTRAOCULAR LENS IMPLANT Right 12/23/2012   COLONOSCOPY N/A 06/25/2024   Procedure: COLONOSCOPY;  Surgeon: Shaaron Lamar HERO, MD;  Location: [facility];  Service: Endoscopy;  Laterality: N/A;   COLONOSCOPY W/ POLYPECTOMY     COLOSTOMY REVISION Right 06/28/2024   Procedure: RIGHT HEMI-COLECTOMY WITH  LIVER BIOPSY;  Surgeon: Kallie Manuelita BROCKS, MD;  Location: [facility] ORS;  Service: General;  Laterality: Right;   ESOPHAGOGASTRODUODENOSCOPY     ESOPHAGOGASTRODUODENOSCOPY N/A 06/25/2024   Procedure: EGD (ESOPHAGOGASTRODUODENOSCOPY);  Surgeon: Shaaron Lamar HERO, MD;  Location: [facility];  Service: Endoscopy;  Laterality: N/A;   GLAUCOMA SURGERY Right 12/23/2012   HERNIA REPAIR Right    1990's   ORIF ANKLE FRACTURE  12/23/2001   Right   TONSILLECTOMY     age 89   Varicose veins Right 2207    Social History   Socioeconomic History   Marital  status: Widowed    Spouse name: Not on file   Number of children: 0   Years of education: 15   Highest education level: Not on file  Occupational History   Occupation: Retired  Tobacco Use   Smoking status: Never   Smokeless tobacco: Never  Vaping Use   Vaping status: Never Used  Substance and Sexual Activity   Alcohol use: No    Alcohol/week: 0.0 standard drinks of alcohol   Drug use: No   Sexual activity: Not Currently  Other Topics Concern   Not on file  Social History Narrative   Lives at home with husband.   Right-handed.   No caffeine use.   Social Drivers of Manufacturing engineer Strain: Not on file  Food Insecurity: No Food Insecurity (07/15/2024)   Hunger Vital Sign    Worried About Running Out of Food in the Last Year: Never true    Ran Out of Food in the Last Year: Never true  Transportation Needs: No Transportation Needs (07/15/2024)   PRAPARE - Administrator, Civil Service (Medical): No    Lack of Transportation (Non-Medical): No  Physical Activity: Not on file  Stress: Not on file  Social Connections: Socially Isolated (06/22/2024)   Social Connection and Isolation Panel    Frequency of Communication with Friends and Family: More than three times a week    Frequency of Social Gatherings with Friends and Family: Twice a week    Attends Religious Services: Never    Database administrator or Organizations: No    Attends Banker Meetings: Never    Marital Status: Widowed  Intimate Partner Violence: Not At Risk (07/15/2024)   Humiliation, Afraid, Rape, and Kick questionnaire    Fear of Current or Ex-Partner: No    Emotionally Abused: No    Physically Abused: No    Sexually Abused: No   Family History  Problem Relation Age of Onset   Healthy Mother    Cerebral aneurysm Father    Lung cancer Sister       VITAL SIGNS BP (!) 102/56   Pulse 79   Temp 98.2 F (36.8 C)   Resp 20   Ht 5' 6 (1.676 m)   Wt 153 lb 12.8 oz (69.8 kg)   SpO2 94%   BMI 24.82 kg/m   Outpatient Encounter Medications as of 07/16/2024  Medication Sig Note   acetaminophen  (TYLENOL ) 500 MG tablet Take 1,000 mg by mouth as needed for mild pain (pain score 1-3), moderate pain (pain score 4-6), fever or headache.    ALPRAZolam  (XANAX ) 0.25 MG tablet Take 1 tablet (0.25 mg total) by mouth daily as needed for anxiety.    anastrozole  (ARIMIDEX ) 1 MG tablet Take 1 tablet (1 mg total) by mouth daily.    Cholecalciferol (VITAMIN D ) 2000 UNITS tablet Take 2,000 Units by mouth daily.    Cyanocobalamin (B-12) 1000 MCG TABS Take 1 tablet by  mouth daily.    dorzolamide -timolol  (COSOPT ) 22.3-6.8 MG/ML ophthalmic solution Place 1 drop into both eyes 2 (two) times daily.    ezetimibe -simvastatin  (VYTORIN ) 10-20 MG tablet TAKE ONE TABLET BY MOUTH EVERY DAY    feeding supplement (ENSURE SURGERY) LIQD Take 237 mLs by mouth 2 (two) times daily between meals.    latanoprost  (XALATAN ) 0.005 % ophthalmic solution Place 1 drop into both eyes at bedtime.    magnesium  oxide (MAG-OX) 400 (240 Mg) MG tablet Take 1 tablet (400 mg  total) by mouth daily. X 4 days    metoprolol  succinate (TOPROL -XL) 25 MG 24 hr tablet TAKE (1/2) TABLET BY MOUTH TWICE DAILY    pantoprazole  (PROTONIX ) 40 MG tablet Take 1 tablet (40 mg total) by mouth daily.    pilocarpine  (PILOCAR) 1 % ophthalmic solution Place 1 drop into the right eye 4 times daily. 06/22/2024: Comes from [facility] [address], [place]   rivaroxaban  (XARELTO ) 20 MG TABS tablet Take 20 mg by mouth daily with supper.    sodium bicarbonate  650 MG tablet Take 1 tablet (650 mg total) by mouth 2 (two) times daily.    traMADol  (ULTRAM ) 50 MG tablet Take 1 tablet (50 mg total) by mouth every 6 (six) hours as needed for moderate pain (pain score 4-6).    No facility-administered encounter medications on file as of 07/16/2024.     SIGNIFICANT DIAGNOSTIC EXAMS  LABS 06-22-24: wbc 4.9; hgb 5.6; hct 20.5; mcv 72.2 plt 247; glucose 99; bun 21; creat 0.74;k+ 3.6; na++ 136; ca 9.5; gfr >60 06-25-24: wbc 9.1; hgb 10.5; hct 34.7; mcv 76.1 plt 264; glucose 97; bun 10; creat 0.60; k+ 3.3; na++ 136; ca 9.2 gfr>60  07-02-24: wbc 8.2; hgb 8.9; hct 30.4; mcv 77.2; plt 263; glucose 97 ;bun 27; creat 0.67; k+ 3.4; na++ 139; ca 9.1; gfr >60 mag 1.7; phos 2.9   Review of Systems  Constitutional:  Negative for malaise/fatigue.  Respiratory:  Negative for cough and shortness of breath.   Cardiovascular:  Negative for chest pain, palpitations and leg swelling.  Gastrointestinal:  Negative for abdominal pain, constipation and  heartburn.  Musculoskeletal:  Negative for back pain, joint pain and myalgias.  Skin: Negative.   Neurological:  Negative for dizziness.  Psychiatric/Behavioral:  The patient is not nervous/anxious.    Physical Exam Constitutional:      General: She is not in acute distress.    Appearance: She is well-developed. She is not diaphoretic.  Neck:     Thyroid : No thyromegaly.  Cardiovascular:     Rate and Rhythm: Normal rate and regular rhythm.     Heart sounds: Normal heart sounds.  Pulmonary:     Effort: Pulmonary effort is normal. No respiratory distress.     Breath sounds: Normal breath sounds.  Abdominal:     General: Bowel sounds are normal. There is no distension.     Palpations: Abdomen is soft.     Tenderness: There is no abdominal tenderness.  Musculoskeletal:        General: Normal range of motion.     Cervical back: Neck supple.     Right lower leg: No edema.     Left lower leg: No edema.  Lymphadenopathy:     Cervical: No cervical adenopathy.  Skin:    General: Skin is warm and dry.  Neurological:     Mental Status: She is alert and oriented to person, place, and time.  Psychiatric:        Mood and Affect: Mood normal.     ASSESSMENT/ PLAN:  TODAY  Colon cancer with liver metastasis  Paroxysmal atrial fibrillation Malignant neoplasm of upper outer quadrant of left breast in female estrogen receptor positive  Will continue current medications Will continue therapy as directed Will continue to monitor her status Goals of care: return to home vs assisted living.   Time spent with patient: 40 minutes: Therapy; medications; goals of care.     Barnie Seip NP [facility]   call [phone]

## 2024-07-17 ENCOUNTER — Encounter (HOSPITAL_COMMUNITY): Payer: Self-pay | Admitting: Internal Medicine

## 2024-07-17 DIAGNOSIS — R262 Difficulty in walking, not elsewhere classified: Secondary | ICD-10-CM | POA: Diagnosis not present

## 2024-07-17 DIAGNOSIS — M6281 Muscle weakness (generalized): Secondary | ICD-10-CM | POA: Diagnosis not present

## 2024-07-17 DIAGNOSIS — Z48815 Encounter for surgical aftercare following surgery on the digestive system: Secondary | ICD-10-CM | POA: Diagnosis not present

## 2024-07-17 DIAGNOSIS — C787 Secondary malignant neoplasm of liver and intrahepatic bile duct: Secondary | ICD-10-CM | POA: Diagnosis not present

## 2024-07-17 NOTE — Transfer of Care (Signed)
 Immediate Anesthesia Transfer of Care Note  Patient: Hailey Graham  Procedure(s) Performed: COLONOSCOPY EGD (ESOPHAGOGASTRODUODENOSCOPY)  Patient Location: PACU  Anesthesia Type:General  Level of Consciousness: awake and alert   Airway & Oxygen Therapy: Patient Spontanous Breathing  Post-op Assessment: Report given to RN and Post -op Vital signs reviewed and stable  Post vital signs: Reviewed and stable  Last Vitals:  Vitals Value Taken Time  BP 102/56 07/16/24 10:29  Temp 36.8 C 07/16/24 10:29  Pulse 79 07/16/24 10:29  Resp 20 07/16/24 10:29  SpO2 94 % 07/16/24 10:29    Last Pain:  Vitals:   07/02/24 0715  TempSrc:   PainSc: Asleep      Patients Stated Pain Goal: 4 (06/28/24 1530)  Complications: No notable events documented.

## 2024-07-17 NOTE — Anesthesia Postprocedure Evaluation (Signed)
 Anesthesia Post Note  Patient: Hailey Graham  Procedure(s) Performed: COLONOSCOPY EGD (ESOPHAGOGASTRODUODENOSCOPY)  Patient location during evaluation: PACU Anesthesia Type: MAC Level of consciousness: awake and alert Pain management: pain level controlled Vital Signs Assessment: post-procedure vital signs reviewed and stable Respiratory status: spontaneous breathing, nonlabored ventilation, respiratory function stable and patient connected to nasal cannula oxygen Cardiovascular status: blood pressure returned to baseline and stable Postop Assessment: no apparent nausea or vomiting Anesthetic complications: no   No notable events documented.   Last Vitals:  Vitals:   07/01/24 2252 07/02/24 0345  BP: 114/64 95/65  Pulse: 84 (!) 58  Resp: 18 18  Temp: 37.4 C 36.6 C  SpO2: 93% 95%    Last Pain:  Vitals:   07/02/24 0715  TempSrc:   PainSc: Asleep                 Yvonna JINNY Bosworth

## 2024-07-18 ENCOUNTER — Encounter: Payer: Self-pay | Admitting: Oncology

## 2024-07-18 DIAGNOSIS — Z7189 Other specified counseling: Secondary | ICD-10-CM | POA: Insufficient documentation

## 2024-07-18 NOTE — Assessment & Plan Note (Signed)
 Patient with colon cancer metastatic to liver s/p hemicolectomy.  - Discussed in detail the diagnosis and prognosis of metastatic colon cancer.  Treatment would include adjuvant chemotherapy.  With patient's age and comorbidities would not recommend chemotherapy for her.  Patient is also very reluctant to get chemotherapy. - For liver metastasis, surgery is the best option.  But considering patient's age and comorbidities recommended radiation if she is willing to undergo further treatment. - Will obtain CT chest to rule out any lung metastasis - Also discussed the role of palliative care and metastatic carcinoma if patient is willing to not get any further treatment. -Patient reported that she would like to think about her treatment options and would discuss at the next visit.  Return to clinic 1 week after CT scan to discuss results and further management

## 2024-07-18 NOTE — Assessment & Plan Note (Addendum)
 The most likely cause of her anemia is due to chronic blood loss from colon cancer.  Lab Results  Component Value Date   IRON 19 (L) 07/15/2024   TIBC 306 07/15/2024   FERRITIN 45 07/15/2024    TSAT: 8  -Will repeat labs today to obtain new baseline -We discussed some of the risks, benefits, and alternatives of intravenous iron infusions. The patient is symptomatic from anemia and the iron level is critically low. She tolerated oral iron supplement poorly and desires to achieve higher levels of iron faster for adequate hematopoesis. Some of the side-effects to be expected including risks of infusion reactions, phlebitis, headaches, nausea and fatigue.  The patient is willing to proceed. Patient education material was dispensed. Goal is to keep ferritin level greater than 50 and resolution of anemia -Continue oral iron every other day.  Use MiraLAX  for constipation  Return to clinic in 2 months with labs to assess response to IV iron

## 2024-07-18 NOTE — Assessment & Plan Note (Addendum)
 Patient has a past medical history of recurrent endocrine receptor positive breast cancer.  Did not tolerate tamoxifen . Previously seen Dr. Gudena and is currently on anastrozole  Last mammogram in 10/2023: No evidence of disease  - Continue anastrozole  -Repeat mammogram in 10/2024 -Continue calcium and vitamin D  supplementation

## 2024-07-18 NOTE — Assessment & Plan Note (Addendum)
 Discussed goals of care with the patient considering patient has metastatic stage IV carcinoma.  - Patient at this time desires comfort, avoid aggressive treatment and prefers quality of life.  Wishes to avoid life-prolonging measures - DNR/DNI as per patient's request - Discussed potential transition to palliative care if patient does not want any further treatment for her colon cancer.

## 2024-07-19 ENCOUNTER — Encounter: Payer: Self-pay | Admitting: Oncology

## 2024-07-19 DIAGNOSIS — R262 Difficulty in walking, not elsewhere classified: Secondary | ICD-10-CM | POA: Diagnosis not present

## 2024-07-19 DIAGNOSIS — C787 Secondary malignant neoplasm of liver and intrahepatic bile duct: Secondary | ICD-10-CM | POA: Diagnosis not present

## 2024-07-19 DIAGNOSIS — M6281 Muscle weakness (generalized): Secondary | ICD-10-CM | POA: Diagnosis not present

## 2024-07-19 DIAGNOSIS — Z48815 Encounter for surgical aftercare following surgery on the digestive system: Secondary | ICD-10-CM | POA: Diagnosis not present

## 2024-07-20 DIAGNOSIS — R262 Difficulty in walking, not elsewhere classified: Secondary | ICD-10-CM | POA: Diagnosis not present

## 2024-07-20 DIAGNOSIS — M6281 Muscle weakness (generalized): Secondary | ICD-10-CM | POA: Diagnosis not present

## 2024-07-20 DIAGNOSIS — Z48815 Encounter for surgical aftercare following surgery on the digestive system: Secondary | ICD-10-CM | POA: Diagnosis not present

## 2024-07-20 DIAGNOSIS — C787 Secondary malignant neoplasm of liver and intrahepatic bile duct: Secondary | ICD-10-CM | POA: Diagnosis not present

## 2024-07-21 ENCOUNTER — Inpatient Hospital Stay

## 2024-07-21 VITALS — BP 96/57 | HR 77 | Temp 97.8°F | Resp 19

## 2024-07-21 DIAGNOSIS — Z853 Personal history of malignant neoplasm of breast: Secondary | ICD-10-CM | POA: Diagnosis not present

## 2024-07-21 DIAGNOSIS — M6281 Muscle weakness (generalized): Secondary | ICD-10-CM | POA: Diagnosis not present

## 2024-07-21 DIAGNOSIS — D500 Iron deficiency anemia secondary to blood loss (chronic): Secondary | ICD-10-CM

## 2024-07-21 DIAGNOSIS — C787 Secondary malignant neoplasm of liver and intrahepatic bile duct: Secondary | ICD-10-CM | POA: Diagnosis not present

## 2024-07-21 DIAGNOSIS — R262 Difficulty in walking, not elsewhere classified: Secondary | ICD-10-CM | POA: Diagnosis not present

## 2024-07-21 DIAGNOSIS — Z48815 Encounter for surgical aftercare following surgery on the digestive system: Secondary | ICD-10-CM | POA: Diagnosis not present

## 2024-07-21 DIAGNOSIS — C182 Malignant neoplasm of ascending colon: Secondary | ICD-10-CM | POA: Diagnosis not present

## 2024-07-21 MED ORDER — ACETAMINOPHEN 325 MG PO TABS
650.0000 mg | ORAL_TABLET | Freq: Once | ORAL | Status: AC
  Administered 2024-07-21: 650 mg via ORAL
  Filled 2024-07-21: qty 2

## 2024-07-21 MED ORDER — CETIRIZINE HCL 10 MG/ML IV SOLN
5.0000 mg | Freq: Once | INTRAVENOUS | Status: AC
  Administered 2024-07-21: 5 mg via INTRAVENOUS
  Filled 2024-07-21: qty 1

## 2024-07-21 MED ORDER — SODIUM CHLORIDE 0.9 % IV SOLN: INTRAVENOUS | Status: DC

## 2024-07-21 MED ORDER — SODIUM CHLORIDE 0.9 % IV SOLN
510.0000 mg | Freq: Once | INTRAVENOUS | Status: AC
  Administered 2024-07-21: 510 mg via INTRAVENOUS
  Filled 2024-07-21: qty 510

## 2024-07-21 NOTE — Progress Notes (Signed)
 Patient tolerated iron infusion with no complaints voiced.  Peripheral IV site clean and dry with good blood return noted before and after infusion.  Band aid applied. Pt observed for 30 minutes post iron without complications.  VSS with discharge and left in satisfactory condition with no s/s of distress noted. All follow ups as scheduled.   Hailey Graham

## 2024-07-21 NOTE — Patient Instructions (Signed)

## 2024-07-21 NOTE — Progress Notes (Signed)
Patient presents today for iron infusion.  Patient is in satisfactory condition with no new complaints voiced.  Vital signs are stable.  We will proceed with infusion per provider orders.    Peripheral IV started with good blood return 

## 2024-07-22 ENCOUNTER — Encounter: Admitting: Surgery

## 2024-07-22 DIAGNOSIS — C787 Secondary malignant neoplasm of liver and intrahepatic bile duct: Secondary | ICD-10-CM | POA: Diagnosis not present

## 2024-07-22 DIAGNOSIS — Z48815 Encounter for surgical aftercare following surgery on the digestive system: Secondary | ICD-10-CM | POA: Diagnosis not present

## 2024-07-22 DIAGNOSIS — M6281 Muscle weakness (generalized): Secondary | ICD-10-CM | POA: Diagnosis not present

## 2024-07-22 DIAGNOSIS — R262 Difficulty in walking, not elsewhere classified: Secondary | ICD-10-CM | POA: Diagnosis not present

## 2024-07-23 ENCOUNTER — Ambulatory Visit (HOSPITAL_COMMUNITY)
Admission: RE | Admit: 2024-07-23 | Discharge: 2024-07-23 | Disposition: A | Discharge status: Home / Self Care | Source: Ambulatory Visit | Attending: Oncology | Admitting: Oncology

## 2024-07-23 DIAGNOSIS — M6281 Muscle weakness (generalized): Secondary | ICD-10-CM | POA: Diagnosis not present

## 2024-07-23 DIAGNOSIS — C189 Malignant neoplasm of colon, unspecified: Secondary | ICD-10-CM | POA: Insufficient documentation

## 2024-07-23 DIAGNOSIS — R160 Hepatomegaly, not elsewhere classified: Secondary | ICD-10-CM | POA: Diagnosis not present

## 2024-07-23 DIAGNOSIS — C787 Secondary malignant neoplasm of liver and intrahepatic bile duct: Secondary | ICD-10-CM | POA: Diagnosis not present

## 2024-07-23 DIAGNOSIS — R262 Difficulty in walking, not elsewhere classified: Secondary | ICD-10-CM | POA: Diagnosis not present

## 2024-07-23 DIAGNOSIS — R918 Other nonspecific abnormal finding of lung field: Secondary | ICD-10-CM | POA: Diagnosis not present

## 2024-07-23 MED ORDER — IOHEXOL 300 MG/ML  SOLN
80.0000 mL | Freq: Once | INTRAMUSCULAR | Status: AC | PRN
  Administered 2024-07-23: 80 mL via INTRAVENOUS

## 2024-07-26 ENCOUNTER — Encounter (HOSPITAL_COMMUNITY): Payer: Self-pay

## 2024-07-28 ENCOUNTER — Inpatient Hospital Stay: Attending: Hematology

## 2024-07-28 VITALS — BP 94/61 | HR 83 | Temp 97.6°F | Resp 16

## 2024-07-28 DIAGNOSIS — C787 Secondary malignant neoplasm of liver and intrahepatic bile duct: Secondary | ICD-10-CM | POA: Diagnosis not present

## 2024-07-28 DIAGNOSIS — Z853 Personal history of malignant neoplasm of breast: Secondary | ICD-10-CM | POA: Diagnosis not present

## 2024-07-28 DIAGNOSIS — C182 Malignant neoplasm of ascending colon: Secondary | ICD-10-CM | POA: Diagnosis not present

## 2024-07-28 DIAGNOSIS — D500 Iron deficiency anemia secondary to blood loss (chronic): Secondary | ICD-10-CM | POA: Insufficient documentation

## 2024-07-28 MED ORDER — SODIUM CHLORIDE 0.9 % IV SOLN
510.0000 mg | Freq: Once | INTRAVENOUS | Status: AC
  Administered 2024-07-28: 510 mg via INTRAVENOUS
  Filled 2024-07-28: qty 510

## 2024-07-28 MED ORDER — CETIRIZINE HCL 10 MG/ML IV SOLN
5.0000 mg | Freq: Once | INTRAVENOUS | Status: AC
  Administered 2024-07-28: 5 mg via INTRAVENOUS
  Filled 2024-07-28: qty 1

## 2024-07-28 MED ORDER — SODIUM CHLORIDE 0.9 % IV SOLN: INTRAVENOUS | Status: DC

## 2024-07-28 MED ORDER — ACETAMINOPHEN 325 MG PO TABS
650.0000 mg | ORAL_TABLET | Freq: Once | ORAL | Status: AC
  Administered 2024-07-28: 650 mg via ORAL
  Filled 2024-07-28: qty 2

## 2024-07-28 NOTE — Progress Notes (Signed)
 Patient tolerated iron infusion with no complaints voiced.  Peripheral IV site clean and dry with good blood return noted before and after infusion.  Band aid applied.  VSS with discharge and left in satisfactory condition with no s/s of distress noted.

## 2024-07-28 NOTE — Patient Instructions (Signed)

## 2024-07-29 ENCOUNTER — Encounter: Payer: Self-pay | Admitting: Adult Health

## 2024-07-29 ENCOUNTER — Non-Acute Institutional Stay (SKILLED_NURSING_FACILITY): Payer: Self-pay | Admitting: Adult Health

## 2024-07-29 ENCOUNTER — Inpatient Hospital Stay: Admitting: Oncology

## 2024-07-29 VITALS — BP 102/61 | HR 66 | Temp 97.5°F | Resp 18 | Wt 152.6 lb

## 2024-07-29 DIAGNOSIS — Z170 Estrogen receptor positive status [ER+]: Secondary | ICD-10-CM

## 2024-07-29 DIAGNOSIS — D500 Iron deficiency anemia secondary to blood loss (chronic): Secondary | ICD-10-CM

## 2024-07-29 DIAGNOSIS — Z7189 Other specified counseling: Secondary | ICD-10-CM

## 2024-07-29 DIAGNOSIS — C50412 Malignant neoplasm of upper-outer quadrant of left female breast: Secondary | ICD-10-CM | POA: Diagnosis not present

## 2024-07-29 DIAGNOSIS — C189 Malignant neoplasm of colon, unspecified: Secondary | ICD-10-CM

## 2024-07-29 DIAGNOSIS — Z853 Personal history of malignant neoplasm of breast: Secondary | ICD-10-CM | POA: Diagnosis not present

## 2024-07-29 DIAGNOSIS — F418 Other specified anxiety disorders: Secondary | ICD-10-CM

## 2024-07-29 DIAGNOSIS — Z66 Do not resuscitate: Secondary | ICD-10-CM

## 2024-07-29 DIAGNOSIS — C787 Secondary malignant neoplasm of liver and intrahepatic bile duct: Secondary | ICD-10-CM

## 2024-07-29 DIAGNOSIS — C182 Malignant neoplasm of ascending colon: Secondary | ICD-10-CM | POA: Diagnosis not present

## 2024-07-29 NOTE — Assessment & Plan Note (Addendum)
 Patient with colon cancer metastatic to liver s/p hemicolectomy Liver metastasis consistent with colon carcinoma-biopsy-proven CT chest with no evidence of metastasis  - Discussed in detail the diagnosis and prognosis of metastatic colon cancer.  Treatment would include adjuvant chemotherapy.  With patient's age and comorbidities would not recommend chemotherapy for her.  Patient decided against chemotherapy. - For liver metastasis, surgery is the best option.  But considering patient's age and comorbidities recommended radiation.  Patient decided against radiation at this time.     After extensive discussion, patient decided to not get further treatment for colon cancer considering her age and comorbidities.  She would like to proceed with hospice and comfort care.  Will refer to hospice at this time. Discussed in detail that the goal of hospice would be to keep her comfortable at the end of life rather than prolonging life at this time.  Patient is in agreement with the plan.

## 2024-07-29 NOTE — Assessment & Plan Note (Addendum)
 The most likely cause of her anemia is due to chronic blood loss from colon cancer. S/P IV iron infusion with no symptomatic improvement  Patient decided to go with hospice and would not require further blood draws here.

## 2024-07-29 NOTE — Progress Notes (Addendum)
 Patient Care Team: Sheryle Carwin, MD as PCP - General (Internal Medicine) Okey Vina GAILS, MD as PCP - Cardiology (Cardiology) Golda Claudis PENNER, MD (Inactive) (Gastroenterology) Odean Potts, MD as Consulting Physician (Hematology and Oncology) Jason Charleston, MD (Inactive) as Consulting Physician (Radiation Oncology) Moses Powell Hummer, NP as Nurse Practitioner (Hematology and Oncology) Davonna Siad, MD as Medical Oncologist (Medical Oncology) Celestia Joesph SQUIBB, RN as Oncology Nurse Navigator (Medical Oncology)  Clinic Day:  07/29/2024  Referring physician: Sheryle Carwin, MD   CHIEF COMPLAINT:  CC: Colon carcinoma   ASSESSMENT & PLAN:   Assessment & Plan: Hailey Graham  is a 89 y.o. female with a history of breast cancer, now presenting with colon cancer  Assessment & Plan Colon cancer metastasized to liver [facility]) Patient with colon cancer metastatic to liver s/p hemicolectomy Liver metastasis consistent with colon carcinoma-biopsy-proven CT chest with no evidence of metastasis  - Discussed in detail the diagnosis and prognosis of metastatic colon cancer.  Treatment would include adjuvant chemotherapy.  With patient's age and comorbidities would not recommend chemotherapy for her.  Patient decided against chemotherapy. - For liver metastasis, surgery is the best option.  But considering patient's age and comorbidities recommended radiation.  Patient decided against radiation at this time.     After extensive discussion, patient decided to not get further treatment for colon cancer considering her age and comorbidities.  She would like to proceed with hospice and comfort care.  Will refer to hospice at this time. Discussed in detail that the goal of hospice would be to keep her comfortable at the end of life rather than prolonging life at this time.  Patient is in agreement with the plan. Malignant neoplasm of upper-outer quadrant of left breast in female, estrogen receptor  positive (HCC) Patient has a past medical history of recurrent endocrine receptor positive breast cancer.  Did not tolerate tamoxifen . Previously seen Dr. Gudena and is currently on anastrozole  Last mammogram in 10/2023: No evidence of disease  - Discussed with the patient that with her decision to go with hospice, she can withhold all the treatments with the goal of prolonging her life and should rather focus on keeping her comfortable.  Can discontinue anastrozole  at this time. Iron deficiency anemia due to chronic blood loss The most likely cause of her anemia is due to chronic blood loss from colon cancer. S/P IV iron infusion with no symptomatic improvement  Patient decided to go with hospice and would not require further blood draws here.  Goals of care, counseling/discussion Discussed goals of care with the patient considering patient has metastatic stage IV carcinoma.  - Patient at this time desires comfort, avoid aggressive treatment and prefers quality of life.  Wishes to avoid life-prolonging measures - DNR/DNI as per patient's request - Patient decided to proceed with hospice care.  Will place a referral today.   The patient understands the plans discussed today and is in agreement with them.  She knows to contact our office if she develops concerns prior to her next appointment.  I provided 40 minutes of face-to-face time during this encounter and > 50% was spent counseling as documented under my assessment and plan.    Siad Davonna, MD  [facility] - A DEPT OF [facility] [address] [place] [address] Dept: [phone] Dept Fax: [phone]   No orders of the defined types were placed in this encounter.    ONCOLOGY  HISTORY:   Oncology History  Breast cancer of upper-outer quadrant of left female breast (HCC)  11/02/2015 Mammogram   screening mammogram category B breast density, asymmetry  with indistinct margin in the left breast upper outer quadrant posterior depth, 2 internal mammary lymph nodes, 6 mm at 2:00 position axillary negative   11/21/2015 Initial Diagnosis   left breast biopsy: Invasive ductal carcinoma with associated low-grade DCIS with microcalcifications focally involving a papilloma, ER/PR 100%, HER-2 negative ratio 1.22   12/13/2015 Surgery   Left lumpectomy: Invasive ductal carcinoma 0.4 cm, DCIS 1.6 cm, clear margins, ER 100%, PR 100%, HER-2 negative ratio 1.22, Ki-67 5% T1 N0 stage IA   12/21/2015 - 03/20/2016 Anti-estrogen oral therapy   Tamoxifen  20 mg daily decreased to 10 mg daily, unable to tolerate it.   10/31/2020 Relapse/Recurrence   Recurrent disease: Multifocal  0.9 cm, 0.3 cm and an intramammary lymph node all 3 biopsy-proven grade 1 IDC  ER/PR positive  HER-2 equivocal FISH Negative,  Ki-67 2%   Colon cancer metastasized to liver (HCC)  06/25/2024 Imaging   CT abdomen and pelvis with contrast:  IMPRESSION: 1. Circumferential thickening in the cecum concerning for primary colon adenocarcinoma given the endoscopy findings. 2. Enlarged lymph node in the ileocecal mesentery concerning for local nodal metastasis. 3. Hypoenhancing mass in the LEFT hepatic lobe concerning for hepatic metastasis. 4. Cholelithiasis without evidence cholecystitis. 5. Sigmoid diverticulosis without evidence diverticulitis.   06/25/2024 Procedure   Colonoscopy:  Impression:  - Large bulky tumor arising out of what appears to be ileocecal valve/ cecum-biopsied Hepatic flexure polyps removed with cold snare -1. 5 cm carpet polyp distal to the cecal mass not manipulated. Tattoo placed distal to the cecal mass to include the 1. 5 cm polyp.  -High diverticular burden in the descending and sigmoid segments.   06/28/2024 Pathology Results   FINAL MICROSCOPIC DIAGNOSIS:  A. COLON, RIGHT, RESECTION: -  Invasive well to moderately differentiated adenocarcinoma (4.5 cm in  greatest dimension) with extension through submucosa, muscularis propria, subserosal tissue with adherence and extension into terminal ileum interpreted as pT4b. -  Margins negative -  24 lymph nodes negative for malignancy (0/24)  B. LIVER, BIOPSY: -  Metastatic adenocarcinoma morphologically consistent with the above colonic adenocarcinoma.  ONCOLOGY TABLE: Lymphovascular Invasion: Not identified Perineural Invasion: Not identified Margin Status for Invasive Carcinoma: All margins negative for invasive carcinoma Pathologic Stage Classification (pTNM, AJCC 8th Edition): pT4b, pN0,pM1a MMR/MSI testing : Preserved    07/15/2024 Initial Diagnosis   Colon cancer metastasized to liver (HCC)   07/15/2024 Cancer Staging   Staging form: Colon and Rectum, AJCC 8th Edition - Pathologic stage from 07/15/2024: Stage IVA (pT4b, pN0, pM1a) - Signed by Davonna Siad, MD on 07/15/2024 Stage prefix: Initial diagnosis Total positive nodes: 0 Total nodes examined: 24       Current Treatment: Hospice  INTERVAL HISTORY:   Hailey Graham is here today for follow up. Patient is accompanied by her 2 nieces and grand nephew.  She reported feeling okay and had no complaints today.  She reported no significant improvement with IV iron.  She has no other complaints today.  She is currently living at the [facility].  We discussed extensively today about her goals of care and all the treatment options available.  Patient decided against chemotherapy and radiation at this time and is agreeable to proceed with hospice.  All the questions and concerns of patient and family answered in detail.  Emotional support provided during  this visit.   I have reviewed the past medical history, past surgical history, social history and family history with the patient and they are unchanged from previous note.  ALLERGIES:  is allergic to brimonidine, sulfa drugs cross reactors, codeine, morphine, and  sulfamethoxazole.  MEDICATIONS:  Current Outpatient Medications  Medication Sig Dispense Refill   acetaminophen  (TYLENOL ) 500 MG tablet Take 1,000 mg by mouth every 8 (eight) hours as needed for mild pain (pain score 1-3), moderate pain (pain score 4-6), fever or headache.     ALPRAZolam  (XANAX ) 0.25 MG tablet Take 1 tablet (0.25 mg total) by mouth daily as needed for anxiety. 5 tablet 0   anastrozole  (ARIMIDEX ) 1 MG tablet Take 1 tablet (1 mg total) by mouth daily. 90 tablet 3   Cholecalciferol (VITAMIN D ) 2000 UNITS tablet Take 2,000 Units by mouth daily.     Cyanocobalamin (B-12) 1000 MCG TABS Take 1 tablet by mouth daily.     Dorzolamide  HCl-Timolol  Mal PF 2-0.5 % SOLN Apply 1 drop to eye in the morning and at bedtime.     dorzolamide -timolol  (COSOPT ) 22.3-6.8 MG/ML ophthalmic solution Place 1 drop into both eyes 2 (two) times daily.     ezetimibe -simvastatin  (VYTORIN ) 10-20 MG tablet TAKE ONE TABLET BY MOUTH EVERY DAY 90 tablet 3   feeding supplement (ENSURE SURGERY) LIQD Take 237 mLs by mouth 2 (two) times daily between meals.     ferrous sulfate 325 (65 FE) MG tablet Take 325 mg by mouth daily. Mon, Wed, Fri.     latanoprost  (XALATAN ) 0.005 % ophthalmic solution Place 1 drop into both eyes at bedtime.     metoprolol  succinate (TOPROL -XL) 25 MG 24 hr tablet TAKE (1/2) TABLET BY MOUTH TWICE DAILY 90 tablet 3   ondansetron  (ZOFRAN -ODT) 4 MG disintegrating tablet Take 4 mg by mouth every 6 (six) hours as needed for nausea or vomiting.     pantoprazole  (PROTONIX ) 40 MG tablet Take 1 tablet (40 mg total) by mouth daily.     pilocarpine  (PILOCAR) 1 % ophthalmic solution Place 1 drop into the right eye 4 times daily.     rivaroxaban  (XARELTO ) 20 MG TABS tablet Take 20 mg by mouth daily with supper.     sodium bicarbonate  650 MG tablet Take 1 tablet (650 mg total) by mouth 2 (two) times daily.     magnesium  oxide (MAG-OX) 400 (240 Mg) MG tablet Take 1 tablet (400 mg total) by mouth daily. X 4  days (Patient not taking: Reported on 07/29/2024)     No current facility-administered medications for this visit.    REVIEW OF SYSTEMS:   Constitutional: Denies fevers, chills or abnormal weight loss Eyes: Denies blurriness of vision Ears, nose, mouth, throat, and face: Denies mucositis or sore throat Respiratory: Denies cough, dyspnea or wheezes Cardiovascular: Denies palpitation, chest discomfort or lower extremity swelling Gastrointestinal:  Denies nausea, heartburn or change in bowel habits Skin: Denies abnormal skin rashes Lymphatics: Denies new lymphadenopathy or easy bruising Neurological:Denies numbness, tingling or new weaknesses Behavioral/Psych: Mood is stable, no new changes  All other systems were reviewed with the patient and are negative.   VITALS:  Blood pressure 102/61, pulse 66, temperature (!) 97.5 F (36.4 C), temperature source Oral, resp. rate 18, weight 152 lb 8.9 oz (69.2 kg), SpO2 99%.  Wt Readings from Last 3 Encounters:  07/29/24 152 lb 8.9 oz (69.2 kg)  07/29/24 152 lb (68.9 kg)  07/16/24 153 lb 12.8 oz (69.8 kg)    Body mass  index is 24.62 kg/m.  Performance status (ECOG): 3 - Symptomatic, >50% confined to bed  PHYSICAL EXAM:   GENERAL:alert, no distress and comfortable SKIN: skin color, texture, turgor are normal, no rashes or significant lesions LUNGS: clear to auscultation and percussion with normal breathing effort HEART: regular rate & rhythm and no murmurs  ABDOMEN:abdomen soft, non-tender and normal bowel sounds Musculoskeletal:B/L pitting pedal edema extending up to the knee NEURO: alert & oriented x 3 with fluent speech, no focal motor/sensory deficits  LABORATORY DATA:  I have reviewed the data as listed    Component Value Date/Time   NA 139 07/12/2024 0800   NA 142 06/21/2024 1450   NA 142 11/29/2015 1243   K 3.6 07/12/2024 0800   K 3.9 11/29/2015 1243   CL 109 07/12/2024 0800   CO2 23 07/12/2024 0800   CO2 23 11/29/2015  1243   GLUCOSE 86 07/12/2024 0800   GLUCOSE 87 11/29/2015 1243   BUN 17 07/12/2024 0800   BUN 19 06/21/2024 1450   BUN 21.9 11/29/2015 1243   CREATININE 0.72 07/12/2024 0800   CREATININE 0.8 11/29/2015 1243   CALCIUM 9.1 07/12/2024 0800   CALCIUM 9.9 11/29/2015 1243   PROT 6.0 06/21/2024 1450   PROT 7.0 11/29/2015 1243   ALBUMIN 3.6 06/21/2024 1450   ALBUMIN 3.6 11/29/2015 1243   AST 19 06/21/2024 1450   AST 15 11/29/2015 1243   ALT 10 06/21/2024 1450   ALT 12 11/29/2015 1243   ALKPHOS 113 06/21/2024 1450   ALKPHOS 79 11/29/2015 1243   BILITOT 0.3 06/21/2024 1450   BILITOT 0.43 11/29/2015 1243   GFRNONAA >60 07/12/2024 0800   GFRAA 86 11/26/2016 1258     Lab Results  Component Value Date   WBC 9.5 07/15/2024   NEUTROABS 6.9 07/15/2024   HGB 9.3 (L) 07/15/2024   HCT 31.7 (L) 07/15/2024   MCV 79.3 (L) 07/15/2024   PLT 453 (H) 07/15/2024      Chemistry      Component Value Date/Time   NA 139 07/12/2024 0800   NA 142 06/21/2024 1450   NA 142 11/29/2015 1243   K 3.6 07/12/2024 0800   K 3.9 11/29/2015 1243   CL 109 07/12/2024 0800   CO2 23 07/12/2024 0800   CO2 23 11/29/2015 1243   BUN 17 07/12/2024 0800   BUN 19 06/21/2024 1450   BUN 21.9 11/29/2015 1243   CREATININE 0.72 07/12/2024 0800   CREATININE 0.8 11/29/2015 1243   GLU 101 07/23/2019 0000      Component Value Date/Time   CALCIUM 9.1 07/12/2024 0800   CALCIUM 9.9 11/29/2015 1243   ALKPHOS 113 06/21/2024 1450   ALKPHOS 79 11/29/2015 1243   AST 19 06/21/2024 1450   AST 15 11/29/2015 1243   ALT 10 06/21/2024 1450   ALT 12 11/29/2015 1243   BILITOT 0.3 06/21/2024 1450   BILITOT 0.43 11/29/2015 1243       RADIOGRAPHIC STUDIES: I have personally reviewed the radiological images as listed and agreed with the findings in the report.  CT Chest W Contrast CLINICAL DATA:  Metastatic colon cancer restaging  * Tracking Code: BO *  EXAM: CT CHEST WITH CONTRAST  TECHNIQUE: Multidetector CT imaging  of the chest was performed during intravenous contrast administration.  RADIATION DOSE REDUCTION: This exam was performed according to the departmental dose-optimization program which includes automated exposure control, adjustment of the mA and/or kV according to patient size and/or use of iterative reconstruction technique.  CONTRAST:  80mL OMNIPAQUE  IOHEXOL  300 MG/ML  SOLN  COMPARISON:  Multiple exams, including 10/26/2009 and chest radiograph 06/25/2024  FINDINGS: Cardiovascular: Small amount of peripheral filling defect in the right upper lobe pulmonary artery, image 60 series 2, compatible with mild chronic pulmonary embolus. This is not central in the vessel and is not thought to represent acute pulmonary embolus.  Aortic atherosclerotic vascular calcification.  Substantial collateralization of the left upper extremity venous injection with evidence of prominent stenosis of the left subclavian vein.  Mediastinum/Nodes: 1.8 cm hypodense right thyroid  nodule on image 23 series 2. Recommend thyroid  US  (ref: J Am Coll Radiol. 2015 Feb;12(2): 143-50).  Lungs/Pleura: Scarring or atelectasis in both lower lobes.  A 5 by 3 mm right lower lobe nodule on image 94 series 3 is stable from 10/26/2009 hence considered benign.  3 mm lingular peribronchovascular nodule on image 86 series 3, not present in 2010.  3 mm right apical peribronchovascular nodule on image 39 series 3, not present in 2010.  Upper Abdomen: Hepatic lesions in the lateral segment left hepatic lobe, with the dominant left hepatic lobe lesion potentially measuring up to about 4 cm, similar to 06/25/2024. Of this, a hypodense 1.7 by 1.4 cm lesion is nonspecific and could reflect a cyst.  Musculoskeletal: Thoracic spondylosis.  IMPRESSION: 1. Small amount of peripheral filling defect in the right upper lobe pulmonary artery compatible with mild chronic pulmonary embolus. This is not central in the vessel and is  not thought to represent acute pulmonary embolus. 2. Substantial collateralization of the left upper extremity venous injection with evidence of prominent stenosis of the left subclavian vein. 3. 1.8 cm hypodense right thyroid  nodule. Thyroid  ultrasound is recommended appears 4. Hepatic lesions in the lateral segment left hepatic lobe, with the dominant left hepatic lobe mass suspicious for metastatic lesion measuring about 4 cm, similar to 06/25/2024. Of this, a hypodense 1.7 by 1.4 cm lesion is nonspecific and could reflect a cyst. 5. Two tiny (3 mm diameter) bilateral pulmonary nodules, not present in 2010. These are nonspecific and could be post inflammatory or neoplastic. Surveillance suggested in the context of the patient's follow up oncology imaging. 6.  Aortic Atherosclerosis (ICD10-I70.0).  Electronically Signed   By: Ryan Salvage M.D.   On: 07/23/2024 10:49

## 2024-07-29 NOTE — Assessment & Plan Note (Addendum)
 Patient has a past medical history of recurrent endocrine receptor positive breast cancer.  Did not tolerate tamoxifen . Previously seen Dr. Gudena and is currently on anastrozole  Last mammogram in 10/2023: No evidence of disease  - Discussed with the patient that with her decision to go with hospice, she can withhold all the treatments with the goal of prolonging her life and should rather focus on keeping her comfortable.  Can discontinue anastrozole  at this time.

## 2024-07-29 NOTE — Assessment & Plan Note (Addendum)
 Discussed goals of care with the patient considering patient has metastatic stage IV carcinoma.  - Patient at this time desires comfort, avoid aggressive treatment and prefers quality of life.  Wishes to avoid life-prolonging measures - DNR/DNI as per patient's request - Patient decided to proceed with hospice care.  Will place a referral today.

## 2024-07-29 NOTE — Progress Notes (Signed)
 Location:  [facility] Room Number: 130-  P Place of Service:  SNF (31)   CODE STATUS: dnr   Allergies  Allergen Reactions   Brimonidine Other (See Comments)    Crusting - difficulty opening eyes   Sulfa Drugs Cross Reactors Other (See Comments)    Unknown    Codeine Nausea Only   Morphine Nausea Only   Sulfamethoxazole Nausea Only    Chief Complaint  Patient presents with   Medical Management of Chronic Issues           iron deficiency anemia due to chronic blood loss: Colon cancer metastasized to liver  Situational anxiety    HPI:  She is a 89 y.o. long term resident of this facility being seen for the management of her chronic illnesses: iron deficiency anemia due to chronic blood loss: Colon cancer metastasized to liver  Situational anxiety. There are no reports of uncontrolled pain. Her weight is without significant change in the past month.    Past Medical History:  Diagnosis Date   Allergy    Anxiety    Arthritis    Breast cancer of upper-outer quadrant of left female breast (HCC) 11/24/2015   Cataract    Complication of anesthesia    Depression    Diverticulosis    Gait abnormality    Gastroesophageal reflux disease    Glaucoma    Hyperlipidemia    Lipid profile in 05/2010:149, 86, 48, 84   Lung nodule    Right lower lobe; stable in 2010   Paroxysmal atrial fibrillation (HCC)    Rate related right bundle branch block; normal EF on echo in 2006; normal stress nuclear-2006; mild RV hypokinesis on MRI;  RA and RV enlargement on CT in 9/06; Anticoagulation->discontinued in 2009   Personal history of kidney stones    PONV (postoperative nausea and vomiting)    Nausea    Past Surgical History:  Procedure Laterality Date   ABDOMINAL HYSTERECTOMY  2202   ANKLE SURGERY Right 12/23/2001   Fracture- rod   APPENDECTOMY     as a teenager   BREAST LUMPECTOMY Left 2016   invasive ductal ca   BREAST LUMPECTOMY WITH RADIOACTIVE SEED  LOCALIZATION Left 12/13/2015   Procedure: BREAST LUMPECTOMY WITH RADIOACTIVE SEED LOCALIZATION;  Surgeon: Morene Olives, MD;  Location: [facility] OR;  Service: General;  Laterality: Left;   CATARACT EXTRACTION W/ INTRAOCULAR LENS IMPLANT Right 12/23/2012   COLONOSCOPY N/A 06/25/2024   Procedure: COLONOSCOPY;  Surgeon: Shaaron Lamar HERO, MD;  [facility];  Service: Endoscopy;  Laterality: N/A;   COLONOSCOPY W/ POLYPECTOMY     COLOSTOMY REVISION Right 06/28/2024   Procedure: RIGHT HEMI-COLECTOMY WITH  LIVER BIOPSY;  Surgeon: Kallie Manuelita BROCKS, MD;  Location: [facility] ORS;  Service: General;  Laterality: Right;   ESOPHAGOGASTRODUODENOSCOPY     ESOPHAGOGASTRODUODENOSCOPY N/A 06/25/2024   Procedure: EGD (ESOPHAGOGASTRODUODENOSCOPY);  Surgeon: Shaaron Lamar HERO, MD;  [facility]: [facility] SU[facility];  Service: Endoscopy;  Laterality: N/A;   GLAUCOMA SURGERY Right 12/23/2012   HERNIA REPAIR Right    1990's   ORIF ANKLE FRACTURE  12/23/2001   Right   TONSILLECTOMY     age 13   Varicose veins Right 2207    Social History   Socioeconomic History   Marital status: Widowed    Spouse name: Not on file   Number of children: 0   Years of education: 15   Highest education level: Not on file  Occupational History  Occupation: Retired  Tobacco Use   Smoking status: Never   Smokeless tobacco: Never  Vaping Use   Vaping status: Never Used  Substance and Sexual Activity   Alcohol use: No    Alcohol/week: 0.0 standard drinks of alcohol   Drug use: No   Sexual activity: Not Currently  Other Topics Concern   Not on file  Social History Narrative   Lives at home with husband.   Right-handed.   No caffeine use.   Social Drivers of Corporate investment banker Strain: Not on file  Food Insecurity: No Food Insecurity (07/15/2024)   Hunger Vital Sign    Worried About Running Out of Food in the Last Year: Never true    Ran Out of Food in the Last Year: Never true  Transportation Needs: No  Transportation Needs (07/15/2024)   PRAPARE - Administrator, Civil Service (Medical): No    Lack of Transportation (Non-Medical): No  Physical Activity: Not on file  Stress: Not on file  Social Connections: Socially Isolated (06/22/2024)   Social Connection and Isolation Panel    Frequency of Communication with Friends and Family: More than three times a week    Frequency of Social Gatherings with Friends and Family: Twice a week    Attends Religious Services: Never    Database administrator or Organizations: No    Attends Banker Meetings: Never    Marital Status: Widowed  Intimate Partner Violence: Not At Risk (07/15/2024)   Humiliation, Afraid, Rape, and Kick questionnaire    Fear of Current or Ex-Partner: No    Emotionally Abused: No    Physically Abused: No    Sexually Abused: No   Family History  Problem Relation Age of Onset   Healthy Mother    Cerebral aneurysm Father    Lung cancer Sister       VITAL SIGNS BP 120/74   Pulse 74   Ht 5' 6 (1.676 m)   Wt 152 lb (68.9 kg)   BMI 24.53 kg/m   Outpatient Encounter Medications as of 07/29/2024  Medication Sig Note   acetaminophen  (TYLENOL ) 500 MG tablet Take 1,000 mg by mouth every 8 (eight) hours as needed for mild pain (pain score 1-3), moderate pain (pain score 4-6), fever or headache.    anastrozole  (ARIMIDEX ) 1 MG tablet Take 1 tablet (1 mg total) by mouth daily.    Cholecalciferol (VITAMIN D ) 2000 UNITS tablet Take 2,000 Units by mouth daily.    Cyanocobalamin (B-12) 1000 MCG TABS Take 1 tablet by mouth daily.    Dorzolamide  HCl-Timolol  Mal PF 2-0.5 % SOLN Apply 1 drop to eye in the morning and at bedtime.    ezetimibe -simvastatin  (VYTORIN ) 10-20 MG tablet TAKE ONE TABLET BY MOUTH EVERY DAY    feeding supplement (ENSURE SURGERY) LIQD Take 237 mLs by mouth 2 (two) times daily between meals.    ferrous sulfate 325 (65 FE) MG tablet Take 325 mg by mouth daily. Mon, Wed, Fri.    latanoprost   (XALATAN ) 0.005 % ophthalmic solution Place 1 drop into both eyes at bedtime.    metoprolol  succinate (TOPROL -XL) 25 MG 24 hr tablet TAKE (1/2) TABLET BY MOUTH TWICE DAILY    ondansetron  (ZOFRAN -ODT) 4 MG disintegrating tablet Take 4 mg by mouth every 6 (six) hours as needed for nausea or vomiting.    pantoprazole  (PROTONIX ) 40 MG tablet Take 1 tablet (40 mg total) by mouth daily.    pilocarpine  (PILOCAR) 1 %  ophthalmic solution Place 1 drop into the right eye 4 times daily. 06/22/2024: Comes from [facility] [address], [place]   rivaroxaban  (XARELTO ) 20 MG TABS tablet Take 20 mg by mouth daily with supper.    sodium bicarbonate  650 MG tablet Take 1 tablet (650 mg total) by mouth 2 (two) times daily.    ALPRAZolam  (XANAX ) 0.25 MG tablet Take 1 tablet (0.25 mg total) by mouth daily as needed for anxiety.    dorzolamide -timolol  (COSOPT ) 22.3-6.8 MG/ML ophthalmic solution Place 1 drop into both eyes 2 (two) times daily.    magnesium  oxide (MAG-OX) 400 (240 Mg) MG tablet Take 1 tablet (400 mg total) by mouth daily. X 4 days (Patient not taking: Reported on 07/29/2024)    No facility-administered encounter medications on file as of 07/29/2024.     SIGNIFICANT DIAGNOSTIC EXAMS  LABS  06-22-24: wbc 4.9; hgb 5.6; hct 20.5; mcv 72.2 plt 247; glucose 99; bun 21; creat 0.74;k+ 3.6; na++ 136; ca 9.5; gfr >60 06-25-24: wbc 9.1; hgb 10.5; hct 34.7; mcv 76.1 plt 264; glucose 97; bun 10; creat 0.60; k+ 3.3; na++ 136; ca 9.2 gfr>60  07-02-24: wbc 8.2; hgb 8.9; hct 30.4; mcv 77.2; plt 263; glucose 97 ;bun 27; creat 0.67; k+ 3.4; na++ 139; ca 9.1; gfr >60 mag 1.7; phos 2.9   TODAY  07-09-24: hgb 7.7; hct 25.8; glucose 87; bun 22; creat 0.69; k+ 3.4; na++ 139; ca 8.9 gfr >60 iron 20; tibc 238 mag 1.9 07-12-24: hgb 8.4; hct 28.2; glucose 86; bun 17; creat 0.72; k+ 3.6; an++ 139; ca 9.1 gfr >60 07-15-24: wbc 9.5; hgb 9.3; hct 31.7; mcv 79.3 plt 453; iron 19; tibc 306   Review of Systems  Constitutional:  Negative for  malaise/fatigue.  Respiratory:  Negative for cough and shortness of breath.   Cardiovascular:  Negative for chest pain, palpitations and leg swelling.  Gastrointestinal:  Negative for abdominal pain, constipation and heartburn.  Musculoskeletal:  Negative for back pain, joint pain and myalgias.  Skin: Negative.   Neurological:  Negative for dizziness.  Psychiatric/Behavioral:  The patient is not nervous/anxious.     Physical Exam Constitutional:      General: She is not in acute distress.    Appearance: She is well-developed. She is not diaphoretic.  Neck:     Thyroid : No thyromegaly.  Cardiovascular:     Rate and Rhythm: Normal rate and regular rhythm.     Heart sounds: Normal heart sounds.  Pulmonary:     Effort: Pulmonary effort is normal. No respiratory distress.     Breath sounds: Normal breath sounds.  Abdominal:     General: Bowel sounds are normal. There is no distension.     Palpations: Abdomen is soft.     Tenderness: There is no abdominal tenderness.  Musculoskeletal:        General: Normal range of motion.     Cervical back: Neck supple.     Right lower leg: No edema.     Left lower leg: No edema.  Lymphadenopathy:     Cervical: No cervical adenopathy.  Skin:    General: Skin is warm and dry.  Neurological:     Mental Status: She is alert. Mental status is at baseline.  Psychiatric:        Mood and Affect: Mood normal.      ASSESSMENT/ PLAN:  TODAY  Lower GI bleed/iron deficiency anemia due to chronic blood loss: hgb 9.3; is on iron three days weekly  2. Colon cancer metastasized to liver : is being followed by hospice care.   3. Situational anxiety: has xanax  0.25 mg daily as needed   PREVIOUS   4. Hyperlipidemia unspecified hyperlipidemia type: will continue vytorin  40/10 mg daily   5. Increased intraocular pressure bilateral: will continue cosopt  both eyes twice daily; latanoprost  both eyes nightly pilocarpine  right eye four times daily   6.  Paroxysmal atrial fibrillation: heart rate stable will continue toprol  xl 12.5 mg twice daily for rate control and xarelto  20 mg daily   7. Malignant neoplasm of upper outer left breast in female estrogen receptor positive: will continue anastrozole  1 mg daily   8. Hypomagnesemia/hypokalemia: k+ 3.6; mag 1.9   9. Metabolic acidosis: will continue sodium bicarb 650 mg twice daily      Barnie Seip NP [facility]  call [phone]

## 2024-08-02 ENCOUNTER — Non-Acute Institutional Stay (SKILLED_NURSING_FACILITY): Admitting: Adult Health

## 2024-08-02 ENCOUNTER — Encounter: Payer: Self-pay | Admitting: Adult Health

## 2024-08-02 DIAGNOSIS — C180 Malignant neoplasm of cecum: Secondary | ICD-10-CM

## 2024-08-02 DIAGNOSIS — H401411 Capsular glaucoma with pseudoexfoliation of lens, right eye, mild stage: Secondary | ICD-10-CM | POA: Diagnosis not present

## 2024-08-02 DIAGNOSIS — C189 Malignant neoplasm of colon, unspecified: Secondary | ICD-10-CM | POA: Diagnosis not present

## 2024-08-02 DIAGNOSIS — Z170 Estrogen receptor positive status [ER+]: Secondary | ICD-10-CM

## 2024-08-02 DIAGNOSIS — C787 Secondary malignant neoplasm of liver and intrahepatic bile duct: Secondary | ICD-10-CM

## 2024-08-02 DIAGNOSIS — D500 Iron deficiency anemia secondary to blood loss (chronic): Secondary | ICD-10-CM | POA: Diagnosis not present

## 2024-08-02 DIAGNOSIS — I480 Paroxysmal atrial fibrillation: Secondary | ICD-10-CM | POA: Diagnosis not present

## 2024-08-02 DIAGNOSIS — H401423 Capsular glaucoma with pseudoexfoliation of lens, left eye, severe stage: Secondary | ICD-10-CM | POA: Diagnosis not present

## 2024-08-02 DIAGNOSIS — C50412 Malignant neoplasm of upper-outer quadrant of left female breast: Secondary | ICD-10-CM

## 2024-08-02 NOTE — Progress Notes (Signed)
 Location:  [facility] Room Number: 130 Place of Service:  SNF (31)   CODE STATUS: dnr   Allergies  Allergen Reactions   Brimonidine Other (See Comments)    Crusting - difficulty opening eyes   Sulfa Drugs Cross Reactors Other (See Comments)    Unknown    Codeine Nausea Only   Morphine Nausea Only   Sulfamethoxazole Nausea Only    Chief Complaint  Patient presents with   Discharge Note    HPI:  She is being discharged to assisted living. Her medications and medical follow up will be provided by the facility. She is being followed by hospice care and will not have any home health. She will not need any dme. She was hospitalized for a low hgb. She had a colonoscopy and was found to have an adenocarcinoma. She was admitted to this facility for short term rehab: ambulate 150 feet with rolling walker and supervision; upper body and lower body mod I assist; brp mod I assist.   Past Medical History:  Diagnosis Date   Allergy    Anxiety    Arthritis    Breast cancer of upper-outer quadrant of left female breast (HCC) 11/24/2015   Cataract    Complication of anesthesia    Depression    Diverticulosis    Gait abnormality    Gastroesophageal reflux disease    Glaucoma    Hyperlipidemia    Lipid profile in 05/2010:149, 86, 48, 84   Lung nodule    Right lower lobe; stable in 2010   Paroxysmal atrial fibrillation (HCC)    Rate related right bundle branch block; normal EF on echo in 2006; normal stress nuclear-2006; mild RV hypokinesis on MRI;  RA and RV enlargement on CT in 9/06; Anticoagulation->discontinued in 2009   Personal history of kidney stones    PONV (postoperative nausea and vomiting)    Nausea    Past Surgical History:  Procedure Laterality Date   ABDOMINAL HYSTERECTOMY  2202   ANKLE SURGERY Right 12/23/2001   Fracture- rod   APPENDECTOMY     as a teenager   BREAST LUMPECTOMY Left 2016   invasive ductal ca   BREAST LUMPECTOMY WITH  RADIOACTIVE SEED LOCALIZATION Left 12/13/2015   Procedure: BREAST LUMPECTOMY WITH RADIOACTIVE SEED LOCALIZATION;  Surgeon: Morene Olives, MD;  Location: [facility] OR;  Service: General;  Laterality: Left;   CATARACT EXTRACTION W/ INTRAOCULAR LENS IMPLANT Right 12/23/2012   COLONOSCOPY N/A 06/25/2024   Procedure: COLONOSCOPY;  Surgeon: Shaaron Lamar HERO, MD;  [facility];  Service: Endoscopy;  Laterality: N/A;   COLONOSCOPY W/ POLYPECTOMY     COLOSTOMY REVISION Right 06/28/2024   Procedure: RIGHT HEMI-COLECTOMY WITH  LIVER BIOPSY;  Surgeon: Kallie Manuelita BROCKS, MD;  Location: [facility] ORS;  Service: General;  Laterality: Right;   ESOPHAGOGASTRODUODENOSCOPY     ESOPHAGOGASTRODUODENOSCOPY N/A 06/25/2024   Procedure: EGD (ESOPHAGOGASTRODUODENOSCOPY);  Surgeon: Shaaron Lamar HERO, MD;  [facility];  Service: Endoscopy;  Laterality: N/A;   GLAUCOMA SURGERY Right 12/23/2012   HERNIA REPAIR Right    1990's   ORIF ANKLE FRACTURE  12/23/2001   Right   TONSILLECTOMY     age 8   Varicose veins Right 2207    Social History   Socioeconomic History   Marital status: Widowed    Spouse name: Not on file   Number of children: 0   Years of education: 15   Highest education level: Not on file  Occupational  History   Occupation: Retired  Tobacco Use   Smoking status: Never   Smokeless tobacco: Never  Vaping Use   Vaping status: Never Used  Substance and Sexual Activity   Alcohol use: No    Alcohol/week: 0.0 standard drinks of alcohol   Drug use: No   Sexual activity: Not Currently  Other Topics Concern   Not on file  Social History Narrative   Lives at home with husband.   Right-handed.   No caffeine use.   Social Drivers of Corporate investment banker Strain: Not on file  Food Insecurity: No Food Insecurity (07/15/2024)   Hunger Vital Sign    Worried About Running Out of Food in the Last Year: Never true    Ran Out of Food in the Last Year: Never true  Transportation Needs:  No Transportation Needs (07/15/2024)   PRAPARE - Administrator, Civil Service (Medical): No    Lack of Transportation (Non-Medical): No  Physical Activity: Not on file  Stress: Not on file  Social Connections: Socially Isolated (06/22/2024)   Social Connection and Isolation Panel    Frequency of Communication with Friends and Family: More than three times a week    Frequency of Social Gatherings with Friends and Family: Twice a week    Attends Religious Services: Never    Database administrator or Organizations: No    Attends Banker Meetings: Never    Marital Status: Widowed  Intimate Partner Violence: Not At Risk (07/15/2024)   Humiliation, Afraid, Rape, and Kick questionnaire    Fear of Current or Ex-Partner: No    Emotionally Abused: No    Physically Abused: No    Sexually Abused: No   Family History  Problem Relation Age of Onset   Healthy Mother    Cerebral aneurysm Father    Lung cancer Sister       VITAL SIGNS BP 136/75   Pulse 78   Temp (!) 97.5 F (36.4 C)   Resp 18   Ht 5' 6 (1.676 m)   Wt 153 lb 9.6 oz (69.7 kg)   SpO2 97%   BMI 24.79 kg/m   Outpatient Encounter Medications as of 08/02/2024  Medication Sig Note   acetaminophen  (TYLENOL ) 500 MG tablet Take 1,000 mg by mouth every 8 (eight) hours as needed for mild pain (pain score 1-3), moderate pain (pain score 4-6), fever or headache.    ALPRAZolam  (XANAX ) 0.25 MG tablet Take 1 tablet (0.25 mg total) by mouth daily as needed for anxiety.    anastrozole  (ARIMIDEX ) 1 MG tablet Take 1 tablet (1 mg total) by mouth daily.    Cholecalciferol (VITAMIN D ) 2000 UNITS tablet Take 2,000 Units by mouth daily.    Cyanocobalamin (B-12) 1000 MCG TABS Take 1 tablet by mouth daily.    Dorzolamide  HCl-Timolol  Mal PF 2-0.5 % SOLN Apply 1 drop to eye in the morning and at bedtime.    dorzolamide -timolol  (COSOPT ) 22.3-6.8 MG/ML ophthalmic solution Place 1 drop into both eyes 2 (two) times daily.     ezetimibe -simvastatin  (VYTORIN ) 10-20 MG tablet TAKE ONE TABLET BY MOUTH EVERY DAY    feeding supplement (ENSURE SURGERY) LIQD Take 237 mLs by mouth 2 (two) times daily between meals.    ferrous sulfate 325 (65 FE) MG tablet Take 325 mg by mouth daily. Mon, Wed, Fri.    latanoprost  (XALATAN ) 0.005 % ophthalmic solution Place 1 drop into both eyes at bedtime.    magnesium   oxide (MAG-OX) 400 (240 Mg) MG tablet Take 1 tablet (400 mg total) by mouth daily. X 4 days (Patient not taking: Reported on 07/29/2024)    metoprolol  succinate (TOPROL -XL) 25 MG 24 hr tablet TAKE (1/2) TABLET BY MOUTH TWICE DAILY    ondansetron  (ZOFRAN -ODT) 4 MG disintegrating tablet Take 4 mg by mouth every 6 (six) hours as needed for nausea or vomiting.    pantoprazole  (PROTONIX ) 40 MG tablet Take 1 tablet (40 mg total) by mouth daily.    pilocarpine  (PILOCAR) 1 % ophthalmic solution Place 1 drop into the right eye 4 times daily. 06/22/2024: Comes from [facility] [address], [place]   rivaroxaban  (XARELTO ) 20 MG TABS tablet Take 20 mg by mouth daily with supper.    sodium bicarbonate  650 MG tablet Take 1 tablet (650 mg total) by mouth 2 (two) times daily.    No facility-administered encounter medications on file as of 08/02/2024.     SIGNIFICANT DIAGNOSTIC EXAMS  LABS  06-22-24: wbc 4.9; hgb 5.6; hct 20.5; mcv 72.2 plt 247; glucose 99; bun 21; creat 0.74;k+ 3.6; na++ 136; ca 9.5; gfr >60 06-25-24: wbc 9.1; hgb 10.5; hct 34.7; mcv 76.1 plt 264; glucose 97; bun 10; creat 0.60; k+ 3.3; na++ 136; ca 9.2 gfr>60  07-02-24: wbc 8.2; hgb 8.9; hct 30.4; mcv 77.2; plt 263; glucose 97 ;bun 27; creat 0.67; k+ 3.4; na++ 139; ca 9.1; gfr >60 mag 1.7; phos 2.9  07-09-24: hgb 7.7; hct 25.8; glucose 87; bun 22; creat 0.69; k+ 3.4; na++ 139; ca 8.9 gfr >60 iron 20; tibc 238 mag 1.9 07-12-24: hgb 8.4; hct 28.2; glucose 86; bun 17; creat 0.72; k+ 3.6; an++ 139; ca 9.1 gfr >60 07-15-24: wbc 9.5; hgb 9.3; hct 31.7; mcv 79.3 plt 453; iron 19; tibc 306    Review of Systems  Constitutional:  Negative for malaise/fatigue.  Respiratory:  Negative for cough and shortness of breath.   Cardiovascular:  Negative for chest pain, palpitations and leg swelling.  Gastrointestinal:  Negative for abdominal pain, constipation and heartburn.  Musculoskeletal:  Negative for back pain, joint pain and myalgias.  Skin: Negative.   Neurological:  Negative for dizziness.  Psychiatric/Behavioral:  The patient is not nervous/anxious.    Physical Exam Constitutional:      General: She is not in acute distress.    Appearance: She is well-developed. She is not diaphoretic.  Neck:     Thyroid : No thyromegaly.  Cardiovascular:     Rate and Rhythm: Normal rate and regular rhythm.     Heart sounds: Normal heart sounds.  Pulmonary:     Effort: Pulmonary effort is normal. No respiratory distress.     Breath sounds: Normal breath sounds.  Abdominal:     General: Bowel sounds are normal. There is no distension.     Palpations: Abdomen is soft.     Tenderness: There is no abdominal tenderness.  Musculoskeletal:        General: Normal range of motion.     Cervical back: Neck supple.     Right lower leg: No edema.     Left lower leg: No edema.  Lymphadenopathy:     Cervical: No cervical adenopathy.  Skin:    General: Skin is warm and dry.  Neurological:     Mental Status: She is alert. Mental status is at baseline.  Psychiatric:        Mood and Affect: Mood normal.      ASSESSMENT/ PLAN:   Patient is being  discharged with the following home health services:  none required  Patient is being discharged with the following durable medical equipment:  none required   Patient has been advised to f/u with their PCP in 1-2 weeks to for a transitions of care visit.  Social services at their facility was responsible for arranging this appointment.  Pt was provided with adequate prescriptions of noncontrolled medications to reach the scheduled appointment .   For controlled substances, a limited supply was provided as appropriate for the individual patient.  If the pt normally receives these medications from a pain clinic or has a contract with another physician, these medications should be received from that clinic or physician only).    Her medications will be provided by the assist living.   Hailey Seip NP [facility]  call [phone]

## 2024-08-03 ENCOUNTER — Other Ambulatory Visit: Payer: Self-pay

## 2024-08-03 ENCOUNTER — Telehealth: Payer: Self-pay | Admitting: Internal Medicine

## 2024-08-03 ENCOUNTER — Encounter: Admitting: Surgery

## 2024-08-03 DIAGNOSIS — Z79899 Other long term (current) drug therapy: Secondary | ICD-10-CM

## 2024-08-03 DIAGNOSIS — R609 Edema, unspecified: Secondary | ICD-10-CM

## 2024-08-03 MED ORDER — RIVAROXABAN 20 MG PO TABS: 20.0000 mg | ORAL_TABLET | Freq: Every day | ORAL | 5 refills | Status: DC

## 2024-08-03 NOTE — Telephone Encounter (Signed)
 Pt is being discharged to [facility].  They will be administering her medications.  D/C summary is not available in chart at this time to review.  [facility] should have d/c orders.

## 2024-08-03 NOTE — Telephone Encounter (Signed)
 Does pt need refill?  OK to refill.

## 2024-08-03 NOTE — Telephone Encounter (Signed)
 Prescription refill request for Xarelto  received.  Indication:afib Last office visit:6/25 Weight:69.7 Age:89 Scr:0.72  7/25 CrCl:53.31  ml/min  Prescription refilled

## 2024-08-03 NOTE — Telephone Encounter (Signed)
 Pt c/o medication issue:  1. Name of Medication:   rivaroxaban  (XARELTO ) 20 MG TABS tablet    2. How are you currently taking this medication (dosage and times per day)? Take 20 mg by mouth daily with supper.   3. Are you having a reaction (difficulty breathing--STAT)? No  4. What is your medication issue? Pt states that she will be getting discharged from hospital today and would like to know when she is able to start back taking above medication. Please advise

## 2024-08-04 NOTE — Telephone Encounter (Signed)
 Returned call to pt. No answer. Left msg to call back.

## 2024-08-05 DIAGNOSIS — M1711 Unilateral primary osteoarthritis, right knee: Secondary | ICD-10-CM | POA: Diagnosis not present

## 2024-08-05 NOTE — Telephone Encounter (Signed)
Called patient. No answer. Left message to call back.  

## 2024-08-05 NOTE — Telephone Encounter (Addendum)
 Pt is now a resident at [facility] assisted living. She would like to know when she is to restart Xarelto . Pt will like to know why they are still holding it. Pt c/o swelling of the legs and feet that started this week. She reports that her legs go down at night and swell during the day. Denies SOB and any pain. She is going to start wearing support hose. Please advise

## 2024-08-05 NOTE — Telephone Encounter (Signed)
 Called to say patient been trying to call our office but not able to get anyone. Patient is need to know when to start medication back. Advise office we have tried to reach the patient. Please advise

## 2024-08-06 NOTE — Telephone Encounter (Signed)
 I saw the pt in June 2025   She is s/p hemicolectomy    Followed in onclology It looks like anticoagulation was resumed    She will need to discuss with oncology risk/benefits in setting of metastatic cancer May resume with close follow up   Re edema:   Set up for CMET and BNP

## 2024-08-09 NOTE — Telephone Encounter (Signed)
 Called pt to inform of Dr. Nada response. Pt notified. [facility] nurse and [employer].

## 2024-08-09 NOTE — Telephone Encounter (Signed)
 Hailey Graham states that they need an order to restart Xarelto . Please advise.

## 2024-08-09 NOTE — Telephone Encounter (Signed)
 Call [phone]

## 2024-08-09 NOTE — Telephone Encounter (Signed)
 Returned call to [place]. Phone busy.

## 2024-08-09 NOTE — Addendum Note (Signed)
 Addended by: Arbie Reisz G on: 08/09/2024 04:56 PM   Modules accepted: Orders

## 2024-08-09 NOTE — Telephone Encounter (Signed)
 Caller Audria) stated she is returning RN's call.

## 2024-08-10 ENCOUNTER — Encounter: Payer: Self-pay | Admitting: General Surgery

## 2024-08-10 ENCOUNTER — Ambulatory Visit (INDEPENDENT_AMBULATORY_CARE_PROVIDER_SITE_OTHER): Admitting: General Surgery

## 2024-08-10 VITALS — BP 134/86 | HR 51 | Temp 97.5°F | Resp 14 | Ht 66.0 in | Wt 160.0 lb

## 2024-08-10 DIAGNOSIS — C180 Malignant neoplasm of cecum: Secondary | ICD-10-CM

## 2024-08-10 DIAGNOSIS — Z79899 Other long term (current) drug therapy: Secondary | ICD-10-CM | POA: Diagnosis not present

## 2024-08-10 DIAGNOSIS — R609 Edema, unspecified: Secondary | ICD-10-CM | POA: Diagnosis not present

## 2024-08-10 NOTE — Telephone Encounter (Signed)
 Crystal from [facility] is requesting a callback at [phone] regarding the order for Xarelto . Please advise

## 2024-08-10 NOTE — Progress Notes (Signed)
 [name]  Doing well. Eating. Drinking. Having Bms. Has talked to Oncology about her options and they recommended radiation which she does not want to pursue. She opted for hospice but at this time will hold off as she is doing well.  BP 134/86   Pulse (!) 51   Temp (!) 97.5 F (36.4 C) (Oral)   Resp 14   Ht 5' 6 (1.676 m)   Wt 160 lb (72.6 kg)   SpO2 97%   BMI 25.82 kg/m  Soft, nondistended nontender Healed incision, no hernia   Patient sp open right colectomy and liver biopsy for colon cancer and liver metastasis. She is doing well overall.  Diet as tolerated. No heavy lifting > 10 lbs, excessive bending, pushing, pulling, or squatting for 6-8 weeks after surgery.   Call with questions or concerns.   Follow up with Oncology as scheduled. Follow up with Hospice as needed.  PRN follow up here  Hailey Pander, MD [facility] [address], [place] [address] [phone] (office)

## 2024-08-10 NOTE — Patient Instructions (Addendum)
 Diet as tolerated. No heavy lifting > 10 lbs, excessive bending, pushing, pulling, or squatting for 6-8 weeks after surgery.   Call with questions or concerns.  Follow up with Oncology as scheduled. Follow up with Hospice as needed.

## 2024-08-11 ENCOUNTER — Telehealth: Payer: Self-pay | Admitting: Internal Medicine

## 2024-08-11 DIAGNOSIS — C189 Malignant neoplasm of colon, unspecified: Secondary | ICD-10-CM | POA: Diagnosis not present

## 2024-08-11 DIAGNOSIS — I480 Paroxysmal atrial fibrillation: Secondary | ICD-10-CM | POA: Diagnosis not present

## 2024-08-11 DIAGNOSIS — D509 Iron deficiency anemia, unspecified: Secondary | ICD-10-CM | POA: Diagnosis not present

## 2024-08-11 DIAGNOSIS — C787 Secondary malignant neoplasm of liver and intrahepatic bile duct: Secondary | ICD-10-CM | POA: Diagnosis not present

## 2024-08-11 LAB — COMPREHENSIVE METABOLIC PANEL WITH GFR
ALT: 19 IU/L (ref 0–32)
AST: 18 IU/L (ref 0–40)
Albumin: 3.5 g/dL — ABNORMAL LOW (ref 3.6–4.6)
Alkaline Phosphatase: 147 IU/L — ABNORMAL HIGH (ref 44–121)
BUN/Creatinine Ratio: 16 (ref 12–28)
BUN: 13 mg/dL (ref 10–36)
Bilirubin Total: 0.4 mg/dL (ref 0.0–1.2)
CO2: 20 mmol/L (ref 20–29)
Calcium: 10.1 mg/dL (ref 8.7–10.3)
Chloride: 107 mmol/L — ABNORMAL HIGH (ref 96–106)
Creatinine, Ser: 0.8 mg/dL (ref 0.57–1.00)
Globulin, Total: 2.5 g/dL (ref 1.5–4.5)
Glucose: 96 mg/dL (ref 70–99)
Potassium: 3.8 mmol/L (ref 3.5–5.2)
Sodium: 143 mmol/L (ref 134–144)
Total Protein: 6 g/dL (ref 6.0–8.5)
eGFR: 69 mL/min/1.73 (ref >59)

## 2024-08-11 LAB — BRAIN NATRIURETIC PEPTIDE: BNP: 236.9 pg/mL — AB (ref 0.0–100.0)

## 2024-08-11 NOTE — Telephone Encounter (Signed)
 Dr Okey, Please review Dr Armanda reply back to you re. Starting pt back on Xarelto  and let me know whether to resume Xarelto  or wait for pt to have further evaluation on bleeding from colon cancer. Thanks, Olam

## 2024-08-11 NOTE — Telephone Encounter (Signed)
 Called Hailey Graham at [facility] to discuss if pt is proceeding with Hospice/Pallative care so decision can be made regarding restarting her Xarelto .  No answer.  LM for her to call back tomorrow and ask for coumadin clinic.

## 2024-08-11 NOTE — Telephone Encounter (Signed)
 Dr. Sheryle would like to speak to Dr. Okey or DOD covering regarding pt. Please advise.

## 2024-08-11 NOTE — Telephone Encounter (Signed)
 I was given this notification at 1230.  I called the office at 1:20 PM and got a voice message saying that the office was closed. Will need to forward to Dr. Okey or DOD for tomorrow.  .mec

## 2024-08-11 NOTE — Telephone Encounter (Signed)
 I returned Hailey Graham's call from [facility] regarding Xarelto .  No answer.  Left her a message to call back tomorrow and ask for coumadin clinic.

## 2024-08-12 ENCOUNTER — Ambulatory Visit: Payer: Self-pay | Admitting: Internal Medicine

## 2024-08-13 DIAGNOSIS — D72829 Elevated white blood cell count, unspecified: Secondary | ICD-10-CM | POA: Diagnosis not present

## 2024-08-13 DIAGNOSIS — D500 Iron deficiency anemia secondary to blood loss (chronic): Secondary | ICD-10-CM | POA: Diagnosis not present

## 2024-08-13 DIAGNOSIS — C50412 Malignant neoplasm of upper-outer quadrant of left female breast: Secondary | ICD-10-CM | POA: Diagnosis not present

## 2024-08-13 DIAGNOSIS — C787 Secondary malignant neoplasm of liver and intrahepatic bile duct: Secondary | ICD-10-CM | POA: Diagnosis not present

## 2024-08-17 ENCOUNTER — Encounter: Payer: Self-pay | Admitting: Internal Medicine

## 2024-08-24 ENCOUNTER — Telehealth: Payer: Self-pay | Admitting: *Deleted

## 2024-08-24 DIAGNOSIS — Z79899 Other long term (current) drug therapy: Secondary | ICD-10-CM

## 2024-08-24 MED ORDER — POTASSIUM CHLORIDE ER 10 MEQ PO TBCR
EXTENDED_RELEASE_TABLET | ORAL | 11 refills | Status: AC
Start: 2024-08-24 — End: ?

## 2024-08-24 MED ORDER — FUROSEMIDE 40 MG PO TABS
ORAL_TABLET | ORAL | 11 refills | Status: AC
Start: 2024-08-24 — End: ?

## 2024-08-24 NOTE — Telephone Encounter (Signed)
 She could try lasix  40 mg with 10 KCL a couple times per week (every 3rd day) FOllow up BMET and BNP in 2 wks

## 2024-08-24 NOTE — Telephone Encounter (Signed)
 Orders faxed to [facility] at [phone].

## 2024-08-24 NOTE — Telephone Encounter (Signed)
 Received a call to [facility] with Dr. Marily office. Pt reports that she is still having a large amount swelling to legs. Romero reports that the pt is elevating legs as suggested. She reports that the swelling is down in the morning and increasing through out the day. Please advise.

## 2024-09-07 DIAGNOSIS — R600 Localized edema: Secondary | ICD-10-CM | POA: Diagnosis not present

## 2024-09-09 ENCOUNTER — Telehealth: Payer: Self-pay | Admitting: Internal Medicine

## 2024-09-09 DIAGNOSIS — R609 Edema, unspecified: Secondary | ICD-10-CM

## 2024-09-09 DIAGNOSIS — Z79899 Other long term (current) drug therapy: Secondary | ICD-10-CM

## 2024-09-09 NOTE — Telephone Encounter (Signed)
 Pt c/o medication issue:  1. Name of Medication:   furosemide  (LASIX ) 40 MG tablet  potassium chloride  (KLOR-CON ) 10 MEQ tablet   2. How are you currently taking this medication (dosage and times per day)? Not taking  3. Are you having a reaction (difficulty breathing--STAT)? no  4. What is your medication issue? Pt is confused about if she was suppose to be taking it or not. Please call after 1:00. Would like to speak to nurse [facility]

## 2024-09-09 NOTE — Telephone Encounter (Signed)
 She can try lasix  80 (2 40 mg tabs) every other day for 3 additional doses   Double up on Potassium on those days Follow up BMET and BNP in 2 wks

## 2024-09-09 NOTE — Telephone Encounter (Signed)
 Spoke with pt who states that the lasix  is not doing a lot of good. She reports that she is still has some swelling in the lower extremities after getting up for the day. She does wear support stocking and elevates her legs. Please advise.

## 2024-09-10 ENCOUNTER — Telehealth: Payer: Self-pay | Admitting: Internal Medicine

## 2024-09-10 NOTE — Telephone Encounter (Signed)
 Returned call to nurse. No answer. Unable to leave msg d/t mailbox being full.

## 2024-09-10 NOTE — Telephone Encounter (Signed)
 Pt notified of med changes

## 2024-09-10 NOTE — Telephone Encounter (Signed)
 [facility] notified of medication change and the need for labs in 2 weeks via Fax.

## 2024-09-10 NOTE — Telephone Encounter (Signed)
 See previous note

## 2024-09-10 NOTE — Telephone Encounter (Signed)
 Pt c/o medication issue:  1. Name of Medication:   furosemide  (LASIX ) 40 MG tablet   potassium chloride  (KLOR-CON ) 10 MEQ tablet  2. How are you currently taking this medication (dosage and times per day)?   Take one tablet ( 40 mg ) every 3rd day    3. Are you having a reaction (difficulty breathing--STAT)? No  4. What is your medication issue? Wants more clarification on dosage change

## 2024-09-10 NOTE — Telephone Encounter (Signed)
 Returned call to Dixon at [place]. Verbal order given for Lasix  80 mg (2 of 40 mg tablets) every other day for 3 additional doses. Double up on Potassium on those days. Follow up BMET and BNP in 2 wks.

## 2024-09-10 NOTE — Telephone Encounter (Signed)
 Landon at [facility] callings needs actually order for medication changes. Asap because she is leave at 4

## 2024-09-16 DIAGNOSIS — I480 Paroxysmal atrial fibrillation: Secondary | ICD-10-CM | POA: Diagnosis not present

## 2024-09-16 DIAGNOSIS — C180 Malignant neoplasm of cecum: Secondary | ICD-10-CM | POA: Diagnosis not present

## 2024-09-16 DIAGNOSIS — R601 Generalized edema: Secondary | ICD-10-CM | POA: Diagnosis not present

## 2024-09-16 DIAGNOSIS — Z853 Personal history of malignant neoplasm of breast: Secondary | ICD-10-CM | POA: Diagnosis not present

## 2024-09-18 ENCOUNTER — Other Ambulatory Visit: Payer: Self-pay | Admitting: Student

## 2024-09-18 DIAGNOSIS — I480 Paroxysmal atrial fibrillation: Secondary | ICD-10-CM

## 2024-09-20 NOTE — Telephone Encounter (Signed)
 Prescription refill request for Xarelto  received.  Indication: a fib Last office visit: 06/17/24 Weight: 160# Age: 89 Scr: 0.8 epic 08/10/24 CrCl:  52ml/min.   Cr Cl slightly low. Will keep on 20mg  for now with 90 day supply.

## 2024-09-21 DIAGNOSIS — L600 Ingrowing nail: Secondary | ICD-10-CM | POA: Diagnosis not present

## 2024-09-21 DIAGNOSIS — M79674 Pain in right toe(s): Secondary | ICD-10-CM | POA: Diagnosis not present

## 2024-09-21 DIAGNOSIS — M79675 Pain in left toe(s): Secondary | ICD-10-CM | POA: Diagnosis not present

## 2024-09-24 DIAGNOSIS — R601 Generalized edema: Secondary | ICD-10-CM | POA: Diagnosis not present

## 2024-10-04 DIAGNOSIS — L820 Inflamed seborrheic keratosis: Secondary | ICD-10-CM | POA: Diagnosis not present

## 2024-10-04 DIAGNOSIS — Z08 Encounter for follow-up examination after completed treatment for malignant neoplasm: Secondary | ICD-10-CM | POA: Diagnosis not present

## 2024-10-04 DIAGNOSIS — L538 Other specified erythematous conditions: Secondary | ICD-10-CM | POA: Diagnosis not present

## 2024-10-04 DIAGNOSIS — Z85828 Personal history of other malignant neoplasm of skin: Secondary | ICD-10-CM | POA: Diagnosis not present

## 2024-10-04 DIAGNOSIS — L2989 Other pruritus: Secondary | ICD-10-CM | POA: Diagnosis not present

## 2024-10-04 DIAGNOSIS — L570 Actinic keratosis: Secondary | ICD-10-CM | POA: Diagnosis not present

## 2024-11-01 ENCOUNTER — Telehealth: Payer: Self-pay | Admitting: Internal Medicine

## 2024-11-01 NOTE — Telephone Encounter (Signed)
 Patient is requesting a call back from [place]. She says she has updates she would like to discuss directly with her. Please advise.

## 2024-11-01 NOTE — Telephone Encounter (Signed)
 Questioning if her 11/08/2024 appointment with Okey is necessary. Explained in detail that she is on a 6 month follow up routine to review conditions symptoms, and treatment plan. Agreed to keep appointment with Dr. Okey.

## 2024-11-06 NOTE — Progress Notes (Unsigned)
 Cardiology Office Note   Date:  11/08/2024   ID:  Hailey Graham, DOB 1928-02-16, MRN [id number]  PCP:  Sheryle Carwin, MD  Cardiologist:   Vina Gull, MD       History of Present Illness: Hailey Graham is a 89 y.o. female with a history of PAF, HL, breast cancer and LE edema    I saw her in June 2025   At that visit she had LE edema    Labs showed severe anemia  Sent to ER   Tx 2U prbc.  Admitted   Found to have cecal mass   Went on to have R hemicolectomy on 06/28/24 with liver Bx   Bx positive for metastatic adeno Ca Initially patient placed on hospice  She was taken off of all meds    She is out of hospice and is taking some meds including Xarelto    Today the pt says she is feeling good  Pt denies CP  Breathing is OK  No dizziness  No palpitaitons  Leg edema is better without taking lasix     Current Meds  Medication Sig   anastrozole  (ARIMIDEX ) 1 MG tablet Take 1 tablet (1 mg total) by mouth daily.   Cyanocobalamin (B-12) 1000 MCG TABS Take 1 tablet by mouth daily.   dorzolamide -timolol  (COSOPT ) 22.3-6.8 MG/ML ophthalmic solution Place 1 drop into both eyes 2 (two) times daily.   ferrous sulfate 325 (65 FE) MG tablet Take 325 mg by mouth daily. Mon, Wed, Fri.   latanoprost  (XALATAN ) 0.005 % ophthalmic solution Place 1 drop into both eyes at bedtime.   metoprolol  succinate (TOPROL -XL) 25 MG 24 hr tablet TAKE (1/2) TABLET BY MOUTH TWICE DAILY   ondansetron  (ZOFRAN -ODT) 4 MG disintegrating tablet Take 4 mg by mouth every 6 (six) hours as needed for nausea or vomiting.   pantoprazole  (PROTONIX ) 40 MG tablet Take 1 tablet (40 mg total) by mouth daily.   pilocarpine  (PILOCAR) 1 % ophthalmic solution Place 1 drop into the right eye 4 times daily.   rivaroxaban  (XARELTO ) 20 MG TABS tablet TAKE 1 TABLET BY MOUTH EVERY DAY WITH SUPPER     Allergies:   Brimonidine, Sulfa drugs cross reactors, Codeine, Morphine, and Sulfamethoxazole   Past Medical History:  Diagnosis Date   Allergy     Anxiety    Arthritis    Breast cancer of upper-outer quadrant of left female breast (HCC) 11/24/2015   Cataract    Complication of anesthesia    Depression    Diverticulosis    Gait abnormality    Gastroesophageal reflux disease    Glaucoma    Hyperlipidemia    Lipid profile in 05/2010:149, 86, 48, 84   Lung nodule    Right lower lobe; stable in 2010   Paroxysmal atrial fibrillation (HCC)    Rate related right bundle branch block; normal EF on echo in 2006; normal stress nuclear-2006; mild RV hypokinesis on MRI;  RA and RV enlargement on CT in 9/06; Anticoagulation->discontinued in 2009   Personal history of kidney stones    PONV (postoperative nausea and vomiting)    Nausea    Past Surgical History:  Procedure Laterality Date   ABDOMINAL HYSTERECTOMY  2202   ANKLE SURGERY Right 12/23/2001   Fracture- rod   APPENDECTOMY     as a teenager   BREAST LUMPECTOMY Left 2016   invasive ductal ca   BREAST LUMPECTOMY WITH RADIOACTIVE SEED LOCALIZATION Left 12/13/2015   Procedure: BREAST LUMPECTOMY WITH RADIOACTIVE SEED  LOCALIZATION;  Surgeon: Morene Olives, MD;  [facility]: [facility] OR;  Service: General;  Laterality: Left;   CATARACT EXTRACTION W/ INTRAOCULAR LENS IMPLANT Right 12/23/2012   COLONOSCOPY N/A 06/25/2024   Procedure: COLONOSCOPY;  Surgeon: Shaaron Lamar HERO, MD;  Location: [facility] ENDO SUITE;  Service: Endoscopy;  Laterality: N/A;   COLONOSCOPY W/ POLYPECTOMY     COLOSTOMY REVISION Right 06/28/2024   Procedure: RIGHT HEMI-COLECTOMY WITH  LIVER BIOPSY;  Surgeon: Kallie Manuelita BROCKS, MD;  Location: [facility] ORS;  Service: General;  Laterality: Right;   ESOPHAGOGASTRODUODENOSCOPY     ESOPHAGOGASTRODUODENOSCOPY N/A 06/25/2024   Procedure: EGD (ESOPHAGOGASTRODUODENOSCOPY);  Surgeon: Shaaron Lamar HERO, MD;  Location: [facility] END[facility] SU[facility];  Service: Endoscopy;  Laterality: N/A;   GLAUCOMA SURGERY Right 12/23/2012   HERNIA REPAIR Right    1990's   ORIF ANKLE FRACTURE  12/23/2001   Right    TONSILLECTOMY     age 52   Varicose veins Right 2207     Social History:  The patient  reports that she has never smoked. She has never used smokeless tobacco. She reports that she does not drink alcohol and does not use drugs.   Family History:  The patient's family history includes Cerebral aneurysm in her father; Healthy in her mother; Lung cancer in her sister.    ROS:  Please see the history of present illness. All other systems are reviewed and  Negative to the above problem except as noted.    PHYSICAL EXAM: VS:  BP (!) 144/83   Pulse 74   Ht 5' 6 (1.676 m)   Wt 154 lb (69.9 kg)   SpO2 97%   BMI 24.86 kg/m   GEN: Well nourished, well developed, in no acute distress  HEENT: normal  Neck: no JVD, carotid bruits Cardiac: RRR; no murmur Respiratory:  clear to auscultation bilaterally,  GI: soft, nontender,   Ext  Lipedema  EKG:  EKG is ordered today.   Lipid Panel    Component Value Date/Time   CHOL 137 07/23/2019 0000   TRIG 97 07/23/2019 0000   HDL 45 07/23/2019 0000   CHOLHDL 2.9 11/12/2017 0845   VLDL 13 11/12/2017 0845   LDLCALC 73 07/23/2019 0000      Wt Readings from Last 3 Encounters:  11/08/24 154 lb (69.9 kg)  08/10/24 160 lb (72.6 kg)  08/02/24 153 lb 9.6 oz (69.7 kg)      ASSESSMENT AND PLAN:  1  PAF  Pt currently on low dose metoprolol  and Xarelto    Will check CBC  Should be checked monthly   If drops signficantly would stop Xarelto    2  HL   Off of Vytorin  given metastatic dz  3  BP   A little high today  Follow    4  LE edema  Improved      Current medicines are reviewed at length with the patient today.  The patient does not have concerns regarding medicines.  Signed, Vina Gull, MD  11/08/2024 1:35 PM    [facility] [address] [place], [place], [place]  [address] Phone: [phone]; Fax: [phone]      CHA2DS2-VASc Score = 3  11/06/2024 09:40 PM Service: Ca  This indicates a 3.2% annual risk of  stroke. The patient's score is based upon: CHF History: 0 HTN History: 0 Diabetes History: 0 Stroke History: 0 Vascular Disease History: 0 Age Score: 2 Gender Score: 1    Physical Exam:   VS:  BP ROLLEN)  144/83   Pulse 74   Ht 5' 6 (1.676 m)   Wt 154 lb (69.9 kg)   SpO2 97%   BMI 24.86 kg/m    Wt Readings from Last 3 Encounters:  11/08/24 154 lb (69.9 kg)  08/10/24 160 lb (72.6 kg)  08/02/24 153 lb 9.6 oz (69.7 kg)     GEN: Pleasant, elderly female appearing in no acute distress NECK: No JVD;  CARDIAC: RRR, no murmurs RESPIRATORY:  Clear to auscultation ABDOMEN: Appears non-distended. No masses EXTREMITIES: Swollen, nonpitting edema  (R greater L)  Assessment and Plan:    +. Paroxysmal Atrial Fibrillation/Use of Long-term Anticoagulation  Pt asymptomatic    I would continue Toprol  XL and Xarelto   Follow periodic labs   2  LE edema    She has soft nonpitting edema of legs   Lungs CTA  Otherwise volume status looks OK Will check labs  Further Rx based on result     3. HL  Continue Vytorin    4  Balance     Will refer back to PT for balance assessment/training   Signed, Vina Gull, MD

## 2024-11-08 ENCOUNTER — Ambulatory Visit: Attending: Internal Medicine | Admitting: Internal Medicine

## 2024-11-08 ENCOUNTER — Ambulatory Visit: Payer: Self-pay | Admitting: Internal Medicine

## 2024-11-08 ENCOUNTER — Encounter: Payer: Self-pay | Admitting: Internal Medicine

## 2024-11-08 ENCOUNTER — Other Ambulatory Visit (HOSPITAL_COMMUNITY)
Admission: RE | Admit: 2024-11-08 | Discharge: 2024-11-08 | Disposition: A | Discharge status: Home / Self Care | Source: Ambulatory Visit | Attending: Internal Medicine | Admitting: Internal Medicine

## 2024-11-08 VITALS — BP 144/83 | HR 74 | Ht 66.0 in | Wt 154.0 lb

## 2024-11-08 DIAGNOSIS — Z79899 Other long term (current) drug therapy: Secondary | ICD-10-CM | POA: Diagnosis present

## 2024-11-08 DIAGNOSIS — I480 Paroxysmal atrial fibrillation: Secondary | ICD-10-CM | POA: Insufficient documentation

## 2024-11-08 DIAGNOSIS — Z7901 Long term (current) use of anticoagulants: Secondary | ICD-10-CM

## 2024-11-08 LAB — CBC
HCT: 40.6 % (ref 36.0–46.0)
Hemoglobin: 12.9 g/dL (ref 12.0–15.0)
MCH: 28.8 pg (ref 26.0–34.0)
MCHC: 31.8 g/dL (ref 30.0–36.0)
MCV: 90.6 fL (ref 80.0–100.0)
Platelets: 239 K/uL (ref 150–400)
RBC: 4.48 MIL/uL (ref 3.87–5.11)
RDW: 15.4 % (ref 11.5–15.5)
WBC: 7.3 K/uL (ref 4.0–10.5)
nRBC: 0 % (ref 0.0–0.2)

## 2024-11-08 NOTE — Patient Instructions (Signed)
 Medication Instructions:  Your physician recommends that you continue on your current medications as directed. Please refer to the Current Medication list given to you today.  *If you need a refill on your cardiac medications before your next appointment, please call your pharmacy*  Lab Work: Your physician recommends that you return for lab work in: Today ( CBC)   Please have this done at [facility] (hours-Monday through Friday from 8:00 am to 4:00 pm except 11:30 am to 12:10 pm)  If you have labs (blood work) drawn today and your tests are completely normal, you will receive your results only by: MyChart Message (if you have MyChart) OR A paper copy in the mail If you have any lab test that is abnormal or we need to change your treatment, we will call you to review the results.  Testing/Procedures: NONE   Follow-Up: At [facility], you and your health needs are our priority.  As part of our continuing mission to provide you with exceptional heart care, our providers are all part of one team.  This team includes your primary Cardiologist (physician) and Advanced Practice Providers or APPs (Physician Assistants and Nurse Practitioners) who all work together to provide you with the care you need, when you need it.  Your next appointment:    April   Provider:   You may see Vina Gull, MD or one of the following Advanced Practice Providers on your designated Care Team:   Laymon Qua, PA-C  [place], [place] Olivia [place], [place]     We recommend signing up for the patient portal called MyChart.  Sign up information is provided on this After Visit Summary.  MyChart is used to connect with patients for Virtual Visits (Telemedicine).  Patients are able to view lab/test results, encounter notes, upcoming appointments, etc.  Non-urgent messages can be sent to your provider as well.   To learn more about what you can do with MyChart, go to [email or web address].    Other Instructions Thank you for choosing [facility]!

## 2024-11-12 NOTE — Telephone Encounter (Signed)
 Okey Vina GAILS, MD to [facility] Triage     11/11/24  3:05 PM Since cecal mass removed and hemoglobin is normlaized, lower risk I wolud still recomm CBC in January to confirm holding since she is on anticoagulation   Vina Okey  S/w the patient, gave the information above. She asked if [facility] in [place] could just draw the CBC in January. Informed her that I would fax the order to [facility] for CBC to be drawn in January. She verbalized understanding.   Faxed orders to [facility]; phone- [phone] Fax- [phone]

## 2024-11-12 NOTE — Telephone Encounter (Signed)
 Pt called back wants you to mail her labs to her at [address] [place], [place] [address].

## 2024-11-15 NOTE — Telephone Encounter (Signed)
 Faxed the orders for CBC in January again- sent it to wrong fax number before  Also mailed cbc results to the patient as requested

## 2024-11-16 ENCOUNTER — Other Ambulatory Visit (HOSPITAL_COMMUNITY): Payer: Self-pay | Admitting: Hematology and Oncology

## 2024-11-16 ENCOUNTER — Other Ambulatory Visit: Payer: Self-pay | Admitting: *Deleted

## 2024-11-16 DIAGNOSIS — C50412 Malignant neoplasm of upper-outer quadrant of left female breast: Secondary | ICD-10-CM

## 2024-11-16 DIAGNOSIS — Z853 Personal history of malignant neoplasm of breast: Secondary | ICD-10-CM

## 2024-11-23 ENCOUNTER — Ambulatory Visit: Payer: Medicare Other | Admitting: Hematology and Oncology

## 2024-11-30 ENCOUNTER — Telehealth: Payer: Self-pay | Admitting: Internal Medicine

## 2024-11-30 NOTE — Telephone Encounter (Signed)
 Spoke to patient who stated that [facility] has been giving her Toprol  XL 12.5 mg only at bedtime. Pt is questioning if this is correct as patient was taking medication twice daily prior to residing at [facility].   Spoke with nurse at facility who stated that they have an order from when pt came to facility that medication was once daily at bedtime.    Please advise correct dosage.

## 2024-11-30 NOTE — Telephone Encounter (Signed)
 Pt c/o medication issue:  1. Name of Medication: metoprolol  succinate (TOPROL -XL) 25 MG 24 hr tablet   2. How are you currently taking this medication (dosage and times per day)? As written  3. Are you having a reaction (difficulty breathing--STAT)? no  4. What is your medication issue?   Pt has concerns on why she was told to only take 12.5 MG of medication a day when she was taking 12.5 MG twice daily before. Please advise.

## 2024-12-01 MED ORDER — METOPROLOL SUCCINATE ER 25 MG PO TB24: 12.5000 mg | ORAL_TABLET | Freq: Two times a day (BID) | ORAL | 3 refills | Status: AC

## 2024-12-01 NOTE — Telephone Encounter (Signed)
 pLease change prescription to 12.5 mg 2x per day

## 2024-12-01 NOTE — Telephone Encounter (Signed)
 Spoke to [place] BANKER) and verbalized providers recommendation to increase Toprol  XL to 12.5 mg BID. Will route message to [facility] as verification

## 2024-12-02 NOTE — Progress Notes (Signed)
 Ophthalmology Department Clinical Visit Note     CHIEF COMPLAINT Patient presents for Follow Up Exam (Vision is about the same. No changes. Taking: Pilocarpine  1% x4 OD/Latan x1 OU/Dorzolamide -Timolol  x2 OU/She has a paper that Dr Geneva needs to sing because her facility doesn't understand how to properly install her drops for her. They want exact instructions written down (which drop, how much time to wait between giving the drops, etc))   HISTORY OF PRESENT ILLNESS: Hailey Graham is a 89 y.o. old female who presents to the clinic today for:   HPI     Follow Up Exam   Follow-Up For::  Capsular glaucoma with pseudoexfoliation of lens  .  In both eyes. Additional comments: Vision is about the same. No changes. Taking: Pilocarpine  1% x4 OD Latan x1 OU Dorzolamide -Timolol  x2 OU She has a paper that Dr Geneva needs to sing because her facility doesn't understand how to properly install her drops for her. They want exact instructions written down (which drop, how much time to wait between giving the drops, etc)      Last edited by Chiquita JINNY Severe, OA on 12/06/2024 12:40 PM.     Phaco/trab/mmc OD 08/26/2017-mg/bb (for IOP 28)       LSL-Superonasal and nasal sutures cut OD 09/04/2017 Referred by Dr Frutoso - High IOP 24 OD, 39 OS on 08/2012  Diagnosed with glaucoma 2001 Allergy to Alphagan   Previously tried Pilocarpine , Timolol    ALT OD 03/14/2015 bb ALT OU - OD 04/2002, OS 2004   Phaco/trab/mmc OS 01/05/2013 - mg/bb (for IOP 33)   Breast CA on oral chemo 2022 - stable   Humphrey visual field interpretation  01/01/2019 Good reliability OU OD: superior arcuate defect, inferior arcuate defect-VFI 81; MD -9.6; worse OS: inferior arcuate defect-VFI 80; MD -9.5; stable  Humphrey visual field interpretation  11/13/2016: Good reliability OU OD: Inferior arcuate defect-VFI 92; MD -4.9 OS: Arcuate and nasal defect in both hemi-fields-VFI 82; MD -6.9     Humphrey visual field interpretation  10/10/2014: Good reliability OU OD: Inferior arcuate-- MD -4.4; slightly worse than prior OS: Arcuate defect in both hemifields, superior paracentral defect-- MD -11.1; slightly worse than prior    Humphrey visual field interpretation 10/11/2013:   Good reliability OU   OD: Probably normal - MD -1.8   OS: Arcuate and nasal defect in both hemifields - MD -8.9  OCT RNFL -  08/02/2024 Reliability:  Good, QI  30 OD, QI  34 OS OD:  Diffusely thin on profile view; sup avg 51, inf avg 40 OS:  Loss of double-hump pattern on profile view; sup avg 65, inf avg 52  OCT RNFL -  07/16/2023 Reliability:  Good, QI  26 OD, QI  25 OS OD:  Diffusely thin on profile view; sup avg 56, inf avg 47 OS:  Loss of double-hump pattern on profile view; sup avg 67, inf avg 53   CURRENT MEDICATIONS: Current Outpatient Medications on File Prior to Visit (Ophthalmic Drugs)  Medication Sig   dorzolamide -timoloL  (COSOPT ) 22.3-6.8 mg/mL ophthalmic solution PLACE 1 DROP INTO BOTH EYES TWO TIMES DAILY. (Patient not taking: Reported on 08/02/2024)   latanoprost  (XALATAN ) 0.005 % ophthalmic solution PUT 1 DROP IN EACH EYE AT BEDTIME.   netarsudiL-latanoprost  (Rocklatan) 0.02-0.005 % drop Administer 1 drop into each eyes Once Daily. (Patient not taking: Reported on 08/02/2024)   pilocarpine  (PILOCAR) 1 % ophthalmic solution INSTILL 1 DROP IN RIGHT EYE FOUR TIMES DAILY   pilocarpine  (PILOCAR) 1 %  ophthalmic solution INSTILL 1 DROP IN RIGHT EYE FOUR TIMES DAILY   Current Outpatient Medications on File Prior to Visit (Other)  Medication Sig   ALPRAZolam  (XANAX ) 0.5 mg tablet Take 0.25 mg by mouth as needed.   anastrozole  (ARIMIDEX ) 1 mg tablet Take 1 mg by mouth Once Daily.   cholecalciferol (VITAMIN D3) 1,000 unit (25 mcg) tablet 1,000 Units Once Daily.   ezetimibe -simvastatin  (Vytorin  10-20) 10-20 mg tab per tablet 1 tablet nightly.   metoprolol  succinate (TOPROL  XL) 25 mg 24 hr tablet 12.5 mg every evening.    pantoprazole  (PROTONIX ) 40 mg EC tablet 40 mg as needed.   rivaroxaban  (Xarelto ) 20 mg tablet 20 mg daily with dinner.    Referring physician: No referring provider defined for this encounter.   ALLERGIES Allergies  Allergen Reactions   Adenosine Other (See Comments)    Atrial fib   Brimonidine Other (See Comments)    Crusting - difficulty opening eyes   Codeine GI Intolerance   Morphine GI Intolerance   Sulfamethoxazole GI Intolerance    PAST MEDICAL HISTORY Past Medical History:  Diagnosis Date   Arthritis    Atrial fibrillation    (CMD)    and RBBB; Dr. Charls    Breast cancer, left    (CMD) 10/2015   only lumpectomy--did not require chemo or radiation   Capsular glaucoma with pseudoexfoliation of lens, left eye, severe stage 10/16/2013   Capsular glaucoma with pseudoexfoliation of lens, right eye, mild stage 10/16/2013   Hypertension    Liver cancer    (CMD)    stage 4 liver cancer   Nuclear sclerotic cataract of right eye 05/28/2017   Open right ankle fracture 2004   PONV (postoperative nausea and vomiting)    but none after 2014 left eye surgery   Primary open angle glaucoma (POAG) of right eye, severe stage 05/29/2017   Pseudophakia 01/05/2013   Dr. Adina Sickles   Past Surgical History:  Procedure Laterality Date   APPENDECTOMY  1951   Procedure: APPENDECTOMY   BREAST LUMPECTOMY  11/2015   Procedure: BREAST LUMPECTOMY   CATARACT EXTRACTION W/  INTRAOCULAR LENS IMPLANT  01/05/2013   Procedure: PHACOEMULSIFICATION W/ IOL;  Surgeon: Donnice Sickles, MD;  Location: [facility] OUTPATIENT OR;  Service: Ophthalmology;  Laterality: Left;   CATARACT EXTRACTION W/  INTRAOCULAR LENS IMPLANT Right 08/26/2017   Procedure: PHACOEMULSIFICATION PC / IOL GMO;  Surgeon: Donnice Sickles, MD;  Location: [facility] OUTPATIENT OR;  Service: Ophthalmology;  Laterality: Right;   HYSTERECTOMY   2010   Procedure: HYSTERECTOMY   LASER TREATMENT  2003/2004    Procedure: LASER TREATMENT; both eyes   ORIF ANKLE FRACTURE Right 2004   Procedure: ORIF ANKLE FRACTURE   TONSILLECTOMY  1937   Procedure: TONSILLECTOMY   TRABECULECTOMY  01/05/2013   Procedure: TRABECULECTOMY;  Surgeon: Reyes Thresa Bracket, MD;  Location: [facility] OUTPATIENT OR;  Service: Ophthalmology;  Laterality: Left;  mitomycin   TRABECULECTOMY Right 08/26/2017   Procedure: TRABECULECTOMY;  Surgeon: Reyes Thresa Bracket, MD;  Location: [facility] OUTPATIENT OR;  Service: Ophthalmology;  Laterality: Right;  outpatient, combined with phaco by dr sickles, mitomycin, ASA 2    FAMILY HISTORY Family History  Problem Relation Name Age of Onset   Heart failure Brother         AMI   Cancer Brother         lung cancer   Cancer Sister         lung cancer   Amblyopia Neg Hx  Blindness Neg Hx     Cataracts Neg Hx     Diabetes Neg Hx     Glaucoma Neg Hx     Hypertension Neg Hx     Macular degeneration Neg Hx     Retinal detachment Neg Hx     Strabismus Neg Hx     Stroke Neg Hx     Thyroid  disease Neg Hx      SOCIAL HISTORY Social History   Tobacco Use   Smoking status: Never   Smokeless tobacco: Never  Substance Use Topics   Alcohol use: No   Drug use: No        OPHTHALMIC EXAM:  Base Eye Exam     Visual Acuity (Snellen - Linear)       Right Left   Dist cc 20/25 20/30 +2    Correction: Glasses         Tonometry (Applanation, 12:57 PM)       Right Left   Pressure 14 15         Neuro/Psych     Oriented x3: Yes   Mood/Affect: Normal           Slit Lamp and Fundus Exam     External Exam       Right Left   External Normal Normal         Slit Lamp Exam       Right Left   Lids/Lashes Normal Normal   Conjunctiva/Sclera No bleb, needleable No bleb, needleable   Cornea Clear Clear   Anterior Chamber Deep and quiet Deep and quiet   Iris PI, PSXF PSXF   Lens PC IOL PC IOL            IMAGING AND PROCEDURES:  @EYRES @      ASSESSMENT:  1. Capsular glaucoma with pseudoexfoliation of lens, left eye, severe stage      2. Capsular glaucoma with pseudoexfoliation of lens, right eye, mild stage       Patient is tolerating and taking medication as prescribed. Patient reports hemoglobin now 12.9. IOP good OU. Continue drops the same - with drops being spaced 10 minutes. Recheck in 4-5 months  PLAN:   Pilocarpine  1% x4 OD Latan x1 OU Dorzolamide -Timolol  x2 OU  Reviewed proper drop instillation technique   Return in about 4 months (around 04/06/2025).  Ophthalmic Meds Ordered this visit:  There were no meds ordered this visit.      Patient Instructions   DAILY DRUG REMINDER   Wait 10 minutes between drops!!! (Keep eyes closed for 2 MINUTES after each drop)     Drug EYE R = Right L = Left B = Both Breakfast Lunch Supper Bedtime Time Frame    Latanoprost  (Teal green top)  B       X    Pilocarpine  1% (Green top)  R X X X X    Dorzolamide -Timolol  *Cosopt * (Dark blue top)   B X   X   Take about 12 hours apart           HOW TO TAKE EYEDROPS 1.   Make sure you understand when, and exactly how often, to take your eyedrop medicine.  Preferably, learn the names of your medicines, or at least be able to identify your eyedrops by the color of the cap of the bottle (e.g., I take the purple top 2 times a day in the right eye and the yellow top 1 time a day in both eyes.).  2.  Always wash your hands prior to instilling your eyedrops. 3.  If you are putting drops in both eyes, then sit down or lie down to put in your drops.  This is because it is important to close the eye getting drops for 2 minutes immediately after instilling the drop (this markedly improves absorption).  If you are putting the drops in both eyes, then you will be closing both eyes for at least 2 minutes, therefore you should be sitting or lying down.  If you must stand to put in the drops (need to look in mirror), then have a chair  next to you so that you can sit just after instilling the drops and close your eyes for 2 minutes.  If you are only instilling an eyedrop in one eye, then you only have to close that eye after the drop, and therefore you dont necessarily have to sit or lie. 4.  If you are taking more than one drop at a time in the same eye, then you must separate the drops by at least 10 minutes. 5.  To put in the drop, hold the bottle in your dominant hand between your forefinger and thumb and invert the bottle over the targeted eye.  Your head should be tilted back and you should roll your eyes upward. 6.  With your other hand, pull the lower eyelid down and try to instill the drop in the pouch made by pulling the lid down, or on the lower part of the eye ball.  Position the bottle close to the eye, but do not touch the eye with the dropper tip.  Squeeze the sides of the bottle and a drop of the medicine will come out. 7.  You will feel the drop when it contacts the eye, and then you should immediately close the eye, or if you are putting the drop in both eyes, move your hands immediately to the other eye, and place the drop in it, and then close both eyes as recommended in #2 above. 8.  While the eye/s are closed, use a tissue to wipe away any medication that remains on the lid or lashes.  You may do this during the 2 minutes that the eye remains closed, just do not open the eye to wipe it. 9.  Always remember to put the cap back on the bottle and store the bottle away from pets and not in direct sunlight.  The bottle does not have to be refrigerated, but it may be if that is preferred (some people prefer feeling the cooled eyedrop on the eye). 10.  Never stop the drops without conferring with your doctor first, even if you feel that the drops are not helping your eye.  If you believe you are experiencing side-effects from the eyedrops, then discuss that, or any issues or concerns,  with your doctor.      This  document serves as a record of services personally performed by J. Thresa Bracket. It was created on their behalf by My Rayleen, a trained medical scribe. The creation of this record is the providers dictation and/or activities during the visit. I agree the documentation is accurate and complete.  Electronically signed by: JINNY Thresa Bracket, MD 12/13/2024 9:38 AM

## 2024-12-09 ENCOUNTER — Ambulatory Visit (HOSPITAL_COMMUNITY)

## 2024-12-09 ENCOUNTER — Encounter (HOSPITAL_COMMUNITY)

## 2024-12-11 ENCOUNTER — Other Ambulatory Visit: Payer: Self-pay | Admitting: Internal Medicine

## 2024-12-11 DIAGNOSIS — I480 Paroxysmal atrial fibrillation: Secondary | ICD-10-CM

## 2024-12-13 ENCOUNTER — Other Ambulatory Visit: Payer: Self-pay | Admitting: *Deleted

## 2024-12-13 NOTE — Telephone Encounter (Signed)
 Prescription refill request for Xarelto  received.  Indication: PAF Last office visit: 11/08/24  SHAUNNA Gull MD Weight: 69.9kg Age: 89 Scr: 0.83 on 09/07/24  Epic CrCl: 45.73  Based on above findings Xarelto  15mg  daily is the appropriate dose.  Pt is on Xarelto  20mg  daily.  Message sent to PharmD Pool to advise on dose reduction.

## 2024-12-13 NOTE — Telephone Encounter (Signed)
 12/13/24 Called pt.  No answer.  No voice mail set up.  Will call back later.

## 2024-12-13 NOTE — Telephone Encounter (Signed)
 Spoke with pt to discuss recommendation by PharmD to decrease Xarelto  to 15mg  daily due to CrCl.  Unable to reason with patient.  She questioned everything we discussed.  Told her I would send a message to Dr Okey to call her and discuss.  Pt did not want me to send in new Rx to requesting Pharmacy until after she talks to Nurse at [facility] in the morning.

## 2024-12-13 NOTE — Progress Notes (Unsigned)
 Pt called me back.  I discussed with pt the need to decrease Xarelto  to 15mg  daily based on CrCl.  Pt very apprehensive to suggestions and reasoning.  I could not reason with her.  Informed I would have Dr Okey call her when she's available.  Pt is at [facility] now.  She will let me know in the morning if she wants me to send refill into Pharmacy after she talks to RN at [facility].

## 2024-12-13 NOTE — Telephone Encounter (Signed)
 Patient returned RN's call.

## 2025-01-03 ENCOUNTER — Telehealth: Payer: Self-pay | Admitting: Internal Medicine

## 2025-01-03 NOTE — Telephone Encounter (Signed)
 Caller Kandra) stated [facility] had requested orders for patient to have a hospital bed and is following up on the status of that request.

## 2025-01-07 NOTE — Telephone Encounter (Signed)
 Hailey Graham from [facility] called to f/u on hospital bed order. Advised that Dr. Okey needs to complete DME form for hospital bed and fax back to [facility].   Please have Dr. Okey complete form and fax back. Form scanned into pt's chart.

## 2025-01-12 ENCOUNTER — Telehealth: Payer: Self-pay

## 2025-01-12 NOTE — Telephone Encounter (Signed)
 Awaiting DME form and signature from provider

## 2025-01-12 NOTE — Telephone Encounter (Signed)
 Office is calling back for update and would like a call back today. Please advise

## 2025-01-12 NOTE — Telephone Encounter (Signed)
 From Dr. Okey: I saw Hailey Graham this fall Tammy is a form to fill out in media I signed the cover sheet Can it be filled in and stamped (trying to get hospital bed    [facility] triage, please print, complete and use stamp in Dr. Okey' mailbox then fax to [facility].

## 2025-01-13 NOTE — Telephone Encounter (Signed)
 Paperwork printed, completed and placed in Dr Okey' box to be signed in the correct, highlighted location.  Will need to be faxed to [facility] once signed [phone]. Dr Okey however is not back in the office until 01/20/2025.

## 2025-01-18 NOTE — Telephone Encounter (Signed)
 Spoke with Vina at [facility]. Facility was made aware again that Dr Okey was out until 1/29. Will send to Dr Okey and covering for Thursday to see if she can get this signed. Verbalizes understanding of plan and that someone would be in touch.

## 2025-01-18 NOTE — Telephone Encounter (Signed)
 Facility that pt resides calling to f/u on order for hospital bed. Facility states that this has been an ongoing matter since November and have yet to have order/ paperwork signed. Facility was made aware that provider is out of the office until 1/29. Please advise

## 2025-01-19 ENCOUNTER — Telehealth: Payer: Self-pay | Admitting: Internal Medicine

## 2025-01-19 NOTE — Telephone Encounter (Signed)
 Orders to [facility] were faxed to [facility] and to [facility] and scanned into chart.

## 2025-01-26 NOTE — Telephone Encounter (Signed)
 Pt states received bed but it is way too big. [name] states that she called company and the company advised her to call you and get a new prescription sent over for a smaller bed. Mentions the first bed she received 25'   Pt states can call [facility] to relay the message if she is unable to answer the phone because she doesn't not have a voicemail set up option.   Pt would like a c/b regarding this matter, please advise.
